# Patient Record
Sex: Female | Born: 1965 | Race: White | Hispanic: No | Smoking: Former smoker
Health system: Southern US, Community
[De-identification: ages and names within clinical notes are randomized; demographics above are authoritative.]

## PROBLEM LIST (undated history)

## (undated) DIAGNOSIS — A6000 Herpesviral infection of urogenital system, unspecified: Secondary | ICD-10-CM

## (undated) DIAGNOSIS — E079 Disorder of thyroid, unspecified: Secondary | ICD-10-CM

## (undated) DIAGNOSIS — C44319 Basal cell carcinoma of skin of other parts of face: Secondary | ICD-10-CM

## (undated) DIAGNOSIS — J302 Other seasonal allergic rhinitis: Secondary | ICD-10-CM

## (undated) DIAGNOSIS — M359 Systemic involvement of connective tissue, unspecified: Secondary | ICD-10-CM

## (undated) DIAGNOSIS — E039 Hypothyroidism, unspecified: Secondary | ICD-10-CM

## (undated) DIAGNOSIS — R55 Syncope and collapse: Secondary | ICD-10-CM

## (undated) HISTORY — PX: ABDOMINAL HYSTERECTOMY: SHX81

## (undated) HISTORY — PX: BLADDER REPAIR: SHX76

## (undated) HISTORY — DX: Basal cell carcinoma of skin of other parts of face: C44.319

## (undated) HISTORY — DX: Disorder of thyroid, unspecified: E07.9

## (undated) HISTORY — DX: Systemic involvement of connective tissue, unspecified: M35.9

## (undated) HISTORY — DX: Other seasonal allergic rhinitis: J30.2

## (undated) HISTORY — PX: TOOTH EXTRACTION: SUR596

## (undated) HISTORY — DX: Herpesviral infection of urogenital system, unspecified: A60.00

## (undated) HISTORY — DX: Syncope and collapse: R55

## (undated) HISTORY — PX: NASAL SEPTUM SURGERY: SHX37

---
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
## 1993-10-28 HISTORY — PX: BREAST ENHANCEMENT SURGERY: SHX7

## 1998-10-28 HISTORY — PX: AUGMENTATION MAMMAPLASTY: SUR837

## 2001-10-26 ENCOUNTER — Other Ambulatory Visit: Admission: RE | Admit: 2001-10-26 | Discharge: 2001-10-26 | Payer: Self-pay | Admitting: Family Medicine

## 2006-04-17 ENCOUNTER — Ambulatory Visit: Payer: Self-pay | Admitting: Otolaryngology

## 2007-11-16 ENCOUNTER — Ambulatory Visit: Payer: Self-pay | Admitting: Family Medicine

## 2008-03-29 ENCOUNTER — Ambulatory Visit: Payer: Self-pay | Admitting: Internal Medicine

## 2008-03-31 ENCOUNTER — Other Ambulatory Visit: Payer: Self-pay

## 2008-03-31 ENCOUNTER — Emergency Department: Payer: Self-pay | Admitting: Internal Medicine

## 2008-04-25 ENCOUNTER — Encounter: Admission: RE | Admit: 2008-04-25 | Discharge: 2008-04-25 | Payer: Self-pay | Admitting: Gastroenterology

## 2008-07-13 DIAGNOSIS — Z720 Tobacco use: Secondary | ICD-10-CM | POA: Insufficient documentation

## 2008-07-13 DIAGNOSIS — A6000 Herpesviral infection of urogenital system, unspecified: Secondary | ICD-10-CM | POA: Insufficient documentation

## 2008-09-09 DIAGNOSIS — F4320 Adjustment disorder, unspecified: Secondary | ICD-10-CM | POA: Insufficient documentation

## 2009-01-30 ENCOUNTER — Encounter: Payer: Self-pay | Admitting: Pulmonary Disease

## 2009-01-30 DIAGNOSIS — G4730 Sleep apnea, unspecified: Secondary | ICD-10-CM | POA: Insufficient documentation

## 2009-02-01 DIAGNOSIS — J329 Chronic sinusitis, unspecified: Secondary | ICD-10-CM | POA: Insufficient documentation

## 2009-02-10 DIAGNOSIS — M129 Arthropathy, unspecified: Secondary | ICD-10-CM | POA: Insufficient documentation

## 2009-08-28 DIAGNOSIS — E559 Vitamin D deficiency, unspecified: Secondary | ICD-10-CM | POA: Insufficient documentation

## 2009-08-28 DIAGNOSIS — E039 Hypothyroidism, unspecified: Secondary | ICD-10-CM | POA: Insufficient documentation

## 2009-09-18 ENCOUNTER — Ambulatory Visit: Payer: Self-pay | Admitting: Gastroenterology

## 2009-09-20 ENCOUNTER — Ambulatory Visit: Payer: Self-pay | Admitting: Gastroenterology

## 2009-10-28 HISTORY — PX: RECTOPEXY: SHX5700

## 2009-11-20 ENCOUNTER — Ambulatory Visit: Payer: Self-pay | Admitting: Gastroenterology

## 2010-02-05 ENCOUNTER — Ambulatory Visit: Payer: Self-pay | Admitting: Otolaryngology

## 2010-02-25 ENCOUNTER — Ambulatory Visit: Payer: Self-pay | Admitting: Otolaryngology

## 2010-03-06 ENCOUNTER — Ambulatory Visit: Payer: Self-pay | Admitting: Family Medicine

## 2010-10-28 HISTORY — PX: ANTERIOR CRUCIATE LIGAMENT REPAIR: SHX115

## 2010-11-29 NOTE — Miscellaneous (Signed)
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 

## 2011-05-15 ENCOUNTER — Ambulatory Visit: Payer: Self-pay | Admitting: Obstetrics and Gynecology

## 2011-05-16 ENCOUNTER — Ambulatory Visit: Payer: Self-pay | Admitting: Obstetrics and Gynecology

## 2011-05-21 LAB — PATHOLOGY REPORT

## 2011-10-29 HISTORY — PX: ENDOMETRIAL ABLATION: SHX621

## 2011-10-29 HISTORY — PX: CYSTOCELE REPAIR: SHX163

## 2011-10-29 HISTORY — PX: ARTHROSCOPIC REPAIR ACL: SUR80

## 2012-03-24 DIAGNOSIS — N393 Stress incontinence (female) (male): Secondary | ICD-10-CM | POA: Insufficient documentation

## 2012-06-09 ENCOUNTER — Ambulatory Visit: Payer: Self-pay

## 2012-10-28 HISTORY — PX: PARTIAL HYSTERECTOMY: SHX80

## 2013-10-28 HISTORY — PX: PARTIAL HYSTERECTOMY: SHX80

## 2014-04-11 ENCOUNTER — Emergency Department: Payer: Self-pay | Admitting: Emergency Medicine

## 2014-04-11 LAB — CBC WITH DIFFERENTIAL/PLATELET
Basophil #: 0 10*3/uL (ref 0.0–0.1)
Basophil %: 0.6 %
Eosinophil #: 0.1 10*3/uL (ref 0.0–0.7)
Eosinophil %: 1 %
HCT: 42.1 % (ref 35.0–47.0)
HGB: 14.3 g/dL (ref 12.0–16.0)
Lymphocyte #: 2.1 10*3/uL (ref 1.0–3.6)
Lymphocyte %: 26.9 %
MCH: 32 pg (ref 26.0–34.0)
MCHC: 33.9 g/dL (ref 32.0–36.0)
MCV: 94 fL (ref 80–100)
Monocyte #: 0.6 x10 3/mm (ref 0.2–0.9)
Monocyte %: 7.3 %
Neutrophil #: 4.9 10*3/uL (ref 1.4–6.5)
Neutrophil %: 64.2 %
Platelet: 289 10*3/uL (ref 150–440)
RBC: 4.46 10*6/uL (ref 3.80–5.20)
RDW: 12.5 % (ref 11.5–14.5)
WBC: 7.7 10*3/uL (ref 3.6–11.0)

## 2014-04-11 LAB — URINALYSIS, COMPLETE
Bilirubin,UR: NEGATIVE
Blood: NEGATIVE
Glucose,UR: NEGATIVE mg/dL (ref 0–75)
Ketone: NEGATIVE
Leukocyte Esterase: NEGATIVE
Nitrite: NEGATIVE
Ph: 8 (ref 4.5–8.0)
Protein: NEGATIVE
RBC,UR: NONE SEEN /HPF (ref 0–5)
Specific Gravity: 1.005 (ref 1.003–1.030)
Squamous Epithelial: <1
WBC UR: 1 /HPF (ref 0–5)

## 2014-04-11 LAB — COMPREHENSIVE METABOLIC PANEL
Albumin: 3.9 g/dL (ref 3.4–5.0)
Alkaline Phosphatase: 61 U/L
Anion Gap: 7 (ref 7–16)
BUN: 6 mg/dL — ABNORMAL LOW (ref 7–18)
Bilirubin,Total: 0.2 mg/dL (ref 0.2–1.0)
Calcium, Total: 8.9 mg/dL (ref 8.5–10.1)
Chloride: 104 mmol/L (ref 98–107)
Co2: 27 mmol/L (ref 21–32)
Creatinine: 0.63 mg/dL (ref 0.60–1.30)
EGFR (African American): >60
EGFR (Non-African Amer.): >60
Glucose: 78 mg/dL (ref 65–99)
Osmolality: 272 (ref 275–301)
Potassium: 3.8 mmol/L (ref 3.5–5.1)
SGOT(AST): 13 U/L — ABNORMAL LOW (ref 15–37)
SGPT (ALT): 20 U/L (ref 12–78)
Sodium: 138 mmol/L (ref 136–145)
Total Protein: 7.1 g/dL (ref 6.4–8.2)

## 2014-04-21 ENCOUNTER — Encounter: Payer: Self-pay | Admitting: Obstetrics and Gynecology

## 2014-05-10 ENCOUNTER — Encounter: Payer: Self-pay | Admitting: Obstetrics and Gynecology

## 2014-05-27 ENCOUNTER — Ambulatory Visit: Payer: Self-pay | Admitting: Gastroenterology

## 2014-05-28 ENCOUNTER — Encounter: Payer: Self-pay | Admitting: Obstetrics and Gynecology

## 2014-06-28 ENCOUNTER — Encounter: Payer: Self-pay | Admitting: Obstetrics and Gynecology

## 2014-08-31 DIAGNOSIS — K5909 Other constipation: Secondary | ICD-10-CM | POA: Insufficient documentation

## 2014-09-29 ENCOUNTER — Ambulatory Visit: Payer: Self-pay | Admitting: Gastroenterology

## 2014-11-24 ENCOUNTER — Ambulatory Visit: Payer: Self-pay | Admitting: Family Medicine

## 2015-04-19 ENCOUNTER — Ambulatory Visit (INDEPENDENT_AMBULATORY_CARE_PROVIDER_SITE_OTHER): Payer: 59 | Admitting: Family Medicine

## 2015-04-19 ENCOUNTER — Encounter: Payer: Self-pay | Admitting: Family Medicine

## 2015-04-19 VITALS — BP 103/69 | HR 69 | Resp 16 | Ht 62.0 in | Wt 134.0 lb

## 2015-04-19 DIAGNOSIS — R61 Generalized hyperhidrosis: Secondary | ICD-10-CM | POA: Diagnosis not present

## 2015-04-19 DIAGNOSIS — A6000 Herpesviral infection of urogenital system, unspecified: Secondary | ICD-10-CM | POA: Diagnosis not present

## 2015-04-19 DIAGNOSIS — G4700 Insomnia, unspecified: Secondary | ICD-10-CM

## 2015-04-19 DIAGNOSIS — F528 Other sexual dysfunction not due to a substance or known physiological condition: Secondary | ICD-10-CM | POA: Diagnosis not present

## 2015-04-19 NOTE — Assessment & Plan Note (Signed)
Not menopausal according to most recent test in January of this year. Will check other labs.

## 2015-04-19 NOTE — Progress Notes (Signed)
    Subjective:    Patient ID: Theresa Walton is a 49 y.o. female presenting with Hormone Issue  on 04/19/2015  HPI: Reports that she is feeling her hormones are out of wack.  She has had a TVH, Cystocele and rectocele repair. She reports loss of sleep, exhausted all day long. Increased sex drive. She is single x 4 years.denies hot flashes, but her core temperature is higher, but has night sweats which are not new. Previous endometrial ablation. Tender breasts x 5 years. Reports mood swings have been increasingly bad. Someone has told her to try compounded hormones. Reports normal [place] in 1/16. Normal TSH in 10/15.  Review of Systems  Constitutional: Negative for fever and chills.  Respiratory: Negative for shortness of breath.   Cardiovascular: Negative for chest pain.  Gastrointestinal: Negative for nausea, vomiting and abdominal pain.  Genitourinary: Negative for dysuria.  Skin: Negative for rash.  Psychiatric/Behavioral: Positive for sleep disturbance and decreased concentration.       Hypersexual      Objective:    BP 103/69 mmHg  Pulse 69  Resp 16  Ht 5\' 2"  (1.575 m)  Wt 134 lb (60.782 kg)  BMI 24.50 kg/m2 Physical Exam  Constitutional: She is oriented to person, place, and time. She appears well-developed and well-nourished. No distress.  HENT:  Head: Normocephalic and atraumatic.  Eyes: No scleral icterus.  Neck: Neck supple.  Cardiovascular: Normal rate.   Pulmonary/Chest: Effort normal.  Abdominal: Soft.  Neurological: She is alert and oriented to person, place, and time.  Skin: Skin is warm and dry.  Psychiatric: She has a normal mood and affect.        Assessment & Plan:   Problem List Items Addressed This Visit      Unprioritized   Herpes genitalis - Primary   Relevant Medications   valACYclovir (VALTREX) 500 MG tablet   Insomnia   Night sweats    Not menopausal according to most recent test in January of this year. Will check other labs.      Relevant Orders   Estradiol   Progesterone   Testosterone, Free, Total, SHBG   Hypersexuality state    Unclear etiolgy--will check hormones--if elevated would do pelvic sonogram.      Relevant Orders   Estradiol   Progesterone   Testosterone, Free, Total, SHBG     Return in about 4 weeks (around 05/17/2015) for a follow-up.   Precious Gilchrest S 04/19/2015 9:32 AM

## 2015-04-19 NOTE — Assessment & Plan Note (Signed)
Unclear etiolgy--will check hormones--if elevated would do pelvic sonogram.

## 2015-04-21 LAB — TESTOSTERONE, FREE, TOTAL, SHBG
Sex Hormone Binding: 108.1 nmol/L (ref 17.3–125.0)
Testosterone, Free: 0.5 pg/mL (ref 0.0–4.2)
Testosterone: <3 ng/dL — ABNORMAL LOW (ref 3–41)

## 2015-04-21 LAB — PROGESTERONE: Progesterone: 0.2 ng/mL

## 2015-04-21 LAB — ESTRADIOL: Estradiol: 73.6 pg/mL

## 2015-06-21 ENCOUNTER — Other Ambulatory Visit: Payer: Self-pay

## 2015-06-21 DIAGNOSIS — Z90710 Acquired absence of both cervix and uterus: Secondary | ICD-10-CM | POA: Insufficient documentation

## 2015-06-21 DIAGNOSIS — R031 Nonspecific low blood-pressure reading: Secondary | ICD-10-CM | POA: Insufficient documentation

## 2015-06-21 DIAGNOSIS — E538 Deficiency of other specified B group vitamins: Secondary | ICD-10-CM | POA: Insufficient documentation

## 2015-06-21 DIAGNOSIS — E162 Hypoglycemia, unspecified: Secondary | ICD-10-CM | POA: Insufficient documentation

## 2015-06-21 DIAGNOSIS — H539 Unspecified visual disturbance: Secondary | ICD-10-CM | POA: Insufficient documentation

## 2015-06-21 DIAGNOSIS — E781 Pure hyperglyceridemia: Secondary | ICD-10-CM | POA: Insufficient documentation

## 2015-06-21 DIAGNOSIS — N959 Unspecified menopausal and perimenopausal disorder: Secondary | ICD-10-CM | POA: Insufficient documentation

## 2015-06-21 DIAGNOSIS — R55 Syncope and collapse: Secondary | ICD-10-CM | POA: Insufficient documentation

## 2015-06-22 ENCOUNTER — Ambulatory Visit (INDEPENDENT_AMBULATORY_CARE_PROVIDER_SITE_OTHER): Payer: 59 | Admitting: Family Medicine

## 2015-06-22 ENCOUNTER — Encounter: Payer: Self-pay | Admitting: Family Medicine

## 2015-06-22 VITALS — BP 94/62 | HR 78 | Temp 98.1°F | Resp 16 | Wt 133.6 lb

## 2015-06-22 DIAGNOSIS — R5383 Other fatigue: Secondary | ICD-10-CM | POA: Diagnosis not present

## 2015-06-22 DIAGNOSIS — J309 Allergic rhinitis, unspecified: Secondary | ICD-10-CM

## 2015-06-22 NOTE — Progress Notes (Signed)
Subjective:     Patient ID: Theresa Walton, female   DOB: [birth year], 49 y.o.   MRN: [id number]  HPI  Chief Complaint  Patient presents with  . 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  . Sinusitis    two days ago.  States she has developed fatigue, malaise, dizziness, and now watery runny nose and right sided sinus congestion over the last week. States she is treating her allergy sx with Zyrtec, steroid nasal spray, and Neti Pot irrigation. Reports hx of remote sinus surgery x 3. Also followed by ENT, Dr. Richardson Landry. Has brought her biometric labs in for review.   Review of Systems  Constitutional: Negative for fever and chills.  HENT: Negative for sore throat.   Respiratory: Negative for cough and shortness of breath.   Cardiovascular: Negative for chest pain and palpitations.       Objective:   Physical Exam  Constitutional: She appears well-developed and well-nourished. No distress.  Ears: T.M's intact without inflammation Sinuses: non-tender Throat: no erythema, tonsils absent Neck: no cervical adenopathy Lungs: clear Heart: RRR without murmur    Assessment:    1. Allergic rhinitis, unspecified allergic rhinitis type: discussed this may also be early viral presentation   2. Other fatigue - CBC with Differential/Platelet - Renal function panel - T4, free - TSH - Vitamin D (25 hydroxy)    Plan:    Further f/u pending lab work. Discussed monitoring for further viral sx especially chest congestion and cough

## 2015-06-22 NOTE — Patient Instructions (Signed)
We will call you with the lab results. Watch for further viral symptoms.

## 2015-06-23 ENCOUNTER — Telehealth: Payer: Self-pay

## 2015-06-23 LAB — TSH: TSH: 2.85 u[IU]/mL (ref 0.450–4.500)

## 2015-06-23 LAB — RENAL FUNCTION PANEL
Albumin: 4.3 g/dL (ref 3.5–5.5)
BUN/Creatinine Ratio: 16 (ref 9–23)
BUN: 11 mg/dL (ref 6–24)
CO2: 22 mmol/L (ref 18–29)
Calcium: 9.3 mg/dL (ref 8.7–10.2)
Chloride: 100 mmol/L (ref 97–108)
Creatinine, Ser: 0.69 mg/dL (ref 0.57–1.00)
GFR calc Af Amer: 117 mL/min/{1.73_m2} (ref >59)
GFR calc non Af Amer: 102 mL/min/{1.73_m2} (ref >59)
Glucose: 88 mg/dL (ref 65–99)
Phosphorus: 3.4 mg/dL (ref 2.5–4.5)
Potassium: 4.5 mmol/L (ref 3.5–5.2)
Sodium: 139 mmol/L (ref 134–144)

## 2015-06-23 LAB — CBC WITH DIFFERENTIAL/PLATELET
Basophils Absolute: 0 10*3/uL (ref 0.0–0.2)
Basos: 1 %
EOS (ABSOLUTE): 0.1 10*3/uL (ref 0.0–0.4)
Eos: 2 %
Hematocrit: 38.7 % (ref 34.0–46.6)
Hemoglobin: 13.2 g/dL (ref 11.1–15.9)
Immature Grans (Abs): 0 10*3/uL (ref 0.0–0.1)
Immature Granulocytes: 0 %
Lymphocytes Absolute: 1.7 10*3/uL (ref 0.7–3.1)
Lymphs: 33 %
MCH: 32 pg (ref 26.6–33.0)
MCHC: 34.1 g/dL (ref 31.5–35.7)
MCV: 94 fL (ref 79–97)
Monocytes Absolute: 0.6 10*3/uL (ref 0.1–0.9)
Monocytes: 11 %
Neutrophils Absolute: 2.7 10*3/uL (ref 1.4–7.0)
Neutrophils: 53 %
Platelets: 229 10*3/uL (ref 150–379)
RBC: 4.12 x10E6/uL (ref 3.77–5.28)
RDW: 12.6 % (ref 12.3–15.4)
WBC: 5.1 10*3/uL (ref 3.4–10.8)

## 2015-06-23 LAB — T4, FREE: Free T4: 0.97 ng/dL (ref 0.82–1.77)

## 2015-06-23 LAB — VITAMIN D 25 HYDROXY (VIT D DEFICIENCY, FRACTURES): Vit D, 25-Hydroxy: 28.1 ng/mL — ABNORMAL LOW (ref 30.0–100.0)

## 2015-06-23 NOTE — Telephone Encounter (Signed)
Advised patient as below.  

## 2015-06-23 NOTE — Telephone Encounter (Signed)
-----   Message from Carmon Ginsberg, [place] sent at 06/23/2015  7:40 AM EDT ----- All of your labs are ok: No anemia, sugar or thyroid issues. Your Vitamin D is just minimally decreased (you may benefit from [id number] units daily in a supplement). Current symptoms may well be viral in nature.

## 2015-11-30 ENCOUNTER — Telehealth: Payer: Self-pay | Admitting: Family Medicine

## 2015-11-30 DIAGNOSIS — Z1239 Encounter for other screening for malignant neoplasm of breast: Secondary | ICD-10-CM

## 2015-11-30 DIAGNOSIS — Z1231 Encounter for screening mammogram for malignant neoplasm of breast: Secondary | ICD-10-CM | POA: Insufficient documentation

## 2015-11-30 NOTE — Telephone Encounter (Signed)
Ordered screening mammogram  Thanks,   -Mickel Baas

## 2015-12-04 ENCOUNTER — Ambulatory Visit
Admission: RE | Admit: 2015-12-04 | Discharge: 2015-12-04 | Disposition: A | Discharge status: Home / Self Care | Payer: 59 | Source: Ambulatory Visit | Attending: Family Medicine | Admitting: Family Medicine

## 2015-12-04 DIAGNOSIS — Z1231 Encounter for screening mammogram for malignant neoplasm of breast: Secondary | ICD-10-CM | POA: Diagnosis not present

## 2015-12-22 ENCOUNTER — Ambulatory Visit (INDEPENDENT_AMBULATORY_CARE_PROVIDER_SITE_OTHER): Payer: 59 | Admitting: Physician Assistant

## 2015-12-22 ENCOUNTER — Encounter: Payer: Self-pay | Admitting: Physician Assistant

## 2015-12-22 VITALS — BP 90/68 | HR 80 | Temp 98.3°F | Resp 16 | Wt 147.6 lb

## 2015-12-22 DIAGNOSIS — K1120 Sialoadenitis, unspecified: Secondary | ICD-10-CM | POA: Diagnosis not present

## 2015-12-22 NOTE — Patient Instructions (Signed)
Salivary Gland Infection °A salivary gland infection is an infection in one or more of the glands that produce spit (saliva). You have six major salivary glands. Each gland has a duct that carries saliva into your mouth. Saliva keeps your mouth moist and breaks down the food that you eat. It also helps to prevent tooth decay. °Two salivary glands are located just in front of your ears (parotid). The ducts for these glands open up inside your cheeks, near your back teeth. You also have two glands under your tongue (sublingual) and two glands under your jaw (submandibular). The ducts for these glands open under your tongue. Any salivary gland can become infected. Most infections occur in the parotid glands or submandibular glands. °CAUSES °Salivary glands can be infected by viruses or bacteria. °· The mumps virus is the most common cause of viral salivary gland infections, though mumps is now rare in many areas because of vaccination. °¨ This infection causes swelling in both parotid glands. °¨ Viral infections are more common in children. °· The bacteria that cause salivary gland infections are usually the same bacteria that normally live in your mouth. °¨ A stone can form in a salivary gland and block the flow of saliva. As a result, saliva backs up into the salivary gland. Bacteria may then start to grow behind the blockage and cause infection. °¨ Bacterial infections usually cause pain and swelling on one side of the face. Submandibular gland swelling occurs under the jaw. Parotid swelling occurs in front of the ear. °¨ Bacterial infections are more common in adults. °RISK FACTORS °Children who do not get the MMR (measles, mumps, rubella) vaccine are more likely to get mumps, which can cause a viral salivary gland infection. °Risk factors for bacterial infections include: °· Poor dental care (oral hygiene). °· Smoking. °· Not drinking enough water. °· Having a disease that causes dry mouth and dry eyes (Mikulicz  syndrome or Sjogren syndrome). °SIGNS AND SYMPTOMS °The main sign of salivary gland infection is a swollen salivary gland. This type of inflammation is often called sialadenitis. You may have swelling in front of your ear, under your jaw, or under your tongue. Swelling may get worse when you eat and decrease after you eat. Other signs and symptoms include: °· Pain. °· Tenderness. °· Redness. °· Dry mouth. °· Bad taste in your mouth. °· Difficulty chewing and swallowing. °· Fever. °DIAGNOSIS °Your health care provider may suspect a salivary gland infection based on your signs and symptoms. He or she will also do a physical exam. The health care provider will look and feel inside your mouth to see whether a stone is blocking a salivary gland duct. You may need to see an ear, nose, and throat specialist (ENT or otolaryngologist) for diagnosis and treatment. You may also need to have diagnostic tests, such as: °· An X-ray to check for a stone. °· Other imaging studies to look for an abscess and to rule out other causes of swelling. These tests may include: °¨ Ultrasound. °¨ CT scan. °¨ MRI. °· Culture and sensitivity test. This involves collecting a sample of pus for testing in the lab to see what bacteria grow and what antibiotics they are sensitive to. The testing sample may be: °¨ Swabbed from a salivary gland duct. °¨ Withdrawn from a swollen gland with a needle (aspiration). °TREATMENT °Viral salivary gland infections usually clear up without treatment. Bacterial infections are usually treated with antibiotic medicine. Severe infections that cause difficulty with swallowing may   be treated with an IV antibiotic in the hospital. Other treatments may include:  Probing and widening the salivary duct to allow a stone to pass. In some cases, a thin, flexible scope (endoscope) may be inserted into the duct to find a stone and remove it.  Breaking up a stone using sound waves.  Draining an infected gland (abscess)  with a needle.  In some cases, you may need surgery so your health care provider can:  Remove a stone.  Drain pus from an abscess.  Remove a badly infected gland. HOME CARE INSTRUCTIONS  Take medicines only as directed by your health care provider.  If you were prescribed an antibiotic medicine, finish it all even if you start to feel better.  Follow these instructions every few hours:  Suck on a lemon candy to stimulate the flow of saliva.  Put a warm compress over the gland.  Gently massage the gland.  Drink enough fluid to keep your urine clear or pale yellow.  Rinse your mouth with a mixture of warm water and salt every few hours. To make this mixture, add a pinch of salt to 1 cup of warm water.  Practice good oral hygiene by brushing and flossing your teeth after meals and before you go to bed.  Do not use any tobacco products, including cigarettes, chewing tobacco, or electronic cigarettes. If you need help quitting, ask your health care provider. SEEK MEDICAL CARE IF:  You have pain and swelling in your face, jaw, or mouth after eating.  You have persistent swelling in any of these places:  In front of your ear.  Under your jaw.  Inside your mouth. SEEK IMMEDIATE MEDICAL CARE IF:   You have pain and swelling in your face, jaw, or mouth that are getting worse.  Your pain and swelling make it hard to swallow or breathe.   This information is not intended to replace advice given to you by your health care provider. Make sure you discuss any questions you have with your health care provider.   Document Released: 11/21/2004 Document Revised: 11/04/2014 Document Reviewed: 03/16/2014 Elsevier Interactive Patient Education [employer].

## 2015-12-22 NOTE — Progress Notes (Signed)
Patient: Theresa Walton Female    DOB: 07/15/1966   50 y.o.   MRN: [id number] Visit Date: 12/22/2015  Today's Provider: Mar Daring, PA-C   Chief Complaint  Patient presents with  . Sore Throat   Subjective:    Sore Throat  This is a new problem. The current episode started in the past 7 days (Started on Monday). Neither side of throat is experiencing more pain than the other. There has been no fever. Associated symptoms include neck pain, swollen glands (for a week) and trouble swallowing. Pertinent negatives include no abdominal pain, congestion, coughing, ear discharge, headaches, plugged ear sensation, shortness of breath or vomiting. 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 She has tried gargles and oral narcotic analgesics (warm liquids) for the symptoms. The treatment provided no relief.  Patient went to the [facility] at [facility] and they did a rapid strep was negative culture was sent also and was negative (patient was informed today).Feeling very tired.     Allergies  Allergen Reactions  . Cefaclor Anaphylaxis  . Cephalosporins Anaphylaxis  . Lubiprostone     Other reaction(s): Angioedema Tongue Swelling x 2 times.   . Guaifenesin & Derivatives   . Pseudoephedrine     Tachycardia   Previous Medications   ASCORBIC ACID (VITAMIN C) 1000 MG TABLET    Take 1,000 mg by mouth daily.   B COMPLEX VITAMINS TABLET    Take 1 tablet by mouth daily. Reported on 12/22/2015   CETIRIZINE HCL (ZYRTEC ALLERGY) 10 MG CAPS    Take 1 capsule by mouth daily.   DYMISTA 137-50 MCG/ACT SUSP    instill 1 spray into each nostril twice a day   GLUCOS-CHOND-STEROL-FISH OIL (GLUCOSAMINE CHONDROITIN PLUS) CAPS    Take 2 capsules by mouth daily.   LORATADINE (CLARITIN) 10 MG TABLET    Take 10 mg by mouth daily. Reported on 12/22/2015   QUETIAPINE (SEROQUEL) 25 MG TABLET    take 1 tablet by mouth NIGHTLY FOR INSOMNIA   VALACYCLOVIR (VALTREX) 500 MG TABLET    Take 500 mg by  mouth 2 (two) times daily. Reported on 12/22/2015    Review of Systems  Constitutional: Positive for activity change (for all this week) and fatigue. Negative for fever.  HENT: Positive for postnasal drip, sinus pressure and trouble swallowing. Negative for congestion, ear discharge, rhinorrhea and sneezing.   Respiratory: Negative for cough, chest tightness and shortness of breath.   Cardiovascular: Negative for chest pain, palpitations and leg swelling.  Gastrointestinal: Negative for nausea, vomiting and abdominal pain.  Musculoskeletal: Positive for myalgias and neck pain.  Neurological: Negative for dizziness and headaches.    Social History  Substance Use Topics  . Smoking status: Former Smoker    Types: Cigarettes    Quit date: 05/15/2015  . Smokeless tobacco: Not on file  . Alcohol Use: No   Objective:   BP 90/68 mmHg  Pulse 80  Temp(Src) 98.3 F (36.8 C) (Oral)  Resp 16  Wt 147 lb 9.6 oz (66.951 kg)  Physical Exam  Constitutional: She appears well-developed and well-nourished. No distress.  HENT:  Head: Normocephalic and atraumatic.  Right Ear: Hearing, tympanic membrane, external ear and ear canal normal.  Left Ear: Hearing, tympanic membrane, external ear and ear canal normal.  Nose: Nose normal. Right sinus exhibits no maxillary sinus tenderness and no frontal sinus tenderness. Left sinus exhibits no maxillary sinus tenderness and no frontal sinus tenderness.  Mouth/Throat: Uvula is midline, oropharynx is clear and moist and mucous membranes are normal. No oropharyngeal exudate, posterior oropharyngeal edema or posterior oropharyngeal erythema.  Palpable and visible inflammation of sublingual salivary duct on the left side  Eyes: Conjunctivae are normal. Pupils are equal, round, and reactive to light. Right eye exhibits no discharge. Left eye exhibits no discharge. No scleral icterus.  Neck: Normal range of motion. Neck supple. No rigidity. No tracheal deviation, no  edema, no erythema and normal range of motion present. No thyromegaly present.  Mildly swollen submandibular salivary gland on the left with mild tenderness. No warmth, no redness.  Cardiovascular: Normal rate, regular rhythm and normal heart sounds.  Exam reveals no gallop and no friction rub.   No murmur heard. Pulmonary/Chest: Effort normal and breath sounds normal. No stridor. No respiratory distress. She has no wheezes. She has no rales.  Lymphadenopathy:    She has no cervical adenopathy.  Skin: Skin is warm and dry. She is not diaphoretic.  Vitals reviewed.       Assessment & Plan:     1. Sialadenitis Advised to start drinking lemon juice and/or sucking on sour candies. May use tylenol and/or IBU for fever and pain. She is to call the office on Monday if no improvement or if symptoms worsen. May consider imaging and referral to ENT if no improvement.       Mar Daring, PA-C  [facility]

## 2015-12-30 ENCOUNTER — Encounter: Payer: Self-pay | Admitting: Family Medicine

## 2015-12-30 ENCOUNTER — Ambulatory Visit (INDEPENDENT_AMBULATORY_CARE_PROVIDER_SITE_OTHER): Payer: 59 | Admitting: Family Medicine

## 2015-12-30 VITALS — BP 92/56 | HR 82 | Temp 98.2°F | Resp 16 | Wt 150.0 lb

## 2015-12-30 DIAGNOSIS — R5382 Chronic fatigue, unspecified: Secondary | ICD-10-CM

## 2015-12-30 DIAGNOSIS — R21 Rash and other nonspecific skin eruption: Secondary | ICD-10-CM

## 2015-12-30 DIAGNOSIS — M791 Myalgia, unspecified site: Secondary | ICD-10-CM

## 2015-12-30 DIAGNOSIS — R5383 Other fatigue: Secondary | ICD-10-CM | POA: Diagnosis not present

## 2015-12-30 LAB — POCT URINALYSIS DIPSTICK
Bilirubin, UA: NEGATIVE
Blood, UA: NEGATIVE
Glucose, UA: NEGATIVE
Ketones, UA: NEGATIVE
Leukocytes, UA: NEGATIVE
Nitrite, UA: NEGATIVE
Protein, UA: NEGATIVE
Spec Grav, UA: 1.02
Urobilinogen, UA: 0.2
pH, UA: 6

## 2015-12-30 MED ORDER — MELOXICAM 15 MG PO TABS: 15.0000 mg | ORAL_TABLET | Freq: Every day | ORAL | Status: DC

## 2015-12-30 NOTE — Progress Notes (Signed)
Patient: Theresa Walton Female    DOB: 04-30-1966   50 y.o.   MRN: [id number] Visit Date: 12/30/2015  Today's Provider: Lelon Huh, MD   Chief Complaint  Patient presents with  . Muscle Pain    x 2 days   Subjective:    Muscle Pain This is a new problem. Episode onset: 2 days ago. The problem is unchanged. The context of the pain is unknown. Pain location: shoulders, neck, feet and lower back. The pain is medium. Nothing aggravates the symptoms. Associated symptoms include constipation, fatigue, headaches, a rash (on neck an legs), stiffness and weakness. Pertinent negatives include no abdominal pain, chest pain, diarrhea, dysuria, eye pain, fever, nausea, shortness of breath, urinary symptoms, vaginal discharge, visual change, vomiting or wheezing. Past treatments include rest and OTC NSAID. The treatment provided mild relief.   She did have a sore throat a last week and had negative strep test at [facility]. The sore throat has since resolved, but had sudden onset of fatigue four days ago, followed by rash and generalized myalgia as above. No fevers. No cough. No similar symptoms in past.  She had been on Seroquel for a few months before she stopped it about 2 weeks ago due to weight gain.     Allergies  Allergen Reactions  . Cefaclor Anaphylaxis  . Cephalosporins Anaphylaxis  . Lubiprostone     Other reaction(s): Angioedema Tongue Swelling x 2 times.   . Guaifenesin & Derivatives   . Pseudoephedrine     Tachycardia   Previous Medications   ASCORBIC ACID (VITAMIN C) 1000 MG TABLET    Take 1,000 mg by mouth daily.   B COMPLEX VITAMINS TABLET    Take 1 tablet by mouth daily. Reported on 12/22/2015   CETIRIZINE HCL (ZYRTEC ALLERGY) 10 MG CAPS    Take 1 capsule by mouth daily.   DYMISTA 137-50 MCG/ACT SUSP    instill 1 spray into each nostril twice a day   GLUCOS-CHOND-STEROL-FISH OIL (GLUCOSAMINE CHONDROITIN PLUS) CAPS    Take 2 capsules by mouth daily.   LORATADINE  (CLARITIN) 10 MG TABLET    Take 10 mg by mouth daily. Reported on 12/22/2015   VALACYCLOVIR (VALTREX) 500 MG TABLET    Take 500 mg by mouth 2 (two) times daily. Reported on 12/22/2015    Review of Systems  Constitutional: Positive for fatigue. Negative for fever, chills, diaphoresis and appetite change.  Eyes: Negative for pain.  Respiratory: Negative for chest tightness, shortness of breath and wheezing.   Cardiovascular: Negative for chest pain and palpitations.  Gastrointestinal: Positive for constipation. Negative for nausea, vomiting, abdominal pain and diarrhea.  Genitourinary: Negative for dysuria and vaginal discharge.  Musculoskeletal: Positive for myalgias and stiffness.  Skin: Positive for rash (on neck an legs).  Neurological: Positive for weakness and headaches. Negative for dizziness.    Social History  Substance Use Topics  . Smoking status: Former Smoker    Types: Cigarettes    Quit date: 05/15/2015  . Smokeless tobacco: Not on file  . Alcohol Use: No   Objective:   BP 92/56 mmHg  Pulse 82  Temp(Src) 98.2 F (36.8 C) (Oral)  Resp 16  Wt 150 lb (68.04 kg)  SpO2 100%  Physical Exam  General Appearance:    Alert, cooperative, no distress  HENT:   bilateral TM normal without fluid or infection, neck without nodes, pharynx erythematous without exudate, sinuses nontender and nasal mucosa congested  Eyes:  PERRL, conjunctiva/corneas clear, EOM's intact       Lungs:     Clear to auscultation bilaterally, respirations unlabored  Heart:    Regular rate and rhythm  Neurologic:   Awake, alert, oriented x 3. No apparent focal neurological           defect.   MS:   Diffusely tender across tops of shoulders, upper arms and lower back. No joint tenderness or swelling. No erythema.   Skin:   Fine macular papular eruption chest arms and leg.    Results for orders placed or performed in visit on 12/30/15  POCT urinalysis dipstick  Result Value Ref Range   Color, UA yellow     Clarity, UA clear    Glucose, UA negative    Bilirubin, UA negative    Ketones, UA negative    Spec Grav, UA 1.020    Blood, UA negative    pH, UA 6.0    Protein, UA negative    Urobilinogen, UA 0.2    Nitrite, UA negative    Leukocytes, UA Negative Negative        Assessment & Plan:     1. Myalgia  - POCT urinalysis dipstick - CBC with Differential/Platelet - Comprehensive metabolic panel - CK Total (and CKMB) - Sed Rate (ESR) - Monospot - ANA w/Reflex - Antistreptolysin O titer - T4 AND TSH  2. Rash  - CBC with Differential/Platelet - Comprehensive metabolic panel - CK Total (and CKMB) - Sed Rate (ESR) - Monospot - ANA w/Reflex - Antistreptolysin O titer - T4 AND TSH  3. Chronic fatigue  - CBC with Differential/Platelet - Comprehensive metabolic panel - CK Total (and CKMB) - Sed Rate (ESR) - Monospot - ANA w/Reflex - Antistreptolysin O titer - T4 AND TSH  4. Other fatigue  - CBC with Differential/Platelet - Comprehensive metabolic panel - CK Total (and CKMB) - Sed Rate (ESR) - Monospot - ANA w/Reflex - Antistreptolysin O titer - T4 AND TSH  Considering recent sore throat this may be post-viral exanthem, or auto-immune reaction. She was prescribed prednisone by online healthcare provider, but has not yet started it. Meloxicam prescribed today and advised not to start prednisone until she gets her labs drawn on Monday.       Donald Fisher, MD  [facility] 

## 2016-01-02 LAB — COMPREHENSIVE METABOLIC PANEL
ALT: 11 IU/L (ref 0–32)
AST: 17 IU/L (ref 0–40)
Albumin/Globulin Ratio: 2 (ref 1.1–2.5)
Albumin: 4.1 g/dL (ref 3.5–5.5)
Alkaline Phosphatase: 55 IU/L (ref 39–117)
BUN/Creatinine Ratio: 17 (ref 9–23)
BUN: 11 mg/dL (ref 6–24)
Bilirubin Total: 0.3 mg/dL (ref 0.0–1.2)
CO2: 22 mmol/L (ref 18–29)
Calcium: 9 mg/dL (ref 8.7–10.2)
Chloride: 103 mmol/L (ref 96–106)
Creatinine, Ser: 0.64 mg/dL (ref 0.57–1.00)
GFR calc Af Amer: 119 mL/min/{1.73_m2} (ref >59)
GFR calc non Af Amer: 104 mL/min/{1.73_m2} (ref >59)
Globulin, Total: 2.1 g/dL (ref 1.5–4.5)
Glucose: 93 mg/dL (ref 65–99)
Potassium: 4 mmol/L (ref 3.5–5.2)
Sodium: 141 mmol/L (ref 134–144)
Total Protein: 6.2 g/dL (ref 6.0–8.5)

## 2016-01-02 LAB — CBC WITH DIFFERENTIAL/PLATELET
Basophils Absolute: 0 10*3/uL (ref 0.0–0.2)
Basos: 1 %
EOS (ABSOLUTE): 0.1 10*3/uL (ref 0.0–0.4)
Eos: 2 %
Hematocrit: 36 % (ref 34.0–46.6)
Hemoglobin: 12 g/dL (ref 11.1–15.9)
Immature Grans (Abs): 0 10*3/uL (ref 0.0–0.1)
Immature Granulocytes: 0 %
Lymphocytes Absolute: 1.6 10*3/uL (ref 0.7–3.1)
Lymphs: 38 %
MCH: 30.5 pg (ref 26.6–33.0)
MCHC: 33.3 g/dL (ref 31.5–35.7)
MCV: 91 fL (ref 79–97)
Monocytes Absolute: 0.4 10*3/uL (ref 0.1–0.9)
Monocytes: 9 %
Neutrophils Absolute: 2 10*3/uL (ref 1.4–7.0)
Neutrophils: 50 %
Platelets: 269 10*3/uL (ref 150–379)
RBC: 3.94 x10E6/uL (ref 3.77–5.28)
RDW: 12.3 % (ref 12.3–15.4)
WBC: 4.1 10*3/uL (ref 3.4–10.8)

## 2016-01-02 LAB — CK TOTAL AND CKMB (NOT AT ARMC)
CK-MB Index: 1.1 ng/mL (ref 0.0–5.3)
Total CK: 75 U/L (ref 24–173)

## 2016-01-02 LAB — T4 AND TSH
T4, Total: 6.7 ug/dL (ref 4.5–12.0)
TSH: 4.19 u[IU]/mL (ref 0.450–4.500)

## 2016-01-02 LAB — ANTISTREPTOLYSIN O TITER: ASO: 155 IU/mL (ref 0.0–200.0)

## 2016-01-02 LAB — ANA W/REFLEX: Anti Nuclear Antibody(ANA): NEGATIVE

## 2016-01-02 LAB — MONONUCLEOSIS SCREEN: Mono Screen: NEGATIVE

## 2016-01-02 LAB — SEDIMENTATION RATE: Sed Rate: 2 mm/hr (ref 0–40)

## 2016-01-08 ENCOUNTER — Encounter: Payer: Self-pay | Admitting: Family Medicine

## 2016-01-22 ENCOUNTER — Encounter: Payer: Self-pay | Admitting: Physician Assistant

## 2016-01-22 ENCOUNTER — Ambulatory Visit (INDEPENDENT_AMBULATORY_CARE_PROVIDER_SITE_OTHER): Payer: 59 | Admitting: Physician Assistant

## 2016-01-22 VITALS — BP 98/64 | HR 80 | Temp 98.4°F | Resp 16 | Ht 62.0 in | Wt 147.0 lb

## 2016-01-22 DIAGNOSIS — Z0000 Encounter for general adult medical examination without abnormal findings: Secondary | ICD-10-CM | POA: Diagnosis not present

## 2016-01-22 DIAGNOSIS — E559 Vitamin D deficiency, unspecified: Secondary | ICD-10-CM

## 2016-01-22 DIAGNOSIS — E039 Hypothyroidism, unspecified: Secondary | ICD-10-CM

## 2016-01-22 DIAGNOSIS — R5383 Other fatigue: Secondary | ICD-10-CM

## 2016-01-22 DIAGNOSIS — M6248 Contracture of muscle, other site: Secondary | ICD-10-CM

## 2016-01-22 DIAGNOSIS — B009 Herpesviral infection, unspecified: Secondary | ICD-10-CM

## 2016-01-22 DIAGNOSIS — M62838 Other muscle spasm: Secondary | ICD-10-CM

## 2016-01-22 DIAGNOSIS — K146 Glossodynia: Secondary | ICD-10-CM | POA: Diagnosis not present

## 2016-01-22 MED ORDER — VALACYCLOVIR HCL 500 MG PO TABS: 500.0000 mg | ORAL_TABLET | Freq: Two times a day (BID) | ORAL | Status: DC

## 2016-01-22 MED ORDER — CYCLOBENZAPRINE HCL 5 MG PO TABS: 5.0000 mg | ORAL_TABLET | Freq: Every day | ORAL | Status: DC

## 2016-01-22 MED ORDER — MAGIC MOUTHWASH W/LIDOCAINE: 5.0000 mL | Freq: Three times a day (TID) | ORAL | Status: DC | PRN

## 2016-01-22 NOTE — Progress Notes (Signed)
Patient ID: Theresa Walton, female   DOB: Feb 23, 1966, 50 y.o.   MRN: [id number]       Patient: Theresa Walton, Female    DOB: 08-27-1966, 50 y.o.   MRN: [id number] Visit Date: 01/22/2016  Today's Provider: Mar Daring, PA-C   Chief Complaint  Patient presents with  . Annual Exam   Subjective:    Annual physical exam Haevyn Dacosta is a 50 y.o. female who presents today for health maintenance and complete physical. She feels poorly. She reports exercising none. She reports she is sleeping poorly.She has been seen recently by Dr. Caryn Section with increased fatigue. Dr. Caryn Section did many labs trying to find a cause by all labs were within normal limits. She states that she has been having increased fatigue, dry, brittle nails, dry, brittle hair, weight gain (gained 20 pounds in 2 months secondary to Seroquel). She has since discontinued Seroquel and is currently not on any other medication at this time. She does follow-up in the next month or so to try to start new medication.  11/11/14 CPE 12/05/15 Mammogram-BI-RADS 1 05/11/14 Colonoscopy-WNL recheck in 10 yrs -----------------------------------------------------------------   Review of Systems  Constitutional: Positive for fatigue and unexpected weight change.  HENT: Positive for congestion, sinus pressure and tinnitus.   Eyes: Negative.   Respiratory: Negative.   Cardiovascular: Negative.   Gastrointestinal: Positive for abdominal distention.  Endocrine: Negative.   Genitourinary: Positive for frequency.  Musculoskeletal: Positive for myalgias, back pain, arthralgias, neck pain and neck stiffness.  Skin: Positive for rash.  Allergic/Immunologic: Positive for environmental allergies and food allergies.  Neurological: Positive for headaches.  Hematological: Negative.   Psychiatric/Behavioral: Positive for sleep disturbance.    Social History      She  reports that she quit smoking about 8 months ago. Her smoking use included  Cigarettes. She has never used smokeless tobacco. She reports that she does not drink alcohol or use illicit drugs.       Social History   Social History  . Marital Status: Divorced    Spouse Name: N/A  . Number of Children: N/A  . Years of Education: N/A   Social History Main Topics  . Smoking status: Former Smoker    Types: Cigarettes    Quit date: 05/15/2015  . Smokeless tobacco: Never Used  . Alcohol Use: No  . Drug Use: No  . Sexual Activity: No   Other Topics Concern  . None   Social History Narrative    Past Medical History  Diagnosis Date  . Genital herpes   . Seasonal allergies   . Connective tissue disorder [facility])      Patient Active Problem List   Diagnosis Date Noted  . Breast cancer screening 11/30/2015  . Allergic rhinitis 06/22/2015  . B12 deficiency 06/21/2015  . H/O: hysterectomy 06/21/2015  . Hypertriglyceridemia 06/21/2015  . Hypoglycemia 06/21/2015  . Arterial blood pressure decreased 06/21/2015  . Menopausal and perimenopausal disorder 06/21/2015  . Vasovagal symptom 06/21/2015  . Vision disturbance 06/21/2015  . Herpes genitalis 04/19/2015  . Insomnia 04/19/2015  . Night sweats 04/19/2015  . 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 Chronic constipation 08/31/2014  . Adult hypothyroidism 08/28/2009  . Avitaminosis D 08/28/2009  . Arthropathia 02/10/2009  . Chronic infection of sinus 02/01/2009  . Adaptation reaction 09/09/2008  . Genital herpes simplex type 1 infection 07/13/2008  . Current tobacco use 07/13/2008    Past Surgical History  Procedure Laterality Date  . Tonsilectomy, adenoidectomy, bilateral myringotomy and tubes  1984  .  Nasal septum surgery  1987, 1991, 2001  . Breast enhancement surgery  1995  . Rectopexy  2011  . Cystocele repair  2013  . Endometrial ablation  2013  . Arthroscopic repair acl  2013    Right knee  . Partial hysterectomy  2015  . Abdominal hysterectomy    . Augmentation mammaplasty Bilateral 2000     saline    Family History        Family Status  Relation Status Death Age  . Mother Deceased 24-Nov-2007  . Sister Alive   . Brother Alive   . Sister Alive         Her family history includes Healthy in her brother, sister, and sister; Heart disease in her father; Lung cancer in her paternal grandmother; Pulmonary fibrosis in her mother.    Allergies  Allergen Reactions  . Cefaclor Anaphylaxis  . Cephalosporins Anaphylaxis  . Lubiprostone     Other reaction(s): Angioedema Tongue Swelling x 2 times.   . Guaifenesin & Derivatives   . Pseudoephedrine     Tachycardia    Previous Medications   ASCORBIC ACID (VITAMIN C) 1000 MG TABLET    Take 1,000 mg by mouth daily.   B COMPLEX VITAMINS TABLET    Take 1 tablet by mouth daily. Reported on 12/22/2015   BIOTIN PO    Take 2,500 tablets by mouth 2 (two) times daily.   CETIRIZINE HCL (ZYRTEC ALLERGY) 10 MG CAPS    Take 1 capsule by mouth daily.   CHOLECALCIFEROL (VITAMIN D) 1000 UNITS TABLET    Take 2,000 Units by mouth daily.   DYMISTA 137-50 MCG/ACT SUSP    instill 1 spray into each nostril twice a day   GLUCOS-CHOND-STEROL-FISH OIL (GLUCOSAMINE CHONDROITIN PLUS) CAPS    Take 2 capsules by mouth daily.   LORATADINE (CLARITIN) 10 MG TABLET    Take 10 mg by mouth daily. Reported on 12/22/2015   MELOXICAM (MOBIC) 15 MG TABLET    Take 1 tablet (15 mg total) by mouth daily. Take with food   VALACYCLOVIR (VALTREX) 500 MG TABLET    Take 500 mg by mouth 2 (two) times daily. Reported on 12/22/2015    Patient Care Team: Margarita Rana, MD as PCP - General (Family Medicine)     Objective:   Vitals: BP 98/64 mmHg  Pulse 80  Temp(Src) 98.4 F (36.9 C) (Oral)  Resp 16  Ht 5\' 2"  (1.575 m)  Wt 147 lb (66.679 kg)  BMI 26.88 kg/m2   Physical Exam  Constitutional: She is oriented to person, place, and time. She appears well-developed and well-nourished. No distress.  HENT:  Head: Normocephalic and atraumatic.  Right Ear: Tympanic membrane,  external ear and ear canal normal.  Left Ear: Tympanic membrane, external ear and ear canal normal.  Nose: Nose normal.  Mouth/Throat: Uvula is midline, oropharynx is clear and moist and mucous membranes are normal. No oropharyngeal exudate.  Eyes: Conjunctivae, EOM and lids are normal. Pupils are equal, round, and reactive to light. Right eye exhibits no discharge. Left eye exhibits no discharge. No scleral icterus.  Neck: Trachea normal and normal range of motion. Neck supple. No JVD present. Muscular tenderness present. No spinous process tenderness present. Carotid bruit is not present. No tracheal deviation and normal range of motion present. No thyroid mass and no thyromegaly present.  Spasm noted in upper trapezius with left being greater than right.  Cardiovascular: Normal rate, regular rhythm, normal heart sounds and intact distal pulses.  Exam reveals no gallop and no friction rub.   No murmur heard. Pulmonary/Chest: Effort normal and breath sounds normal. No respiratory distress. She has no wheezes. She has no rales. She exhibits no tenderness.  Breast exam has already been done when she went to have her mammogram.  Abdominal: Soft. Normal appearance and bowel sounds are normal. She exhibits no distension and no mass. There is no hepatosplenomegaly. There is no tenderness. There is no rebound and no guarding.  Genitourinary: No breast swelling, tenderness or discharge.  Patient deferred due to recent hysterectomy with recent pelvic exam by her gynecologist.  Musculoskeletal: Normal range of motion. She exhibits no edema or tenderness.  Lymphadenopathy:    She has no cervical adenopathy.    She has no axillary adenopathy.  Neurological: She is alert and oriented to person, place, and time. She has normal strength. No cranial nerve deficit.  Skin: Skin is warm, dry and intact. No rash noted. She is not diaphoretic.  Psychiatric: She has a normal mood and affect. Her speech is normal and  behavior is normal. Judgment and thought content normal. Cognition and memory are normal.  Vitals reviewed.    Depression Screen PHQ 2/9 Scores 01/22/2016  PHQ - 2 Score 0      Assessment & Plan:     Routine Health Maintenance and Physical Exam  1. Annual physical exam Normal physical exam.  2. Other fatigue I will check labs as below to see if there may be a secondary cause to her increased fatigue. Over the last 7 months there was a increasing trend of her TSH thus I will recheck that. Her vitamin B12 and D were not checked with previous labs. I do question thyroid being cause of the symptoms as many do correlate. However, I also questioned the fast weight gain as a cause since she gained so much so quickly and she is the heaviest she has ever been. I will follow-up with her pending these lab results. - Thyroid Panel With TSH - B12 - Vitamin D (25 hydroxy)  3. Avitaminosis D History of vitamin D deficiency. She does take vitamin D supplement. She has been having increasing fatigue. We'll check labs as below to make sure this is not a cause. - Vitamin D (25 hydroxy)  4. Tongue sore Occasionally gets a sore tongue. Will get this magic mouthwash as below. She is to call if there is no improvement. - magic mouthwash w/lidocaine SOLN; Take 5 mLs by mouth 3 (three) times daily as needed for mouth pain. Swish and spit  Dispense: 120 mL; Refill: 0  5. Herpes simplex type 1 infection Stable. Patient only uses when she needs outbreak. Diagnosis was pulled for medication refill as below. Continue current medical treatment plan. - valACYclovir (VALTREX) 500 MG tablet; Take 1 tablet (500 mg total) by mouth 2 (two) times daily.  Dispense: 90 tablet; Refill: 1  6. Muscle spasms of neck She does have muscle spasms noted throughout the upper trapezius laterally with the left being greater than the right. I will give Flexeril as below for her to take at night to see if this helps with the  muscle spasms that she has. I do feel that this is secondary to stress and anxiety that her increased weight has caused her. - cyclobenzaprine (FLEXERIL) 5 MG tablet; Take 1-2 tablets (5-10 mg total) by mouth at bedtime.  Dispense: 30 tablet; Refill: 0  7. Hypothyroidism, unspecified hypothyroidism type Labs did reveal a continuing increasing trend  in TSH with TSH now being greater than 5. I will start her on a levothyroxin dose of 25 g. I will see her back in 4-6 weeks to see if she has noticed any symptom changes and to recheck her thyroid panel. - levothyroxine (SYNTHROID, LEVOTHROID) 25 MCG tablet; Take 1 tablet (25 mcg total) by mouth daily before breakfast.  Dispense: 30 tablet; Refill: 1   Exercise Activities and Dietary recommendations Goals    None      Immunization History  Administered Date(s) Administered  . Influenza-Unspecified 08/29/2015       --------------------------------------------------------------------

## 2016-01-22 NOTE — Patient Instructions (Signed)
Health Maintenance, Female Adopting a healthy lifestyle and getting preventive care can go a long way to promote health and wellness. Talk with your health care provider about what schedule of regular examinations is right for you. This is a good chance for you to check in with your provider about disease prevention and staying healthy. In between checkups, there are plenty of things you can do on your own. Experts have done a lot of research about which lifestyle changes and preventive measures are most likely to keep you healthy. Ask your health care provider for more information. WEIGHT AND DIET  Eat a healthy diet  Be sure to include plenty of vegetables, fruits, low-fat dairy products, and lean protein.  Do not eat a lot of foods high in solid fats, added sugars, or salt.  Get regular exercise. This is one of the most important things you can do for your health.  Most adults should exercise for at least 150 minutes each week. The exercise should increase your heart rate and make you sweat (moderate-intensity exercise).  Most adults should also do strengthening exercises at least twice a week. This is in addition to the moderate-intensity exercise.  Maintain a healthy weight  Body mass index (BMI) is a measurement that can be used to identify possible weight problems. It estimates body fat based on height and weight. Your health care provider can help determine your BMI and help you achieve or maintain a healthy weight.  For females 20 years of age and older:   A BMI below 18.5 is considered underweight.  A BMI of 18.5 to 24.9 is normal.  A BMI of 25 to 29.9 is considered overweight.  A BMI of 30 and above is considered obese.  Watch levels of cholesterol and blood lipids  You should start having your blood tested for lipids and cholesterol at 52 years of age, then have this test every 5 years.  You may need to have your cholesterol levels checked more often if:  Your lipid  or cholesterol levels are high.  You are older than 52 years of age.  You are at high risk for heart disease.  CANCER SCREENING   Lung Cancer  Lung cancer screening is recommended for adults 55-80 years old who are at high risk for lung cancer because of a history of smoking.  A yearly low-dose CT scan of the lungs is recommended for people who:  Currently smoke.  Have quit within the past 15 years.  Have at least a 30-pack-year history of smoking. A pack year is smoking an average of one pack of cigarettes a day for 1 year.  Yearly screening should continue until it has been 15 years since you quit.  Yearly screening should stop if you develop a health problem that would prevent you from having lung cancer treatment.  Breast Cancer  Practice breast self-awareness. This means understanding how your breasts normally appear and feel.  It also means doing regular breast self-exams. Let your health care provider know about any changes, no matter how small.  If you are in your 20s or 30s, you should have a clinical breast exam (CBE) by a health care provider every 1-3 years as part of a regular health exam.  If you are 40 or older, have a CBE every year. Also consider having a breast X-ray (mammogram) every year.  If you have a family history of breast cancer, talk to your health care provider about genetic screening.  If you   are at high risk for breast cancer, talk to your health care provider about having an MRI and a mammogram every year.  Breast cancer gene (BRCA) assessment is recommended for women who have family members with BRCA-related cancers. BRCA-related cancers include:  Breast.  Ovarian.  Tubal.  Peritoneal cancers.  Results of the assessment will determine the need for genetic counseling and BRCA1 and BRCA2 testing. Cervical Cancer Your health care provider may recommend that you be screened regularly for cancer of the pelvic organs (ovaries, uterus, and  vagina). This screening involves a pelvic examination, including checking for microscopic changes to the surface of your cervix (Pap test). You may be encouraged to have this screening done every 3 years, beginning at age 21.  For women ages 30-65, health care providers may recommend pelvic exams and Pap testing every 3 years, or they may recommend the Pap and pelvic exam, combined with testing for human papilloma virus (HPV), every 5 years. Some types of HPV increase your risk of cervical cancer. Testing for HPV may also be done on women of any age with unclear Pap test results.  Other health care providers may not recommend any screening for nonpregnant women who are considered low risk for pelvic cancer and who do not have symptoms. Ask your health care provider if a screening pelvic exam is right for you.  If you have had past treatment for cervical cancer or a condition that could lead to cancer, you need Pap tests and screening for cancer for at least 20 years after your treatment. If Pap tests have been discontinued, your risk factors (such as having a new sexual partner) need to be reassessed to determine if screening should resume. Some women have medical problems that increase the chance of getting cervical cancer. In these cases, your health care provider may recommend more frequent screening and Pap tests. Colorectal Cancer  This type of cancer can be detected and often prevented.  Routine colorectal cancer screening usually begins at 52 years of age and continues through 52 years of age.  Your health care provider may recommend screening at an earlier age if you have risk factors for colon cancer.  Your health care provider may also recommend using home test kits to check for hidden blood in the stool.  A small camera at the end of a tube can be used to examine your colon directly (sigmoidoscopy or colonoscopy). This is done to check for the earliest forms of colorectal  cancer.  Routine screening usually begins at age 50.  Direct examination of the colon should be repeated every 5-10 years through 52 years of age. However, you may need to be screened more often if early forms of precancerous polyps or small growths are found. Skin Cancer  Check your skin from head to toe regularly.  Tell your health care provider about any new moles or changes in moles, especially if there is a change in a mole's shape or color.  Also tell your health care provider if you have a mole that is larger than the size of a pencil eraser.  Always use sunscreen. Apply sunscreen liberally and repeatedly throughout the day.  Protect yourself by wearing long sleeves, pants, a wide-brimmed hat, and sunglasses whenever you are outside. HEART DISEASE, DIABETES, AND HIGH BLOOD PRESSURE   High blood pressure causes heart disease and increases the risk of stroke. High blood pressure is more likely to develop in:  People who have blood pressure in the high end   of the normal range (130-139/85-89 mm Hg).  People who are overweight or obese.  People who are African American.  If you are 38-23 years of age, have your blood pressure checked every 3-5 years. If you are 61 years of age or older, have your blood pressure checked every year. You should have your blood pressure measured twice--once when you are at a hospital or clinic, and once when you are not at a hospital or clinic. Record the average of the two measurements. To check your blood pressure when you are not at a hospital or clinic, you can use:  An automated blood pressure machine at a pharmacy.  A home blood pressure monitor.  If you are between 45 years and 39 years old, ask your health care provider if you should take aspirin to prevent strokes.  Have regular diabetes screenings. This involves taking a blood sample to check your fasting blood sugar level.  If you are at a normal weight and have a low risk for diabetes,  have this test once every three years after 52 years of age.  If you are overweight and have a high risk for diabetes, consider being tested at a younger age or more often. PREVENTING INFECTION  Hepatitis B  If you have a higher risk for hepatitis B, you should be screened for this virus. You are considered at high risk for hepatitis B if:  You were born in a country where hepatitis B is common. Ask your health care provider which countries are considered high risk.  Your parents were born in a high-risk country, and you have not been immunized against hepatitis B (hepatitis B vaccine).  You have HIV or AIDS.  You use needles to inject street drugs.  You live with someone who has hepatitis B.  You have had sex with someone who has hepatitis B.  You get hemodialysis treatment.  You take certain medicines for conditions, including cancer, organ transplantation, and autoimmune conditions. Hepatitis C  Blood testing is recommended for:  Everyone born from [birth year] through 1967.  Anyone with known risk factors for hepatitis C. Sexually transmitted infections (STIs)  You should be screened for sexually transmitted infections (STIs) including gonorrhea and chlamydia if:  You are sexually active and are younger than 52 years of age.  You are older than 53 years of age and your health care provider tells you that you are at risk for this type of infection.  Your sexual activity has changed since you were last screened and you are at an increased risk for chlamydia or gonorrhea. Ask your health care provider if you are at risk.  If you do not have HIV, but are at risk, it may be recommended that you take a prescription medicine daily to prevent HIV infection. This is called pre-exposure prophylaxis (PrEP). You are considered at risk if:  You are sexually active and do not regularly use condoms or know the HIV status of your partner(s).  You take drugs by injection.  You are sexually  active with a partner who has HIV. Talk with your health care provider about whether you are at high risk of being infected with HIV. If you choose to begin PrEP, you should first be tested for HIV. You should then be tested every 3 months for as long as you are taking PrEP.  PREGNANCY   If you are premenopausal and you may become pregnant, ask your health care provider about preconception counseling.  If you may  become pregnant, take 400 to 800 micrograms (mcg) of folic acid every day.  If you want to prevent pregnancy, talk to your health care provider about birth control (contraception). OSTEOPOROSIS AND MENOPAUSE   Osteoporosis is a disease in which the bones lose minerals and strength with aging. This can result in serious bone fractures. Your risk for osteoporosis can be identified using a bone density scan.  If you are 61 years of age or older, or if you are at risk for osteoporosis and fractures, ask your health care provider if you should be screened.  Ask your health care provider whether you should take a calcium or vitamin D supplement to lower your risk for osteoporosis.  Menopause may have certain physical symptoms and risks.  Hormone replacement therapy may reduce some of these symptoms and risks. Talk to your health care provider about whether hormone replacement therapy is right for you.  HOME CARE INSTRUCTIONS   Schedule regular health, dental, and eye exams.  Stay current with your immunizations.   Do not use any tobacco products including cigarettes, chewing tobacco, or electronic cigarettes.  If you are pregnant, do not drink alcohol.  If you are breastfeeding, limit how much and how often you drink alcohol.  Limit alcohol intake to no more than 1 drink per day for nonpregnant women. One drink equals 12 ounces of beer, 5 ounces of wine, or 1 ounces of hard liquor.  Do not use street drugs.  Do not share needles.  Ask your health care provider for help if  you need support or information about quitting drugs.  Tell your health care provider if you often feel depressed.  Tell your health care provider if you have ever been abused or do not feel safe at home.   This information is not intended to replace advice given to you by your health care provider. Make sure you discuss any questions you have with your health care provider.   Document Released: 04/29/2011 Document Revised: 11/04/2014 Document Reviewed: 09/15/2013 Elsevier Interactive Patient Education [employer].

## 2016-01-23 ENCOUNTER — Telehealth: Payer: Self-pay

## 2016-01-23 ENCOUNTER — Telehealth: Payer: Self-pay | Admitting: Family Medicine

## 2016-01-23 LAB — THYROID PANEL WITH TSH
Free Thyroxine Index: 1.7 (ref 1.2–4.9)
T3 Uptake Ratio: 29 % (ref 24–39)
T4, Total: 5.9 ug/dL (ref 4.5–12.0)
TSH: 5.34 u[IU]/mL — ABNORMAL HIGH (ref 0.450–4.500)

## 2016-01-23 LAB — VITAMIN B12: Vitamin B-12: 661 pg/mL (ref 211–946)

## 2016-01-23 LAB — VITAMIN D 25 HYDROXY (VIT D DEFICIENCY, FRACTURES): Vit D, 25-Hydroxy: 58.2 ng/mL (ref 30.0–100.0)

## 2016-01-23 MED ORDER — LEVOTHYROXINE SODIUM 25 MCG PO TABS: 25.0000 ug | ORAL_TABLET | Freq: Every day | ORAL | Status: DC

## 2016-01-23 NOTE — Telephone Encounter (Signed)
[facility] to fill the standard [facility] Mouthwash. Renaldo Fiddler, CMA

## 2016-01-23 NOTE — Telephone Encounter (Signed)
[id number]-KW 

## 2016-01-23 NOTE — Telephone Encounter (Signed)
Patient has been advised. KW 

## 2016-01-23 NOTE — Telephone Encounter (Signed)
Pt is returning call.  CB#[phone]/MW

## 2016-01-23 NOTE — Telephone Encounter (Signed)
-----   Message from Mar Daring, [place] sent at 01/23/2016  9:25 AM EDT ----- TSH is now elevated. All other labs are WNL. Lets start a thyroid replacement and recheck in 4-6 weeks with OV so I can see if symptoms are improving.

## 2016-01-23 NOTE — Telephone Encounter (Signed)
Theresa Walton with [facility] called to verify the Rx for magic mouthwash w/lidocaine SOLN.  CB#[phone]/MW

## 2016-01-26 ENCOUNTER — Other Ambulatory Visit: Payer: Self-pay | Admitting: Family Medicine

## 2016-01-26 DIAGNOSIS — A6000 Herpesviral infection of urogenital system, unspecified: Secondary | ICD-10-CM

## 2016-02-07 ENCOUNTER — Other Ambulatory Visit: Payer: Self-pay | Admitting: Physician Assistant

## 2016-02-07 DIAGNOSIS — A6000 Herpesviral infection of urogenital system, unspecified: Secondary | ICD-10-CM

## 2016-02-07 MED ORDER — VALACYCLOVIR HCL 500 MG PO TABS: 500.0000 mg | ORAL_TABLET | Freq: Every day | ORAL | Status: DC

## 2016-02-13 ENCOUNTER — Encounter: Payer: Self-pay | Admitting: Physician Assistant

## 2016-02-13 DIAGNOSIS — E039 Hypothyroidism, unspecified: Secondary | ICD-10-CM

## 2016-02-15 ENCOUNTER — Telehealth: Payer: Self-pay

## 2016-02-15 LAB — THYROID PANEL WITH TSH
Free Thyroxine Index: 2 (ref 1.2–4.9)
T3 Uptake Ratio: 30 % (ref 24–39)
T4, Total: 6.6 ug/dL (ref 4.5–12.0)
TSH: 2.66 u[IU]/mL (ref 0.450–4.500)

## 2016-02-15 NOTE — Telephone Encounter (Signed)
-----   Message from Mar Daring, PA-C sent at 02/15/2016  8:29 AM EDT ----- Thyroid panel now back WNL.

## 2016-02-15 NOTE — Telephone Encounter (Signed)
LMTCB  Thanks,  -Takela Varden 

## 2016-02-16 ENCOUNTER — Ambulatory Visit (INDEPENDENT_AMBULATORY_CARE_PROVIDER_SITE_OTHER): Payer: 59 | Admitting: Physician Assistant

## 2016-02-16 ENCOUNTER — Encounter: Payer: Self-pay | Admitting: Physician Assistant

## 2016-02-16 VITALS — BP 98/62 | HR 88 | Temp 98.2°F | Resp 16 | Wt 146.8 lb

## 2016-02-16 DIAGNOSIS — K140 Glossitis: Secondary | ICD-10-CM

## 2016-02-16 DIAGNOSIS — E039 Hypothyroidism, unspecified: Secondary | ICD-10-CM | POA: Diagnosis not present

## 2016-02-16 DIAGNOSIS — M6248 Contracture of muscle, other site: Secondary | ICD-10-CM | POA: Diagnosis not present

## 2016-02-16 DIAGNOSIS — M62838 Other muscle spasm: Secondary | ICD-10-CM

## 2016-02-16 MED ORDER — CYCLOBENZAPRINE HCL 5 MG PO TABS: 5.0000 mg | ORAL_TABLET | Freq: Every day | ORAL | Status: DC

## 2016-02-16 MED ORDER — LEVOTHYROXINE SODIUM 25 MCG PO TABS: 25.0000 ug | ORAL_TABLET | Freq: Every day | ORAL | Status: DC

## 2016-02-16 NOTE — Telephone Encounter (Signed)
Patient came for follow-up appointment today for Thyroid.  Thanks,  -Joreen Swearingin

## 2016-02-16 NOTE — Progress Notes (Signed)
Patient: Theresa Walton Female    DOB: 04-21-1966   50 y.o.   MRN: [id number] Visit Date: 02/16/2016  Today's Provider: Mar Daring, PA-C   Chief Complaint  Patient presents with  . Follow-up    Hypothyroidism   Subjective:    HPI   Hypothyroid, follow-up:  TSH  Date Value Ref Range Status  02/14/2016 2.660 0.450 - 4.500 uIU/mL Final  01/22/2016 5.340* 0.450 - 4.500 uIU/mL Final  01/01/2016 4.190 0.450 - 4.500 uIU/mL Final   Wt Readings from Last 3 Encounters:  02/16/16 146 lb 12.8 oz (66.588 kg)  01/22/16 147 lb (66.679 kg)  12/30/15 150 lb (68.04 kg)    She was last seen for hypothyroid 3 weeks ago.  Management since that visit includes Levothyroxine 25 MCG. She reports excellent compliance with treatment. She is not having side effects.  She trying to  exercise.She is experiencing change in energy level and skin problems, nail cheaping and wt change, constipation they are stating to get a little better. She denies diarrhea, heat / cold intolerance and palpitations Weight trend: stable  She also continues to have slight tongue swelling and scalloping on the edges. We tried [facility] magic mouthwash as this has worked for her sister in the past, but she did not see any response. All labs were WNL, with exception of thyroid which is now normal.  ------------------------------------------------------------------------      Allergies  Allergen Reactions  . Cefaclor Anaphylaxis  . Cephalosporins Anaphylaxis  . Lubiprostone     Other reaction(s): Angioedema Tongue Swelling x 2 times.   . Guaifenesin & Derivatives   . Pseudoephedrine     Tachycardia   Previous Medications   ASCORBIC ACID (VITAMIN C) 1000 MG TABLET    Take 1,000 mg by mouth daily.   B COMPLEX VITAMINS TABLET    Take 1 tablet by mouth daily. Reported on 12/22/2015   BIOTIN PO    Take 2,500 tablets by mouth 2 (two) times daily.   CETIRIZINE HCL (ZYRTEC ALLERGY) 10 MG CAPS    Take 1  capsule by mouth daily.   CHOLECALCIFEROL (VITAMIN D) 1000 UNITS TABLET    Take 2,000 Units by mouth daily.   CYCLOBENZAPRINE (FLEXERIL) 5 MG TABLET    Take 1-2 tablets (5-10 mg total) by mouth at bedtime.   DYMISTA 137-50 MCG/ACT SUSP    instill 1 spray into each nostril twice a day   GLUCOS-CHOND-STEROL-FISH OIL (GLUCOSAMINE CHONDROITIN PLUS) CAPS    Take 2 capsules by mouth daily.   LEVOTHYROXINE (SYNTHROID, LEVOTHROID) 25 MCG TABLET    Take 1 tablet (25 mcg total) by mouth daily before breakfast.   LORATADINE (CLARITIN) 10 MG TABLET    Take 10 mg by mouth daily. Reported on 12/22/2015   MAGIC MOUTHWASH W/LIDOCAINE SOLN    Take 5 mLs by mouth 3 (three) times daily as needed for mouth pain. Swish and spit   VALACYCLOVIR (VALTREX) 500 MG TABLET    Take 1 tablet (500 mg total) by mouth daily. X 3 days and repeat as needed.    Review of Systems  Constitutional: Positive for fatigue. Negative for chills.  HENT: Negative.   Respiratory: Negative.   Cardiovascular: Negative for chest pain, palpitations and leg swelling.       Her ankles have been getting swollen in the past couple of weeks and her hands also.  Gastrointestinal: Negative.   Endocrine: Positive for heat intolerance. Negative for cold intolerance.  Neurological: Negative.  Psychiatric/Behavioral: Negative.     Social History  Substance Use Topics  . Smoking status: Former Smoker    Types: Cigarettes    Quit date: 05/15/2015  . Smokeless tobacco: Never Used  . Alcohol Use: No   Objective:   BP 98/62 mmHg  Pulse 88  Temp(Src) 98.2 F (36.8 C) (Oral)  Resp 16  Wt 146 lb 12.8 oz (66.588 kg)  Physical Exam  Constitutional: She appears well-developed and well-nourished. No distress.  HENT:  Mouth/Throat: Uvula is midline, oropharynx is clear and moist and mucous membranes are normal. No oral lesions. No uvula swelling. No oropharyngeal exudate, posterior oropharyngeal edema or posterior oropharyngeal erythema.     Neck: Normal range of motion. Neck supple. No JVD present. No tracheal deviation present. No thyromegaly present.  Cardiovascular: Normal rate, regular rhythm and normal heart sounds.  Exam reveals no gallop and no friction rub.   No murmur heard. Pulmonary/Chest: Effort normal and breath sounds normal. No respiratory distress. She has no wheezes. She has no rales.  Lymphadenopathy:    She has no cervical adenopathy.  Skin: She is not diaphoretic.  Vitals reviewed.       Assessment & Plan:     1. Hypothyroidism, unspecified hypothyroidism type Stable. Continue current medical treatment plan of synthroid 60mcg. Will see her back in 6 months for recheck of thyroid panel. If stable may consider stopping synthroid and rechecking labs to see if needed to continue. Otherwise will continue or adjust if labs abnormal. She is to call the office if she develops any acute issue, question or concern. - levothyroxine (SYNTHROID, LEVOTHROID) 25 MCG tablet; Take 1 tablet (25 mcg total) by mouth daily before breakfast.  Dispense: 90 tablet; Refill: 1  2. Muscle spasms of neck Stable. Diagnosis pulled for medication refill. Continue current medical treatment plan. - cyclobenzaprine (FLEXERIL) 5 MG tablet; Take 1-2 tablets (5-10 mg total) by mouth at bedtime.  Dispense: 30 tablet; Refill: 5  3. Glossitis Unchanged. Will monitor to see if thyroid stabilizing helps decrease symptoms. B12 was WNL. If no improvement or if symptoms worsen will refer to ENT. Patient already sees Dr. Richardson Landry.       Mar Daring, PA-C  [facility]

## 2016-02-16 NOTE — Patient Instructions (Signed)

## 2016-02-19 MED ORDER — LEVOTHYROXINE SODIUM 25 MCG PO TABS: 25.0000 ug | ORAL_TABLET | Freq: Every day | ORAL | Status: DC

## 2016-02-19 NOTE — Addendum Note (Signed)
Addended by: Mar Daring on: 02/19/2016 04:58 PM   Modules accepted: Orders

## 2016-03-05 ENCOUNTER — Encounter: Payer: Self-pay | Admitting: *Deleted

## 2016-03-05 ENCOUNTER — Ambulatory Visit
Admission: EM | Admit: 2016-03-05 | Discharge: 2016-03-05 | Disposition: A | Discharge status: Home / Self Care | Payer: 59 | Attending: Family Medicine | Admitting: Family Medicine

## 2016-03-05 ENCOUNTER — Ambulatory Visit: Payer: 59 | Admitting: Family Medicine

## 2016-03-05 DIAGNOSIS — M62830 Muscle spasm of back: Secondary | ICD-10-CM

## 2016-03-05 MED ORDER — NAPROXEN 500 MG PO TABS: 500.0000 mg | ORAL_TABLET | Freq: Two times a day (BID) | ORAL | Status: DC

## 2016-03-05 MED ORDER — KETOROLAC TROMETHAMINE 60 MG/2ML IM SOLN
60.0000 mg | Freq: Once | INTRAMUSCULAR | Status: AC
  Administered 2016-03-05: 60 mg via INTRAMUSCULAR

## 2016-03-05 MED ORDER — METAXALONE 800 MG PO TABS: 800.0000 mg | ORAL_TABLET | Freq: Three times a day (TID) | ORAL | Status: DC

## 2016-03-05 MED ORDER — DIAZEPAM 2 MG PO TABS: 2.0000 mg | ORAL_TABLET | Freq: Three times a day (TID) | ORAL | Status: DC

## 2016-03-05 NOTE — ED Provider Notes (Signed)
CSN: [id number]     Arrival date & time 03/05/16  1359 History   First MD Initiated Contact with Patient 03/05/16 1434     Chief Complaint  Patient presents with  . Back Pain   (Consider location/radiation/quality/duration/timing/severity/associated sxs/prior Treatment) HPI   This a 50 year old female who presents with sudden onset of low back pain and spasm. This patient states that 3 days ago traveled in a car for 4 hours to see her father slept on her pullout sofa and had 4 hours trip back to [place]. She states that yesterday is when the pain began to bother her. She went to her chiropractor this morning and did not receive an adjustment but had local modalities performed. The chiropractor told her to come here for "a shot". She states that the pain is in her lower lumbar segments without any radiation. She denies any incontinence. Any motion is very painful for her.  Past Medical History  Diagnosis Date  . Genital herpes   . Seasonal allergies   . Connective tissue disorder [facility])    Past Surgical History  Procedure Laterality Date  . Tonsilectomy, adenoidectomy, bilateral myringotomy and tubes  1984  . Nasal septum surgery  1987, 1991, 2001  . Breast enhancement surgery  1995  . Rectopexy  2011  . Cystocele repair  2013  . Endometrial ablation  2013  . Arthroscopic repair acl  2013    Right knee  . Partial hysterectomy  2015  . Abdominal hysterectomy    . Augmentation mammaplasty Bilateral 2000    saline   Family History  Problem Relation Age of Onset  . Lung cancer Paternal Grandmother   . Heart disease Father   . Pulmonary fibrosis Mother   . Healthy Sister   . Healthy Brother   . Healthy Sister    Social History  Substance Use Topics  . Smoking status: Former Smoker    Types: Cigarettes    Quit date: 05/15/2015  . Smokeless tobacco: Never Used  . Alcohol Use: No   OB History    Gravida Para Term Preterm AB TAB SAB Ectopic Multiple Living   0 0 0 0 0 0 0 0 0 0       Review of Systems  Constitutional: Positive for activity change. Negative for fever, chills and fatigue.  Musculoskeletal: Positive for back pain.  All other systems reviewed and are negative.   Allergies  Cefaclor; Cephalosporins; Lubiprostone; Guaifenesin & derivatives; and Pseudoephedrine  Home Medications   Prior to Admission medications   Medication Sig Start Date End Date Taking? Authorizing Provider  ascorbic acid (VITAMIN C) 1000 MG tablet Take 1,000 mg by mouth daily.   Yes Historical Provider, MD  b complex vitamins tablet Take 1 tablet by mouth daily. Reported on 12/22/2015   Yes Historical Provider, MD  Cetirizine HCl (ZYRTEC ALLERGY) 10 MG CAPS Take 1 capsule by mouth daily.   Yes Historical Provider, MD  cholecalciferol (VITAMIN D) 1000 units tablet Take 2,000 Units by mouth daily.   Yes Historical Provider, MD  cyclobenzaprine (FLEXERIL) 5 MG tablet Take 1-2 tablets (5-10 mg total) by mouth at bedtime. 02/16/16  Yes Mar Daring, PA-C  DYMISTA 137-50 MCG/ACT SUSP instill 1 spray into each nostril twice a day 11/24/15  Yes Historical Provider, MD  Glucos-Chond-Sterol-Fish Oil (GLUCOSAMINE CHONDROITIN PLUS) CAPS Take 2 capsules by mouth daily.   Yes Historical Provider, MD  levothyroxine (SYNTHROID, LEVOTHROID) 25 MCG tablet Take 1 tablet (25 mcg total) by mouth daily  before breakfast. 02/16/16  Yes Mar Daring, PA-C  levothyroxine (SYNTHROID, LEVOTHROID) 25 MCG tablet Take 1 tablet (25 mcg total) by mouth daily before breakfast. 02/19/16  Yes Clearnce Sorrel Burnette, PA-C  loratadine (CLARITIN) 10 MG tablet Take 10 mg by mouth daily. Reported on 12/22/2015   Yes Historical Provider, MD  BIOTIN PO Take 2,500 tablets by mouth 2 (two) times daily.    Historical Provider, MD  diazepam (VALIUM) 2 MG tablet Take 1 tablet (2 mg total) by mouth 3 (three) times daily. 03/05/16   Lorin Picket, PA-C  magic mouthwash w/lidocaine SOLN Take 5 mLs by mouth 3 (three) times daily  as needed for mouth pain. Swish and spit Patient not taking: Reported on 02/16/2016 01/22/16   Mar Daring, PA-C  metaxalone (SKELAXIN) 800 MG tablet Take 1 tablet (800 mg total) by mouth 3 (three) times daily. 03/05/16   Lorin Picket, PA-C  naproxen (NAPROSYN) 500 MG tablet Take 1 tablet (500 mg total) by mouth 2 (two) times daily with a meal. 03/05/16   Lorin Picket, PA-C  valACYclovir (VALTREX) 500 MG tablet Take 1 tablet (500 mg total) by mouth daily. X 3 days and repeat as needed. 02/07/16   Mar Daring, PA-C   Meds Ordered and Administered this Visit   Medications  ketorolac (TORADOL) injection 60 mg (60 mg Intramuscular Given 03/05/16 1502)    BP 116/67 mmHg  Pulse 85  Temp(Src) 97.5 F (36.4 C) (Oral)  Resp 16  Ht 5\' 2"  (1.575 m)  Wt 145 lb (65.772 kg)  BMI 26.51 kg/m2  SpO2 100% No data found.   Physical Exam  Constitutional: She is oriented to person, place, and time. She appears well-developed and well-nourished. No distress.  HENT:  Head: Normocephalic and atraumatic.  Eyes: Conjunctivae are normal. Pupils are equal, round, and reactive to light.  Neck: Normal range of motion. Neck supple.  Musculoskeletal: She exhibits tenderness.  Examination lumbar spine shows a level pelvis in stance. Forward flexion and lateral flexion she has marked blunting of the lumbar spine now with no movement. Is no reversal. There is no tenderness with her standing. An attempt to have her lie down she had the severe pain and was unable to fully become recumbent. She is able to toe and heel walk. Sensation distally is intact to light touch throughout. DTRs are 2+ over 4 symmetrical. EHL peroneal and anterior tibialis are all intact. Her leg raise testing is intact to 90 in the sitting position bilaterally  Neurological: She is alert and oriented to person, place, and time. She has normal reflexes. She displays normal reflexes. She exhibits normal muscle tone.  Skin: Skin is  warm and dry. She is not diaphoretic.  Psychiatric: She has a normal mood and affect. Her behavior is normal. Judgment and thought content normal.  Nursing note and vitals reviewed.   ED Course  Procedures (including critical care time)  Labs Review Labs Reviewed - No data to display  Imaging Review No results found.   Visual Acuity Review  Right Eye Distance:   Left Eye Distance:   Bilateral Distance:    Right Eye Near:   Left Eye Near:    Bilateral Near:      Medications  ketorolac (TORADOL) injection 60 mg (60 mg Intramuscular Given 03/05/16 1502)     MDM   1. Spasm of lumbar paraspinous muscle    Discharge Medication List as of 03/05/2016  3:32 PM  START taking these medications   Details  diazepam (VALIUM) 2 MG tablet Take 1 tablet (2 mg total) by mouth 3 (three) times daily., Starting 03/05/2016, Until Discontinued, Print    metaxalone (SKELAXIN) 800 MG tablet Take 1 tablet (800 mg total) by mouth 3 (three) times daily., Starting 03/05/2016, Until Discontinued, Normal    naproxen (NAPROSYN) 500 MG tablet Take 1 tablet (500 mg total) by mouth 2 (two) times daily with a meal., Starting 03/05/2016, Until Discontinued, Normal      Plan: 1. Test/x-ray results and diagnosis reviewed with patient 2. rx as per orders; risks, benefits, potential side effects reviewed with patient 3. Recommend supportive treatment with Symptom avoidance and rest. She may continue to see her chiropractor but if it is not improving I recommend that she return to her primary care physician for referral to physical therapy. She has a significant amount of spasm in her back on physical exam and I will start her on some Valium. I've cautioned her regarding use of Valium and Skelaxin and performing activities are required judgment and concentration. She should not drive while taking these medications. She will start with the Valium and is finished and switch over to the Skelaxin. Will keep her from  working today and tomorrow but may return to work on Thursday. 4. F/u prn if symptoms worsen or don't improve      Lorin Picket, PA-C 03/05/16 1708  Lorin Picket, PA-C 03/05/16 2040

## 2016-03-05 NOTE — ED Notes (Signed)
Low back pain, onset yesterday. Pt seen at chiropractor this am without relief. Denies injury or previous hx.

## 2016-03-05 NOTE — Discharge Instructions (Signed)
Heat Therapy Heat therapy can help ease sore, stiff, injured, and tight muscles and joints. Heat relaxes your muscles, which may help ease your pain.  RISKS AND COMPLICATIONS If you have any of the following conditions, do not use heat therapy unless your health care provider has approved:  Poor circulation.  Healing wounds or scarred skin in the area being treated.  Diabetes, heart disease, or high blood pressure.  Not being able to feel (numbness) the area being treated.  Unusual swelling of the area being treated.  Active infections.  Blood clots.  Cancer.  Inability to communicate pain. This may include young children and people who have problems with their brain function (dementia).  Pregnancy. Heat therapy should only be used on old, pre-existing, or long-lasting (chronic) injuries. Do not use heat therapy on new injuries unless directed by your health care provider. HOW TO USE HEAT THERAPY There are several different kinds of heat therapy, including:  Moist heat pack.  Warm water bath.  Hot water bottle.  Electric heating pad.  Heated gel pack.  Heated wrap.  Electric heating pad. Use the heat therapy method suggested by your health care provider. Follow your health care provider's instructions on when and how to use heat therapy. GENERAL HEAT THERAPY RECOMMENDATIONS  Do not sleep while using heat therapy. Only use heat therapy while you are awake.  Your skin may turn pink while using heat therapy. Do not use heat therapy if your skin turns red.  Do not use heat therapy if you have new pain.  High heat or long exposure to heat can cause burns. Be careful when using heat therapy to avoid burning your skin.  Do not use heat therapy on areas of your skin that are already irritated, such as with a rash or sunburn. SEEK MEDICAL CARE IF:  You have blisters, redness, swelling, or numbness.  You have new pain.  Your pain is worse. MAKE SURE  YOU:  Understand these instructions.  Will watch your condition.  Will get help right away if you are not doing well or get worse.   This information is not intended to replace advice given to you by your health care provider. Make sure you discuss any questions you have with your health care provider.   Document Released: 01/06/2012 Document Revised: 11/04/2014 Document Reviewed: 12/07/2013 Elsevier Interactive Patient Education 2016 [place] Injury Prevention Back injuries can be very painful. They can also be difficult to heal. After having one back injury, you are more likely to injure your back again. It is important to learn how to avoid injuring or re-injuring your back. The following tips can help you to prevent a back injury. WHAT SHOULD I KNOW ABOUT PHYSICAL FITNESS?  Exercise for 30 minutes per day on most days of the week or as directed by your health care provider. Make sure to:  Do aerobic exercises, such as walking, jogging, biking, or swimming.  Do exercises that increase balance and strength, such as tai chi and yoga. These can decrease your risk of falling and injuring your back.  Do stretching exercises to help with flexibility.  Try to develop strong abdominal muscles. Your abdominal muscles provide a lot of the support that is needed by your back.  Maintain a healthy weight. This helps to decrease your risk of a back injury. WHAT SHOULD I KNOW ABOUT MY DIET?  Talk with your health care provider about your overall diet. Take supplements and vitamins only as directed  by your health care provider.  Talk with your health care provider about how much calcium and vitamin D you need each day. These nutrients help to prevent weakening of the bones (osteoporosis). Osteoporosis can cause broken (fractured) bones, which lead to back pain.  Include good sources of calcium in your diet, such as dairy products, green leafy vegetables, and products that have had  calcium added to them (fortified).  Include good sources of vitamin D in your diet, such as milk and foods that are fortified with vitamin D. WHAT SHOULD I KNOW ABOUT MY POSTURE?  Sit up straight and stand up straight. Avoid leaning forward when you sit or hunching over when you stand.  Choose chairs that have good low-back (lumbar) support.  If you work at a desk, sit close to it so you do not need to lean over. Keep your chin tucked in. Keep your neck drawn back, and keep your elbows bent at a right angle. Your arms should look like the letter "L."  Sit high and close to the steering wheel when you drive. Add a lumbar support to your car seat, if needed.  Avoid sitting or standing in one position for very long. Take breaks to get up, stretch, and walk around at least one time every hour. Take breaks every hour if you are driving for long periods of time.  Sleep on your side with your knees slightly bent, or sleep on your back with a pillow under your knees. Do not lie on the front of your body to sleep. WHAT SHOULD I KNOW ABOUT LIFTING, TWISTING, AND REACHING? Lifting and Heavy Lifting  Avoid heavy lifting, especially repetitive heavy lifting. If you must do heavy lifting:  Stretch before lifting.  Work slowly.  Rest between lifts.  Use a tool such as a cart or a dolly to move objects if one is available.  Make several small trips instead of carrying one heavy load.  Ask for help when you need it, especially when moving big objects.  Follow these steps when lifting:  Stand with your feet shoulder-width apart.  Get as close to the object as you can. Do not try to pick up a heavy object that is far from your body.  Use handles or lifting straps if they are available.  Bend at your knees. Squat down, but keep your heels off the floor.  Keep your shoulders pulled back, your chin tucked in, and your back straight.  Lift the object slowly while you tighten the muscles in your  legs, abdomen, and buttocks. Keep the object as close to the center of your body as possible.  Follow these steps when putting down a heavy load:  Stand with your feet shoulder-width apart.  Lower the object slowly while you tighten the muscles in your legs, abdomen, and buttocks. Keep the object as close to the center of your body as possible.  Keep your shoulders pulled back, your chin tucked in, and your back straight.  Bend at your knees. Squat down, but keep your heels off the floor.  Use handles or lifting straps if they are available. Twisting and Reaching  Avoid lifting heavy objects above your waist.  Do not twist at your waist while you are lifting or carrying a load. If you need to turn, move your feet.  Do not bend over without bending at your knees.  Avoid reaching over your head, across a table, or for an object on a high surface. WHAT ARE  SOME OTHER TIPS?  Avoid wet floors and icy ground. Keep sidewalks clear of ice to prevent falls.  Do not sleep on a mattress that is too soft or too hard.  Keep items that are used frequently within easy reach.  Put heavier objects on shelves at waist level, and put lighter objects on lower or higher shelves.  Find ways to decrease your stress, such as exercise, massage, or relaxation techniques. Stress can build up in your muscles. Tense muscles are more vulnerable to injury.  Talk with your health care provider if you feel anxious or depressed. These conditions can make back pain worse.  Wear flat heel shoes with cushioned soles.  Avoid sudden movements.  Use both shoulder straps when carrying a backpack.  Do not use any tobacco products, including cigarettes, chewing tobacco, or electronic cigarettes. If you need help quitting, ask your health care provider.   This information is not intended to replace advice given to you by your health care provider. Make sure you discuss any questions you have with your health care  provider.   Document Released: 11/21/2004 Document Revised: 02/28/2015 Document Reviewed: 10/18/2014 Elsevier Interactive Patient Education [employer].

## 2016-03-14 ENCOUNTER — Encounter: Payer: 59 | Admitting: Family Medicine

## 2016-05-15 ENCOUNTER — Encounter: Payer: Self-pay | Admitting: Physician Assistant

## 2016-05-15 ENCOUNTER — Ambulatory Visit (INDEPENDENT_AMBULATORY_CARE_PROVIDER_SITE_OTHER): Payer: 59 | Admitting: Physician Assistant

## 2016-05-15 VITALS — BP 102/70 | HR 71 | Temp 98.4°F | Resp 16 | Wt 144.6 lb

## 2016-05-15 DIAGNOSIS — G479 Sleep disorder, unspecified: Secondary | ICD-10-CM | POA: Diagnosis not present

## 2016-05-15 DIAGNOSIS — G250 Essential tremor: Secondary | ICD-10-CM | POA: Diagnosis not present

## 2016-05-15 DIAGNOSIS — E039 Hypothyroidism, unspecified: Secondary | ICD-10-CM | POA: Diagnosis not present

## 2016-05-15 MED ORDER — LEVOTHYROXINE SODIUM 50 MCG PO TABS: 50.0000 ug | ORAL_TABLET | Freq: Every day | ORAL | Status: DC

## 2016-05-15 NOTE — Patient Instructions (Addendum)
Essential Tremor A tremor is trembling or shaking that you cannot control. Most tremors affect the hands or arms. Tremors can also affect the head, vocal cords, face, and other parts of the body.  Essential tremor is a tremor without a known cause.  CAUSES Essential tremor has no known cause.  RISK FACTORS You may be at greater risk of essential tremor if:   You have a family member with essential tremor.   You are age 52 or older.   You take certain medicines. SIGNS AND SYMPTOMS The main sign of a tremor is uncontrolled and unintentional rhythmic shaking of a body part.  You may have difficulty eating with a spoon or fork.   You may have difficulty writing.   You may nod your head up and down or side to side.   You may have a quivering voice.  Your tremors:  May get worse over time.   May come and go.   May be more noticeable on one side of your body.   May get worse due to stress, fatigue, caffeine, and extreme heat or cold.  DIAGNOSIS Your health care provider can diagnose essential tremor based on your symptoms, medical history, and a physical examination. There is no single test to diagnose an essential tremor. However, your health care provider may perform a variety of tests to rule out other conditions. Tests may include:   Blood and urine tests.   Imaging studies of your brain, such as:   CT scan.   MRI.   A test that measures involuntary muscle movement (electromyogram). TREATMENT Your tremors may go away without treatment. Mild tremors may not need treatment if they do not affect your day-to-day life. Severe tremors may need to be treated using one or a combination of the following options:   Medicines. This may include medicine that is injected.  Lifestyle changes.   Physical therapy.  HOME CARE INSTRUCTIONS  Take medicines only as directed by your health care provider.   Limit alcohol intake to no more than 1 drink per day for  nonpregnant women and 2 drinks per day for men. One drink equals 12 oz of beer, 5 oz of wine, or 1 oz of hard liquor.  Do not use any tobacco products, including cigarettes, chewing tobacco, or electronic cigarettes. If you need help quitting, ask your health care provider.  Take medicines only as directed by your health care provider.   Avoid extreme heat or cold.   Limit the amount of caffeine you consumeas directed by your health care provider.   Try to get eight hours of sleep each night.  Find ways to manage your stress, such as meditation or yoga.  Keep all follow-up visits as directed by your health care provider. This is important. This includes any physical therapy visits. SEEK MEDICAL CARE IF:  You experience any changes in the location or intensity of your tremors.   You start having a tremor after starting a new medicine.   You have tremor with other symptoms such as:   Numbness.   Tingling.   Pain.   Weakness.   Your tremor gets worse.   Your tremor interferes with your daily life.    This information is not intended to replace advice given to you by your health care provider. Make sure you discuss any questions you have with your health care provider.   Document Released: 11/04/2014 Document Reviewed: 11/04/2014 Elsevier Interactive Patient Education 2016 [employer]. Levothyroxine tablets What is  this medicine? LEVOTHYROXINE (lee voe thye ROX een) is a thyroid hormone. This medicine can improve symptoms of thyroid deficiency such as slow speech, lack of energy, weight gain, hair loss, dry skin, and feeling cold. It also helps to treat goiter (an enlarged thyroid gland). It is also used to treat some kinds of thyroid cancer along with surgery and other medicines. This medicine may be used for other purposes; ask your health care provider or pharmacist if you have questions. What should I tell my health care provider before I take this  medicine? They need to know if you have any of these conditions: -angina -blood clotting problems -diabetes -dieting or on a weight loss program -fertility problems -heart disease -high levels of thyroid hormone -pituitary gland problem -previous heart attack -an unusual or allergic reaction to levothyroxine, thyroid hormones, other medicines, foods, dyes, or preservatives -pregnant or trying to get pregnant -breast-feeding How should I use this medicine? Take this medicine by mouth with plenty of water. It is best to take on an empty stomach, at least 30 minutes before or 2 hours after food. Follow the directions on the prescription label. Take at the same time each day. Do not take your medicine more often than directed. Contact your pediatrician regarding the use of this medicine in children. While this drug may be prescribed for children and infants as young as a few days of age for selected conditions, precautions do apply. For infants, you may crush the tablet and place in a small amount of (5-10 ml or 1 to 2 teaspoonfuls) of water, breast milk, or non-soy based infant formula. Do not mix with soy-based infant formula. Give as directed. Overdosage: If you think you have taken too much of this medicine contact a poison control center or emergency room at once. NOTE: This medicine is only for you. Do not share this medicine with others. What if I miss a dose? If you miss a dose, take it as soon as you can. If it is almost time for your next dose, take only that dose. Do not take double or extra doses. What may interact with this medicine? -amiodarone -antacids -anti-thyroid medicines -calcium supplements -carbamazepine -cholestyramine -colestipol -digoxin -female hormones, including contraceptive or birth control pills -iron supplements -ketamine -liquid nutrition products like Ensure -medicines for colds and breathing difficulties -medicines for diabetes -medicines for mental  depression -medicines or herbals used to decrease weight or appetite -phenobarbital or other barbiturate medications -phenytoin -prednisone or other corticosteroids -rifabutin -rifampin -soy isoflavones -sucralfate -theophylline -warfarin This list may not describe all possible interactions. Give your health care provider a list of all the medicines, herbs, non-prescription drugs, or dietary supplements you use. Also tell them if you smoke, drink alcohol, or use illegal drugs. Some items may interact with your medicine. What should I watch for while using this medicine? Be sure to take this medicine with plenty of fluids. Some tablets may cause choking, gagging, or difficulty swallowing from the tablet getting stuck in your throat. Most of these problems disappear if the medicine is taken with the right amount of water or other fluids. Do not switch brands of this medicine unless your health care professional agrees with the change. Ask questions if you are uncertain. You will need regular exams and occasional blood tests to check the response to treatment. If you are receiving this medicine for an underactive thyroid, it may be several weeks before you notice an improvement. Check with your doctor or health care professional  if your symptoms do not improve. It may be necessary for you to take this medicine for the rest of your life. Do not stop using this medicine unless your doctor or health care professional advises you to. This medicine can affect blood sugar levels. If you have diabetes, check your blood sugar as directed. You may lose some of your hair when you first start treatment. With time, this usually corrects itself. If you are going to have surgery, tell your doctor or health care professional that you are taking this medicine. What side effects may I notice from receiving this medicine? Side effects that you should report to your doctor or health care professional as soon as  possible: -allergic reactions like skin rash, itching or hives, swelling of the face, lips, or tongue -chest pain -excessive sweating or intolerance to heat -fast or irregular heartbeat -nervousness -skin rash or hives -swelling of ankles, feet, or legs -tremors Side effects that usually do not require medical attention (report to your doctor or health care professional if they continue or are bothersome): -changes in appetite -changes in menstrual periods -diarrhea -hair loss -headache -trouble sleeping -weight loss This list may not describe all possible side effects. Call your doctor for medical advice about side effects. You may report side effects to FDA at 1-800-FDA-1088. Where should I keep my medicine? Keep out of the reach of children. Store at room temperature between 15 and 30 degrees C (59 and 86 degrees F). Protect from light and moisture. Keep container tightly closed. Throw away any unused medicine after the expiration date. NOTE: This sheet is a summary. It may not cover all possible information. If you have questions about this medicine, talk to your doctor, pharmacist, or health care provider.    2016, Elsevier/Gold Standard. (2009-01-20 14:28:07)

## 2016-05-15 NOTE — Progress Notes (Signed)
Patient: Theresa Walton Female    DOB: [birth year]   50 y.o.   MRN: [id number] Visit Date: 05/15/2016  Today's Provider: Mar Daring, PA-C   Chief Complaint  Patient presents with  . Follow-up    Thyroid   Subjective:    HPI  Hypothyroid, follow-up:  TSH  Date Value Ref Range Status  02/14/2016 2.660 0.450 - 4.500 uIU/mL Final  01/22/2016 5.340* 0.450 - 4.500 uIU/mL Final  01/01/2016 4.190 0.450 - 4.500 uIU/mL Final   Wt Readings from Last 3 Encounters:  05/15/16 144 lb 9.6 oz (65.59 kg)  03/05/16 145 lb (65.772 kg)  02/16/16 146 lb 12.8 oz (66.588 kg)    She was last seen for hypothyroid 3 months ago.  Management since that visit includes stable. She reports excellent compliance with treatment. She is not having side effects.  She is exercising.During the week works out twice a day for 15 minutes mostly cardio. She is experiencing fatigue and shaky in the mornings only on her hands. She also reports that her weight still the same and she is not losing any. She denies change in energy level Weight trend: stable  Most recent labs were brought in by patient from 04/22/16.  TSH: 3.040 T4: 6.2 T3 uptake: 1.6 T3: 98  She also had B12 and iron levels checked for fatigue symptoms. Ferritin was low normal.  ------------------------------------------------------------------------    Allergies  Allergen Reactions  . Cefaclor Anaphylaxis  . Cephalosporins Anaphylaxis  . Lubiprostone     Other reaction(s): Angioedema Tongue Swelling x 2 times.   . Guaifenesin & Derivatives   . Pseudoephedrine     Tachycardia   Current Meds  Medication Sig  . b complex vitamins tablet Take 1 tablet by mouth daily. Reported on 12/22/2015  . Cetirizine HCl (ZYRTEC ALLERGY) 10 MG CAPS Take 1 capsule by mouth daily.  . cholecalciferol (VITAMIN D) 1000 units tablet Take 2,000 Units by mouth daily.  . cyclobenzaprine (FLEXERIL) 5 MG tablet Take 1-2 tablets (5-10 mg total) by  mouth at bedtime.  . DYMISTA 137-50 MCG/ACT SUSP instill 1 spray into each nostril twice a day  . FLUoxetine (PROZAC) 20 MG capsule Take 20 mg by mouth at bedtime.  . Glucos-Chond-Sterol-Fish Oil (GLUCOSAMINE CHONDROITIN PLUS) CAPS Take 2 capsules by mouth daily.  Marland Kitchen levothyroxine (SYNTHROID, LEVOTHROID) 25 MCG tablet Take 1 tablet (25 mcg total) by mouth daily before breakfast.  . loratadine (CLARITIN) 10 MG tablet Take 10 mg by mouth daily. Reported on 12/22/2015  . magic mouthwash w/lidocaine SOLN Take 5 mLs by mouth 3 (three) times daily as needed for mouth pain. Swish and spit  . naproxen (NAPROSYN) 500 MG tablet Take 1 tablet (500 mg total) by mouth 2 (two) times daily with a meal.  . valACYclovir (VALTREX) 500 MG tablet Take 1 tablet (500 mg total) by mouth daily. X 3 days and repeat as needed.    Review of Systems  Constitutional: Positive for fatigue. Negative for fever, chills and unexpected weight change.  HENT: Negative.   Respiratory: Negative.   Cardiovascular: Negative for chest pain, palpitations and leg swelling.  Gastrointestinal: Negative.   Neurological: Positive for tremors. Negative for dizziness, weakness, numbness and headaches.  Psychiatric/Behavioral: Negative.     Social History  Substance Use Topics  . Smoking status: Former Smoker    Types: Cigarettes    Quit date: 05/15/2015  . Smokeless tobacco: Never Used  . Alcohol Use: No   Objective:  BP 102/70 mmHg  Pulse 71  Temp(Src) 98.4 F (36.9 C) (Oral)  Resp 16  Wt 144 lb 9.6 oz (65.59 kg)  Physical Exam  Constitutional: She appears well-developed and well-nourished. No distress.  Neck: Normal range of motion. Neck supple. No tracheal deviation present. No thyromegaly present.  Cardiovascular: Normal rate, regular rhythm and normal heart sounds.  Exam reveals no gallop and no friction rub.   No murmur heard. Pulmonary/Chest: Effort normal and breath sounds normal. No respiratory distress. She has no  wheezes. She has no rales.  Lymphadenopathy:    She has no cervical adenopathy.  Skin: She is not diaphoretic.  Vitals reviewed.     Assessment & Plan:     1. Hypothyroidism, unspecified hypothyroidism type Will increase levothyroxine to see if this helps the fatigue symptoms and inability to lose weight. Will f/u in 8 weeks. - levothyroxine (SYNTHROID, LEVOTHROID) 50 MCG tablet; Take 1 tablet (50 mcg total) by mouth daily.  Dispense: 90 tablet; Refill: 0  2. Sleep disturbance She uses prozac and melatonin to help her with sleep. This is working well. - Melatonin 5 MG TABS; Take by mouth.; Refill: 0  3. Essential tremor States noticing an intention tremor that has worsened slightly. States she has noticed it since she was a teenager but it is progressing. Her mother has it as well. She does not wish to start a beta blocker at this time due to possible side effects. She will try to avoid caffeine as this worsens it. She is to call if symptoms worsen.       Mar Daring, PA-C  [facility]

## 2016-05-23 ENCOUNTER — Encounter: Payer: Self-pay | Admitting: Physician Assistant

## 2016-06-07 ENCOUNTER — Ambulatory Visit (INDEPENDENT_AMBULATORY_CARE_PROVIDER_SITE_OTHER): Payer: 59 | Admitting: Physician Assistant

## 2016-06-07 VITALS — BP 100/62 | HR 85 | Temp 98.3°F | Resp 16 | Wt 143.0 lb

## 2016-06-07 DIAGNOSIS — R5382 Chronic fatigue, unspecified: Secondary | ICD-10-CM

## 2016-06-07 DIAGNOSIS — R1013 Epigastric pain: Secondary | ICD-10-CM | POA: Diagnosis not present

## 2016-06-07 DIAGNOSIS — R1031 Right lower quadrant pain: Secondary | ICD-10-CM

## 2016-06-07 MED ORDER — PANTOPRAZOLE SODIUM 40 MG PO TBEC: 40.0000 mg | DELAYED_RELEASE_TABLET | Freq: Every day | ORAL | 0 refills | Status: DC

## 2016-06-07 NOTE — Progress Notes (Signed)
Patient: Theresa Walton Female    DOB: [birth year]   50 y.o.   MRN: [id number] Visit Date: 06/07/2016  Today's Provider: Mar Daring, PA-C   Chief Complaint  Patient presents with  . Abdominal Pain   Subjective:    Abdominal Pain  This is a new problem. Episode onset: x 1 week. The onset quality is gradual. The problem occurs constantly. The problem has been unchanged. The pain is located in the periumbilical region. The pain is at a severity of 7/10. The pain is moderate. The quality of the pain is burning. The abdominal pain does not radiate. Associated symptoms include anorexia, belching, flatus, headaches and nausea. Pertinent negatives include no arthralgias, constipation, diarrhea, dysuria, fever, frequency, hematochezia, hematuria, melena, vomiting or weight loss. The pain is aggravated by eating. Treatments tried: Omeprazole and liquid antacids, as well as a "bland" diet. The treatment provided no relief.  She has had H. Pylori infection before. Cannot remember if this feels similar or not.    Allergies  Allergen Reactions  . Cefaclor Anaphylaxis  . Cephalosporins Anaphylaxis  . Lubiprostone     Other reaction(s): Angioedema Tongue Swelling x 2 times.   . Pseudoephedrine     Tachycardia   Current Meds  Medication Sig  . b complex vitamins tablet Take 1 tablet by mouth daily. Reported on 12/22/2015  . cholecalciferol (VITAMIN D) 1000 units tablet Take 2,000 Units by mouth daily.  Marland Kitchen FLUoxetine (PROZAC) 20 MG capsule Take 50 mg by mouth at bedtime.   Marland Kitchen glucosamine-chondroitin 500-400 MG tablet Take 1 tablet by mouth 3 (three) times daily.  Marland Kitchen levothyroxine (SYNTHROID, LEVOTHROID) 50 MCG tablet Take 1 tablet (50 mcg total) by mouth daily.  . Melatonin 5 MG TABS Take by mouth.  . naproxen (NAPROSYN) 500 MG tablet Take 1 tablet (500 mg total) by mouth 2 (two) times daily with a meal.  . valACYclovir (VALTREX) 500 MG tablet Take 1 tablet (500 mg total) by mouth  daily. X 3 days and repeat as needed.  . [DISCONTINUED] cyclobenzaprine (FLEXERIL) 5 MG tablet Take 1-2 tablets (5-10 mg total) by mouth at bedtime.  . [DISCONTINUED] DYMISTA 137-50 MCG/ACT SUSP instill 1 spray into each nostril twice a day  . [DISCONTINUED] Glucos-Chond-Sterol-Fish Oil (GLUCOSAMINE CHONDROITIN PLUS) CAPS Take 2 capsules by mouth daily.  . [DISCONTINUED] loratadine (CLARITIN) 10 MG tablet Take 10 mg by mouth daily. Reported on 12/22/2015  . [DISCONTINUED] magic mouthwash w/lidocaine SOLN Take 5 mLs by mouth 3 (three) times daily as needed for mouth pain. Swish and spit    Review of Systems  Constitutional: Positive for appetite change, chills, diaphoresis and fatigue. Negative for fever and weight loss.  Respiratory: Negative for cough, chest tightness and shortness of breath.   Cardiovascular: Negative for chest pain, palpitations and leg swelling.  Gastrointestinal: Positive for abdominal distention, abdominal pain, anorexia, flatus and nausea. Negative for blood in stool, constipation, diarrhea, hematochezia, melena and vomiting.  Genitourinary: Negative for dysuria, frequency and hematuria.  Musculoskeletal: Negative for arthralgias.  Neurological: Positive for headaches. Negative for dizziness.    Social History  Substance Use Topics  . Smoking status: Former Smoker    Types: Cigarettes    Quit date: 05/15/2015  . Smokeless tobacco: Never Used  . Alcohol use No   Objective:   BP 100/62 (BP Location: Left Arm, Patient Position: Sitting, Cuff Size: Normal)   Pulse 85   Temp 98.3 F (36.8 C) (Oral)   Resp 16  Wt 143 lb (64.9 kg)   SpO2 98%   BMI 26.16 kg/m   Physical Exam  Constitutional: She is oriented to person, place, and time. She appears well-developed and well-nourished. No distress.  Cardiovascular: Normal rate, regular rhythm and normal heart sounds.  Exam reveals no gallop and no friction rub.   No murmur heard. Pulmonary/Chest: Effort normal and  breath sounds normal. No respiratory distress. She has no wheezes. She has no rales.  Abdominal: Soft. Normal appearance and bowel sounds are normal. She exhibits no distension and no mass. There is no hepatosplenomegaly. There is tenderness in the right lower quadrant, epigastric area and periumbilical area. There is no rebound, no guarding and no CVA tenderness.  Neurological: She is alert and oriented to person, place, and time.  Skin: Skin is warm and dry. She is not diaphoretic.  Vitals reviewed.     Assessment & Plan:     1. RLQ abdominal pain Unknown source. Patient still has appendix but there is no guarding on exam. Would r/o subacute appendicitis.  - Helicobacter pylori abs-IgG+IgA, bld - CBC With Differential - CT Abdomen Pelvis Wo Contrast; Future  2. Epigastric pain Will check labs as below to look for infection or H. Pylori. Will start protonix as below since she is not getting relief with omeprazole. Will f/u pending labs. - Helicobacter pylori abs-IgG+IgA, bld - CBC With Differential - pantoprazole (PROTONIX) 40 MG tablet; Take 1 tablet (40 mg total) by mouth daily.  Dispense: 30 tablet; Refill: 0  3. Chronic fatigue Will check TSH to make sure still WNL.  - Thyroid Panel With TSH  Addend: Positive for H. Pylori. Will treat with clarithromycin and metronidazole (pen allergic). She is to call if no improvement following treatment.       Mar Daring, PA-C  [facility]

## 2016-06-07 NOTE — Patient Instructions (Signed)
Gastritis, Adult Gastritis is soreness and swelling (inflammation) of the lining of the stomach. Gastritis can develop as a sudden onset (acute) or long-term (chronic) condition. If gastritis is not treated, it can lead to stomach bleeding and ulcers. CAUSES  Gastritis occurs when the stomach lining is weak or damaged. Digestive juices from the stomach then inflame the weakened stomach lining. The stomach lining may be weak or damaged due to viral or bacterial infections. One common bacterial infection is the Helicobacter pylori infection. Gastritis can also result from excessive alcohol consumption, taking certain medicines, or having too much acid in the stomach.  SYMPTOMS  In some cases, there are no symptoms. When symptoms are present, they may include:  Pain or a burning sensation in the upper abdomen.  Nausea.  Vomiting.  An uncomfortable feeling of fullness after eating. DIAGNOSIS  Your caregiver may suspect you have gastritis based on your symptoms and a physical exam. To determine the cause of your gastritis, your caregiver may perform the following:  Blood or stool tests to check for the H pylori bacterium.  Gastroscopy. A thin, flexible tube (endoscope) is passed down the esophagus and into the stomach. The endoscope has a light and camera on the end. Your caregiver uses the endoscope to view the inside of the stomach.  Taking a tissue sample (biopsy) from the stomach to examine under a microscope. TREATMENT  Depending on the cause of your gastritis, medicines may be prescribed. If you have a bacterial infection, such as an H pylori infection, antibiotics may be given. If your gastritis is caused by too much acid in the stomach, H2 blockers or antacids may be given. Your caregiver may recommend that you stop taking aspirin, ibuprofen, or other nonsteroidal anti-inflammatory drugs (NSAIDs). HOME CARE INSTRUCTIONS  Only take over-the-counter or prescription medicines as directed by  your caregiver.  If you were given antibiotic medicines, take them as directed. Finish them even if you start to feel better.  Drink enough fluids to keep your urine clear or pale yellow.  Avoid foods and drinks that make your symptoms worse, such as:  Caffeine or alcoholic drinks.  Chocolate.  Peppermint or mint flavorings.  Garlic and onions.  Spicy foods.  Citrus fruits, such as oranges, lemons, or limes.  Tomato-based foods such as sauce, chili, salsa, and pizza.  Fried and fatty foods.  Eat small, frequent meals instead of large meals. SEEK IMMEDIATE MEDICAL CARE IF:   You have black or dark red stools.  You vomit blood or material that looks like coffee grounds.  You are unable to keep fluids down.  Your abdominal pain gets worse.  You have a fever.  You do not feel better after 1 week.  You have any other questions or concerns. MAKE SURE YOU:  Understand these instructions.  Will watch your condition.  Will get help right away if you are not doing well or get worse.   This information is not intended to replace advice given to you by your health care provider. Make sure you discuss any questions you have with your health care provider.   Document Released: 10/08/2001 Document Revised: 04/14/2012 Document Reviewed: 11/27/2011 Elsevier Interactive Patient Education 2016 Elsevier Inc. Pantoprazole tablets What is this medicine? PANTOPRAZOLE (pan TOE pra zole) prevents the production of acid in the stomach. It is used to treat gastroesophageal reflux disease (GERD), inflammation of the esophagus, and Zollinger-Ellison syndrome. This medicine may be used for other purposes; ask your health care provider or pharmacist  if you have questions. What should I tell my health care provider before I take this medicine? They need to know if you have any of these conditions: -liver disease -low levels of magnesium in the blood -an unusual or allergic reaction to  omeprazole, lansoprazole, pantoprazole, rabeprazole, other medicines, foods, dyes, or preservatives -pregnant or trying to get pregnant -breast-feeding How should I use this medicine? Take this medicine by mouth. Swallow the tablets whole with a drink of water. Follow the directions on the prescription label. Do not crush, break, or chew. Take your medicine at regular intervals. Do not take your medicine more often than directed. Talk to your pediatrician regarding the use of this medicine in children. While this drug may be prescribed for children as young as 5 years for selected conditions, precautions do apply. Overdosage: If you think you have taken too much of this medicine contact a poison control center or emergency room at once. NOTE: This medicine is only for you. Do not share this medicine with others. What if I miss a dose? If you miss a dose, take it as soon as you can. If it is almost time for your next dose, take only that dose. Do not take double or extra doses. What may interact with this medicine? Do not take this medicine with any of the following medications: -atazanavir -nelfinavir This medicine may also interact with the following medications: -ampicillin -delavirdine -erlotinib -iron salts -medicines for fungal infections like ketoconazole, itraconazole and voriconazole -methotrexate -mycophenolate mofetil -warfarin This list may not describe all possible interactions. Give your health care provider a list of all the medicines, herbs, non-prescription drugs, or dietary supplements you use. Also tell them if you smoke, drink alcohol, or use illegal drugs. Some items may interact with your medicine. What should I watch for while using this medicine? It can take several days before your stomach pain gets better. Check with your doctor or health care professional if your condition does not start to get better, or if it gets worse. You may need blood work done while you are  taking this medicine. What side effects may I notice from receiving this medicine? Side effects that you should report to your doctor or health care professional as soon as possible: -allergic reactions like skin rash, itching or hives, swelling of the face, lips, or tongue -bone, muscle or joint pain -breathing problems -chest pain or chest tightness -dark yellow or brown urine -dizziness -fast, irregular heartbeat -feeling faint or lightheaded -fever or sore throat -muscle spasm -palpitations -redness, blistering, peeling or loosening of the skin, including inside the mouth -seizures -tremors -unusual bleeding or bruising -unusually weak or tired -yellowing of the eyes or skin Side effects that usually do not require medical attention (Report these to your doctor or health care professional if they continue or are bothersome.): -constipation -diarrhea -dry mouth -headache -nausea This list may not describe all possible side effects. Call your doctor for medical advice about side effects. You may report side effects to FDA at 1-800-FDA-1088. Where should I keep my medicine? Keep out of the reach of children. Store at room temperature between 15 and 30 degrees C (59 and 86 degrees F). Protect from light and moisture. Throw away any unused medicine after the expiration date. NOTE: This sheet is a summary. It may not cover all possible information. If you have questions about this medicine, talk to your doctor, pharmacist, or health care provider.    2016, Elsevier/Gold Standard. (2014-12-02 14:45:56)

## 2016-06-10 ENCOUNTER — Telehealth: Payer: Self-pay | Admitting: *Deleted

## 2016-06-10 ENCOUNTER — Telehealth: Payer: Self-pay

## 2016-06-10 DIAGNOSIS — A048 Other specified bacterial intestinal infections: Secondary | ICD-10-CM

## 2016-06-10 MED ORDER — CLARITHROMYCIN 500 MG PO TABS: 500.0000 mg | ORAL_TABLET | Freq: Two times a day (BID) | ORAL | 0 refills | Status: DC

## 2016-06-10 MED ORDER — METRONIDAZOLE 500 MG PO TABS: 500.0000 mg | ORAL_TABLET | Freq: Three times a day (TID) | ORAL | 0 refills | Status: DC

## 2016-06-10 NOTE — Telephone Encounter (Signed)
Patient was notified of results.  

## 2016-06-10 NOTE — Telephone Encounter (Signed)
Antibiotics sent in.

## 2016-06-10 NOTE — Telephone Encounter (Signed)
-----   Message from Mar Daring, [place] sent at 06/10/2016  1:04 PM EDT ----- Thyroid is stable at 1.590 and T3 is normal at 27. CBC unremarkable. H.Pylori prelim result seems like it is positive. Can await full result if wanted or I can go ahead and call in antibiotics to pharmacy for her.

## 2016-06-10 NOTE — Telephone Encounter (Signed)
Patient called office requesting lab results from Friday.

## 2016-06-10 NOTE — Telephone Encounter (Signed)
Patient advised as directed below. Per patient prefers if you call in antibiotic to her pharmacy.  Thanks,  -[name]

## 2016-06-11 LAB — THYROID PANEL WITH TSH
Free Thyroxine Index: 2 (ref 1.2–4.9)
T3 Uptake Ratio: 27 % (ref 24–39)
T4, Total: 7.3 ug/dL (ref 4.5–12.0)
TSH: 1.59 u[IU]/mL (ref 0.450–4.500)

## 2016-06-11 LAB — CBC WITH DIFFERENTIAL
Basophils Absolute: 0 10*3/uL (ref 0.0–0.2)
Basos: 0 %
EOS (ABSOLUTE): 0.1 10*3/uL (ref 0.0–0.4)
Eos: 1 %
Hematocrit: 39.8 % (ref 34.0–46.6)
Hemoglobin: 12.6 g/dL (ref 11.1–15.9)
Immature Grans (Abs): 0 10*3/uL (ref 0.0–0.1)
Immature Granulocytes: 0 %
Lymphocytes Absolute: 2.7 10*3/uL (ref 0.7–3.1)
Lymphs: 40 %
MCH: 29.4 pg (ref 26.6–33.0)
MCHC: 31.7 g/dL (ref 31.5–35.7)
MCV: 93 fL (ref 79–97)
Monocytes Absolute: 0.4 10*3/uL (ref 0.1–0.9)
Monocytes: 7 %
Neutrophils Absolute: 3.5 10*3/uL (ref 1.4–7.0)
Neutrophils: 52 %
RBC: 4.29 x10E6/uL (ref 3.77–5.28)
RDW: 12.8 % (ref 12.3–15.4)
WBC: 6.7 10*3/uL (ref 3.4–10.8)

## 2016-06-11 LAB — HELICOBACTER PYLORI ABS-IGG+IGA, BLD
H Pylori IgG: 1.1 U/mL — ABNORMAL HIGH (ref 0.0–0.8)
H. pylori, IgA Abs: <9 units (ref 0.0–8.9)

## 2016-06-12 ENCOUNTER — Telehealth: Payer: Self-pay | Admitting: Family Medicine

## 2016-06-12 ENCOUNTER — Telehealth: Payer: Self-pay | Admitting: Physician Assistant

## 2016-06-12 MED ORDER — ONDANSETRON HCL 8 MG PO TABS: 8.0000 mg | ORAL_TABLET | Freq: Three times a day (TID) | ORAL | 0 refills | Status: DC | PRN

## 2016-06-12 NOTE — Telephone Encounter (Signed)
Yes change has been made. And that was by accident, I thought I had ordered with.

## 2016-06-12 NOTE — Telephone Encounter (Signed)
Sent in Zofran to [facility]

## 2016-06-12 NOTE — Telephone Encounter (Signed)
[facility] is requesting CT of abd/pelvis to be done with contrast. You had ordered w/o.Do you approve changing this ?

## 2016-06-12 NOTE — Telephone Encounter (Signed)
Pt states she is having a lot of nausea.  Pt thinks it is from the Rx she is taking.  Pt is requesting something to help with the nausea.  [facility] [place].  CB#[phone]/MW

## 2016-06-12 NOTE — Telephone Encounter (Signed)
Please advise. Theresa Walton, CMA  

## 2016-06-12 NOTE — Telephone Encounter (Signed)
Please review.  Thanks,  -Jaquitta Dupriest 

## 2016-06-14 ENCOUNTER — Ambulatory Visit
Admission: RE | Admit: 2016-06-14 | Discharge: 2016-06-14 | Disposition: A | Discharge status: Home / Self Care | Payer: 59 | Source: Ambulatory Visit | Attending: Physician Assistant | Admitting: Physician Assistant

## 2016-06-14 ENCOUNTER — Telehealth: Payer: Self-pay | Admitting: Family Medicine

## 2016-06-14 DIAGNOSIS — N7011 Chronic salpingitis: Secondary | ICD-10-CM | POA: Diagnosis not present

## 2016-06-14 DIAGNOSIS — R1031 Right lower quadrant pain: Secondary | ICD-10-CM

## 2016-06-14 DIAGNOSIS — N200 Calculus of kidney: Secondary | ICD-10-CM | POA: Insufficient documentation

## 2016-06-14 MED ORDER — IOPAMIDOL (ISOVUE-300) INJECTION 61%
100.0000 mL | Freq: Once | INTRAVENOUS | Status: AC | PRN
  Administered 2016-06-14: 100 mL via INTRAVENOUS

## 2016-06-14 MED ORDER — NYSTATIN 100000 UNIT/ML MT SUSP: 5.0000 mL | Freq: Four times a day (QID) | OROMUCOSAL | 0 refills | Status: DC

## 2016-06-14 NOTE — Telephone Encounter (Signed)
Pt called saying she has developed thrush on her tongue due to the antibiotic she started on Monday.  She was in Friday with Sonia Baller.  [facility] [place]  Her call back is [phone]  Thanks, Con Memos

## 2016-06-14 NOTE — Telephone Encounter (Signed)
Nystatin susp sent to [facility] [place]

## 2016-06-18 ENCOUNTER — Telehealth: Payer: Self-pay

## 2016-06-18 DIAGNOSIS — K2970 Gastritis, unspecified, without bleeding: Secondary | ICD-10-CM

## 2016-06-18 DIAGNOSIS — B9681 Helicobacter pylori [H. pylori] as the cause of diseases classified elsewhere: Secondary | ICD-10-CM

## 2016-06-18 DIAGNOSIS — R1013 Epigastric pain: Secondary | ICD-10-CM

## 2016-06-18 NOTE — Telephone Encounter (Signed)
Does she want GI referral? I am not sure of cause.

## 2016-06-18 NOTE — Telephone Encounter (Signed)
LMTCB  Thanks,  -Ashly Yepez 

## 2016-06-18 NOTE — Telephone Encounter (Signed)
Patient is asking if we got her CT results. Please advise.  Thanks,  -Jenna Routzahn

## 2016-06-18 NOTE — Telephone Encounter (Signed)
Pt advised; She report that she has taking more than half of her antibiotics and is not feeling any better.  She also says she is very fatigued.  Please advised.  [facility]: [phone].  Thanks,   -Mickel Baas

## 2016-06-18 NOTE — Telephone Encounter (Signed)
Normal CT with exception of small left renal cyst, stable and a hydrosalpinx on left (swollen fallopian tube). Being that she has already had hysterectomy there is nothing to be done at this time besides monitor. I had sent results via mychart.

## 2016-06-19 NOTE — Telephone Encounter (Signed)
Pt stated she agrees and would like to move forward with the GI referral but would like to go somewhere other than [facility]. She has been there before and doesn't wish to return. Pt would like get a referral to [facility] for massage therapy as discuss at last OV. Please advise. Thanks TNP

## 2016-06-20 NOTE — Telephone Encounter (Signed)
Referral placed and I will have to give a hand written Rx for massage that she will have to come pick up.

## 2016-06-20 NOTE — Telephone Encounter (Signed)
Patient advised as directed below and that prescription will be up front ready for pick up.  Thanks,  -Vaun Hyndman

## 2016-06-21 ENCOUNTER — Telehealth: Payer: Self-pay | Admitting: Gastroenterology

## 2016-06-21 ENCOUNTER — Encounter: Payer: Self-pay | Admitting: Physician Assistant

## 2016-06-21 NOTE — Telephone Encounter (Signed)
I have called patient to make an appointment with GI per Referral. No answer. I have left a message on voicemail. Patient will need an appointment with Dr Allen Norris for Epigastric pain, Helicobacter pylori gastritis.

## 2016-06-27 ENCOUNTER — Ambulatory Visit (INDEPENDENT_AMBULATORY_CARE_PROVIDER_SITE_OTHER): Payer: 59 | Admitting: Gastroenterology

## 2016-06-27 ENCOUNTER — Encounter: Payer: Self-pay | Admitting: Gastroenterology

## 2016-06-27 VITALS — BP 118/75 | HR 84 | Temp 98.5°F | Ht 62.0 in | Wt 142.5 lb

## 2016-06-27 DIAGNOSIS — K589 Irritable bowel syndrome without diarrhea: Secondary | ICD-10-CM | POA: Diagnosis not present

## 2016-06-27 DIAGNOSIS — R634 Abnormal weight loss: Secondary | ICD-10-CM

## 2016-06-27 MED ORDER — NYSTATIN 100000 UNIT/ML MT SUSP: 5.0000 mL | Freq: Four times a day (QID) | OROMUCOSAL | 0 refills | Status: DC

## 2016-06-27 NOTE — Progress Notes (Signed)
Gastroenterology Consultation  Referring Provider:     Margarita Rana, MD Primary Care Physician:  Margarita Rana, MD Primary Gastroenterologist:  Dr. Allen Norris     Reason for Consultation:     Abdominal discomfort        HPI:   Theresa Walton is a 50 y.o. y/o female referred for consultation & management of Abdominal discomfort by Dr. Margarita Rana, MD.  This patient comes today with a history of bloating and burning around the umbilicus and the lower abdomen. The patient reports that she had not had a workup for this in some time and denies having a EGD and colonoscopy recently. The patient was seen by Dr. Rayann Heman and it appears that she did have a workup in 2015 without any significant findings on her endoscopy or colonoscopy to explain her symptoms. The patient was started at that time on a probiotic and Linzess. She states that she is no longer taking those. She also reports that she has had the development of multiple food allergies and cannot take dairy products. The patient also states that she was found to have H. pylori antibody positive but stool test negative. The patient states that when she had the stool test done she was on a PPI at that time. She also reports that she has lost approximate 7 pound since being on the antibiotics and states that it has also made her have diarrhea although she finished the antibiotic week ago. The patient states that she also has a lot of burping and has had bloating.  Past Medical History:  Diagnosis Date  . 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[place])   . Genital herpes   . Seasonal allergies     Past Surgical History:  Procedure Laterality Date  . ABDOMINAL HYSTERECTOMY    . ARTHROSCOPIC REPAIR ACL  2013   Right knee  . AUGMENTATION MAMMAPLASTY Bilateral 2000   saline  . BREAST ENHANCEMENT SURGERY  1995  . CYSTOCELE REPAIR  2013  . ENDOMETRIAL ABLATION  2013  . [place], 2001  . PARTIAL HYSTERECTOMY  2015  . RECTOPEXY  2011  .  TONSILECTOMY, ADENOIDECTOMY, BILATERAL MYRINGOTOMY AND TUBES  1984    Prior to Admission medications   Medication Sig Start Date End Date Taking? Authorizing Provider  FLUoxetine (PROZAC) 20 MG capsule Take 50 mg by mouth at bedtime.  04/22/16  Yes Historical Provider, MD  levothyroxine (SYNTHROID, LEVOTHROID) 50 MCG tablet Take 1 tablet (50 mcg total) by mouth daily. 05/15/16  Yes Mar Daring, PA-C  Melatonin 5 MG TABS Take by mouth. 05/15/16  Yes Mar Daring, PA-C  naproxen (NAPROSYN) 500 MG tablet Take 1 tablet (500 mg total) by mouth 2 (two) times daily with a meal. 03/05/16  Yes Lorin Picket, PA-C  ondansetron (ZOFRAN) 8 MG tablet Take 1 tablet (8 mg total) by mouth every 8 (eight) hours as needed for nausea or vomiting. 06/12/16  Yes Clearnce Sorrel Burnette, PA-C  pantoprazole (PROTONIX) 40 MG tablet Take 1 tablet (40 mg total) by mouth daily. 06/07/16  Yes Clearnce Sorrel Burnette, PA-C  valACYclovir (VALTREX) 500 MG tablet Take 1 tablet (500 mg total) by mouth daily. X 3 days and repeat as needed. 02/07/16  Yes Mar Daring, PA-C  b complex vitamins tablet Take 1 tablet by mouth daily. Reported on 12/22/2015    Historical Provider, MD  cholecalciferol (VITAMIN D) 1000 units tablet Take 2,000 Units by mouth daily.    Historical Provider,  MD  clarithromycin (BIAXIN) 500 MG tablet Take 1 tablet (500 mg total) by mouth 2 (two) times daily. Patient not taking: Reported on 06/27/2016 06/10/16   Mar Daring, PA-C  glucosamine-chondroitin 500-400 MG tablet Take 1 tablet by mouth 3 (three) times daily.    Historical Provider, MD  metroNIDAZOLE (FLAGYL) 500 MG tablet Take 1 tablet (500 mg total) by mouth 3 (three) times daily. Patient not taking: Reported on 06/27/2016 06/10/16   Mar Daring, PA-C  nystatin (MYCOSTATIN) 100000 UNIT/ML suspension Take 5 mLs (500,000 Units total) by mouth 4 (four) times daily. Swish and spit. 06/27/16   Lucilla Lame, MD    Family History    Problem Relation Age of Onset  . Pulmonary fibrosis Mother   . Healthy Sister   . Healthy Brother   . Healthy Sister   . Lung cancer Paternal Grandmother   . Heart disease Father      Social History  Substance Use Topics  . Smoking status: Former Smoker    Types: Cigarettes    Quit date: 05/15/2015  . Smokeless tobacco: Never Used  . Alcohol use No    Allergies as of 06/27/2016 - Review Complete 06/27/2016  Allergen Reaction Noted  . Cefaclor Anaphylaxis 04/19/2015  . Cephalosporins Anaphylaxis 04/19/2015  . Lubiprostone  12/22/2015  . Pseudoephedrine  06/21/2015    Review of Systems:    All systems reviewed and negative except where noted in HPI.   Physical Exam:  BP 118/75 (BP Location: Right Arm, Patient Position: Sitting, Cuff Size: Normal)   Pulse 84   Temp 98.5 F (36.9 C) (Oral)   Ht 5\' 2"  (1.575 m)   Wt 142 lb 8 oz (64.6 kg)   BMI 26.06 kg/m  No LMP recorded. Patient has had a hysterectomy. Psych:  Alert and cooperative. Normal mood and affect. General:   Alert,  Well-developed, well-nourished, pleasant and cooperative in NAD Head:  Normocephalic and atraumatic. Eyes:  Sclera clear, no icterus.   Conjunctiva pink. Ears:  Normal auditory acuity. Nose:  No deformity, discharge, or lesions. Mouth:  No deformity or lesions,oropharynx pink & moist. Neck:  Supple; no masses or thyromegaly. Lungs:  Respirations even and unlabored.  Clear throughout to auscultation.   No wheezes, crackles, or rhonchi. No acute distress. Heart:  Regular rate and rhythm; no murmurs, clicks, rubs, or gallops. Abdomen:  Normal bowel sounds.  No bruits.  Soft, non-tender and non-distended without masses, hepatosplenomegaly or hernias noted.  No guarding or rebound tenderness.  Negative Carnett sign.   Rectal:  Deferred.  Msk:  Symmetrical without gross deformities.  Good, equal movement & strength bilaterally. Pulses:  Normal pulses noted. Extremities:  No clubbing or edema.  No  cyanosis. Neurologic:  Alert and oriented x3;  grossly normal neurologically. Skin:  Intact without significant lesions or rashes.  No jaundice. Lymph Nodes:  No significant cervical adenopathy. Psych:  Alert and cooperative. Normal mood and affect.  Imaging Studies: Ct Abdomen Pelvis W Contrast  Result Date: 06/14/2016 CLINICAL DATA:  Right lower quadrant abdominal pain. EXAM: CT ABDOMEN AND PELVIS WITH CONTRAST TECHNIQUE: Multidetector CT imaging of the abdomen and pelvis was performed using the standard protocol following bolus administration of intravenous contrast. CONTRAST:  15mL ISOVUE-300 IOPAMIDOL (ISOVUE-300) INJECTION 61% COMPARISON:  09/29/2014 FINDINGS: Lower chest: Lung bases are clear. No effusions. Heart is normal size. Hepatobiliary: No focal hepatic abnormality. Gallbladder unremarkable. Pancreas: No focal abnormality or ductal dilatation. Spleen: No focal abnormality.  Normal size. Adrenals/Urinary Tract:  Small cysts in the midpole of the left kidney. Punctate nonobstructing stones in the lower pole wounds of both kidneys. No hydronephrosis. No ureteral stones. Adrenal glands and urinary bladder. Stomach/Bowel: Appendix is visualized and is normal. Stomach, large and small bowel grossly unremarkable. Vascular/Lymphatic: No evidence of aneurysm or adenopathy. Reproductive: Prior hysterectomy. Tubular fluid structure noted in the left pelvis, likely hydrosalpinx. Other: No free fluid or free air. Musculoskeletal: No acute bony abnormality or focal bone lesion. IMPRESSION: Normal appendix. Punctate bilateral lower pole nephrolithiasis. Probable left adnexal hydrosalpinx.  Prior hysterectomy. Electronically Signed   By: Rolm Baptise M.D.   On: 06/14/2016 12:18    Assessment and Plan:   Jalesa Bigger is a 50 y.o. y/o female who comes in today with symptoms and signs consistent with irritable bowel syndrome. The patient has been seen for the same in the past. The patient has a lot of  dyspepsia with burping. The patient will be switched from her PPI to samples of the excellent. The patient has also been given samples of IBDgard and has been told to start VSL #3 probiotics. The patient is concerned that she is now absorbing and will have blood work sent off to look for signs of malabsorption. The patient has been explained the plan and agrees with it.   Note: This dictation was prepared with Dragon dictation along with smaller phrase technology. Any transcriptional errors that result from this process are unintentional.

## 2016-06-28 LAB — IRON AND TIBC
Iron Saturation: 21 % (ref 15–55)
Iron: 72 ug/dL (ref 27–159)
Total Iron Binding Capacity: 350 ug/dL (ref 250–450)
UIBC: 278 ug/dL (ref 131–425)

## 2016-06-28 LAB — VITAMIN B12: Vitamin B-12: 650 pg/mL (ref 211–946)

## 2016-06-28 LAB — FOLATE: Folate: 12.7 ng/mL (ref >3.0)

## 2016-06-28 LAB — VITAMIN D 25 HYDROXY (VIT D DEFICIENCY, FRACTURES): Vit D, 25-Hydroxy: 40.3 ng/mL (ref 30.0–100.0)

## 2016-07-02 ENCOUNTER — Telehealth: Payer: Self-pay | Admitting: Physician Assistant

## 2016-07-02 DIAGNOSIS — T3695XA Adverse effect of unspecified systemic antibiotic, initial encounter: Principal | ICD-10-CM

## 2016-07-02 DIAGNOSIS — B379 Candidiasis, unspecified: Secondary | ICD-10-CM

## 2016-07-02 MED ORDER — FLUCONAZOLE 150 MG PO TABS
150.0000 mg | ORAL_TABLET | Freq: Once | ORAL | 0 refills | Status: AC
Start: 2016-07-02 — End: 2016-07-02

## 2016-07-02 NOTE — Telephone Encounter (Signed)
Pt stated that due to the antibiotics she was taken clarithromycin (BIAXIN) 500 MG tablet & metroNIDAZOLE (FLAGYL) 500 MG tablet  She believes she has a yeast infection and would like an RX for Diflucan sent to [facility] on [address]. Please advise. Thanks TNP

## 2016-07-02 NOTE — Telephone Encounter (Signed)
Diflucan sent to [facility]

## 2016-07-02 NOTE — Telephone Encounter (Signed)
Is this okay to send in? Theresa Walton, CMA

## 2016-07-05 ENCOUNTER — Ambulatory Visit (INDEPENDENT_AMBULATORY_CARE_PROVIDER_SITE_OTHER): Payer: 59 | Admitting: Family Medicine

## 2016-07-05 ENCOUNTER — Telehealth: Payer: Self-pay

## 2016-07-05 ENCOUNTER — Encounter: Payer: Self-pay | Admitting: Family Medicine

## 2016-07-05 VITALS — BP 90/60 | HR 80 | Temp 99.3°F | Resp 16 | Wt 145.0 lb

## 2016-07-05 DIAGNOSIS — E039 Hypothyroidism, unspecified: Secondary | ICD-10-CM

## 2016-07-05 DIAGNOSIS — M791 Myalgia, unspecified site: Secondary | ICD-10-CM

## 2016-07-05 DIAGNOSIS — R5383 Other fatigue: Secondary | ICD-10-CM

## 2016-07-05 DIAGNOSIS — J069 Acute upper respiratory infection, unspecified: Secondary | ICD-10-CM

## 2016-07-05 NOTE — Telephone Encounter (Signed)
Pt notified of lab results

## 2016-07-05 NOTE — Progress Notes (Signed)
Patient: Keilly Fatula Female    DOB: 04/29/1966   50 y.o.   MRN: [id number] Visit Date: 07/05/2016  Today's Provider: Lelon Huh, MD   Chief Complaint  Patient presents with  . Fatigue   Subjective:    HPI Fatigue:  Patient presents reporting that for the past week she has been fatigued with muscle aches, headaches, ringing in the ears and sinus congestion . She has also had some shortness of breath. Patient reports she just completed a round of antibiotics that was treating H. Pylori infection. Patient states she developed oral thrush from taking antibiotics and was then treatted with Nystatin. Patient has had some sore throat.     Allergies  Allergen Reactions  . Cefaclor Anaphylaxis  . Cephalosporins Anaphylaxis  . Lubiprostone     Other reaction(s): Angioedema Tongue Swelling x 2 times.   . Pseudoephedrine     Tachycardia     Current Outpatient Prescriptions:  .  b complex vitamins tablet, Take 1 tablet by mouth daily. Reported on 12/22/2015, Disp: , Rfl:  .  cholecalciferol (VITAMIN D) 1000 units tablet, Take 2,000 Units by mouth daily., Disp: , Rfl:  .  FLUoxetine (PROZAC) 20 MG capsule, Take 50 mg by mouth at bedtime. , Disp: , Rfl: 0 .  glucosamine-chondroitin 500-400 MG tablet, Take 1 tablet by mouth 3 (three) times daily., Disp: , Rfl:  .  levothyroxine (SYNTHROID, LEVOTHROID) 50 MCG tablet, Take 1 tablet (50 mcg total) by mouth daily., Disp: 90 tablet, Rfl: 0 .  Melatonin 5 MG TABS, Take by mouth., Disp: , Rfl: 0 .  naproxen (NAPROSYN) 500 MG tablet, Take 1 tablet (500 mg total) by mouth 2 (two) times daily with a meal., Disp: 60 tablet, Rfl: 0 .  nystatin (MYCOSTATIN) 100000 UNIT/ML suspension, Take 5 mLs (500,000 Units total) by mouth 4 (four) times daily. Swish and spit., Disp: 60 mL, Rfl: 0 .  ondansetron (ZOFRAN) 8 MG tablet, Take 1 tablet (8 mg total) by mouth every 8 (eight) hours as needed for nausea or vomiting., Disp: 30 tablet, Rfl: 0 .   pantoprazole (PROTONIX) 40 MG tablet, Take 1 tablet (40 mg total) by mouth daily., Disp: 30 tablet, Rfl: 0 .  valACYclovir (VALTREX) 500 MG tablet, Take 1 tablet (500 mg total) by mouth daily. X 3 days and repeat as needed., Disp: 30 tablet, Rfl: 6  Review of Systems  Constitutional: Positive for chills, diaphoresis, fatigue and fever. Negative for appetite change.  HENT: Positive for congestion, ear pain (discomfort), postnasal drip, sinus pressure, sneezing, sore throat and tinnitus. Negative for ear discharge, nosebleeds, rhinorrhea and voice change.   Eyes: Positive for itching. Negative for photophobia, pain, discharge, redness and visual disturbance.  Respiratory: Positive for shortness of breath. Negative for cough, chest tightness and wheezing.   Cardiovascular: Negative for chest pain and palpitations.  Gastrointestinal: Positive for diarrhea. Negative for abdominal pain, nausea and vomiting.  Musculoskeletal: Positive for myalgias.  Neurological: Positive for dizziness, light-headedness and headaches. Negative for weakness.  Psychiatric/Behavioral: Positive for decreased concentration.    Social History  Substance Use Topics  . Smoking status: Former Smoker    Types: Cigarettes    Quit date: 05/15/2015  . Smokeless tobacco: Never Used  . Alcohol use No   Objective:   BP 90/60 (BP Location: Right Arm, Patient Position: Sitting, Cuff Size: Large)   Pulse 80   Temp 99.3 F (37.4 C) (Oral)   Resp 16   Wt 145  lb (65.8 kg)   BMI 26.52 kg/m   Physical Exam   General Appearance:    Alert, cooperative, no distress  Eyes:    PERRL, conjunctiva/corneas clear, EOM's intact       Lungs:     Clear to auscultation bilaterally, respirations unlabored  Heart:    Regular rate and rhythm  Neurologic:   Awake, alert, oriented x 3. No apparent focal neurological           defect.          Assessment & Plan:     1. Other fatigue  - CBC  2. Hypothyroidism, unspecified  hypothyroidism type  - T4 AND TSH  3. Myalgia  - Sed Rate (ESR)  4. Upper respiratory infection Counseled regarding signs and symptoms of viral and bacterial respiratory infections. Advised to call or return for additional evaluation if he develops any sign of bacterial infection, or if current symptoms last longer than 10 days.         Donald Fisher, MD  [facility] 

## 2016-07-05 NOTE — Telephone Encounter (Deleted)
-----   Message from Lucilla Lame, MD sent at 06/28/2016  1:03 PM EDT ----- Let the patient know that the levels checking her vitamins were found to be normal.

## 2016-07-05 NOTE — Telephone Encounter (Signed)
-----   Message from Lucilla Lame, MD sent at 06/28/2016  1:03 PM EDT ----- Let the patient know that the levels checking her vitamins were found to be normal.

## 2016-07-06 LAB — CBC
Hematocrit: 37.5 % (ref 34.0–46.6)
Hemoglobin: 12 g/dL (ref 11.1–15.9)
MCH: 29.5 pg (ref 26.6–33.0)
MCHC: 32 g/dL (ref 31.5–35.7)
MCV: 92 fL (ref 79–97)
Platelets: 294 10*3/uL (ref 150–379)
RBC: 4.07 x10E6/uL (ref 3.77–5.28)
RDW: 12.6 % (ref 12.3–15.4)
WBC: 6.6 10*3/uL (ref 3.4–10.8)

## 2016-07-06 LAB — SEDIMENTATION RATE: Sed Rate: 2 mm/hr (ref 0–40)

## 2016-07-06 LAB — T4 AND TSH
T4, Total: 7.1 ug/dL (ref 4.5–12.0)
TSH: 1.64 u[IU]/mL (ref 0.450–4.500)

## 2016-07-09 ENCOUNTER — Encounter: Payer: Self-pay | Admitting: Physician Assistant

## 2016-07-16 ENCOUNTER — Ambulatory Visit (INDEPENDENT_AMBULATORY_CARE_PROVIDER_SITE_OTHER): Payer: 59 | Admitting: Physician Assistant

## 2016-07-16 ENCOUNTER — Encounter: Payer: Self-pay | Admitting: Physician Assistant

## 2016-07-16 VITALS — BP 90/60 | HR 76 | Temp 98.2°F | Resp 16 | Ht 62.0 in | Wt 141.0 lb

## 2016-07-16 DIAGNOSIS — K589 Irritable bowel syndrome without diarrhea: Secondary | ICD-10-CM | POA: Diagnosis not present

## 2016-07-16 DIAGNOSIS — R5383 Other fatigue: Secondary | ICD-10-CM | POA: Diagnosis not present

## 2016-07-16 DIAGNOSIS — Z1159 Encounter for screening for other viral diseases: Secondary | ICD-10-CM | POA: Diagnosis not present

## 2016-07-16 DIAGNOSIS — R220 Localized swelling, mass and lump, head: Secondary | ICD-10-CM | POA: Diagnosis not present

## 2016-07-16 DIAGNOSIS — R21 Rash and other nonspecific skin eruption: Secondary | ICD-10-CM

## 2016-07-16 NOTE — Patient Instructions (Signed)
Ranitidine tablets or capsules What is this medicine? RANITIDINE (ra NYE te deen) is a type of antihistamine that blocks the release of stomach acid. It is used to treat stomach or intestinal ulcers. It can relieve ulcer pain and discomfort, and the heartburn from acid reflux. This medicine may be used for other purposes; ask your health care provider or pharmacist if you have questions. What should I tell my health care provider before I take this medicine? They need to know if you have any of these conditions: -kidney disease -liver disease -porphyria -an unusual or allergic reaction to ranitidine, other medicines, foods, dyes, or preservatives -pregnant or trying to get pregnant -breast-feeding How should I use this medicine? Take this medicine by mouth with a glass of water. Follow the directions on the prescription label. If you only take this medicine once a day, take it at bedtime. Take your medicine at regular intervals. Do not take your medicine more often than directed. Do not stop taking except on your doctor's advice. Talk to your pediatrician regarding the use of this medicine in children. Special care may be needed. Overdosage: If you think you have taken too much of this medicine contact a poison control center or emergency room at once. NOTE: This medicine is only for you. Do not share this medicine with others. What if I miss a dose? If you miss a dose, take it as soon as you can. If it is almost time for your next dose, take only that dose. Do not take double or extra doses. What may interact with this medicine? -atazanavir -delavirdine -gefitinib -glipizide -ketoconazole -midazolam -procainamide -propantheline -triazolam -warfarin This list may not describe all possible interactions. Give your health care provider a list of all the medicines, herbs, non-prescription drugs, or dietary supplements you use. Also tell them if you smoke, drink alcohol, or use illegal  drugs. Some items may interact with your medicine. What should I watch for while using this medicine? Tell your doctor or health care professional if your condition does not start to get better or gets worse. You may need to take this medicine for several days as prescribed before your symptoms get better. Finish the full course of tablets prescribed, even if you feel better. Do not smoke cigarettes or drink alcohol. These increase irritation in your stomach and can lengthen the time it will take for ulcers to heal. Cigarettes and alcohol can also make acid reflux or heartburn worse. If you get black, tarry stools or vomit up what looks like coffee grounds, call your doctor or health care professional at once. You may have a bleeding ulcer. What side effects may I notice from receiving this medicine? Side effects that you should report to your doctor or health care professional as soon as possible: -agitation, nervousness, depression, hallucinations -allergic reactions like skin rash, itching or hives, swelling of the face, lips, or tongue -breast enlargement in both males and females -breathing problems -redness, blistering, peeling or loosening of the skin, including inside the mouth -unusual bleeding or bruising -unusually weak or tired -vomiting -yellowing of the skin or eyes Side effects that usually do not require medical attention (report to your doctor or health care professional if they continue or are bothersome): -constipation or diarrhea -dizziness -headache -nausea This list may not describe all possible side effects. Call your doctor for medical advice about side effects. You may report side effects to FDA at 1-800-FDA-1088. Where should I keep my medicine? Keep out of the reach  of children. Store at room temperature between 15 and 30 degrees C (59 and 86 degrees F). Protect from light and moisture. Keep container tightly closed. Throw away any unused medicine after the  expiration date. NOTE: This sheet is a summary. It may not cover all possible information. If you have questions about this medicine, talk to your doctor, pharmacist, or health care provider.    2016, Elsevier/Gold Standard. (2013-02-03 14:50:34)

## 2016-07-16 NOTE — Progress Notes (Signed)
Patient: Theresa Walton Female    DOB: 02-Jul-1966   50 y.o.   MRN: [id number] Visit Date: 07/16/2016  Today's Provider: Mar Daring, PA-C   Chief Complaint  Patient presents with  . Hypothyroidism   Subjective:    HPI  Hypothyroid, follow-up:  TSH  Date Value Ref Range Status  07/05/2016 1.640 0.450 - 4.500 uIU/mL Final  06/07/2016 1.590 0.450 - 4.500 uIU/mL Final  02/14/2016 2.660 0.450 - 4.500 uIU/mL Final   Wt Readings from Last 3 Encounters:  07/16/16 141 lb (64 kg)  07/05/16 145 lb (65.8 kg)  06/27/16 142 lb 8 oz (64.6 kg)    She was last seen for hypothyroid 2 months ago.  Management since that visit includes labs checked. She reports excellent compliance with treatment. She is not having side effects.  She is exercising. She is experiencing change in energy level and diarrhea She denies heat / cold intolerance Weight trend: decreasing steadily  ------------------------------------------------------------------------   Follow up for fatigue  The patient was last seen for this 2 months ago. Changes made at last visit include labs checked.  She reports excellent compliance with treatment. She feels that condition is Unchanged. She is still having headaches, stomach ache , diarrhea, and tongue is still swollen.  She has been seen by Dr.Wohl,GI, and all the testing that he performed was normal. She was also then seen by Dr. Richardson Landry, ENT. He felt that the tongue swelling may be possible allergy. He did repeat some environmental allergies which were all normal with the exception of dust mites. He did refill her nystatin and told her to continue that with her allergy medications.  ------------------------------------------------------------------------------------     Allergies  Allergen Reactions  . Cefaclor Anaphylaxis  . Cephalosporins Anaphylaxis  . Lubiprostone     Other reaction(s): Angioedema Tongue Swelling x 2 times.   .  Pseudoephedrine     Tachycardia     Current Outpatient Prescriptions:  .  b complex vitamins tablet, Take 1 tablet by mouth daily. Reported on 12/22/2015, Disp: , Rfl:  .  cholecalciferol (VITAMIN D) 1000 units tablet, Take 2,000 Units by mouth daily., Disp: , Rfl:  .  FLUoxetine (PROZAC) 20 MG capsule, Take 50 mg by mouth at bedtime. , Disp: , Rfl: 0 .  glucosamine-chondroitin 500-400 MG tablet, Take 1 tablet by mouth 3 (three) times daily., Disp: , Rfl:  .  levothyroxine (SYNTHROID, LEVOTHROID) 50 MCG tablet, Take 1 tablet (50 mcg total) by mouth daily., Disp: 90 tablet, Rfl: 0 .  Melatonin 5 MG TABS, Take by mouth., Disp: , Rfl: 0 .  naproxen (NAPROSYN) 500 MG tablet, Take 1 tablet (500 mg total) by mouth 2 (two) times daily with a meal., Disp: 60 tablet, Rfl: 0 .  nystatin (MYCOSTATIN) 100000 UNIT/ML suspension, Take 5 mLs (500,000 Units total) by mouth 4 (four) times daily. Swish and spit., Disp: 60 mL, Rfl: 0 .  ondansetron (ZOFRAN) 8 MG tablet, Take 1 tablet (8 mg total) by mouth every 8 (eight) hours as needed for nausea or vomiting., Disp: 30 tablet, Rfl: 0 .  pantoprazole (PROTONIX) 40 MG tablet, Take 1 tablet (40 mg total) by mouth daily., Disp: 30 tablet, Rfl: 0 .  valACYclovir (VALTREX) 500 MG tablet, Take 1 tablet (500 mg total) by mouth daily. X 3 days and repeat as needed., Disp: 30 tablet, Rfl: 6  Review of Systems  Constitutional: Positive for activity change and fatigue.  HENT: Negative for dental problem,  drooling, ear discharge, ear pain, facial swelling, mouth sores, postnasal drip, rhinorrhea, sinus pressure, sneezing, sore throat and trouble swallowing.        Tongue swelling  Respiratory: Negative for cough, chest tightness and shortness of breath.   Cardiovascular: Negative for chest pain, palpitations and leg swelling.  Gastrointestinal: Positive for abdominal pain and diarrhea.  Endocrine: Negative.   Neurological: Positive for headaches.  Psychiatric/Behavioral:  Positive for dysphoric mood. Negative for agitation. The patient is not nervous/anxious.     Social History  Substance Use Topics  . Smoking status: Former Smoker    Types: Cigarettes    Quit date: 05/15/2015  . Smokeless tobacco: Never Used  . Alcohol use No   Objective:   BP 90/60 (BP Location: Right Arm, Patient Position: Sitting, Cuff Size: Large)   Pulse 76   Temp 98.2 F (36.8 C) (Oral)   Resp 16   Ht 5\' 2"  (1.575 m)   Wt 141 lb (64 kg)   SpO2 99%   BMI 25.79 kg/m   Physical Exam  Constitutional: She appears well-developed and well-nourished. No distress.  HENT:  Head: Normocephalic and atraumatic.  Right Ear: Tympanic membrane, external ear and ear canal normal.  Left Ear: Tympanic membrane, external ear and ear canal normal.  Nose: Nose normal.  Mouth/Throat: Uvula is midline, oropharynx is clear and moist and mucous membranes are normal. No oral lesions. No uvula swelling. No oropharyngeal exudate, posterior oropharyngeal edema or posterior oropharyngeal erythema.  Tongue doesn't appear significantly swollen just larger tongue and has scalloped edges.  Neck: Normal range of motion. Neck supple. No JVD present. No tracheal deviation present. No thyromegaly present.  Cardiovascular: Normal rate, regular rhythm and normal heart sounds.  Exam reveals no gallop and no friction rub.   No murmur heard. Pulmonary/Chest: Effort normal and breath sounds normal. No respiratory distress. She has no wheezes. She has no rales.  Lymphadenopathy:    She has no cervical adenopathy.  Skin: She is not diaphoretic.  Vitals reviewed.     Assessment & Plan:     1. Mild tongue swelling Discussed a referral to Dr. Donneta Romberg who is an allergist/immunotherapist. She refuses at this time. I discussed that she may could add Zantac with her allergy medications to see if this may help. She reports that she would like to follow up with the allergy specialist that is with [facility].  2.  Rash She again reports skin lesions that are like hard papules that are flesh colored. She reports that she can scratch them and something like a hard gr of salt/sand will come out of the skin and then it will bleed following. She reports that she has seen dermatology in June but was not having this issue at that time so it was not addressed. I discussed for her to call her dermatologist to see if she can get a follow-up for this issue.  3. Need for hepatitis C screening test - Hepatitis C Antibody  4. Other fatigue Unknown source. She has been through multiple labs and abdominal CT without any positive workup. I do question possible chronic fatigue syndrome that is worsening along with a mild case of dysthymic disorder  5. IBS (irritable bowel syndrome) Dr. Allen Norris has started her on IBS Guard and and a probiotic. She reports that this is not helping yet but she will continue therapy for the time being.       Mar Daring, PA-C  [facility]

## 2016-07-19 LAB — HEPATITIS C ANTIBODY: Hep C Virus Ab: <0.1 s/co ratio (ref 0.0–0.9)

## 2016-08-12 ENCOUNTER — Ambulatory Visit (INDEPENDENT_AMBULATORY_CARE_PROVIDER_SITE_OTHER): Payer: 59 | Admitting: Physician Assistant

## 2016-08-12 ENCOUNTER — Encounter: Payer: Self-pay | Admitting: Physician Assistant

## 2016-08-12 VITALS — BP 92/60 | HR 77 | Temp 98.2°F | Resp 16 | Wt 141.2 lb

## 2016-08-12 DIAGNOSIS — Z23 Encounter for immunization: Secondary | ICD-10-CM | POA: Diagnosis not present

## 2016-08-12 DIAGNOSIS — N760 Acute vaginitis: Secondary | ICD-10-CM | POA: Diagnosis not present

## 2016-08-12 DIAGNOSIS — E039 Hypothyroidism, unspecified: Secondary | ICD-10-CM | POA: Diagnosis not present

## 2016-08-12 DIAGNOSIS — Z113 Encounter for screening for infections with a predominantly sexual mode of transmission: Secondary | ICD-10-CM | POA: Diagnosis not present

## 2016-08-12 DIAGNOSIS — A6000 Herpesviral infection of urogenital system, unspecified: Secondary | ICD-10-CM | POA: Diagnosis not present

## 2016-08-12 MED ORDER — LEVOTHYROXINE SODIUM 50 MCG PO TABS: 50.0000 ug | ORAL_TABLET | Freq: Every day | ORAL | 3 refills | Status: DC

## 2016-08-12 NOTE — Patient Instructions (Signed)
Safe Sex Safe sex is about reducing the risk of giving or getting a sexually transmitted disease (STD). STDs are spread through sexual contact involving the genitals, mouth, or rectum. Some STDs can be cured and others cannot. Safe sex can also prevent unintended pregnancies.  WHAT ARE SOME SAFE SEX PRACTICES?  Limit your sexual activity to only one partner who is having sex with only you.  Talk to your partner about his or her past partners, past STDs, and drug use.  Use a condom every time you have sexual intercourse. This includes vaginal, oral, and anal sexual activity. Both females and males should wear condoms during oral sex. Only use latex or polyurethane condoms and water-based lubricants. Using petroleum-based lubricants or oils to lubricate a condom will weaken the condom and increase the chance that it will break. The condom should be in place from the beginning to the end of sexual activity. Wearing a condom reduces, but does not completely eliminate, your risk of getting or giving an STD. STDs can be spread by contact with infected body fluids and skin.  Get vaccinated for hepatitis B and HPV.  Avoid alcohol and recreational drugs, which can affect your judgment. You may forget to use a condom or participate in high-risk sex.  For females, avoid douching after sexual intercourse. Douching can spread an infection farther into the reproductive tract.  Check your body for signs of sores, blisters, rashes, or unusual discharge. See your health care provider if you notice any of these signs.  Avoid sexual contact if you have symptoms of an infection or are being treated for an STD. If you or your partner has herpes, avoid sexual contact when blisters are present. Use condoms at all other times.  If you are at risk of being infected with HIV, it is recommended that you take a prescription medicine daily to prevent HIV infection. This is called pre-exposure prophylaxis (PrEP). You are  considered at risk if:  You are a man who has sex with other men (MSM).  You are a heterosexual man or woman who is sexually active with more than one partner.  You take drugs by injection.  You are sexually active with a partner who has HIV.  Talk with your health care provider about whether you are at high risk of being infected with HIV. If you choose to begin PrEP, you should first be tested for HIV. You should then be tested every 3 months for as long as you are taking PrEP.  See your health care provider for regular screenings, exams, and tests for other STDs. Before having sex with a new partner, each of you should be screened for STDs and should talk about the results with each other. WHAT ARE THE BENEFITS OF SAFE SEX?   There is less chance of getting or giving an STD.  You can prevent unwanted or unintended pregnancies.  By discussing safe sex concerns with your partner, you may increase feelings of intimacy, comfort, trust, and honesty between the two of you.   This information is not intended to replace advice given to you by your health care provider. Make sure you discuss any questions you have with your health care provider.   Document Released: 11/21/2004 Document Revised: 11/04/2014 Document Reviewed: 04/06/2012 Elsevier Interactive Patient Education [employer].

## 2016-08-12 NOTE — Progress Notes (Signed)
Patient: Theresa Walton Female    DOB: [birth year]   50 y.o.   MRN: [id number] Visit Date: 08/12/2016  Today's Provider: Mar Daring, PA-C   Chief Complaint  Patient presents with  . Rash   Subjective:    HPI Patient is here today with c/o rash in her private area. She used a new toilet paper.She also reports that she started to talk to someone and that is looking like is going to be something serious so she wants to make sure she is good and that she is not bringing anything into the relationship. She is getting her Influenza vaccine today.    Allergies  Allergen Reactions  . Cefaclor Anaphylaxis  . Cephalosporins Anaphylaxis  . Lubiprostone     Other reaction(s): Angioedema Tongue Swelling x 2 times.   . Pseudoephedrine     Tachycardia     Current Outpatient Prescriptions:  .  FLUoxetine (PROZAC) 20 MG capsule, Take 50 mg by mouth at bedtime. , Disp: , Rfl: 0 .  glucosamine-chondroitin 500-400 MG tablet, Take 1 tablet by mouth 3 (three) times daily., Disp: , Rfl:  .  levothyroxine (SYNTHROID, LEVOTHROID) 50 MCG tablet, Take 1 tablet (50 mcg total) by mouth daily., Disp: 90 tablet, Rfl: 0 .  Melatonin 5 MG TABS, Take by mouth., Disp: , Rfl: 0 .  valACYclovir (VALTREX) 500 MG tablet, Take 1 tablet (500 mg total) by mouth daily. X 3 days and repeat as needed., Disp: 30 tablet, Rfl: 6 .  nystatin (MYCOSTATIN) 100000 UNIT/ML suspension, Take 5 mLs (500,000 Units total) by mouth 4 (four) times daily. Swish and spit. (Patient not taking: Reported on 08/12/2016), Disp: 60 mL, Rfl: 0  Review of Systems  Constitutional: Negative.   Respiratory: Negative.   Cardiovascular: Negative.   Gastrointestinal: Negative.   Genitourinary: Positive for genital sores. Negative for dysuria, flank pain, frequency, pelvic pain, urgency, vaginal bleeding, vaginal discharge and vaginal pain.  Musculoskeletal: Negative for back pain.    Social History  Substance Use Topics  .  Smoking status: Former Smoker    Types: Cigarettes    Quit date: 05/15/2015  . Smokeless tobacco: Never Used  . Alcohol use No   Objective:   BP 92/60 (BP Location: Right Arm, Patient Position: Sitting, Cuff Size: Normal)   Pulse 77   Temp 98.2 F (36.8 C) (Oral)   Resp 16   Wt 141 lb 3.2 oz (64 kg)   BMI 25.83 kg/m   Physical Exam  Constitutional: She appears well-developed and well-nourished. No distress.  Neck: Normal range of motion. Neck supple.  Cardiovascular: Normal rate, regular rhythm and normal heart sounds.  Exam reveals no gallop and no friction rub.   No murmur heard. Pulmonary/Chest: Effort normal and breath sounds normal. No respiratory distress. She has no wheezes. She has no rales.  Genitourinary:    There is rash on the right labia. There is no tenderness or lesion on the right labia. There is rash on the left labia. There is no tenderness or lesion on the left labia. There is erythema in the vagina. No tenderness or bleeding in the vagina. No foreign body in the vagina. No signs of injury around the vagina. No vaginal discharge found.  Skin: She is not diaphoretic.  Vitals reviewed.     Assessment & Plan:     1. 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 Testing done and will await results of culture. - Herpes simplex virus culture  2. Acute vaginitis Most likely from scented toilet tissue paper, but will check labs as below. Will f/u pending labs. - NuSwab Vaginitis Plus (VG+) - Herpes simplex virus culture  3. Screen for STD (sexually transmitted disease) Will check labs as below and f/u pending results. - NuSwab Vaginitis Plus (VG+) - Herpes simplex virus culture - HIV antibody (with reflex) - RPR  4. Adult hypothyroidism Stable. Diagnosis pulled for medication refill. Continue current medical  treatment plan. - levothyroxine (SYNTHROID, LEVOTHROID) 50 MCG tablet; Take 1 tablet (50 mcg total) by mouth daily.  Dispense: 90 tablet; Refill: 3  5. Need for influenza vaccination Flu vaccine given today without complication. Patient sat upright for 15 minutes to check for adverse reaction before being released. - Flu Vaccine QUAD 36+ mos IM  6. Need for Tdap vaccination Tdap Vaccine given to patient without complications. Patient sat for 15 minutes after administration and was tolerated well without adverse effects. - Tdap vaccine greater than or equal to 7yo IM       Mar Daring, PA-C  [place]

## 2016-08-13 LAB — HIV ANTIBODY (ROUTINE TESTING W REFLEX): HIV Screen 4th Generation wRfx: NONREACTIVE

## 2016-08-13 LAB — RPR: RPR Ser Ql: NONREACTIVE

## 2016-08-14 LAB — HERPES SIMPLEX VIRUS CULTURE

## 2016-08-16 ENCOUNTER — Ambulatory Visit: Payer: 59 | Admitting: Physician Assistant

## 2016-08-16 LAB — NUSWAB VAGINITIS PLUS (VG+)
Candida albicans, NAA: NEGATIVE
Candida glabrata, NAA: NEGATIVE
Chlamydia trachomatis, NAA: NEGATIVE
Neisseria gonorrhoeae, NAA: NEGATIVE
Trich vag by NAA: NEGATIVE

## 2016-09-03 ENCOUNTER — Ambulatory Visit: Payer: 59 | Admitting: Cardiology

## 2016-09-04 ENCOUNTER — Ambulatory Visit (INDEPENDENT_AMBULATORY_CARE_PROVIDER_SITE_OTHER): Payer: 59 | Admitting: Cardiology

## 2016-09-04 ENCOUNTER — Encounter: Payer: Self-pay | Admitting: Cardiology

## 2016-09-04 VITALS — BP 100/70 | HR 75 | Ht 62.0 in | Wt 139.1 lb

## 2016-09-04 DIAGNOSIS — R011 Cardiac murmur, unspecified: Secondary | ICD-10-CM | POA: Diagnosis not present

## 2016-09-04 DIAGNOSIS — R002 Palpitations: Secondary | ICD-10-CM

## 2016-09-04 DIAGNOSIS — R0602 Shortness of breath: Secondary | ICD-10-CM | POA: Diagnosis not present

## 2016-09-04 NOTE — Patient Instructions (Signed)
Testing/Procedures: Your physician has requested that you have an echocardiogram. Echocardiography is a painless test that uses sound waves to create images of your heart. It provides your doctor with information about the size and shape of your heart and how well your heart's chambers and valves are working. This procedure takes approximately one hour. There are no restrictions for this procedure.  Your physician has requested that you have a stress echocardiogram. For further information please visit [email or web address]. Please follow instruction sheet as given.   Do not drink or eat foods with caffeine for 24 hours before the test. (Chocolate, coffee, tea, or energy drinks)  If you use an inhaler, bring it with you to the test.  Do not smoke for 4 hours before the test.  Wear comfortable shoes and clothing.   Your physician has recommended that you wear a holter monitor. Holter monitors are medical devices that record the heart's electrical activity. Doctors most often use these monitors to diagnose arrhythmias. Arrhythmias are problems with the speed or rhythm of the heartbeat. The monitor is a small, portable device. You can wear one while you do your normal daily activities. This is usually used to diagnose what is causing palpitations/syncope (passing out).    Follow-Up: Your physician recommends that you schedule a follow-up appointment after testing with Dr. Yvone Neu.  It was a pleasure seeing you today here in the office. Please do not hesitate to give [place] a call back if you have any further questions. [place], BSN     Holter Monitoring A Holter monitor is a small device that is used to detect abnormal heart rhythms. It clips to your clothing and is connected by wires to flat, sticky disks (electrodes) that attach to your chest. It is worn continuously for 24-48 hours. HOME CARE INSTRUCTIONS  Wear your Holter monitor at all times, even while exercising and  sleeping, for as long as directed by your health care provider.  Make sure that the Holter monitor is safely clipped to your clothing or close to your body as recommended by your health care provider.  Do not get the monitor or wires wet.  Do not put body lotion or moisturizer on your chest.  Keep your skin clean.  Keep a diary of your daily activities, such as walking and doing chores. If you feel that your heartbeat is abnormal or that your heart is fluttering or skipping a beat:  Record what you are doing when it happens.  Record what time of day the symptoms occur.  Return your Holter monitor as directed by your health care provider.  Keep all follow-up visits as directed by your health care provider. This is important. SEEK IMMEDIATE MEDICAL CARE IF:  You feel lightheaded or you faint.  You have trouble breathing.  You feel pain in your chest, upper arm, or jaw.  You feel sick to your stomach and your skin is pale, cool, or damp.  You heartbeat feels unusual or abnormal.   This information is not intended to replace advice given to you by your health care provider. Make sure you discuss any questions you have with your health care provider.   Document Released: 07/12/2004 Document Revised: 11/04/2014 Document Reviewed: 05/23/2014 Elsevier Interactive Patient Education 2016 [employer].    Echocardiogram An echocardiogram, or echocardiography, uses sound waves (ultrasound) to produce an image of your heart. The echocardiogram is simple, painless, obtained within a short period of time, and offers valuable information to your health  care provider. The images from an echocardiogram can provide information such as:  Evidence of coronary artery disease (CAD).  Heart size.  Heart muscle function.  Heart valve function.  Aneurysm detection.  Evidence of a past heart attack.  Fluid buildup around the heart.  Heart muscle thickening.  Assess heart valve  function. LET [facility] CARE PROVIDER KNOW ABOUT:  Any allergies you have.  All medicines you are taking, including vitamins, herbs, eye drops, creams, and over-the-counter medicines.  Previous problems you or members of your family have had with the use of anesthetics.  Any blood disorders you have.  Previous surgeries you have had.  Medical conditions you have.  Possibility of pregnancy, if this applies. BEFORE THE PROCEDURE  No special preparation is needed. Eat and drink normally.  PROCEDURE   In order to produce an image of your heart, gel will be applied to your chest and a wand-like tool (transducer) will be moved over your chest. The gel will help transmit the sound waves from the transducer. The sound waves will harmlessly bounce off your heart to allow the heart images to be captured in real-time motion. These images will then be recorded.  You may need an IV to receive a medicine that improves the quality of the pictures. AFTER THE PROCEDURE You may return to your normal schedule including diet, activities, and medicines, unless your health care provider tells you otherwise.   This information is not intended to replace advice given to you by your health care provider. Make sure you discuss any questions you have with your health care provider.   Document Released: 10/11/2000 Document Revised: 11/04/2014 Document Reviewed: 06/21/2013 Elsevier Interactive Patient Education 2016 [employer].   Exercise Stress Echocardiogram An exercise stress echocardiogram is a heart (cardiac) test used to check the function of your heart. This test may also be called an exercise stress echocardiography or stress echo. This stress test will check how well your heart muscle and valves are working and determine if your heart muscle is getting enough blood. You will exercise on a treadmill to naturally increase or stress the functioning of your heart.  An echocardiogram uses sound waves  (ultrasound) to produce an image of your heart. If your heart does not work normally, it may indicate coronary artery disease with poor coronary blood supply. The coronary arteries are the arteries that bring blood and oxygen to your heart. LET [facility] CARE PROVIDER KNOW ABOUT:  Any allergies you have.  All medicines you are taking, including vitamins, herbs, eye drops, creams, and over-the-counter medicines.  Previous problems you or members of your family have had with the use of anesthetics.  Any blood disorders you have.  Previous surgeries you have had.  Medical conditions you have.  Possibility of pregnancy, if this applies. RISKS AND COMPLICATIONS Generally, this is a safe procedure. However, as with any procedure, complications can occur. Possible complications can include:  You develop pain or pressure in the following areas:  Chest.  Jaw or neck.  Between your shoulder blades.  Radiating down your left arm.  Dizziness or lightheadedness.  Shortness of breath.  Increased or irregular heartbeat.  Nausea or vomiting.  Heart attack (rare). BEFORE THE PROCEDURE  Avoid all forms of caffeine for 24 hours before your test or as directed by your health care provider. This includes coffee, tea (even decaffeinated tea), caffeinated sodas, chocolate, cocoa, and certain pain medicines.  Follow your health care provider's instructions regarding eating and drinking  before the test.  Take your medicines as directed at regular times with water unless instructed otherwise. Exceptions may include:  If you have diabetes, ask how you are to take your insulin or pills. It is common to adjust insulin dosing the morning of the test.  If you are taking beta-blocker medicines, it is important to talk to your health care provider about these medicines well before the date of your test. Taking beta-blocker medicines may interfere with the test. In some cases, these medicines need to  be changed or stopped 24 hours or more before the test.  If you wear a nitroglycerin patch, it may need to be removed prior to the test. Ask your health care provider if the patch should be removed before the test.  If you use an inhaler for any breathing condition, bring it with you to the test.  If you are an outpatient, bring a snack so you can eat right after the stress phase of the test.  Do not smoke for 4 hours prior to the test or as directed by your health care provider.  Wear loose-fitting clothes and comfortable shoes for the test. This test involves walking on a treadmill. PROCEDURE   Multiple electrodes will be put on your chest. If needed, small areas of your chest may be shaved to get better contact with the electrodes. Once the electrodes are attached to your body, multiple wires will be attached to the electrodes, and your heart rate will be monitored.  You will have an echocardiogram done at rest.  To produce this image of your heart, gel is applied to your chest, and a wand-like tool (transducer) is moved over the chest. The transducer sends the sound waves through the chest to create the moving images of your heart.  You may need an IV to receive a medication that improves the quality of the pictures.  You will then walk on a treadmill. The treadmill will be started at a slow pace. The treadmill speed and incline will gradually be increased to raise your heart rate.  At the peak of exercise, the treadmill will be stopped. You will lie down immediately on a bed so that a second echocardiogram can be done to visualize your heart's motion with exercise.  The test usually takes 30-60 minutes to complete. AFTER THE PROCEDURE  Your heart rate and blood pressure will be monitored after the test.  You may return to your normal schedule, including diet, activities, and medicines, unless your health care provider tells you otherwise.   This information is not intended to  replace advice given to you by your health care provider. Make sure you discuss any questions you have with your health care provider.   Document Released: 10/18/2004 Document Revised: 10/19/2013 Document Reviewed: 06/21/2013 Elsevier Interactive Patient Education [employer].

## 2016-09-04 NOTE — Progress Notes (Signed)
Cardiology Office Note   Date:  09/04/2016   ID:  Theresa Walton, DOB 1966/01/14, MRN [id number]  Referring Doctor:  Lelon Huh, MD   Cardiologist:   Wende Bushy, MD   Reason for consultation:  Chief Complaint  Patient presents with  . New Patient (Initial Visit)    pt sts that she feel that her heart is jumpojng around in her chest/ having a hard time taking a deep breath      History of Present Illness: Theresa Walton is a 50 y.o. female who presents for Palpitations. Symptoms started about 7 weeks ago. She describes the palpitations as a flip-flopping sensation in the center of her chest, nonradiating. Intensity is moderate in intensity. Lasting for minutes at a time, resolving spontaneously. She cannot attribute it to anything in particular. There is associated shortness of breath given with this. She denies chest pain or chest heaviness.  Patient also brings up stress shortness of breath with exertion, moderate intensity, lasting minutes at a time, resolved with rest. Ongoing for several weeks now as well.  Patient denies PND, orthopnea, edema. No abdominal pain. No loss of consciousness.   ROS:  Please see the history of present illness. Aside from mentioned under HPI, all other systems are reviewed and negative.     Past Medical History:  Diagnosis Date  . 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 Genital herpes   . Seasonal allergies   . Syncope and collapse   . Thyroid disease    hypothyroidism    Past Surgical History:  Procedure Laterality Date  . ABDOMINAL HYSTERECTOMY    . ANTERIOR CRUCIATE LIGAMENT REPAIR Right 2012  . ARTHROSCOPIC REPAIR ACL  2013   Right knee  . AUGMENTATION MAMMAPLASTY Bilateral 2000   saline  . BREAST ENHANCEMENT SURGERY  1995  . CYSTOCELE REPAIR  2013  . ENDOMETRIAL ABLATION  2013  . [place], 2001  . PARTIAL HYSTERECTOMY  2015  . PARTIAL HYSTERECTOMY Bilateral 2014  . RECTOPEXY  2011  .  TONSILECTOMY, ADENOIDECTOMY, BILATERAL MYRINGOTOMY AND TUBES  1984     reports that she quit smoking about 15 months ago. Her smoking use included Cigarettes. She has never used smokeless tobacco. She reports that she does not drink alcohol or use drugs.   family history includes Arrhythmia in her father; Healthy in her brother, sister, and sister; Heart disease in her father; Lung cancer in her paternal grandmother; Pulmonary fibrosis in her mother.   Outpatient Medications Prior to Visit  Medication Sig Dispense Refill  . FLUoxetine (PROZAC) 20 MG capsule Take 50 mg by mouth at bedtime.   0  . glucosamine-chondroitin 500-400 MG tablet Take 1 tablet by mouth 3 (three) times daily.    Marland Kitchen levothyroxine (SYNTHROID, LEVOTHROID) 50 MCG tablet Take 1 tablet (50 mcg total) by mouth daily. 90 tablet 3  . Melatonin 5 MG TABS Take by mouth.  0  . nystatin (MYCOSTATIN) 100000 UNIT/ML suspension Take 5 mLs (500,000 Units total) by mouth 4 (four) times daily. Swish and spit. (Patient not taking: Reported on 08/12/2016) 60 mL 0  . valACYclovir (VALTREX) 500 MG tablet Take 1 tablet (500 mg total) by mouth daily. X 3 days and repeat as needed. 30 tablet 6   No facility-administered medications prior to visit.      Allergies: Cefaclor; Cephalosporins; Lubiprostone; and Pseudoephedrine    PHYSICAL EXAM: VS:  BP 100/70   Pulse 75   Ht 5\' 2"  (1.575  m)   Wt 139 lb 1.9 oz (63.1 kg)   BMI 25.45 kg/m  , Body mass index is 25.45 kg/m. Wt Readings from Last 3 Encounters:  09/04/16 139 lb 1.9 oz (63.1 kg)  08/12/16 141 lb 3.2 oz (64 kg)  07/16/16 141 lb (64 kg)    GENERAL:  well developed, well nourished, not in acute distress HEENT: normocephalic, pink conjunctivae, anicteric sclerae, no xanthelasma, normal dentition, oropharynx clear NECK:  no neck vein engorgement, JVP normal, no hepatojugular reflux, carotid upstroke brisk and symmetric, no bruit, no thyromegaly, no lymphadenopathy LUNGS:  good  respiratory effort, clear to auscultation bilaterally CV:  PMI not displaced, no thrills, no lifts, S1 and S2 within normal limits, no palpable S3 or S4, Systolic murmur 3 out of 6 , no rubs, no gallops ABD:  Soft, nontender, nondistended, normoactive bowel sounds, no abdominal aortic bruit, no hepatomegaly, no splenomegaly MS: nontender back, no kyphosis, no scoliosis, no joint deformities EXT:  2+ DP/PT pulses, no edema, no varicosities, no cyanosis, no clubbing SKIN: warm, nondiaphoretic, normal turgor, no ulcers NEUROPSYCH: alert, oriented to person, place, and time, sensory/motor grossly intact, normal mood, appropriate affect  Recent Labs: 01/01/2016: ALT 11; BUN 11; Creatinine, Ser 0.64; Potassium 4.0; Sodium 141 07/05/2016: Platelets 294; TSH 1.640   Lipid Panel No results found for: CHOL, TRIG, HDL, CHOLHDL, VLDL, LDLCALC, LDLDIRECT   Other studies Reviewed:  EKG:  The ekg from 09/04/2016 was personally reviewed by me and it revealed sinus rhythm, 75 BPM  Additional studies/ records that were reviewed personally reviewed by me today include: None available   ASSESSMENT AND PLAN: Palpitations Recommend further evaluation with echocardiogram and Holter monitor 2 weeks  Shortness of breath Recommend further evaluation with echocardiogram and stress echocardiogram.   Current medicines are reviewed at length with the patient today.  The patient does not have concerns regarding medicines.  Labs/ tests ordered today include: No orders of the defined types were placed in this encounter.   I had a lengthy and detailed discussion with the patient regarding diagnoses, prognosis, diagnostic options, treatment options , and side effects of medications.   I counseled the patient on importance of lifestyle modification including heart healthy diet, regular physical activity  , and smoking cessation.   Disposition:   FU with undersigned after tests   Patient requested that her tests  be done January 2018. She does not have severe symptoms. She is aware that if symptoms get worse, she may need to go to the ER or get in touch with our office.   Signed, Wende Bushy, MD  09/04/2016 11:30 AM    [place]  This note was generated in part with voice recognition software and I apologize for any typographical errors that were not detected and corrected.

## 2016-09-12 ENCOUNTER — Encounter: Payer: Self-pay | Admitting: Physician Assistant

## 2016-09-12 MED ORDER — LEVOTHYROXINE SODIUM 25 MCG PO TABS: 25.0000 ug | ORAL_TABLET | Freq: Every day | ORAL | 1 refills | Status: DC

## 2016-09-18 ENCOUNTER — Encounter: Payer: Self-pay | Admitting: Cardiology

## 2016-10-30 ENCOUNTER — Other Ambulatory Visit: Payer: 59

## 2016-11-11 ENCOUNTER — Telehealth: Payer: Self-pay | Admitting: Physician Assistant

## 2016-11-11 NOTE — Telephone Encounter (Signed)
Push fluids, cranberry juice, azo tabs. Tylenol for pain or fevers

## 2016-11-11 NOTE — Telephone Encounter (Signed)
Pt advised and states she has been doing all of this. Appointment made for tomorrow 11/12/16-aa

## 2016-11-11 NOTE — Telephone Encounter (Signed)
Pt thinks she has another UTI.  There are no appt's until tomorrow at 11:30.  Please advise.  Her call back is [phone]  Thanks Con Memos

## 2016-11-12 ENCOUNTER — Ambulatory Visit: Payer: Self-pay | Admitting: Physician Assistant

## 2016-11-19 ENCOUNTER — Encounter: Payer: Self-pay | Admitting: Physician Assistant

## 2016-11-19 ENCOUNTER — Ambulatory Visit (INDEPENDENT_AMBULATORY_CARE_PROVIDER_SITE_OTHER): Payer: 59 | Admitting: Physician Assistant

## 2016-11-19 VITALS — BP 100/68 | HR 84 | Temp 98.3°F | Resp 16

## 2016-11-19 DIAGNOSIS — N3090 Cystitis, unspecified without hematuria: Secondary | ICD-10-CM

## 2016-11-19 DIAGNOSIS — J019 Acute sinusitis, unspecified: Secondary | ICD-10-CM | POA: Diagnosis not present

## 2016-11-19 DIAGNOSIS — E038 Other specified hypothyroidism: Secondary | ICD-10-CM

## 2016-11-19 LAB — POCT URINALYSIS DIPSTICK
Bilirubin, UA: NEGATIVE
Glucose, UA: NEGATIVE
Ketones, UA: NEGATIVE
Spec Grav, UA: 1.01
Urobilinogen, UA: 0.2
pH, UA: 6.5

## 2016-11-19 MED ORDER — DOXYCYCLINE HYCLATE 100 MG PO TABS: 100.0000 mg | ORAL_TABLET | Freq: Two times a day (BID) | ORAL | 0 refills | Status: DC

## 2016-11-19 NOTE — Patient Instructions (Signed)

## 2016-11-19 NOTE — Progress Notes (Signed)
Patient: Theresa Walton Female    DOB: 25-Jun-1966   51 y.o.   MRN: [id number] Visit Date: 11/19/2016  Today's Provider: Trinna Post, PA-C   Chief Complaint  Patient presents with  . Urinary Tract Infection  . Sinusitis    Has been going on since Christmas time.    Subjective:    Urinary Tract Infection   This is a recurrent problem. The current episode started more than 1 month ago (Pt reports it started before Christmas). There has been no fever. She is sexually active. There is no history of pyelonephritis. Associated symptoms include frequency and nausea. Pertinent negatives include no chills, flank pain, hematuria, urgency or vomiting. She has tried antibiotics for the symptoms. The treatment provided no relief.  Sinusitis  This is a chronic problem. The current episode started more than 1 month ago. There has been no fever. Associated symptoms include congestion, headaches, sinus pressure and a sore throat. Pertinent negatives include no chills, coughing, diaphoresis, ear pain, hoarse voice, shortness of breath or sneezing. Past treatments include antibiotics and oral decongestants.   Patient is 51 y/o w/ hx of hypothyroidism on Synthroid presenting today with urinary sx and sinus congestion. Has had UTI since Christmas. Treated by [facility] through work where she was on [employer]. Then developed respiratory sx and was told to switch to Z-pack which would cover both. Patient did with no relief. She then ended up finishing her Macrobid. Then she went to [facility] where she was put on Cipro 250 mg BID x 7 days. This did not provide relief either and presents to clinic today with continuation of dysuria, urinary frequency, suprapubic tenderness.  Patient also reports that she is having some redness on her neck and bumps which she had last time her thyroid levels were off and would like them checked today.      Allergies  Allergen Reactions  . Cefaclor Anaphylaxis  .  Cephalosporins Anaphylaxis  . Lubiprostone     Other reaction(s): Angioedema Tongue Swelling x 2 times.   . Pseudoephedrine     Tachycardia     Current Outpatient Prescriptions:  .  glucosamine-chondroitin 500-400 MG tablet, Take 1 tablet by mouth 3 (three) times daily., Disp: , Rfl:  .  levothyroxine (SYNTHROID, LEVOTHROID) 25 MCG tablet, Take 1 tablet (25 mcg total) by mouth daily before breakfast., Disp: 90 tablet, Rfl: 1 .  Melatonin 5 MG TABS, Take by mouth., Disp: , Rfl: 0 .  FLUoxetine (PROZAC) 20 MG capsule, Take 50 mg by mouth at bedtime. , Disp: , Rfl: 0  Review of Systems  Constitutional: Positive for fatigue ("Extreme fatigue"). Negative for activity change, appetite change, chills, diaphoresis, fever and unexpected weight change.  HENT: Positive for congestion, nosebleeds, postnasal drip, sinus pain, sinus pressure, sore throat and tinnitus. Negative for ear discharge, ear pain, hoarse voice, rhinorrhea, sneezing and voice change.   Eyes: Negative.   Respiratory: Negative.  Negative for cough and shortness of breath.   Gastrointestinal: Positive for nausea. Negative for abdominal distention, abdominal pain, anal bleeding, blood in stool, constipation, diarrhea, rectal pain and vomiting.  Endocrine: Positive for heat intolerance. Negative for cold intolerance, polydipsia, polyphagia and polyuria.  Genitourinary: Positive for frequency. Negative for decreased urine volume, difficulty urinating, dyspareunia, dysuria, enuresis, flank pain, genital sores, hematuria, menstrual problem, pelvic pain, urgency, vaginal bleeding, vaginal discharge and vaginal pain.  Neurological: Positive for headaches. Negative for dizziness and light-headedness.    Social History  Substance Use  Topics  . Smoking status: Former Smoker    Types: Cigarettes    Quit date: 05/15/2015  . Smokeless tobacco: Never Used  . Alcohol use No   Objective:   BP 100/68 (BP Location: Left Arm, Patient Position:  Sitting, Cuff Size: Normal)   Pulse 84   Temp 98.3 F (36.8 C) (Oral)   Resp 16   Physical Exam  Constitutional: She is oriented to person, place, and time. She appears well-developed and well-nourished. No distress.  HENT:  Nose: Right sinus exhibits maxillary sinus tenderness and frontal sinus tenderness. Left sinus exhibits maxillary sinus tenderness and frontal sinus tenderness.  Mouth/Throat: Oropharynx is clear and moist. No oropharyngeal exudate.  Neck: Neck supple. No thyromegaly present.  Cardiovascular: Normal rate and regular rhythm.   Pulmonary/Chest: Effort normal and breath sounds normal.  Abdominal: Soft. Bowel sounds are normal. She exhibits no distension. There is tenderness in the suprapubic area. There is no rebound, no guarding and no CVA tenderness.  Lymphadenopathy:    She has no cervical adenopathy.  Neurological: She is alert and oriented to person, place, and time.  Skin: Skin is warm and dry. She is not diaphoretic.  Psychiatric: She has a normal mood and affect. Her behavior is normal.        Assessment & Plan:     1. Cystitis  Evaluate and treat as below. Will await culture.  - POCT urinalysis dipstick - Urine Culture - doxycycline (VIBRA-TABS) 100 MG tablet; Take 1 tablet (100 mg total) by mouth 2 (two) times daily.  Dispense: 20 tablet; Refill: 0  2. Acute non-recurrent sinusitis, unspecified location  Treat as below.  - doxycycline (VIBRA-TABS) 100 MG tablet; Take 1 tablet (100 mg total) by mouth 2 (two) times daily.  Dispense: 20 tablet; Refill: 0  3. Other specified hypothyroidism  Will evaluate as below.  - T4 AND TSH  Return if symptoms worsen or fail to improve.   Patient Instructions  Urinary Tract Infection, Adult A urinary tract infection (UTI) is an infection of any part of the urinary tract, which includes the kidneys, ureters, bladder, and urethra. These organs make, store, and get rid of urine in the body. UTI can be a  bladder infection (cystitis) or kidney infection (pyelonephritis). What are the causes? This infection may be caused by fungi, viruses, or bacteria. Bacteria are the most common cause of UTIs. This condition can also be caused by repeated incomplete emptying of the bladder during urination. What increases the risk? This condition is more likely to develop if:  You ignore your need to urinate or hold urine for long periods of time.  You do not empty your bladder completely during urination.  You wipe back to front after urinating or having a bowel movement, if you are female.  You are uncircumcised, if you are female.  You are constipated.  You have a urinary catheter that stays in place (indwelling).  You have a weak defense (immune) system.  You have a medical condition that affects your bowels, kidneys, or bladder.  You have diabetes.  You take antibiotic medicines frequently or for long periods of time, and the antibiotics no longer work well against certain types of infections (antibiotic resistance).  You take medicines that irritate your urinary tract.  You are exposed to chemicals that irritate your urinary tract.  You are female. What are the signs or symptoms? Symptoms of this condition include:  Fever.  Frequent urination or passing small amounts of urine frequently.  Needing to urinate urgently.  Pain or burning with urination.  Urine that smells bad or unusual.  Cloudy urine.  Pain in the lower abdomen or back.  Trouble urinating.  Blood in the urine.  Vomiting or being less hungry than normal.  Diarrhea or abdominal pain.  Vaginal discharge, if you are female. How is this diagnosed? This condition is diagnosed with a medical history and physical exam. You will also need to provide a urine sample to test your urine. Other tests may be done, including:  Blood tests.  Sexually transmitted disease (STD) testing. If you have had more than one UTI,  a cystoscopy or imaging studies may be done to determine the cause of the infections. How is this treated? Treatment for this condition often includes a combination of two or more of the following:  Antibiotic medicine.  Other medicines to treat less common causes of UTI.  Over-the-counter medicines to treat pain.  Drinking enough water to stay hydrated. Follow these instructions at home:  Take over-the-counter and prescription medicines only as told by your health care provider.  If you were prescribed an antibiotic, take it as told by your health care provider. Do not stop taking the antibiotic even if you start to feel better.  Avoid alcohol, caffeine, tea, and carbonated beverages. They can irritate your bladder.  Drink enough fluid to keep your urine clear or pale yellow.  Keep all follow-up visits as told by your health care provider. This is important.  Make sure to:  Empty your bladder often and completely. Do not hold urine for long periods of time.  Empty your bladder before and after sex.  Wipe from front to back after a bowel movement if you are female. Use each tissue one time when you wipe. Contact a health care provider if:  You have back pain.  You have a fever.  You feel nauseous or vomit.  Your symptoms do not get better after 3 days.  Your symptoms go away and then return. Get help right away if:  You have severe back pain or lower abdominal pain.  You are vomiting and cannot keep down any medicines or water. This information is not intended to replace advice given to you by your health care provider. Make sure you discuss any questions you have with your health care provider. Document Released: 07/24/2005 Document Revised: 03/27/2016 Document Reviewed: 09/04/2015 Elsevier Interactive Patient Education  2017 [employer].   The entirety of the information documented in the History of Present Illness, Review of Systems and Physical Exam were  personally obtained by me. Portions of this information were initially documented by Ashley Royalty, CMA and reviewed by me for thoroughness and accuracy.         Trinna Post, PA-C  [facility]

## 2016-11-20 LAB — T4 AND TSH
T4, Total: 6.8 ug/dL (ref 4.5–12.0)
TSH: 1.36 u[IU]/mL (ref 0.450–4.500)

## 2016-11-22 ENCOUNTER — Ambulatory Visit (INDEPENDENT_AMBULATORY_CARE_PROVIDER_SITE_OTHER): Payer: 59 | Admitting: Physician Assistant

## 2016-11-22 ENCOUNTER — Encounter: Payer: Self-pay | Admitting: Physician Assistant

## 2016-11-22 ENCOUNTER — Telehealth: Payer: Self-pay

## 2016-11-22 VITALS — BP 104/62 | HR 68 | Temp 97.6°F | Resp 16 | Wt 137.0 lb

## 2016-11-22 DIAGNOSIS — R110 Nausea: Secondary | ICD-10-CM

## 2016-11-22 DIAGNOSIS — N3090 Cystitis, unspecified without hematuria: Secondary | ICD-10-CM | POA: Diagnosis not present

## 2016-11-22 LAB — URINE CULTURE

## 2016-11-22 LAB — PLEASE NOTE

## 2016-11-22 MED ORDER — NITROFURANTOIN MONOHYD MACRO 100 MG PO CAPS: 100.0000 mg | ORAL_CAPSULE | Freq: Two times a day (BID) | ORAL | 0 refills | Status: AC

## 2016-11-22 MED ORDER — PROMETHAZINE HCL 12.5 MG PO TABS: 12.5000 mg | ORAL_TABLET | Freq: Four times a day (QID) | ORAL | 0 refills | Status: DC | PRN

## 2016-11-22 NOTE — Progress Notes (Signed)
Patient: Theresa Walton Female    DOB: 08-15-1966   51 y.o.   MRN: [id number] Visit Date: 11/22/2016  Today's Provider: Trinna Post, PA-C   Chief Complaint  Patient presents with  . Urinary Tract Infection   Subjective:    HPI  Patient is 51 y/o woman who is here for follow up UTI. 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 She had been on doxycycline for this and not improving. Patient is allergic to cephalosporins, which E. Coli is sensitive to. Patient reports she feels worse. She feels fatigued, nauseas. No fevers or vomiting. No abdominal pain or flank pain. She has been out of work recently.     Allergies  Allergen Reactions  . Cefaclor Anaphylaxis  . Cephalosporins Anaphylaxis  . Lubiprostone     Other reaction(s): Angioedema Tongue Swelling x 2 times.   . Pseudoephedrine     Tachycardia     Current Outpatient Prescriptions:  .  doxycycline (VIBRA-TABS) 100 MG tablet, Take 1 tablet (100 mg total) by mouth 2 (two) times daily., Disp: 20 tablet, Rfl: 0 .  FLUoxetine (PROZAC) 20 MG capsule, Take 50 mg by mouth at bedtime. , Disp: , Rfl: 0 .  glucosamine-chondroitin 500-400 MG tablet, Take 1 tablet by mouth 3 (three) times daily., Disp: , Rfl:  .  levothyroxine (SYNTHROID, LEVOTHROID) 25 MCG tablet, Take 1 tablet (25 mcg total) by mouth daily before breakfast., Disp: 90 tablet, Rfl: 1 .  Melatonin 5 MG TABS, Take by mouth., Disp: , Rfl: 0 .  nitrofurantoin, macrocrystal-monohydrate, (MACROBID) 100 MG capsule, Take 1 capsule (100 mg total) by mouth 2 (two) times daily., Disp: 14 capsule, Rfl: 0 .  promethazine (PHENERGAN) 12.5 MG tablet, Take 1 tablet (12.5 mg total) by mouth every 6 (six) hours as needed for nausea or vomiting., Disp: 30 tablet, Rfl: 0  Review of Systems  Constitutional: Positive for chills and fatigue. Negative for activity change, appetite change, diaphoresis, fever and unexpected weight  change.  HENT: Positive for congestion, ear pain, sinus pain, sinus pressure and tinnitus. Negative for ear discharge, nosebleeds, postnasal drip, rhinorrhea, sneezing and sore throat.   Eyes: Negative.   Respiratory: Positive for wheezing. Negative for apnea, cough, choking, chest tightness, shortness of breath and stridor.   Gastrointestinal: Positive for constipation and nausea. Negative for abdominal distention, abdominal pain, anal bleeding, blood in stool, diarrhea, rectal pain and vomiting.  Genitourinary: Negative for decreased urine volume, difficulty urinating, dyspareunia, dysuria, frequency, hematuria, urgency, vaginal bleeding, vaginal discharge and vaginal pain.  Musculoskeletal: Positive for myalgias. Negative for arthralgias, back pain, gait problem, joint swelling, neck pain and neck stiffness.  Neurological: Negative for dizziness, light-headedness and headaches.    Social History  Substance Use Topics  . Smoking status: Former Smoker    Types: Cigarettes    Quit date: 05/15/2015  . Smokeless tobacco: Never Used  . Alcohol use No   Objective:   BP 104/62 (BP Location: Left Arm, Patient Position: Sitting, Cuff Size: Normal)   Pulse 68   Temp 97.6 F (36.4 C) (Oral)   Resp 16   Wt 137 lb (62.1 kg)   BMI 25.06 kg/m   Physical Exam  Constitutional: She is oriented to person, place, and time. She appears well-developed and well-nourished.  Non-toxic appearance. She does not have a sickly appearance. No distress.  Cardiovascular: Normal rate and regular rhythm.   Pulmonary/Chest: Effort normal and breath sounds  normal.  Abdominal: Soft. Bowel sounds are normal. She exhibits no distension. There is tenderness in the suprapubic area. There is no rebound, no guarding and no CVA tenderness.  Neurological: She is alert and oriented to person, place, and time.  Skin: Skin is warm and dry. She is not diaphoretic.  Psychiatric: She has a normal mood and affect. Her behavior is  normal.        Assessment & Plan:     1. Cystitis  Patient presenting with cystitis 2/2 E. Coli. Normal vital signs, patient is non toxic appearing. According to patient, this culture is different from her previous cx [facility], so possible this is separate infection. Only PO outpatient drug that pt is not allergic to and to which bacteria is sensitive is Macrobid. Will treat as below. Counseled patient that if she worsens, there is not much other outpt meds that will work and she should go to the hospital. Have printed out culture report for her to take with her.  - nitrofurantoin, macrocrystal-monohydrate, (MACROBID) 100 MG capsule; Take 1 capsule (100 mg total) by mouth 2 (two) times daily.  Dispense: 14 capsule; Refill: 0  2. Nausea  Treat as below.  - promethazine (PHENERGAN) 12.5 MG tablet; Take 1 tablet (12.5 mg total) by mouth every 6 (six) hours as needed for nausea or vomiting.  Dispense: 30 tablet; Refill: 0  Return in about 4 days (around 11/26/2016).   Patient Instructions  Urinary Tract Infection, Adult A urinary tract infection (UTI) is an infection of any part of the urinary tract, which includes the kidneys, ureters, bladder, and urethra. These organs make, store, and get rid of urine in the body. UTI can be a bladder infection (cystitis) or kidney infection (pyelonephritis). What are the causes? This infection may be caused by fungi, viruses, or bacteria. Bacteria are the most common cause of UTIs. This condition can also be caused by repeated incomplete emptying of the bladder during urination. What increases the risk? This condition is more likely to develop if:  You ignore your need to urinate or hold urine for long periods of time.  You do not empty your bladder completely during urination.  You wipe back to front after urinating or having a bowel movement, if you are female.  You are uncircumcised, if you are female.  You are constipated.  You have a  urinary catheter that stays in place (indwelling).  You have a weak defense (immune) system.  You have a medical condition that affects your bowels, kidneys, or bladder.  You have diabetes.  You take antibiotic medicines frequently or for long periods of time, and the antibiotics no longer work well against certain types of infections (antibiotic resistance).  You take medicines that irritate your urinary tract.  You are exposed to chemicals that irritate your urinary tract.  You are female. What are the signs or symptoms? Symptoms of this condition include:  Fever.  Frequent urination or passing small amounts of urine frequently.  Needing to urinate urgently.  Pain or burning with urination.  Urine that smells bad or unusual.  Cloudy urine.  Pain in the lower abdomen or back.  Trouble urinating.  Blood in the urine.  Vomiting or being less hungry than normal.  Diarrhea or abdominal pain.  Vaginal discharge, if you are female. How is this diagnosed? This condition is diagnosed with a medical history and physical exam. You will also need to provide a urine sample to test your urine. Other  tests may be done, including:  Blood tests.  Sexually transmitted disease (STD) testing. If you have had more than one UTI, a cystoscopy or imaging studies may be done to determine the cause of the infections. How is this treated? Treatment for this condition often includes a combination of two or more of the following:  Antibiotic medicine.  Other medicines to treat less common causes of UTI.  Over-the-counter medicines to treat pain.  Drinking enough water to stay hydrated. Follow these instructions at home:  Take over-the-counter and prescription medicines only as told by your health care provider.  If you were prescribed an antibiotic, take it as told by your health care provider. Do not stop taking the antibiotic even if you start to feel better.  Avoid alcohol,  caffeine, tea, and carbonated beverages. They can irritate your bladder.  Drink enough fluid to keep your urine clear or pale yellow.  Keep all follow-up visits as told by your health care provider. This is important.  Make sure to:  Empty your bladder often and completely. Do not hold urine for long periods of time.  Empty your bladder before and after sex.  Wipe from front to back after a bowel movement if you are female. Use each tissue one time when you wipe. Contact a health care provider if:  You have back pain.  You have a fever.  You feel nauseous or vomit.  Your symptoms do not get better after 3 days.  Your symptoms go away and then return. Get help right away if:  You have severe back pain or lower abdominal pain.  You are vomiting and cannot keep down any medicines or water. This information is not intended to replace advice given to you by your health care provider. Make sure you discuss any questions you have with your health care provider. Document Released: 07/24/2005 Document Revised: 03/27/2016 Document Reviewed: 09/04/2015 Elsevier Interactive Patient Education  2017 [employer].   The entirety of the information documented in the History of Present Illness, Review of Systems and Physical Exam were personally obtained by me. Portions of this information were initially documented by Ashley Royalty, CMA and reviewed by me for thoroughness and accuracy.        Trinna Post, PA-C  [facility]

## 2016-11-22 NOTE — Telephone Encounter (Signed)
Patient called and states that she was seen earlier this week for UTI and was started on Doxy. She states she is not better and is actually worse. Please let patient know what to do. Thank you. CB: [id number]

## 2016-11-22 NOTE — Patient Instructions (Signed)

## 2016-11-25 ENCOUNTER — Ambulatory Visit (INDEPENDENT_AMBULATORY_CARE_PROVIDER_SITE_OTHER): Payer: 59 | Admitting: Physician Assistant

## 2016-11-25 ENCOUNTER — Encounter: Payer: Self-pay | Admitting: Physician Assistant

## 2016-11-25 VITALS — BP 106/60 | HR 68 | Temp 97.9°F | Resp 16 | Wt 139.0 lb

## 2016-11-25 DIAGNOSIS — R5383 Other fatigue: Secondary | ICD-10-CM

## 2016-11-25 DIAGNOSIS — N3090 Cystitis, unspecified without hematuria: Secondary | ICD-10-CM

## 2016-11-25 LAB — POCT URINALYSIS DIPSTICK
Bilirubin, UA: NEGATIVE
Blood, UA: NEGATIVE
Glucose, UA: NEGATIVE
Ketones, UA: NEGATIVE
Leukocytes, UA: NEGATIVE
Nitrite, UA: NEGATIVE
Protein, UA: NEGATIVE
Spec Grav, UA: <=1.005
Urobilinogen, UA: 0.2
pH, UA: 8

## 2016-11-25 NOTE — Progress Notes (Signed)
Patient: Theresa Walton Female    DOB: 06/05/1966   51 y.o.   MRN: [id number] Visit Date: 11/26/2016  Today's Provider: Trinna Post, PA-C   Chief Complaint  Patient presents with  . Urinary Tract Infection    Follow-up   Subjective:    Urinary Tract Infection   The current episode started 1 to 4 weeks ago. The problem has been gradually improving (Pt reports a 15% improvement. ). The patient is experiencing no pain. There has been no fever. She is sexually active. There is no history of pyelonephritis. Associated symptoms include flank pain (Left sided.Marland KitchenMarland KitchenStarted Friday) and nausea. Pertinent negatives include no chills, hematuria, urgency or vomiting.   Patient is 51 y/o woman with history of bladder prolapse/sling with at least two separate cystitis episodes in past month. Brought in lab work today, had Klebsiella pneumonia [facility]. Had E. Coli cystitis treated here with BFP, first with doxycycline and then with Macrobid 2/2 multidrug resistance and drug allergies. Patient reports she is feeling better somewhat today though is still fatigued. Has some left sided flank pain without nausea, vomiting, diarrhea. Not sure if it's from laying around all day which she has been doing. Is sexually active recently, no discharge. No fever, chills. Very concerned about cause of this since she hasn't had UTI in years.    Allergies  Allergen Reactions  . Cefaclor Anaphylaxis  . Cephalosporins Anaphylaxis  . Lubiprostone     Other reaction(s): Angioedema Tongue Swelling x 2 times.   . Pseudoephedrine     Tachycardia     Current Outpatient Prescriptions:  .  FLUoxetine (PROZAC) 20 MG capsule, Take 50 mg by mouth at bedtime. , Disp: , Rfl: 0 .  glucosamine-chondroitin 500-400 MG tablet, Take 1 tablet by mouth 3 (three) times daily., Disp: , Rfl:  .  levothyroxine (SYNTHROID, LEVOTHROID) 25 MCG tablet, Take 1 tablet (25 mcg total) by mouth daily before  breakfast., Disp: 90 tablet, Rfl: 1 .  Melatonin 5 MG TABS, Take by mouth., Disp: , Rfl: 0 .  nitrofurantoin, macrocrystal-monohydrate, (MACROBID) 100 MG capsule, Take 1 capsule (100 mg total) by mouth 2 (two) times daily., Disp: 14 capsule, Rfl: 0 .  promethazine (PHENERGAN) 12.5 MG tablet, Take 1 tablet (12.5 mg total) by mouth every 6 (six) hours as needed for nausea or vomiting., Disp: 30 tablet, Rfl: 0  Review of Systems  Constitutional: Positive for appetite change (Not much of an appetite) and fatigue. Negative for activity change, chills, diaphoresis, fever and unexpected weight change.  Gastrointestinal: Positive for constipation and nausea. Negative for abdominal distention, abdominal pain, anal bleeding, blood in stool, diarrhea, rectal pain and vomiting.  Genitourinary: Positive for dysuria and flank pain (Left sided.Marland KitchenMarland KitchenStarted Friday). Negative for decreased urine volume, difficulty urinating, hematuria, urgency, vaginal bleeding, vaginal discharge and vaginal pain.  Musculoskeletal: Positive for back pain.  Neurological: Positive for weakness.    Social History  Substance Use Topics  . Smoking status: Former Smoker    Types: Cigarettes    Quit date: 05/15/2015  . Smokeless tobacco: Never Used  . Alcohol use No   Objective:   BP 106/60 (BP Location: Left Arm, Patient Position: Sitting, Cuff Size: Normal)   Pulse 68   Temp 97.9 F (36.6 C) (Oral)   Resp 16   Wt 139 lb (63 kg)   BMI 25.42 kg/m   Physical Exam  Constitutional: She is oriented to person, place, and time.  She appears well-developed and well-nourished. No distress.  Cardiovascular: Normal rate and regular rhythm.   Pulmonary/Chest: Effort normal and breath sounds normal.  Abdominal: Soft. Bowel sounds are normal. She exhibits no distension. There is no tenderness. There is no rebound, no guarding and no CVA tenderness.  Neurological: She is alert and oriented to person, place, and time.  Skin: Skin is warm  and dry. She is not diaphoretic.  Psychiatric: She has a normal mood and affect. Her behavior is normal.        Assessment & Plan:     1. Cystitis  On appropriate treatment now. Have gone over benign causes of UTIs including recent sexual activity and fecal contamination. Patient does have history of bladder prolpase with sling treated at [facility], however her provider has recently left. Evaluate and treat as below.  - Ambulatory referral to Urology - CBC with Differential/Platelet - Urine Culture - POCT urinalysis dipstick  2. Other fatigue  Labwork was normal. Proceed with urology appt.  - Comprehensive metabolic panel - CBC with Differential/Platelet  Return if symptoms worsen or fail to improve.  Patient Instructions  Urinary Tract Infection, Adult A urinary tract infection (UTI) is an infection of any part of the urinary tract, which includes the kidneys, ureters, bladder, and urethra. These organs make, store, and get rid of urine in the body. UTI can be a bladder infection (cystitis) or kidney infection (pyelonephritis). What are the causes? This infection may be caused by fungi, viruses, or bacteria. Bacteria are the most common cause of UTIs. This condition can also be caused by repeated incomplete emptying of the bladder during urination. What increases the risk? This condition is more likely to develop if:  You ignore your need to urinate or hold urine for long periods of time.  You do not empty your bladder completely during urination.  You wipe back to front after urinating or having a bowel movement, if you are female.  You are uncircumcised, if you are female.  You are constipated.  You have a urinary catheter that stays in place (indwelling).  You have a weak defense (immune) system.  You have a medical condition that affects your bowels, kidneys, or bladder.  You have diabetes.  You take antibiotic medicines frequently or for long periods of  time, and the antibiotics no longer work well against certain types of infections (antibiotic resistance).  You take medicines that irritate your urinary tract.  You are exposed to chemicals that irritate your urinary tract.  You are female. What are the signs or symptoms? Symptoms of this condition include:  Fever.  Frequent urination or passing small amounts of urine frequently.  Needing to urinate urgently.  Pain or burning with urination.  Urine that smells bad or unusual.  Cloudy urine.  Pain in the lower abdomen or back.  Trouble urinating.  Blood in the urine.  Vomiting or being less hungry than normal.  Diarrhea or abdominal pain.  Vaginal discharge, if you are female. How is this diagnosed? This condition is diagnosed with a medical history and physical exam. You will also need to provide a urine sample to test your urine. Other tests may be done, including:  Blood tests.  Sexually transmitted disease (STD) testing. If you have had more than one UTI, a cystoscopy or imaging studies may be done to determine the cause of the infections. How is this treated? Treatment for this condition often includes a combination of two or more of the following:  Antibiotic medicine.  Other medicines to treat less common causes of UTI.  Over-the-counter medicines to treat pain.  Drinking enough water to stay hydrated. Follow these instructions at home:  Take over-the-counter and prescription medicines only as told by your health care provider.  If you were prescribed an antibiotic, take it as told by your health care provider. Do not stop taking the antibiotic even if you start to feel better.  Avoid alcohol, caffeine, tea, and carbonated beverages. They can irritate your bladder.  Drink enough fluid to keep your urine clear or pale yellow.  Keep all follow-up visits as told by your health care provider. This is important.  Make sure to:  Empty your bladder  often and completely. Do not hold urine for long periods of time.  Empty your bladder before and after sex.  Wipe from front to back after a bowel movement if you are female. Use each tissue one time when you wipe. Contact a health care provider if:  You have back pain.  You have a fever.  You feel nauseous or vomit.  Your symptoms do not get better after 3 days.  Your symptoms go away and then return. Get help right away if:  You have severe back pain or lower abdominal pain.  You are vomiting and cannot keep down any medicines or water. This information is not intended to replace advice given to you by your health care provider. Make sure you discuss any questions you have with your health care provider. Document Released: 07/24/2005 Document Revised: 03/27/2016 Document Reviewed: 09/04/2015 Elsevier Interactive Patient Education  2017 [employer].    The entirety of the information documented in the History of Present Illness, Review of Systems and Physical Exam were personally obtained by me. Portions of this information were initially documented by Ashley Royalty, CMA and reviewed by me for thoroughness and accuracy.          Trinna Post, PA-C  [facility]

## 2016-11-26 LAB — CBC WITH DIFFERENTIAL/PLATELET
Basophils Absolute: 0 10*3/uL (ref 0.0–0.2)
Basos: 0 %
EOS (ABSOLUTE): 0.1 10*3/uL (ref 0.0–0.4)
Eos: 1 %
Hematocrit: 40.9 % (ref 34.0–46.6)
Hemoglobin: 13.2 g/dL (ref 11.1–15.9)
Immature Grans (Abs): 0 10*3/uL (ref 0.0–0.1)
Immature Granulocytes: 0 %
Lymphocytes Absolute: 2.4 10*3/uL (ref 0.7–3.1)
Lymphs: 28 %
MCH: 29.5 pg (ref 26.6–33.0)
MCHC: 32.3 g/dL (ref 31.5–35.7)
MCV: 92 fL (ref 79–97)
Monocytes Absolute: 0.6 10*3/uL (ref 0.1–0.9)
Monocytes: 8 %
Neutrophils Absolute: 5.4 10*3/uL (ref 1.4–7.0)
Neutrophils: 63 %
Platelets: 310 10*3/uL (ref 150–379)
RBC: 4.47 x10E6/uL (ref 3.77–5.28)
RDW: 13 % (ref 12.3–15.4)
WBC: 8.5 10*3/uL (ref 3.4–10.8)

## 2016-11-26 LAB — COMPREHENSIVE METABOLIC PANEL
ALT: 17 IU/L (ref 0–32)
AST: 18 IU/L (ref 0–40)
Albumin/Globulin Ratio: 1.8 (ref 1.2–2.2)
Albumin: 4.6 g/dL (ref 3.5–5.5)
Alkaline Phosphatase: 53 IU/L (ref 39–117)
BUN/Creatinine Ratio: 17 (ref 9–23)
BUN: 12 mg/dL (ref 6–24)
Bilirubin Total: 0.2 mg/dL (ref 0.0–1.2)
CO2: 22 mmol/L (ref 18–29)
Calcium: 9.4 mg/dL (ref 8.7–10.2)
Chloride: 98 mmol/L (ref 96–106)
Creatinine, Ser: 0.69 mg/dL (ref 0.57–1.00)
GFR calc Af Amer: 116 mL/min/{1.73_m2} (ref >59)
GFR calc non Af Amer: 100 mL/min/{1.73_m2} (ref >59)
Globulin, Total: 2.5 g/dL (ref 1.5–4.5)
Glucose: 81 mg/dL (ref 65–99)
Potassium: 3.9 mmol/L (ref 3.5–5.2)
Sodium: 137 mmol/L (ref 134–144)
Total Protein: 7.1 g/dL (ref 6.0–8.5)

## 2016-11-26 NOTE — Patient Instructions (Signed)

## 2016-11-26 NOTE — Progress Notes (Signed)
Advised  ED 

## 2016-11-27 ENCOUNTER — Encounter: Payer: Self-pay | Admitting: Urology

## 2016-11-27 ENCOUNTER — Ambulatory Visit (INDEPENDENT_AMBULATORY_CARE_PROVIDER_SITE_OTHER): Payer: 59 | Admitting: Urology

## 2016-11-27 VITALS — BP 91/61 | HR 81 | Ht 62.0 in | Wt 136.8 lb

## 2016-11-27 DIAGNOSIS — N814 Uterovaginal prolapse, unspecified: Secondary | ICD-10-CM | POA: Diagnosis not present

## 2016-11-27 DIAGNOSIS — N3000 Acute cystitis without hematuria: Secondary | ICD-10-CM | POA: Diagnosis not present

## 2016-11-27 DIAGNOSIS — N200 Calculus of kidney: Secondary | ICD-10-CM

## 2016-11-27 LAB — URINE CULTURE: Organism ID, Bacteria: NO GROWTH

## 2016-11-27 LAB — PLEASE NOTE

## 2016-11-27 NOTE — Progress Notes (Signed)
11/27/2016 3:11 PM   Theresa Walton [birth year] [id number]  Referring provider: Birdie Sons, MD [address] [place], [place] [address]  Chief Complaint  Patient presents with  . New Patient (Initial Visit)    HPI: 51 yo F Presents today for further evaluation for possible recurrent/incompletely treated urinary tract infection.  She reports that around Christmas time 2017, she developed malodorous urine along with nausea and fatigue. She called the service called M.D. live which treated a  "UTI" in Dec 2017 with macrobid x 3 days, later changed to Z-pack for sinus issues then completed course with 4 because she felt that her urinary symptoms did not completely resolve. She does report that she took a home UTI kit which was positive for leukocyte esterase but no nitrites.  She continued to have the same issues which persisted. On 11/11/2016, [facility] at which time a urine culture was obtained. This ultimately grew both Klebsiella as well as Escherichia coli with variable resistance pattern (CLL essentially pansensitive, Escherichia coli resistant to ampicillin,  amoxicillin, Keflex, Cipro, Levaquin, piperacillin and, tetracycline, and Bactrim). but the Escherichia coli colonies were resistant to Cipro.    Finally, on 11/19/2016, she was seen at [facility] family practice.  This time, she grew Escherichia coli again which was resistant to multiple antibiotics including ampicillin, Augmentin, Cipro, Bactrim, but sensitive to nitrofurantoin. She is allergic to cephalosporins.  She was started on a 10 day course of Macrobid. She did have a follow-up urine culture 2 days ago which has no growth to date.   Today, she does think that her urine has cleared up and she is no longer nauseous. She does continue to have severe fatigue and poor appetite which is worrisome to her.  She does have baseline urgency, frequency althouth these were worse over the past  month.  She does try to stay well hydrated thoughout the day.  She is been drinking copious amounts of cranberry juice.  She does have a personal history of pelvic organ prolapse. She underwent initially rectopexy followed by an anterior repair with a mid urethral sling and most recently a partial hysterectomy.  She is newly sexually active. She denies any anal intercourse. She does report vaginal dryness.  She is perimenopausal.  Prior to this, she has no previous history of urinary tract infections.  She did pass a kidney stone in her 53s but no flank pain or stone since that time.  Most recent cross-sectional imaging on 05/2016 show punctate lower pole stones, otherwise no significant stone burden or GU abnormalities on CT abd/ pelvis with contrast.     PMH: Past Medical History:  Diagnosis Date  . 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 Genital herpes   . Seasonal allergies   . Syncope and collapse   . Thyroid disease    hypothyroidism    Surgical History: Past Surgical History:  Procedure Laterality Date  . ABDOMINAL HYSTERECTOMY    . ANTERIOR CRUCIATE LIGAMENT REPAIR Right 2012  . ARTHROSCOPIC REPAIR ACL  2013   Right knee  . AUGMENTATION MAMMAPLASTY Bilateral 2000   saline  . BLADDER REPAIR    . BREAST ENHANCEMENT SURGERY  1995  . CYSTOCELE REPAIR  2013  . ENDOMETRIAL ABLATION  2013  . [place], 2001  . PARTIAL HYSTERECTOMY  2015  . PARTIAL HYSTERECTOMY Bilateral 2014  . RECTOPEXY  2011  . TONSILECTOMY, ADENOIDECTOMY, BILATERAL MYRINGOTOMY AND TUBES  1984  Home Medications:  Allergies as of 11/27/2016      Reactions   Cefaclor Anaphylaxis   Cephalosporins Anaphylaxis   Lubiprostone    Other reaction(s): Angioedema Tongue Swelling x 2 times.    Pseudoephedrine    Tachycardia      Medication List       Accurate as of 11/27/16  3:11 PM. Always use your most recent med list.          FLUoxetine 20 MG capsule Commonly known  as:  PROZAC Take 50 mg by mouth at bedtime.   glucosamine-chondroitin 500-400 MG tablet Take 1 tablet by mouth 3 (three) times daily.   levothyroxine 25 MCG tablet Commonly known as:  SYNTHROID, LEVOTHROID Take 1 tablet (25 mcg total) by mouth daily before breakfast.   Melatonin 5 MG Tabs Take by mouth.   nitrofurantoin (macrocrystal-monohydrate) 100 MG capsule Commonly known as:  MACROBID Take 1 capsule (100 mg total) by mouth 2 (two) times daily.   promethazine 12.5 MG tablet Commonly known as:  PHENERGAN Take 1 tablet (12.5 mg total) by mouth every 6 (six) hours as needed for nausea or vomiting.   valACYclovir 500 MG tablet Commonly known as:  VALTREX Take 500 mg by mouth 2 (two) times daily.       Allergies:  Allergies  Allergen Reactions  . Cefaclor Anaphylaxis  . Cephalosporins Anaphylaxis  . Lubiprostone     Other reaction(s): Angioedema Tongue Swelling x 2 times.   . Pseudoephedrine     Tachycardia    Family History: Family History  Problem Relation Age of Onset  . Pulmonary fibrosis Mother   . Healthy Sister   . Healthy Brother   . Healthy Sister   . Lung cancer Paternal Grandmother   . Arrhythmia Father   . Heart disease Father   . Prostate cancer Neg Hx   . Bladder Cancer Neg Hx   . Kidney cancer Neg Hx     Social History:  reports that she quit smoking about 18 months ago. Her smoking use included Cigarettes. She has never used smokeless tobacco. She reports that she does not drink alcohol or use drugs.  ROS: UROLOGY Frequent Urination?: Yes Hard to postpone urination?: No Burning/pain with urination?: No Get up at night to urinate?: No Leakage of urine?: No Urine stream starts and stops?: Yes Trouble starting stream?: No Do you have to strain to urinate?: Yes Blood in urine?: No Urinary tract infection?: No Sexually transmitted disease?: No Injury to kidneys or bladder?: No Painful intercourse?: No Weak stream?: No Currently  pregnant?: No Vaginal bleeding?: No Last menstrual period?: n  Gastrointestinal Nausea?: Yes Vomiting?: No Indigestion/heartburn?: Yes Diarrhea?: No Constipation?: No  Constitutional Fever: No Night sweats?: Yes Weight loss?: No Fatigue?: Yes  Skin Skin rash/lesions?: Yes Itching?: No  Eyes Blurred vision?: No Double vision?: No  Ears/Nose/Throat Sore throat?: No Sinus problems?: Yes  Hematologic/Lymphatic Swollen glands?: No Easy bruising?: Yes  Cardiovascular Leg swelling?: No Chest pain?: No  Respiratory Cough?: No Shortness of breath?: No  Endocrine Excessive thirst?: No  Musculoskeletal Back pain?: No Joint pain?: Yes  Neurological Headaches?: Yes Dizziness?: Yes  Psychologic Depression?: No Anxiety?: No  Physical Exam: BP 91/61 (BP Location: Left Arm, Patient Position: Sitting, Cuff Size: Normal)   Pulse 81   Ht _0  (1.575 m)   Wt 136 lb 12.8 oz (62.1 kg)   BMI 25.02 kg/m   Constitutional:  Alert and oriented, No acute distress. HEENT: [place] AT, moist mucus  membranes.  Trachea midline, no masses. Cardiovascular: No clubbing, cyanosis, or edema. Respiratory: Normal respiratory effort, no increased work of breathing. GI: Abdomen is soft, nontender, nondistended, no abdominal masses GU: No CVA tenderness.  Skin: No rashes, bruises or suspicious lesions. Neurologic: Grossly intact, no focal deficits, moving all 4 extremities. Psychiatric: Normal mood and affect.  Laboratory Data: Lab Results  Component Value Date   WBC 8.5 11/25/2016   HGB 14.3 04/11/2014   HCT 40.9 11/25/2016   MCV 92 11/25/2016   PLT 310 11/25/2016    Lab Results  Component Value Date   CREATININE 0.69 11/25/2016    Lab Results  Component Value Date   TESTOSTERONE <3 (L) 04/19/2015     Urinalysis Results for orders placed or performed in visit on 11/25/16  Urine Culture  Result Value Ref Range   Urine Culture, Routine Final report    Urine Culture  result 1 No growth   Comprehensive metabolic panel  Result Value Ref Range   Glucose 81 65 - 99 mg/dL   BUN 12 6 - 24 mg/dL   Creatinine, Ser 0.69 0.57 - 1.00 mg/dL   GFR calc non Af Amer 100 >59 mL/min/1.73   GFR calc Af Amer 116 >59 mL/min/1.73   BUN/Creatinine Ratio 17 9 - 23   Sodium 137 134 - 144 mmol/L   Potassium 3.9 3.5 - 5.2 mmol/L   Chloride 98 96 - 106 mmol/L   CO2 22 18 - 29 mmol/L   Calcium 9.4 8.7 - 10.2 mg/dL   Total Protein 7.1 6.0 - 8.5 g/dL   Albumin 4.6 3.5 - 5.5 g/dL   Globulin, Total 2.5 1.5 - 4.5 g/dL   Albumin/Globulin Ratio 1.8 1.2 - 2.2   Bilirubin Total 0.2 0.0 - 1.2 mg/dL   Alkaline Phosphatase 53 39 - 117 IU/L   AST 18 0 - 40 IU/L   ALT 17 0 - 32 IU/L  CBC with Differential/Platelet  Result Value Ref Range   WBC 8.5 3.4 - 10.8 x10E3/uL   RBC 4.47 3.77 - 5.28 x10E6/uL   Hemoglobin 13.2 11.1 - 15.9 g/dL   Hematocrit 40.9 34.0 - 46.6 %   MCV 92 79 - 97 fL   MCH 29.5 26.6 - 33.0 pg   MCHC 32.3 31.5 - 35.7 g/dL   RDW 13.0 12.3 - 15.4 %   Platelets 310 150 - 379 x10E3/uL   Neutrophils 63 Not Estab. %   Lymphs 28 Not Estab. %   Monocytes 8 Not Estab. %   Eos 1 Not Estab. %   Basos 0 Not Estab. %   Neutrophils Absolute 5.4 1.4 - 7.0 x10E3/uL   Lymphocytes Absolute 2.4 0.7 - 3.1 x10E3/uL   Monocytes Absolute 0.6 0.1 - 0.9 x10E3/uL   EOS (ABSOLUTE) 0.1 0.0 - 0.4 x10E3/uL   Basophils Absolute 0.0 0.0 - 0.2 x10E3/uL   Immature Granulocytes 0 Not Estab. %   Immature Grans (Abs) 0.0 0.0 - 0.1 x10E3/uL  Please Note  Result Value Ref Range   Please note Comment   POCT urinalysis dipstick  Result Value Ref Range   Color, UA Yellow    Clarity, UA Clear    Glucose, UA Negative    Bilirubin, UA Negative    Ketones, UA Negative    Spec Grav, UA <=1.005    Blood, UA Negative    pH, UA 8.0    Protein, UA Negative    Urobilinogen, UA 0.2    Nitrite, UA Negative  Leukocytes, UA Negative Negative    Pertinent Imaging: CT from 05/2016 reviewed  personally today   Assessment & Plan:    1. Acute cystitis without hematuria  Reviewing the patient's culture data and history today, I suspect that she was inadequately, incompletely treated from her initial urinary tract infection approximate one month ago.  Most recent follow-up urine culture is reassuring.  Her symptoms are improving.    We had a lengthy discussion explaining that since this is essentially her first urinary tract infection, there is no indication at this time for further workup including vaginal exam, or cystoscopy. Additionally, I would not start her on any suppressive antibiotics given her multiple antibiotic resistance and allergies as well as in the nature of antibiotics stewardship.  I explained that if she continues to have recurrent and frequent infections, we will pursue further workup including the aforementioned. Most importantly, we waited perform cystoscopy to rule out any mesh extrusion or erosion.  We had a discussion today regarding good hygiene techniques as well as use of cranberry tabs twice daily and probiotics.  She was advised if she has any symptoms consistent with infection, she should call our office for a same day nurse visit. If we continue to see a pattern of recurrent infections, greater than 3 infections in 1 year, we'll pursue further intervention and workup. At that point, which strongly consider use of vaginal mesh itching cream.  2. Bilateral nephrolithiasis  punctate upper tract stones would not recommend any intervention Unlikely source of infection   3. History of pelvic organ prolapse S/p multiple surgeries, currently asymptomatic  Follow up as needed  Hollice Espy, MD  [place] [address], [place],  [address] [phone]  I spent 45 min with this patient of which greater than 50% was spent in counseling and coordination of care with the patient.

## 2016-11-28 ENCOUNTER — Encounter: Payer: Self-pay | Admitting: Physician Assistant

## 2016-11-29 ENCOUNTER — Encounter: Payer: Self-pay | Admitting: Family Medicine

## 2016-11-29 ENCOUNTER — Other Ambulatory Visit: Payer: Self-pay | Admitting: Physician Assistant

## 2016-12-05 ENCOUNTER — Other Ambulatory Visit: Payer: Self-pay | Admitting: Physician Assistant

## 2016-12-05 DIAGNOSIS — Z1231 Encounter for screening mammogram for malignant neoplasm of breast: Secondary | ICD-10-CM

## 2016-12-09 ENCOUNTER — Encounter: Payer: Self-pay | Admitting: Urology

## 2016-12-10 ENCOUNTER — Ambulatory Visit: Payer: 59 | Admitting: Urology

## 2016-12-10 ENCOUNTER — Ambulatory Visit (INDEPENDENT_AMBULATORY_CARE_PROVIDER_SITE_OTHER): Payer: 59

## 2016-12-10 VITALS — BP 112/77 | HR 74 | Ht 62.0 in | Wt 137.1 lb

## 2016-12-10 DIAGNOSIS — N390 Urinary tract infection, site not specified: Secondary | ICD-10-CM | POA: Diagnosis not present

## 2016-12-10 LAB — MICROSCOPIC EXAMINATION: Bacteria, UA: NONE SEEN

## 2016-12-10 LAB — URINALYSIS, COMPLETE
Bilirubin, UA: NEGATIVE
Glucose, UA: NEGATIVE
Ketones, UA: NEGATIVE
Leukocytes, UA: NEGATIVE
Nitrite, UA: NEGATIVE
Protein, UA: NEGATIVE
RBC, UA: NEGATIVE
Specific Gravity, UA: 1.01 (ref 1.005–1.030)
Urobilinogen, Ur: 0.2 mg/dL (ref 0.2–1.0)
pH, UA: 7 (ref 5.0–7.5)

## 2016-12-10 NOTE — Progress Notes (Signed)
Pt presented today with c/o urinary frequency and urgency, hard to postpone urination, back pain, and nausea. A clean catch was obtained for u/a and cx.  Blood pressure 112/77, pulse 74, height 5\' 2"  (1.575 m), weight 137 lb 1.6 oz (62.2 kg).

## 2016-12-10 NOTE — Addendum Note (Signed)
Addended by: Lestine Box on: 12/10/2016 03:14 PM   Modules accepted: Orders

## 2016-12-11 ENCOUNTER — Telehealth: Payer: Self-pay

## 2016-12-11 NOTE — Telephone Encounter (Signed)
-----   Message from Hollice Espy, MD sent at 12/10/2016  2:56 PM EST ----- UA today looks absolutely perfect.  No signs of infection whatsoever.    Hollice Espy, MD

## 2016-12-11 NOTE — Telephone Encounter (Signed)
Spoke with pt in reference to clear u/a. Pt voiced understanding.

## 2016-12-18 ENCOUNTER — Encounter: Payer: Self-pay | Admitting: Family Medicine

## 2016-12-19 ENCOUNTER — Ambulatory Visit (INDEPENDENT_AMBULATORY_CARE_PROVIDER_SITE_OTHER): Payer: 59 | Admitting: Physician Assistant

## 2016-12-19 ENCOUNTER — Encounter: Payer: Self-pay | Admitting: Physician Assistant

## 2016-12-19 ENCOUNTER — Other Ambulatory Visit: Payer: Self-pay | Admitting: Physician Assistant

## 2016-12-19 VITALS — BP 98/60 | HR 110 | Temp 98.2°F | Resp 16 | Wt 137.0 lb

## 2016-12-19 DIAGNOSIS — Z131 Encounter for screening for diabetes mellitus: Secondary | ICD-10-CM | POA: Diagnosis not present

## 2016-12-19 DIAGNOSIS — J320 Chronic maxillary sinusitis: Secondary | ICD-10-CM

## 2016-12-19 DIAGNOSIS — Z1231 Encounter for screening mammogram for malignant neoplasm of breast: Secondary | ICD-10-CM

## 2016-12-19 DIAGNOSIS — J040 Acute laryngitis: Secondary | ICD-10-CM | POA: Diagnosis not present

## 2016-12-19 DIAGNOSIS — Z0000 Encounter for general adult medical examination without abnormal findings: Secondary | ICD-10-CM | POA: Diagnosis not present

## 2016-12-19 DIAGNOSIS — Z1239 Encounter for other screening for malignant neoplasm of breast: Secondary | ICD-10-CM

## 2016-12-19 DIAGNOSIS — E781 Pure hyperglyceridemia: Secondary | ICD-10-CM | POA: Diagnosis not present

## 2016-12-19 NOTE — Progress Notes (Signed)
Patient: Theresa Walton, Female    DOB: Mar 08, 1966, 51 y.o.   MRN: [id number] Visit Date: 12/19/2016  Today's Provider: Mar Daring, PA-C   No chief complaint on file.  Subjective:    Annual physical exam Theresa Walton is a 51 y.o. female who presents today for health maintenance and complete physical. She feels well. She reports exercising none. She reports she is sleeping poorly.  Labs done: 11/25/16-Normal CPE:01/22/16 Mammogram:12/04/15 BI-RADS 1 Pap: Partial Hysterectomy Mammogram is scheduled for 01/03/17 ----------------------------------------------------------------- She is having sinus pressure, feels congested. She tried: Tylenol  3 days and antihistamine. Mucinex 12 hrs, flushing with netti pot 2x day and added white vinegar and herbal nasal wash with camphor eucalyptus to thin mucus. She is scheduled to have a dental procedure but is not going to be done until she is better.  Review of Systems  Constitutional: Negative.   HENT: Positive for congestion, postnasal drip, rhinorrhea, sinus pressure and voice change (laryngitis). Negative for sinus pain, sore throat, tinnitus and trouble swallowing.   Eyes: Negative.   Respiratory: Negative.   Cardiovascular: Negative.   Gastrointestinal: Negative.   Endocrine: Negative.   Genitourinary: Negative.   Musculoskeletal: Negative.   Skin: Negative.   Allergic/Immunologic: Negative.   Neurological: Negative.   Hematological: Negative.   Psychiatric/Behavioral: Negative.     Social History      She  reports that she quit smoking about 19 months ago. Her smoking use included Cigarettes. She has never used smokeless tobacco. She reports that she does not drink alcohol or use drugs.       Social History   Social History  . Marital status: Divorced    Spouse name: N/A  . Number of children: N/A  . Years of education: N/A   Social History Main Topics  . Smoking status: Former Smoker    Types:  Cigarettes    Quit date: 05/15/2015  . Smokeless tobacco: Never Used  . Alcohol use No  . Drug use: No  . Sexual activity: No   Other Topics Concern  . None   Social History Narrative  . None    Past Medical History:  Diagnosis Date  . 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 Genital herpes   . Seasonal allergies   . Syncope and collapse   . Thyroid disease    hypothyroidism     Patient Active Problem List   Diagnosis Date Noted  . Essential tremor 05/15/2016  . Allergic rhinitis 06/22/2015  . B12 deficiency 06/21/2015  . H/O: hysterectomy 06/21/2015  . Hypertriglyceridemia 06/21/2015  . Hypoglycemia 06/21/2015  . Arterial blood pressure decreased 06/21/2015  . Menopausal and perimenopausal disorder 06/21/2015  . Vasovagal symptom 06/21/2015  . Vision disturbance 06/21/2015  . Insomnia 04/19/2015  . Night sweats 04/19/2015  . 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 Chronic constipation 08/31/2014  . Adult hypothyroidism 08/28/2009  . Avitaminosis D 08/28/2009  . Arthropathia 02/10/2009  . Chronic infection of sinus 02/01/2009  . Adaptation reaction 09/09/2008  . Genital herpes simplex type 1 infection 07/13/2008  . Current tobacco use 07/13/2008    Past Surgical History:  Procedure Laterality Date  . ABDOMINAL HYSTERECTOMY    . ANTERIOR CRUCIATE LIGAMENT REPAIR Right 2012  . ARTHROSCOPIC REPAIR ACL  2013   Right knee  . AUGMENTATION MAMMAPLASTY Bilateral 2000   saline  . BLADDER REPAIR    . BREAST ENHANCEMENT SURGERY  1995  . CYSTOCELE REPAIR  2013  . ENDOMETRIAL ABLATION  2013  . [place], 2001  . PARTIAL HYSTERECTOMY  2015  . PARTIAL HYSTERECTOMY Bilateral 2014  . RECTOPEXY  2011  . TONSILECTOMY, ADENOIDECTOMY, BILATERAL MYRINGOTOMY AND TUBES  1984    Family History        Family Status  Relation Status  . Mother Deceased at age 11-09-07  . Sister Alive  . Brother Alive  . Sister Alive  . Paternal Grandmother   . Father  Alive  . Neg Hx         Her family history includes Arrhythmia in her father; Healthy in her brother, sister, and sister; Heart disease in her father; Lung cancer in her paternal grandmother; Pulmonary fibrosis in her mother.     Allergies  Allergen Reactions  . Cefaclor Anaphylaxis  . Cephalosporins Anaphylaxis  . Lubiprostone     Other reaction(s): Angioedema Tongue Swelling x 2 times.   . Pseudoephedrine     Tachycardia     Current Outpatient Prescriptions:  .  glucosamine-chondroitin 500-400 MG tablet, Take 1 tablet by mouth 3 (three) times daily., Disp: , Rfl:  .  levothyroxine (SYNTHROID, LEVOTHROID) 25 MCG tablet, Take 1 tablet (25 mcg total) by mouth daily before breakfast., Disp: 90 tablet, Rfl: 1 .  Melatonin 5 MG TABS, Take by mouth., Disp: , Rfl: 0 .  valACYclovir (VALTREX) 500 MG tablet, Take 500 mg by mouth 2 (two) times daily., Disp: , Rfl:  .  FLUoxetine (PROZAC) 20 MG capsule, Take 50 mg by mouth at bedtime. , Disp: , Rfl: 0 .  promethazine (PHENERGAN) 12.5 MG tablet, Take 1 tablet (12.5 mg total) by mouth every 6 (six) hours as needed for nausea or vomiting. (Patient not taking: Reported on 11/27/2016), Disp: 30 tablet, Rfl: 0   Patient Care Team: Birdie Sons, MD as PCP - General (Family Medicine)      Objective:   Vitals: BP 98/60 (BP Location: Left Arm, Patient Position: Sitting, Cuff Size: Normal)   Pulse (!) 110   Temp 98.2 F (36.8 C) (Oral)   Resp 16   Wt 137 lb (62.1 kg)   SpO2 98%   BMI 25.06 kg/m    Physical Exam  Constitutional: She is oriented to person, place, and time. She appears well-developed and well-nourished. No distress.  HENT:  Head: Normocephalic and atraumatic.  Right Ear: Hearing, tympanic membrane, external ear and ear canal normal.  Left Ear: Hearing, tympanic membrane, external ear and ear canal normal.  Nose: No mucosal edema or rhinorrhea. Right sinus exhibits maxillary sinus tenderness. Left sinus exhibits maxillary  sinus tenderness.  Mouth/Throat: Uvula is midline, oropharynx is clear and moist and mucous membranes are normal. No oropharyngeal exudate, posterior oropharyngeal edema or posterior oropharyngeal erythema.  Eyes: Conjunctivae and EOM are normal. Pupils are equal, round, and reactive to light. Right eye exhibits no discharge. Left eye exhibits no discharge. No scleral icterus.  Neck: Normal range of motion. Neck supple. No JVD present. Carotid bruit is not present. No tracheal deviation present. No thyromegaly present.  Cardiovascular: Normal rate, regular rhythm, normal heart sounds and intact distal pulses.  Exam reveals no gallop and no friction rub.   No murmur heard. Pulmonary/Chest: Effort normal and breath sounds normal. No respiratory distress. She has no wheezes. She has no rales. She exhibits no tenderness. Right breast exhibits no inverted nipple, no mass, no nipple discharge, no skin change and no tenderness. Left breast exhibits no inverted nipple, no mass, no nipple discharge,  no skin change and no tenderness. Breasts are symmetrical.  Abdominal: Soft. Bowel sounds are normal. She exhibits no distension and no mass. There is no tenderness. There is no rebound and no guarding. Hernia confirmed negative in the right inguinal area and confirmed negative in the left inguinal area.  Genitourinary: Rectum normal and vagina normal. No breast swelling, tenderness, discharge or bleeding. Pelvic exam was performed with patient supine. There is no rash, tenderness, lesion or injury on the right labia. There is no rash, tenderness, lesion or injury on the left labia. Right adnexum displays no mass, no tenderness and no fullness. Left adnexum displays no mass, no tenderness and no fullness. No erythema, tenderness or bleeding in the vagina. No signs of injury around the vagina. No vaginal discharge found.  Genitourinary Comments: Uterus surgically absent  Musculoskeletal: Normal range of motion. She  exhibits no edema or tenderness.  Lymphadenopathy:    She has no cervical adenopathy.       Right: No inguinal adenopathy present.       Left: No inguinal adenopathy present.  Neurological: She is alert and oriented to person, place, and time. She has normal reflexes. No cranial nerve deficit. Coordination normal.  Skin: Skin is warm and dry. No rash noted. She is not diaphoretic.  Psychiatric: She has a normal mood and affect. Her behavior is normal. Judgment and thought content normal.  Vitals reviewed.    Depression Screen PHQ 2/9 Scores 01/22/2016  PHQ - 2 Score 0      Assessment & Plan:     Routine Health Maintenance and Physical Exam  Exercise Activities and Dietary recommendations Goals    None      Immunization History  Administered Date(s) Administered  . Influenza,inj,Quad PF,36+ Mos 08/12/2016  . Influenza-Unspecified 08/29/2015  . Tdap 08/12/2016    Health Maintenance  Topic Date Due  . PAP SMEAR  09/15/1985  . MAMMOGRAM  12/03/2017  . COLONOSCOPY  05/11/2024  . TETANUS/TDAP  08/12/2026  . INFLUENZA VACCINE  Completed  . Hepatitis C Screening  Completed  . HIV Screening  Completed     Discussed health benefits of physical activity, and encouraged her to engage in regular exercise appropriate for her age and condition.   1. Annual physical exam Normal physical exam today. Will check labs as below and f/u pending lab results. If labs are stable and WNL she will not need to have these rechecked for one year at her next annual physical exam. She is to call the office in the meantime if she has any acute issue, questions or concerns.  2. Breast cancer screening Patient has mammogram scheduled for 01/03/17.  3. Hypertriglyceridemia Will check labs as below and f/u pending results. - Lipid panel  4. Diabetes mellitus screening Will check labs as below and f/u pending results. - Hemoglobin A1c  5. Chronic maxillary sinusitis Patient brought a sputum  culture from home to be sent off. She reports that just recently she had a UTI that took 3-4 rounds of antibiotics to clear because the initial antibiotic the infection had developed a resistance. She states she is not starting an antibiotic until sputum culture results. She will continue her OTC regimen as stated above.  - Respiratory or Resp and Sputum Culture  6. Laryngitis Continue conservative management. Discussed salt water gargles and voice rest.  --------------------------------------------------------------------    Mar Daring, PA-C  [facility]

## 2016-12-19 NOTE — Patient Instructions (Signed)

## 2016-12-20 ENCOUNTER — Encounter: Payer: Self-pay | Admitting: Physician Assistant

## 2016-12-20 LAB — LIPID PANEL
Chol/HDL Ratio: 3.6 (ref 0.0–4.4)
Cholesterol, Total: 183 mg/dL (ref 100–199)
HDL: 51 mg/dL (ref >39)
LDL Calculated: 102 — ABNORMAL HIGH (ref 0–99)
Triglycerides: 152 mg/dL — ABNORMAL HIGH (ref 0–149)
VLDL Cholesterol Cal: 30 (ref 5–40)

## 2016-12-20 LAB — HEMOGLOBIN A1C
Est. average glucose Bld gHb Est-mCnc: 100
Hgb A1c MFr Bld: 5.1 % (ref 4.8–5.6)

## 2016-12-21 ENCOUNTER — Encounter: Payer: Self-pay | Admitting: Physician Assistant

## 2016-12-21 DIAGNOSIS — J0100 Acute maxillary sinusitis, unspecified: Secondary | ICD-10-CM

## 2016-12-23 ENCOUNTER — Telehealth: Payer: Self-pay | Admitting: Emergency Medicine

## 2016-12-23 ENCOUNTER — Telehealth: Payer: Self-pay

## 2016-12-23 MED ORDER — PREDNISONE 10 MG (21) PO TBPK: ORAL_TABLET | ORAL | 0 refills | Status: DC

## 2016-12-23 NOTE — Telephone Encounter (Signed)
pt called back about her sputum culture. Wanting to see if results were back yet. Told her they were not yet.

## 2016-12-23 NOTE — Telephone Encounter (Signed)
We will call once available. Continue clindamycin until otherwise advised.

## 2016-12-23 NOTE — Telephone Encounter (Signed)
I have been in contact with patient via [employer]. Please see those notes for further reference to this issue.

## 2016-12-23 NOTE — Telephone Encounter (Signed)
Pt calling to check on a culture that was done on Thursday.  She also reports that she is not feeling any better.  She states every time she lies done she coughs.    Thanks,   -Mickel Baas

## 2016-12-24 ENCOUNTER — Other Ambulatory Visit: Payer: Self-pay | Admitting: Physician Assistant

## 2016-12-24 ENCOUNTER — Encounter: Payer: Self-pay | Admitting: Physician Assistant

## 2016-12-24 ENCOUNTER — Ambulatory Visit (INDEPENDENT_AMBULATORY_CARE_PROVIDER_SITE_OTHER): Payer: 59 | Admitting: Physician Assistant

## 2016-12-24 ENCOUNTER — Telehealth: Payer: Self-pay | Admitting: Family Medicine

## 2016-12-24 VITALS — BP 118/68 | HR 84 | Temp 98.2°F | Resp 16 | Wt 136.0 lb

## 2016-12-24 DIAGNOSIS — J0110 Acute frontal sinusitis, unspecified: Secondary | ICD-10-CM

## 2016-12-24 MED ORDER — CLARITHROMYCIN 500 MG PO TABS: 500.0000 mg | ORAL_TABLET | Freq: Two times a day (BID) | ORAL | 0 refills | Status: AC

## 2016-12-24 MED ORDER — DOXYCYCLINE HYCLATE 100 MG PO TABS: 100.0000 mg | ORAL_TABLET | Freq: Two times a day (BID) | ORAL | 0 refills | Status: DC

## 2016-12-24 MED ORDER — HYDROCODONE-HOMATROPINE 5-1.5 MG/5ML PO SYRP: 5.0000 mL | ORAL_SOLUTION | Freq: Every evening | ORAL | 0 refills | Status: DC | PRN

## 2016-12-24 NOTE — Telephone Encounter (Signed)
Pt reports Dr. Richardson Landry from [facility] usually prescribes Augmentin or Biaxin.  I explained to her that she can't have Augmentin secondary to her cephalosporin allergy.  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  Thanks,   -Mickel Baas

## 2016-12-24 NOTE — Telephone Encounter (Signed)
Pt was just in and seen [place].  She was given doxycycline (VIBRA-TABS) 100 MG tablet  Pt does not think this will help her.  [phone]    Thanks, Con Memos

## 2016-12-24 NOTE — Patient Instructions (Signed)

## 2016-12-24 NOTE — Telephone Encounter (Signed)
Why??? We discussed this in office. Can't have PCN 2/2 cephalosporin allergy. Didn't want Z-pack. What about it won't work.

## 2016-12-24 NOTE — Progress Notes (Signed)
[place]  Chief Complaint  Patient presents with  . URI    Started about 6 days ago.  . Sinusitis    Subjective:    Patient ID: Theresa Walton, female    DOB: 1966/05/17, 51 y.o.   MRN: [id number]  Upper Respiratory Infection: Theresa Walton is a 51 y.o. female with a past medical history significant for history of tobacco abuse, sinus infections complaining of symptoms of a URI, possible sinusitis. Symptoms include bilateral ear pain, congestion, cough and sore throat. Onset of symptoms was 6 days ago, gradually worsening since that time. She also c/o bilateral ear pressure/pain, congestion and nasal congestion for the past 6 days .  She is drinking plenty of fluids. Evaluation to date: none. Treatment to date: antibiotics. 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 The treatment has provided no relief.   Review of Systems  Constitutional: Positive for fatigue and unexpected weight change. Negative for activity change, appetite change, chills, diaphoresis and fever.  HENT: Positive for congestion, ear pain, nosebleeds, postnasal drip, rhinorrhea, sinus pain, sinus pressure, sore throat, tinnitus, trouble swallowing and voice change. Negative for ear discharge and sneezing.   Eyes: Positive for itching. Negative for photophobia, pain, discharge, redness and visual disturbance.  Respiratory: Positive for cough, chest tightness, shortness of breath and wheezing. Negative for apnea, choking and stridor.   Gastrointestinal: Negative.   Neurological: Negative for dizziness, light-headedness and headaches.       Objective:   BP 118/68 (BP Location: Left Arm, Patient Position: Sitting, Cuff Size: Normal)   Pulse 84   Temp 98.2 F (36.8 C) (Oral)   Resp 16   Wt 136 lb (61.7 kg)   BMI 24.87 kg/m   Patient Active Problem List   Diagnosis Date Noted  . Essential tremor 05/15/2016  . Allergic rhinitis 06/22/2015  . B12 deficiency 06/21/2015  . H/O:  hysterectomy 06/21/2015  . Hypertriglyceridemia 06/21/2015  . Hypoglycemia 06/21/2015  . Arterial blood pressure decreased 06/21/2015  . Menopausal and perimenopausal disorder 06/21/2015  . Vasovagal symptom 06/21/2015  . Vision disturbance 06/21/2015  . Insomnia 04/19/2015  . Night sweats 04/19/2015  . 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 Chronic constipation 08/31/2014  . Adult hypothyroidism 08/28/2009  . Avitaminosis D 08/28/2009  . Arthropathia 02/10/2009  . Chronic infection of sinus 02/01/2009  . Adaptation reaction 09/09/2008  . Genital herpes simplex type 1 infection 07/13/2008  . Current tobacco use 07/13/2008    Outpatient Encounter Prescriptions as of 12/24/2016  Medication Sig Note  . FLUoxetine (PROZAC) 20 MG capsule Take 50 mg by mouth at bedtime.  05/15/2016: Received from: External Pharmacy  . glucosamine-chondroitin 500-400 MG tablet Take 1 tablet by mouth 3 (three) times daily.   Marland Kitchen levothyroxine (SYNTHROID, LEVOTHROID) 25 MCG tablet Take 1 tablet (25 mcg total) by mouth daily before breakfast.   . Melatonin 5 MG TABS Take by mouth.   . predniSONE (STERAPRED UNI-PAK 21 TAB) 10 MG (21) TBPK tablet Take as directed on package instructions   . promethazine (PHENERGAN) 12.5 MG tablet Take 1 tablet (12.5 mg total) by mouth every 6 (six) hours as needed for nausea or vomiting.   . valACYclovir (VALTREX) 500 MG tablet Take 500 mg by mouth 2 (two) times daily.   . clindamycin (CLEOCIN) 150 MG capsule take 1 capsule by mouth every 6 hours until finished   . doxycycline (VIBRA-TABS) 100 MG tablet Take 1 tablet (100 mg total) by mouth 2 (two)  times daily.   Marland Kitchen etodolac (LODINE) 400 MG tablet take 1 tablet by mouth 1 hour prior to procedure then 1 tablet by mouth every 8 hours   . HYDROcodone-homatropine (HYCODAN) 5-1.5 MG/5ML syrup Take 5 mLs by mouth at bedtime as needed for cough.    No facility-administered encounter medications on file as of 12/24/2016.     Allergies   Allergen Reactions  . Cefaclor Anaphylaxis  . Cephalosporins Anaphylaxis  . Lubiprostone     Other reaction(s): Angioedema Tongue Swelling x 2 times.   . Pseudoephedrine     Tachycardia       Physical Exam  Constitutional: She is oriented to person, place, and time. She appears well-developed and well-nourished. No distress.  HENT:  Right Ear: External ear normal.  Left Ear: External ear normal.  Nose: Right sinus exhibits maxillary sinus tenderness and frontal sinus tenderness. Left sinus exhibits maxillary sinus tenderness and frontal sinus tenderness.  Mouth/Throat: Oropharynx is clear and moist. No oropharyngeal exudate, posterior oropharyngeal edema or posterior oropharyngeal erythema.  Tms opaque bilaterally   Eyes: Conjunctivae are normal. Right eye exhibits no discharge. Left eye exhibits no discharge.  Neck: Neck supple.  Cardiovascular: Normal rate and regular rhythm.   Pulmonary/Chest: Breath sounds normal. No respiratory distress. She has no wheezes. She has no rales.  Lymphadenopathy:    She has no cervical adenopathy.  Neurological: She is alert and oriented to person, place, and time.  Skin: Skin is warm and dry. She is not diaphoretic.  Psychiatric: She has a normal mood and affect. Her behavior is normal.       Assessment & Plan:   1. Acute non-recurrent frontal sinusitis  Clindamycin provides some coverage but not all. Counseled patient that she's not quite in the time window for this to be considered bacterial, but patient requests antibiotics. Patient has been on abx extensively in the past mo for UTIs. Counseled on risk of C. Diff and instructed patient to start taking probiotic. Counseled on s/sx of c. Diff.   - doxycycline (VIBRA-TABS) 100 MG tablet; Take 1 tablet (100 mg total) by mouth 2 (two) times daily.  Dispense: 20 tablet; Refill: 0 - HYDROcodone-homatropine (HYCODAN) 5-1.5 MG/5ML syrup; Take 5 mLs by mouth at bedtime as needed for cough.   Dispense: 120 mL; Refill: 0   Recommend rest, fluids, frequent hand washing.   Return if symptoms worsen or fail to improve.  Patient Instructions  Sinusitis, Adult Sinusitis is soreness and inflammation of your sinuses. Sinuses are hollow spaces in the bones around your face. They are located:  Around your eyes.  In the middle of your forehead.  Behind your nose.  In your cheekbones. Your sinuses and nasal passages are lined with a stringy fluid (mucus). Mucus normally drains out of your sinuses. When your nasal tissues get inflamed or swollen, the mucus can get trapped or blocked so air cannot flow through your sinuses. This lets bacteria, viruses, and funguses grow, and that leads to infection. Follow these instructions at home: Medicines   Take, use, or apply over-the-counter and prescription medicines only as told by your doctor. These may include nasal sprays.  If you were prescribed an antibiotic medicine, take it as told by your doctor. Do not stop taking the antibiotic even if you start to feel better. Hydrate and Humidify   Drink enough water to keep your pee (urine) clear or pale yellow.  Use a cool mist humidifier to keep the humidity level in your home  above 50%.  Breathe in steam for 10-15 minutes, 3-4 times a day or as told by your doctor. You can do this in the bathroom while a hot shower is running.  Try not to spend time in cool or dry air. Rest   Rest as much as possible.  Sleep with your head raised (elevated).  Make sure to get enough sleep each night. General instructions   Put a warm, moist washcloth on your face 3-4 times a day or as told by your doctor. This will help with discomfort.  Wash your hands often with soap and water. If there is no soap and water, use hand sanitizer.  Do not smoke. Avoid being around people who are smoking (secondhand smoke).  Keep all follow-up visits as told by your doctor. This is important. Contact a doctor  if:  You have a fever.  Your symptoms get worse.  Your symptoms do not get better within 10 days. Get help right away if:  You have a very bad headache.  You cannot stop throwing up (vomiting).  You have pain or swelling around your face or eyes.  You have trouble seeing.  You feel confused.  Your neck is stiff.  You have trouble breathing. This information is not intended to replace advice given to you by your health care provider. Make sure you discuss any questions you have with your health care provider. Document Released: 04/01/2008 Document Revised: 06/09/2016 Document Reviewed: 08/09/2015 Elsevier Interactive Patient Education  2017 [employer].     The entirety of the information documented in the History of Present Illness, Review of Systems and Physical Exam were personally obtained by me. Portions of this information were initially documented by Ashley Royalty, CMA and reviewed by me for thoroughness and accuracy.

## 2016-12-24 NOTE — Progress Notes (Signed)
Ordered biaxin and discontinued doxycycline.

## 2016-12-29 LAB — GRAM STAIN W/SPUTUM CULT RFLX

## 2016-12-29 LAB — TEST CODE CHANGE

## 2016-12-29 LAB — SPUTUM CULTURE

## 2016-12-30 ENCOUNTER — Telehealth: Payer: Self-pay

## 2016-12-30 MED ORDER — VALACYCLOVIR HCL 500 MG PO TABS: 500.0000 mg | ORAL_TABLET | Freq: Two times a day (BID) | ORAL | 3 refills | Status: DC

## 2016-12-30 NOTE — Telephone Encounter (Signed)
LMTCB-patient also has mychart that she can see her results. LM for patient to call back if any questions or concern.  Thanks,  -Sanjeev Main

## 2016-12-30 NOTE — Telephone Encounter (Signed)
-----   Message from Mar Daring, [place] sent at 12/30/2016 10:50 AM EST ----- Sputum culture was positive for gram neg rods and gram positive cocci which are normal respiratory flora. No abnormal bacteria. See if patient improved with medications please.

## 2016-12-30 NOTE — Addendum Note (Signed)
Addended by: Mar Daring on: 12/30/2016 05:25 PM   Modules accepted: Orders

## 2016-12-31 NOTE — Telephone Encounter (Signed)
Noted  

## 2016-12-31 NOTE — Telephone Encounter (Signed)
Patient advised as below. Patient reports she is still having congestion, dry cough, head ache and jaw pain. Patient reports she has an appointment with ENT tomorrow. sd

## 2017-01-03 ENCOUNTER — Ambulatory Visit: Payer: 59

## 2017-01-14 ENCOUNTER — Encounter: Payer: Self-pay | Admitting: Physician Assistant

## 2017-01-15 ENCOUNTER — Encounter: Payer: Self-pay | Admitting: Physician Assistant

## 2017-01-17 ENCOUNTER — Ambulatory Visit: Payer: 59

## 2017-01-28 ENCOUNTER — Encounter: Payer: Self-pay | Admitting: Physician Assistant

## 2017-01-28 DIAGNOSIS — T3695XA Adverse effect of unspecified systemic antibiotic, initial encounter: Principal | ICD-10-CM

## 2017-01-28 DIAGNOSIS — B379 Candidiasis, unspecified: Secondary | ICD-10-CM

## 2017-01-29 MED ORDER — FLUCONAZOLE 150 MG PO TABS: 150.0000 mg | ORAL_TABLET | Freq: Once | ORAL | 0 refills | Status: AC

## 2017-01-30 ENCOUNTER — Other Ambulatory Visit: Payer: Self-pay | Admitting: Physician Assistant

## 2017-02-10 ENCOUNTER — Encounter: Payer: Self-pay | Admitting: Physician Assistant

## 2017-02-11 ENCOUNTER — Ambulatory Visit
Admission: RE | Admit: 2017-02-11 | Discharge: 2017-02-11 | Disposition: A | Discharge status: Home / Self Care | Payer: 59 | Source: Ambulatory Visit | Attending: Physician Assistant | Admitting: Physician Assistant

## 2017-02-11 DIAGNOSIS — Z1231 Encounter for screening mammogram for malignant neoplasm of breast: Secondary | ICD-10-CM | POA: Diagnosis present

## 2017-02-12 ENCOUNTER — Telehealth: Payer: Self-pay

## 2017-02-12 ENCOUNTER — Encounter: Payer: Self-pay | Admitting: Physician Assistant

## 2017-02-12 NOTE — Telephone Encounter (Signed)
LMTCB.  Pt is also on mychart.  Thanks,  -Giacomo Valone

## 2017-02-12 NOTE — Telephone Encounter (Signed)
-----   Message from Mar Daring, [place] sent at 02/12/2017  4:43 PM EDT ----- Normal mammogram. Repeat screening in one year.

## 2017-02-24 ENCOUNTER — Ambulatory Visit (INDEPENDENT_AMBULATORY_CARE_PROVIDER_SITE_OTHER): Payer: 59 | Admitting: Physician Assistant

## 2017-02-24 ENCOUNTER — Encounter: Payer: Self-pay | Admitting: Physician Assistant

## 2017-02-24 VITALS — BP 106/62 | HR 76 | Temp 97.9°F | Resp 16 | Wt 137.0 lb

## 2017-02-24 DIAGNOSIS — R350 Frequency of micturition: Secondary | ICD-10-CM

## 2017-02-24 DIAGNOSIS — N3090 Cystitis, unspecified without hematuria: Secondary | ICD-10-CM

## 2017-02-24 LAB — POCT URINALYSIS DIPSTICK
Bilirubin, UA: NEGATIVE
Glucose, UA: NEGATIVE
Ketones, UA: NEGATIVE
Nitrite, UA: NEGATIVE
Protein, UA: NEGATIVE
Spec Grav, UA: <=1.005 — AB (ref 1.010–1.025)
Urobilinogen, UA: 0.2 E.U./dL
pH, UA: 7.5 (ref 5.0–8.0)

## 2017-02-24 NOTE — Progress Notes (Signed)
[facility]  Chief Complaint  Patient presents with  . Urinary Tract Infection    Started last week    Subjective:    Patient ID: Theresa Walton, female    DOB: 07/31/1966, 51 y.o.   MRN: [id number]   Urinary Tract Infection: Patient complains of abnormal smelling urine and frequency She has had symptoms for 1 week.  Patient denies back pain, fever, stomach ache and vaginal discharge. Patient does have a history of recurrent UTI.  Patient does not have a history of pyelonephritis or other renal issues. Patient denies vaginal discharge and denies new sexual partners. The patient denies recent travel outside of the [place].  She had one episode of UTI in January that grew out two organisms. Patient wants to wait to start antibiotics until culture comes back because she doesn't want to switch like last time. She has been to see urology, who   Review of Systems  Constitutional: Positive for malaise/fatigue. Negative for chills, diaphoresis, fever and weight loss.  Gastrointestinal: Positive for nausea. Negative for abdominal pain, blood in stool, constipation, diarrhea, heartburn, melena and vomiting.  Genitourinary: Positive for frequency and urgency. Negative for dysuria, flank pain and hematuria.  Neurological: Negative for dizziness, weakness and headaches.        Objective:   BP 106/62 (BP Location: Left Arm, Patient Position: Sitting, Cuff Size: Normal)   Pulse 76   Temp 97.9 F (36.6 C) (Oral)   Resp 16   Wt 137 lb (62.1 kg)   BMI 25.06 kg/m   Patient Active Problem List   Diagnosis Date Noted  . Essential tremor 05/15/2016  . Allergic rhinitis 06/22/2015  . B12 deficiency 06/21/2015  . H/O: hysterectomy 06/21/2015  . Hypertriglyceridemia 06/21/2015  . Hypoglycemia 06/21/2015  . Arterial blood pressure decreased 06/21/2015  . Menopausal and perimenopausal disorder 06/21/2015  . Vasovagal symptom 06/21/2015  . Vision disturbance 06/21/2015  .  Insomnia 04/19/2015  . Night sweats 04/19/2015  . 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 Chronic constipation 08/31/2014  . Adult hypothyroidism 08/28/2009  . Avitaminosis D 08/28/2009  . Arthropathia 02/10/2009  . Chronic infection of sinus 02/01/2009  . Adaptation reaction 09/09/2008  . Genital herpes simplex type 1 infection 07/13/2008  . Current tobacco use 07/13/2008    Outpatient Encounter Prescriptions as of 02/24/2017  Medication Sig Note  . Cranberry 1000 MG CAPS Take by mouth.   Marland Kitchen FLUoxetine (PROZAC) 20 MG capsule Take 50 mg by mouth at bedtime.  05/15/2016: Received from: External Pharmacy  . glucosamine-chondroitin 500-400 MG tablet Take 1 tablet by mouth 3 (three) times daily.   Marland Kitchen levothyroxine (SYNTHROID, LEVOTHROID) 25 MCG tablet TAKE 1 TABLET BY MOUTH  DAILY BEFORE BREAKFAST   . Melatonin 5 MG TABS Take by mouth.   . valACYclovir (VALTREX) 500 MG tablet Take 1 tablet (500 mg total) by mouth 2 (two) times daily.   . [DISCONTINUED] clindamycin (CLEOCIN) 150 MG capsule take 1 capsule by mouth every 6 hours until finished   . [DISCONTINUED] etodolac (LODINE) 400 MG tablet take 1 tablet by mouth 1 hour prior to procedure then 1 tablet by mouth every 8 hours   . [DISCONTINUED] HYDROcodone-homatropine (HYCODAN) 5-1.5 MG/5ML syrup Take 5 mLs by mouth at bedtime as needed for cough.   . [DISCONTINUED] predniSONE (STERAPRED UNI-PAK 21 TAB) 10 MG (21) TBPK tablet Take as directed on package instructions   . [DISCONTINUED] promethazine (PHENERGAN) 12.5 MG tablet Take 1 tablet (12.5 mg total) by mouth every 6 (six)  hours as needed for nausea or vomiting.    No facility-administered encounter medications on file as of 02/24/2017.     Allergies  Allergen Reactions  . Cefaclor Anaphylaxis  . Cephalosporins Anaphylaxis  . Lubiprostone     Other reaction(s): Angioedema Tongue Swelling x 2 times.   . Pseudoephedrine     Tachycardia       Physical Exam  Constitutional: She is  oriented to person, place, and time. She appears well-developed and well-nourished. No distress.  Cardiovascular: Normal rate and regular rhythm.   Pulmonary/Chest: Effort normal and breath sounds normal.  Abdominal: Soft. Bowel sounds are normal. She exhibits no distension. There is no tenderness. There is no rebound, no guarding and no CVA tenderness.  Neurological: She is alert and oriented to person, place, and time.  Skin: Skin is warm and dry. She is not diaphoretic.  Psychiatric: She has a normal mood and affect. Her behavior is normal.       Assessment & Plan:  1. Cystitis  Will wait to start abx until culture comes back.   2. Urinary frequency  - POCT Urinalysis Dipstick - CULTURE, URINE COMPREHENSIVE  Return if symptoms worsen or fail to improve.  The entirety of the information documented in the History of Present Illness, Review of Systems and Physical Exam were personally obtained by me. Portions of this information were initially documented by Ashley Royalty, CMA and reviewed by me for thoroughness and accuracy.

## 2017-02-27 ENCOUNTER — Telehealth: Payer: Self-pay | Admitting: Family Medicine

## 2017-02-27 NOTE — Telephone Encounter (Signed)
Pt called for lab results on her UA and UC.  She was Monday and seen Adriana.  Her call back Is [phone]  Thank sTeri

## 2017-02-28 ENCOUNTER — Other Ambulatory Visit: Payer: Self-pay | Admitting: Physician Assistant

## 2017-02-28 ENCOUNTER — Encounter: Payer: Self-pay | Admitting: Physician Assistant

## 2017-02-28 DIAGNOSIS — N3090 Cystitis, unspecified without hematuria: Secondary | ICD-10-CM

## 2017-02-28 LAB — CULTURE, URINE COMPREHENSIVE

## 2017-02-28 MED ORDER — NITROFURANTOIN MONOHYD MACRO 100 MG PO CAPS: 100.0000 mg | ORAL_CAPSULE | Freq: Two times a day (BID) | ORAL | 0 refills | Status: AC

## 2017-02-28 NOTE — Telephone Encounter (Signed)
Please review

## 2017-02-28 NOTE — Telephone Encounter (Signed)
LMTCB 02/28/2017  Thanks,   -Mickel Baas

## 2017-02-28 NOTE — Progress Notes (Signed)
UTI grew E. Coli sensitive to Macrobid, have sent this in to [facility].

## 2017-02-28 NOTE — Telephone Encounter (Signed)
Pt advised.  She wants to wait on the culture before starting an antibiotic.    Thanks,   -Mickel Baas

## 2017-02-28 NOTE — Telephone Encounter (Signed)
Results not back yet. Will call when they are. We Can try empiric antibiotics, but patient was very against this.

## 2017-02-28 NOTE — Progress Notes (Signed)
Pt advised.   Thanks,   -Everlena Mackley  

## 2017-03-02 ENCOUNTER — Other Ambulatory Visit: Payer: Self-pay | Admitting: Physician Assistant

## 2017-03-02 DIAGNOSIS — M62838 Other muscle spasm: Secondary | ICD-10-CM

## 2017-03-20 ENCOUNTER — Ambulatory Visit (INDEPENDENT_AMBULATORY_CARE_PROVIDER_SITE_OTHER): Payer: 59 | Admitting: Physician Assistant

## 2017-03-20 ENCOUNTER — Telehealth: Payer: Self-pay | Admitting: Physician Assistant

## 2017-03-20 ENCOUNTER — Encounter: Payer: Self-pay | Admitting: Physician Assistant

## 2017-03-20 ENCOUNTER — Other Ambulatory Visit: Payer: Self-pay

## 2017-03-20 VITALS — BP 106/68 | HR 83 | Temp 98.3°F | Wt 138.6 lb

## 2017-03-20 DIAGNOSIS — N390 Urinary tract infection, site not specified: Secondary | ICD-10-CM | POA: Diagnosis not present

## 2017-03-20 DIAGNOSIS — N3090 Cystitis, unspecified without hematuria: Secondary | ICD-10-CM | POA: Diagnosis not present

## 2017-03-20 DIAGNOSIS — R350 Frequency of micturition: Secondary | ICD-10-CM | POA: Diagnosis not present

## 2017-03-20 LAB — POCT URINALYSIS DIPSTICK
Bilirubin, UA: NEGATIVE
Blood, UA: NEGATIVE
Glucose, UA: NEGATIVE
Ketones, UA: NEGATIVE
Nitrite, UA: POSITIVE
Protein, UA: NEGATIVE
Spec Grav, UA: 1.01 (ref 1.010–1.025)
Urobilinogen, UA: 0.2 E.U./dL
pH, UA: 6.5 (ref 5.0–8.0)

## 2017-03-20 MED ORDER — NITROFURANTOIN MONOHYD MACRO 100 MG PO CAPS: 100.0000 mg | ORAL_CAPSULE | Freq: Two times a day (BID) | ORAL | 0 refills | Status: DC

## 2017-03-20 NOTE — Telephone Encounter (Signed)
Can we please call this patient and tell her she will need an appointment to give a urine sample since she usually wants to wait for the culture. Thanks.

## 2017-03-20 NOTE — Patient Instructions (Signed)

## 2017-03-20 NOTE — Telephone Encounter (Signed)
LMTCB

## 2017-03-20 NOTE — Progress Notes (Signed)
Patient: Theresa Walton Female    DOB: 01-03-1966   51 y.o.   MRN: [id number] Visit Date: 03/21/2017  Today's Provider: Trinna Post, PA-C   No chief complaint on file.  Subjective:    Urinary Frequency   This is a recurrent problem. Episode onset: 02/24/2017. The problem occurs every urination. The patient is experiencing no pain. There has been no fever. Associated symptoms include frequency. Associated symptoms comments: Vaginal odor . She has tried antibiotics and increased fluids (probiotics) for the symptoms.   Theresa Walton is a 51 y/o sexually active woman presenting today with urinary frequency and urinary odor. She has a history of multi-organism cystitis in January and the E. Coli cystitis on 02/24/3017. She thinks her UTI from 4/30 never completely went away, despite being treated appropriately with macrobid. She denies fevers, chills, nausea, vomiting, back pain. She denies possibility of STI. She is using D-mannose and probiotics without success. She had previously seen [facility] after her first UTI and was directed to come back after three UTIs in one year. She does not want to see that practice again and is requesting a Biochemist, clinical in [place].     Previous Medications   CRANBERRY 1000 MG CAPS    Take by mouth.   CYCLOBENZAPRINE (FLEXERIL) 5 MG TABLET    take 1 to 2 tablets by mouth at bedtime   FLUOXETINE (PROZAC) 20 MG CAPSULE    Take 50 mg by mouth at bedtime.    GLUCOSAMINE-CHONDROITIN 500-400 MG TABLET    Take 1 tablet by mouth 3 (three) times daily.   LEVOTHYROXINE (SYNTHROID, LEVOTHROID) 25 MCG TABLET    TAKE 1 TABLET BY MOUTH  DAILY BEFORE BREAKFAST   MELATONIN 5 MG TABS    Take by mouth.   VALACYCLOVIR (VALTREX) 500 MG TABLET    Take 1 tablet (500 mg total) by mouth 2 (two) times daily.    Review of Systems  Constitutional: Negative.   Respiratory: Negative.   Cardiovascular: Negative.   Genitourinary: Positive for  frequency.    Social History  Substance Use Topics  . Smoking status: Former Smoker    Types: Cigarettes    Quit date: 05/15/2015  . Smokeless tobacco: Never Used  . Alcohol use No   Objective:   BP 106/68 (BP Location: Right Arm, Patient Position: Sitting, Cuff Size: Normal)   Pulse 83   Temp 98.3 F (36.8 C) (Oral)   Wt 138 lb 9.6 oz (62.9 kg)   SpO2 99%   BMI 25.35 kg/m   Physical Exam  Constitutional: She is oriented to person, place, and time. She appears well-developed and well-nourished. No distress.  Cardiovascular: Normal rate and regular rhythm.   Pulmonary/Chest: Effort normal and breath sounds normal.  Abdominal: Soft. Bowel sounds are normal. She exhibits no distension. There is tenderness in the suprapubic area. There is no rebound, no guarding and no CVA tenderness.  Neurological: She is alert and oriented to person, place, and time.  Skin: Skin is warm and dry. She is not diaphoretic.  Psychiatric: She has a normal mood and affect. Her behavior is normal.        Assessment & Plan:     1. Frequent urination  Urine dipstick indicates cystitis. She agrees to start [place] as prior to UTIs were sensitive to this and we are going into a long weekend.   - POCT Urinalysis Dipstick - CULTURE, URINE COMPREHENSIVE  2. Cystitis  - Ambulatory referral to Urology -  nitrofurantoin, macrocrystal-monohydrate, (MACROBID) 100 MG capsule; Take 1 capsule (100 mg total) by mouth 2 (two) times daily.  Dispense: 14 capsule; Refill: 0  3. Recurrent UTI  - Ambulatory referral to Urology  The entirety of the information documented in the History of Present Illness, Review of Systems and Physical Exam were personally obtained by me. Portions of this information were initially documented by Tiburcio Pea, CMA and reviewed by me for thoroughness and accuracy.       Follow up: Return if symptoms worsen or fail to improve.

## 2017-03-20 NOTE — Telephone Encounter (Signed)
Patient scheduled for a OV today at 4 pm

## 2017-03-23 LAB — CULTURE, URINE COMPREHENSIVE

## 2017-03-23 LAB — PLEASE NOTE

## 2017-03-25 ENCOUNTER — Other Ambulatory Visit: Payer: Self-pay

## 2017-03-25 DIAGNOSIS — N3090 Cystitis, unspecified without hematuria: Secondary | ICD-10-CM

## 2017-03-25 MED ORDER — NITROFURANTOIN MONOHYD MACRO 100 MG PO CAPS: 100.0000 mg | ORAL_CAPSULE | Freq: Two times a day (BID) | ORAL | 0 refills | Status: AC

## 2017-03-25 NOTE — Telephone Encounter (Signed)
-----   Message from Trinna Post, [place] sent at 03/25/2017  8:18 AM EDT ----- Urine culture came back positive for E. Coli sensitive to Macrobid. We can extend the antibiotics to 10 days if she desires. Should be hearing from urology soon.

## 2017-03-25 NOTE — Telephone Encounter (Signed)
Pt advised.  She has an appointment with Urology in July.  She would also like to extend the antibiotics to ten days.  Thanks,   -Mickel Baas

## 2017-03-25 NOTE — Telephone Encounter (Signed)
LMTCB 03/25/2017  Thanks,   -Mickel Baas

## 2017-03-25 NOTE — Telephone Encounter (Signed)
Sent in three more days of Macrobid. To be taken after she completes 7 days of prior script. Please keep urology appointment thanks.

## 2017-04-01 ENCOUNTER — Ambulatory Visit (INDEPENDENT_AMBULATORY_CARE_PROVIDER_SITE_OTHER): Payer: 59 | Admitting: Obstetrics & Gynecology

## 2017-04-01 ENCOUNTER — Encounter: Payer: Self-pay | Admitting: Obstetrics & Gynecology

## 2017-04-01 VITALS — BP 100/60 | HR 83 | Ht 62.0 in | Wt 135.0 lb

## 2017-04-01 DIAGNOSIS — N390 Urinary tract infection, site not specified: Secondary | ICD-10-CM

## 2017-04-01 DIAGNOSIS — Z9889 Other specified postprocedural states: Secondary | ICD-10-CM

## 2017-04-01 DIAGNOSIS — Z960 Presence of urogenital implants: Secondary | ICD-10-CM | POA: Insufficient documentation

## 2017-04-01 LAB — POCT URINALYSIS DIPSTICK
Bilirubin, UA: NEGATIVE
Blood, UA: NEGATIVE
Glucose, UA: NEGATIVE
Ketones, UA: NEGATIVE
Leukocytes, UA: NEGATIVE
Nitrite, UA: NEGATIVE
Protein, UA: NEGATIVE
Spec Grav, UA: 1.025 (ref 1.010–1.025)
Urobilinogen, UA: 0.2 E.U./dL
pH, UA: 6.5 (ref 5.0–8.0)

## 2017-04-01 NOTE — Progress Notes (Signed)
HPI:      [name] is a 51 y.o. G0P0000 w prior TVH and prior sling for incontinence, presents today for a problem visit.    Reports Frequent history of Urinary Tract Infection: Patient complains of abnormal smelling urine and frequency . She has had symptoms for las 6 mos off and on with 2 dx of UTI during that time and no other sx's of concern. Patient also complains of nothing. Patient denies congestion, cough, fever, headache, rhinitis and vaginal discharge. Patient does not have a history of recurrent UTI.  Patient does not have a history of pyelonephritis. Sexually active recently, first UTI prior to being active.  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 Also,  has a past surgical history that includes Tonsilectomy, adenoidectomy, bilateral myringotomy and tubes (1984); Nasal septum surgery (1987, 1991, 2001); Breast enhancement surgery (1995); Rectopexy (2011); Cystocele repair (2013); Endometrial ablation (2013); Arthroscopic repair ACL (2013); Partial hysterectomy (2015); Abdominal hysterectomy; Partial hysterectomy (Bilateral, 2014); Anterior cruciate ligament repair (Right, 2012); Bladder repair; and Augmentation mammaplasty (Bilateral, 2000)., family history includes Arrhythmia in her father; Healthy in her brother, sister, and sister; Heart disease in her father; Lung cancer in her paternal grandmother; Pulmonary fibrosis in her mother.,  reports that she quit smoking about 22 months ago. Her smoking use included Cigarettes. She has never used smokeless tobacco. She reports that she does not drink alcohol or use drugs.  She has a current medication list which includes the following prescription(s): cranberry, cyclobenzaprine, fluoxetine, levothyroxine, melatonin, valacyclovir, glucosamine-chondroitin, and nitrofurantoin (macrocrystal-monohydrate). Also, is allergic to  cefaclor; cephalosporins; lubiprostone; celecoxib; and pseudoephedrine.  Review of Systems  Constitutional: Negative for chills, fever and malaise/fatigue.  HENT: Negative for congestion, sinus pain and sore throat.   Eyes: Negative for blurred vision and pain.  Respiratory: Negative for cough and wheezing.   Cardiovascular: Negative for chest pain and leg swelling.  Gastrointestinal: Negative for abdominal pain, constipation, diarrhea, heartburn, nausea and vomiting.  Genitourinary: Negative for dysuria, frequency, hematuria and urgency.  Musculoskeletal: Negative for back pain, joint pain, myalgias and neck pain.  Skin: Negative for itching and rash.  Neurological: Negative for dizziness, tremors and weakness.  Endo/Heme/Allergies: Does not bruise/bleed easily.  Psychiatric/Behavioral: Negative for depression. The patient is not nervous/anxious and does not have insomnia.     Objective: BP 100/60   Pulse 83   Ht 5\' 2"  (1.575 m)   Wt 135 lb (61.2 kg)   BMI 24.69 kg/m  Physical Exam  Constitutional: She is oriented to person, place, and time. She appears well-developed and well-nourished. No distress.  Genitourinary: Rectum normal and vagina normal. Pelvic exam was performed with patient supine. There is no rash, tenderness or lesion on the right labia. There is no rash, tenderness or lesion on the left labia. No erythema or bleeding in the vagina. Right adnexum does not display mass and does not display tenderness. Left adnexum does not display mass and does not display tenderness.  Genitourinary Comments: Cervix and uterus absent. Vaginal cuff healing well. Sling area intact w no erosion.  Distal vaginal cystocele/enterocele.  No LOU with cough.  Cardiovascular: Normal rate.   Pulmonary/Chest: Effort normal.  Abdominal: Soft. She exhibits no distension. There is no tenderness.  Musculoskeletal: Normal range of motion.  Neurological: She is alert and oriented to person, place, and  time. No cranial nerve deficit.  Skin: Skin is warm and  dry.  Psychiatric: She has a normal mood and affect.    ASSESSMENT/PLAN:   Recent 2 UTIs.  No s/sx sling erosion or concern.  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  Consider IC, urinary retention, diverticuli.  Consider referral to urogyn if recurs.  Did not have good appt w urology last month (brushed off).  Problem List Items Addressed This Visit      Other   History of pubovaginal sling    Other Visit Diagnoses    Frequent UTI    -  Primary   Relevant Medications   nitrofurantoin, macrocrystal-monohydrate, (MACROBID) 100 MG capsule   Other Relevant Orders   POCT urinalysis dipstick (Completed)      Barnett Applebaum, MD, [facility], [facility] 04/01/2017  5:20 PM

## 2017-06-12 ENCOUNTER — Other Ambulatory Visit: Payer: Self-pay | Admitting: Physician Assistant

## 2017-08-13 ENCOUNTER — Encounter: Payer: Self-pay | Admitting: Physician Assistant

## 2017-08-13 ENCOUNTER — Ambulatory Visit (INDEPENDENT_AMBULATORY_CARE_PROVIDER_SITE_OTHER): Payer: 59 | Admitting: Physician Assistant

## 2017-08-13 VITALS — BP 98/70 | HR 95 | Temp 97.8°F | Wt 141.0 lb

## 2017-08-13 DIAGNOSIS — R1032 Left lower quadrant pain: Secondary | ICD-10-CM

## 2017-08-13 DIAGNOSIS — R5383 Other fatigue: Secondary | ICD-10-CM | POA: Diagnosis not present

## 2017-08-13 DIAGNOSIS — R110 Nausea: Secondary | ICD-10-CM

## 2017-08-13 DIAGNOSIS — K5904 Chronic idiopathic constipation: Secondary | ICD-10-CM

## 2017-08-13 DIAGNOSIS — R140 Abdominal distension (gaseous): Secondary | ICD-10-CM

## 2017-08-13 NOTE — Patient Instructions (Signed)

## 2017-08-13 NOTE — Progress Notes (Signed)
Patient: Theresa Walton Female    DOB: April 27, 1966   51 y.o.   MRN: [id number] Visit Date: 08/13/2017  Today's Provider: Mar Daring, PA-C   Chief Complaint  Patient presents with  . Fatigue   Subjective:    HPI Patient is here today with complaints of feeling fatigue and sleeping 12-14 hour the past couple of days. She states she gets up for a couple hours then goes back to bed. She states she is currently being treated for impingement in right shoulder. She is currently taking Meloxicam and Prednisone taper and will start PT next week. She just started meloxicam and prednisone yesterday.    Previous Medications   CYCLOBENZAPRINE (FLEXERIL) 5 MG TABLET    take 1 to 2 tablets by mouth at bedtime   DYMISTA 137-50 MCG/ACT SUSP       ESZOPICLONE 3 MG TABS       FLUOXETINE (PROZAC) 20 MG CAPSULE    Take 50 mg by mouth at bedtime.    GLUCOSAMINE-CHONDROITIN 500-400 MG TABLET    Take 1 tablet by mouth 3 (three) times daily.   IPRATROPIUM (ATROVENT) 0.06 % NASAL SPRAY    instill 2 sprays into each nostril three times a day if needed   LEVOCETIRIZINE (XYZAL) 5 MG TABLET    Take 5 mg by mouth every evening.   LEVOTHYROXINE (SYNTHROID, LEVOTHROID) 25 MCG TABLET    TAKE 1 TABLET BY MOUTH  DAILY BEFORE BREAKFAST   MELATONIN 5 MG TABS    Take by mouth.   MELOXICAM (MOBIC) 15 MG TABLET    take 1 tablet by mouth once daily with meals   MONTELUKAST (SINGULAIR) 10 MG TABLET    Take 10 mg by mouth daily.   VALACYCLOVIR (VALTREX) 500 MG TABLET    Take 1 tablet (500 mg total) by mouth 2 (two) times daily.    Review of Systems  Constitutional: Positive for fatigue.  Respiratory: Negative.   Cardiovascular: Negative.   Gastrointestinal: Positive for abdominal distention. Negative for constipation, diarrhea and rectal pain.  Neurological: Negative.   Psychiatric/Behavioral: Negative.  Sleep disturbance: over sleeping     Social History  Substance Use Topics  . Smoking status: Former Smoker     Types: Cigarettes    Quit date: 05/15/2015  . Smokeless tobacco: Never Used  . Alcohol use No   Objective:   BP 98/70 (BP Location: Right Arm, Patient Position: Sitting, Cuff Size: Normal)   Temp 97.8 F (36.6 C) (Oral)   Wt 141 lb (64 kg)   BMI 25.79 kg/m   Physical Exam  Constitutional: She appears well-developed and well-nourished. No distress.  HENT:  Head: Normocephalic and atraumatic.  Right Ear: Hearing, tympanic membrane, external ear and ear canal normal.  Left Ear: Hearing, tympanic membrane, external ear and ear canal normal.  Nose: Nose normal.  Mouth/Throat: Uvula is midline, oropharynx is clear and moist and mucous membranes are normal. No oropharyngeal exudate.  Eyes: Pupils are equal, round, and reactive to light. Conjunctivae are normal. Right eye exhibits no discharge. Left eye exhibits no discharge. No scleral icterus.  Neck: Normal range of motion. Neck supple. No JVD present. No tracheal deviation present. No thyromegaly present.  Cardiovascular: Normal rate, regular rhythm and normal heart sounds.  Exam reveals no gallop and no friction rub.   No murmur heard. Pulmonary/Chest: Effort normal and breath sounds normal. No stridor. No respiratory distress. She has no wheezes. She has no rales.  Abdominal: Soft. Bowel sounds are  normal. She exhibits no distension and no mass. There is tenderness in the left lower quadrant. There is no rebound and no guarding.  Lymphadenopathy:    She has no cervical adenopathy.  Skin: Skin is warm and dry. She is not diaphoretic.  Psychiatric: Her speech is normal and behavior is normal. Judgment and thought content normal. Cognition and memory are normal. She exhibits a depressed mood.  Flat   Vitals reviewed.  Depression screen [facility] 2/9 08/13/2017 01/22/2016  Decreased Interest 0 0  Down, Depressed, Hopeless 0 0  PHQ - 2 Score 0 0       Assessment & Plan:     1. Fatigue, unspecified type Unsure of cause. No URI  symptoms, no GI symptoms, no UTI symptoms, no vaginal discharge. Patient did have LLQ tenderness to palpation and complaining of bloating. Will check labs as below and get abdominal xray. I will f/u pending results.  - CBC w/Diff/Platelet - TSH - B12 - Vitamin D (25 hydroxy) - Comprehensive Metabolic Panel (CMET) - DG Abd 2 Views; Future  2. LLQ pain See above medical treatment plan. R/O diverticulitis, renal stone. - DG Abd 2 Views; Future  3. Nausea See above medical treatment plan. - DG Abd 2 Views; Future   Follow up: No Follow-up on file.

## 2017-08-14 ENCOUNTER — Ambulatory Visit
Admission: RE | Admit: 2017-08-14 | Discharge: 2017-08-14 | Disposition: A | Discharge status: Home / Self Care | Payer: 59 | Source: Ambulatory Visit | Attending: Physician Assistant | Admitting: Physician Assistant

## 2017-08-14 DIAGNOSIS — R1032 Left lower quadrant pain: Secondary | ICD-10-CM | POA: Diagnosis not present

## 2017-08-14 DIAGNOSIS — K5641 Fecal impaction: Secondary | ICD-10-CM | POA: Insufficient documentation

## 2017-08-14 DIAGNOSIS — R110 Nausea: Secondary | ICD-10-CM

## 2017-08-14 DIAGNOSIS — R5383 Other fatigue: Secondary | ICD-10-CM

## 2017-08-14 LAB — TSH: TSH: 3.66 u[IU]/mL (ref 0.450–4.500)

## 2017-08-14 LAB — CBC WITH DIFFERENTIAL/PLATELET
Basophils Absolute: 0 10*3/uL (ref 0.0–0.2)
Basos: 0 %
EOS (ABSOLUTE): 0 10*3/uL (ref 0.0–0.4)
Eos: 0 %
Hematocrit: 34.8 % (ref 34.0–46.6)
Hemoglobin: 11.1 g/dL (ref 11.1–15.9)
Immature Grans (Abs): 0 10*3/uL (ref 0.0–0.1)
Immature Granulocytes: 0 %
Lymphocytes Absolute: 2.5 10*3/uL (ref 0.7–3.1)
Lymphs: 28 %
MCH: 28.3 pg (ref 26.6–33.0)
MCHC: 31.9 g/dL (ref 31.5–35.7)
MCV: 89 fL (ref 79–97)
Monocytes Absolute: 1 10*3/uL — ABNORMAL HIGH (ref 0.1–0.9)
Monocytes: 11 %
Neutrophils Absolute: 5.3 10*3/uL (ref 1.4–7.0)
Neutrophils: 61 %
Platelets: 313 10*3/uL (ref 150–379)
RBC: 3.92 x10E6/uL (ref 3.77–5.28)
RDW: 12.5 % (ref 12.3–15.4)
WBC: 8.7 10*3/uL (ref 3.4–10.8)

## 2017-08-14 LAB — COMPREHENSIVE METABOLIC PANEL
ALT: 12 IU/L (ref 0–32)
AST: 17 IU/L (ref 0–40)
Albumin/Globulin Ratio: 1.8 (ref 1.2–2.2)
Albumin: 4.2 g/dL (ref 3.5–5.5)
Alkaline Phosphatase: 56 IU/L (ref 39–117)
BUN/Creatinine Ratio: 19 (ref 9–23)
BUN: 14 mg/dL (ref 6–24)
Bilirubin Total: <0.2 mg/dL (ref 0.0–1.2)
CO2: 22 mmol/L (ref 20–29)
Calcium: 9.1 mg/dL (ref 8.7–10.2)
Chloride: 106 mmol/L (ref 96–106)
Creatinine, Ser: 0.72 mg/dL (ref 0.57–1.00)
GFR calc Af Amer: 111 mL/min/{1.73_m2} (ref >59)
GFR calc non Af Amer: 97 mL/min/{1.73_m2} (ref >59)
Globulin, Total: 2.3 g/dL (ref 1.5–4.5)
Glucose: 84 mg/dL (ref 65–99)
Potassium: 3.6 mmol/L (ref 3.5–5.2)
Sodium: 141 mmol/L (ref 134–144)
Total Protein: 6.5 g/dL (ref 6.0–8.5)

## 2017-08-14 LAB — VITAMIN D 25 HYDROXY (VIT D DEFICIENCY, FRACTURES): Vit D, 25-Hydroxy: 32.8 ng/mL (ref 30.0–100.0)

## 2017-08-14 LAB — VITAMIN B12: Vitamin B-12: 358 pg/mL (ref 232–1245)

## 2017-08-18 NOTE — Addendum Note (Signed)
Addended by: Mar Daring on: 08/18/2017 10:01 AM   Modules accepted: Orders

## 2017-08-20 ENCOUNTER — Ambulatory Visit (INDEPENDENT_AMBULATORY_CARE_PROVIDER_SITE_OTHER): Payer: 59 | Admitting: Gastroenterology

## 2017-08-20 ENCOUNTER — Encounter: Payer: Self-pay | Admitting: Gastroenterology

## 2017-08-20 VITALS — BP 124/77 | HR 94 | Temp 98.4°F | Ht 62.0 in | Wt 141.6 lb

## 2017-08-20 DIAGNOSIS — K5909 Other constipation: Secondary | ICD-10-CM | POA: Diagnosis not present

## 2017-08-20 NOTE — Addendum Note (Signed)
Addended by: Peggye Ley on: 08/20/2017 10:33 AM   Modules accepted: Orders, SmartSet

## 2017-08-20 NOTE — Progress Notes (Signed)
Jonathon Bellows MD, MRCP(U.K) [address]  [place]  [place],  [address]  Main: [phone]  Fax: [phone]   Gastroenterology Consultation  Referring Provider:     Florian Buff* Primary Care Physician:  Birdie Sons, MD Primary Gastroenterologist:  Dr. Jonathon Bellows  Reason for Consultation:     Constipation         HPI:   Theresa Walton is a 51 y.o. y/o female referred for consultation & management  by Dr. Birdie Sons, MD.     She used to be seen by Dr Rayann Heman last seen in 08/2014. Had tried [place] which made her lips swell. Miralax didn't work . CT scan in 05/2016 showed no gross abnormality of the abdomen . X ray abdomen 08/14/17 showed stool throughout the colon . TSH normal .  She has had a prolapsed rectum, cystocele repaired a few years back .   Constipation: Onset she says that mostly has constiption- 5-10 years Frequency of bowel movements : has a bowel movement 2-3 days  Consistency like rocks,no pain  Shape large pebbles , started taking NSAID's for her shoulder-worsened her constipation Pain no  Bloating/abdominal distension - yes, but better after a bowel movement  Bleeding no  Last colonoscopy 5-8 years back  Weight loss no  Family history of colon cancer no  Diet eats a lot of fruit and vegetables, nuts, not much grain  Narcotic/anticholinergic medications no  Laxative use : Stool softners , cant recall use of linzess  Thyroid abnormalities yes   Never had children , had a hystrectomy . No need for manual disimpaction . No insertion of finger into the vagina to help with a bowel movement . Has to lean back to help have a bowel movement. She is not sexually active.      Past Medical History:  Diagnosis Date  . 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[place])   . Genital herpes   . Seasonal allergies   . Syncope and collapse   . Thyroid disease    hypothyroidism     Past Surgical History:  Procedure Laterality Date  . ABDOMINAL  HYSTERECTOMY    . ANTERIOR CRUCIATE LIGAMENT REPAIR Right 2012  . ARTHROSCOPIC REPAIR ACL  2013   Right knee  . AUGMENTATION MAMMAPLASTY Bilateral 2000   saline  . BLADDER REPAIR    . BREAST ENHANCEMENT SURGERY  1995  . CYSTOCELE REPAIR  2013  . ENDOMETRIAL ABLATION  2013  . [place], 2001  . PARTIAL HYSTERECTOMY  2015  . PARTIAL HYSTERECTOMY Bilateral 2014  . RECTOPEXY  2011  . TONSILECTOMY, ADENOIDECTOMY, BILATERAL MYRINGOTOMY AND TUBES  1984    Prior to Admission medications   Medication Sig Start Date End Date Taking? Authorizing Provider  cyclobenzaprine (FLEXERIL) 5 MG tablet take 1 to 2 tablets by mouth at bedtime 03/03/17   Mar Daring, [place]  DYMISTA 137-50 MCG/ACT SUSP  07/08/17   [provider]  Eszopiclone 3 MG TABS  05/26/17   [provider]  FLUoxetine (PROZAC) 20 MG capsule Take 50 mg by mouth at bedtime.  04/22/16   [provider]  glucosamine-chondroitin 500-400 MG tablet Take 1 tablet by mouth 3 (three) times daily.    [provider]  ipratropium (ATROVENT) 0.06 % nasal spray instill 2 sprays into each nostril three times a day if needed 07/30/17   [provider]  levocetirizine (XYZAL) 5 MG tablet Take 5 mg by mouth every  evening. 07/30/17   [provider]  levothyroxine (SYNTHROID, LEVOTHROID) 25 MCG tablet TAKE 1 TABLET BY MOUTH  DAILY BEFORE BREAKFAST 06/13/17   Mar Daring, PA-C  Melatonin 5 MG TABS Take by mouth. 05/15/16   Mar Daring, PA-C  meloxicam (MOBIC) 15 MG tablet take 1 tablet by mouth once daily with meals 08/11/17   [provider]  montelukast (SINGULAIR) 10 MG tablet Take 10 mg by mouth daily. 07/30/17   [provider]  valACYclovir (VALTREX) 500 MG tablet Take 1 tablet (500 mg total) by mouth 2 (two) times daily. 12/30/16   Mar Daring, PA-C    Family History  Problem Relation Age of Onset  . Pulmonary fibrosis Mother     . Healthy Sister   . Healthy Brother   . Healthy Sister   . Lung cancer Paternal Grandmother   . Arrhythmia Father   . Heart disease Father   . Prostate cancer Neg Hx   . Bladder Cancer Neg Hx   . Kidney cancer Neg Hx      Social History  Substance Use Topics  . Smoking status: Former Smoker    Types: Cigarettes    Quit date: 05/15/2015  . Smokeless tobacco: Never Used  . Alcohol use No    Allergies as of 08/20/2017 - Review Complete 08/13/2017  Allergen Reaction Noted  . Cefaclor Anaphylaxis 04/19/2015  . Cephalosporins Anaphylaxis 04/19/2015  . Lubiprostone  09/19/2014  . Celecoxib    . Lac bovis  04/15/2014  . Pseudoephedrine Palpitations 04/14/2014    Review of Systems:    All systems reviewed and negative except where noted in HPI.   Physical Exam:  There were no vitals taken for this visit. No LMP recorded. Patient has had a hysterectomy. Psych:  Alert and cooperative. Normal mood and affect. General:   Alert,  Well-developed, well-nourished, pleasant and cooperative in NAD Head:  Normocephalic and atraumatic. Eyes:  Sclera clear, no icterus.   Conjunctiva pink. Ears:  Normal auditory acuity. Nose:  No deformity, discharge, or lesions. Mouth:  No deformity or lesions,oropharynx pink & moist. Neck:  Supple; no masses or thyromegaly. Lungs:  Respirations even and unlabored.  Clear throughout to auscultation.   No wheezes, crackles, or rhonchi. No acute distress. Heart:  Regular rate and rhythm; no murmurs, clicks, rubs, or gallops. Abdomen:  Normal bowel sounds.  No bruits.  Soft, non-tender and non-distended without masses, hepatosplenomegaly or hernias noted.  No guarding or rebound tenderness.    Neurologic:  Alert and oriented x3;  grossly normal neurologically. Skin:  Intact without significant lesions or rashes. No jaundice. Lymph Nodes:  No significant cervical adenopathy. Psych:  Alert and cooperative. Normal mood and affect.  Imaging Studies: Dg Abd  2 Views  Result Date: 08/14/2017 CLINICAL DATA:  Bloating.  Gassiness.  Left lower quadrant pain. EXAM: ABDOMEN - 2 VIEW COMPARISON:  CT 06/14/2016. FINDINGS: Soft tissue structures are unremarkable. Stool noted throughout the colon suggesting constipation. No bowel distention. Pelvic calcifications consistent phleboliths. No acute bony abnormality . IMPRESSION: Stool noted throughout the colon suggesting constipation. No acute abnormality identified. Electronically Signed   By: Marcello Moores  Register   On: 08/14/2017 11:54    Assessment and Plan:   Theresa Walton is a 51 y.o. y/o female has been referred for chronic constipation. My impression is that her prior pelvic surgery has played a part with her constipation and altered the anrorectal anatomy,angle and mechanism.   Plan  1. Diagnostic colonoscopy  to r/o any luminal issues 2. Trial of Trulance , samples provided for 2 weeks 3. If no better will need sitz marker study +/- anorectal manometry.  4. Stop al other laxatives  Follow up in 6 weeks   Dr Jonathon Bellows MD,MRCP(U.K)

## 2017-08-22 ENCOUNTER — Encounter: Payer: Self-pay | Admitting: Gastroenterology

## 2017-08-29 ENCOUNTER — Ambulatory Visit: Payer: 59 | Admitting: Anesthesiology

## 2017-08-29 ENCOUNTER — Encounter: Payer: Self-pay | Admitting: *Deleted

## 2017-08-29 ENCOUNTER — Ambulatory Visit
Admission: RE | Admit: 2017-08-29 | Discharge: 2017-08-29 | Disposition: A | Discharge status: Home / Self Care | Payer: 59 | Source: Ambulatory Visit | Attending: Gastroenterology | Admitting: Gastroenterology

## 2017-08-29 ENCOUNTER — Encounter
Admission: RE | Disposition: A | Discharge status: Home / Self Care | Payer: Self-pay | Source: Ambulatory Visit | Attending: Gastroenterology

## 2017-08-29 DIAGNOSIS — Z79899 Other long term (current) drug therapy: Secondary | ICD-10-CM | POA: Diagnosis not present

## 2017-08-29 DIAGNOSIS — E039 Hypothyroidism, unspecified: Secondary | ICD-10-CM | POA: Diagnosis not present

## 2017-08-29 DIAGNOSIS — Z7951 Long term (current) use of inhaled steroids: Secondary | ICD-10-CM | POA: Insufficient documentation

## 2017-08-29 DIAGNOSIS — B009 Herpesviral infection, unspecified: Secondary | ICD-10-CM | POA: Diagnosis not present

## 2017-08-29 DIAGNOSIS — Z87891 Personal history of nicotine dependence: Secondary | ICD-10-CM | POA: Diagnosis not present

## 2017-08-29 DIAGNOSIS — J302 Other seasonal allergic rhinitis: Secondary | ICD-10-CM | POA: Insufficient documentation

## 2017-08-29 DIAGNOSIS — K5900 Constipation, unspecified: Secondary | ICD-10-CM | POA: Insufficient documentation

## 2017-08-29 DIAGNOSIS — K5909 Other constipation: Secondary | ICD-10-CM

## 2017-08-29 HISTORY — PX: COLONOSCOPY WITH PROPOFOL: SHX5780

## 2017-08-29 HISTORY — DX: Hypothyroidism, unspecified: E03.9

## 2017-08-29 SURGERY — COLONOSCOPY WITH PROPOFOL
Anesthesia: General

## 2017-08-29 MED ORDER — PROPOFOL 10 MG/ML IV BOLUS
INTRAVENOUS | Status: DC | PRN
  Administered 2017-08-29: 80 mg via INTRAVENOUS
  Administered 2017-08-29: 40 mg via INTRAVENOUS
  Administered 2017-08-29: 20 mg via INTRAVENOUS

## 2017-08-29 MED ORDER — SODIUM CHLORIDE 0.9 % IV SOLN
INTRAVENOUS | Status: DC
  Administered 2017-08-29: 1000 mL via INTRAVENOUS

## 2017-08-29 MED ORDER — PROPOFOL 500 MG/50ML IV EMUL
INTRAVENOUS | Status: AC
  Filled 2017-08-29: qty 50

## 2017-08-29 MED ORDER — PROPOFOL 500 MG/50ML IV EMUL
INTRAVENOUS | Status: DC | PRN
  Administered 2017-08-29: 140 ug/kg/min via INTRAVENOUS

## 2017-08-29 MED ORDER — LIDOCAINE HCL (PF) 1 % IJ SOLN
2.0000 mL | Freq: Once | INTRAMUSCULAR | Status: AC
  Administered 2017-08-29: 0.3 mL via INTRADERMAL

## 2017-08-29 MED ORDER — LIDOCAINE HCL (PF) 1 % IJ SOLN
INTRAMUSCULAR | Status: AC
  Administered 2017-08-29: 0.3 mL via INTRADERMAL
  Filled 2017-08-29: qty 2

## 2017-08-29 NOTE — Transfer of Care (Signed)
Immediate Anesthesia Transfer of Care Note  Patient: [facility]  Procedure(s) Performed: COLONOSCOPY WITH PROPOFOL (N/A )  Patient Location: Endoscopy Unit  Anesthesia Type:General  Level of Consciousness: awake  Airway & Oxygen Therapy: Patient Spontanous Breathing and Patient connected to nasal cannula oxygen  Post-op Assessment: Report given to RN and Post -op Vital signs reviewed and stable  Post vital signs: Reviewed and stable  Last Vitals:  Vitals:   08/29/17 1007  BP: 105/75  Pulse: 76  Resp: 17  Temp: 36.6 C  SpO2: 100%    Last Pain:  Vitals:   08/29/17 1007  TempSrc: Tympanic         Complications: No apparent anesthesia complications

## 2017-08-29 NOTE — Op Note (Signed)
[facility] Gastroenterology Patient Name: Theresa Walton Procedure Date: 08/29/2017 10:05 AM MRN: [id number] Account #: [id number] Date of Birth: 08/26/1966 Admit Type: Outpatient Age: 51 Room: [facility] ENDO ROOM 4 Gender: Female Note Status: Finalized Procedure:            Colonoscopy Indications:          Constipation Providers:            Jonathon Bellows MD, MD Referring MD:         Kirstie Peri. Caryn Section, MD (Referring MD) Medicines:            Monitored Anesthesia Care Complications:        No immediate complications. Procedure:            Pre-Anesthesia Assessment:                       - Prior to the procedure, a History and Physical was                        performed, and patient medications, allergies and                        sensitivities were reviewed. The patient's tolerance of                        previous anesthesia was reviewed.                       - The risks and benefits of the procedure and the                        sedation options and risks were discussed with the                        patient. All questions were answered and informed                        consent was obtained.                       - ASA Grade Assessment: III - A patient with severe                        systemic disease.                       After obtaining informed consent, the colonoscope was                        passed under direct vision. Throughout the procedure,                        the patient's blood pressure, pulse, and oxygen                        saturations were monitored continuously. The                        Colonoscope was introduced through the anus and                        advanced to  the the cecum, identified by the                        appendiceal orifice, IC valve and transillumination.                        The colonoscopy was performed with ease. The patient                        tolerated the procedure well. The quality of the bowel                    preparation was good. Findings:      The entire examined colon appeared normal on direct and retroflexion       views. Impression:           - The entire examined colon is normal on direct and                        retroflexion views.                       - No specimens collected. Recommendation:       - Discharge patient to home (with escort).                       - Repeat colonoscopy in 10 years for screening purposes. Procedure Code(s):    --- Professional ---                       [phone], Colonoscopy, flexible; diagnostic, including                        collection of specimen(s) by brushing or washing, when                        performed (separate procedure) Diagnosis Code(s):    --- Professional ---                       K59.00, Constipation, unspecified CPT copyright 2016 American Medical Association. All rights reserved. The codes documented in this report are preliminary and upon coder review may  be revised to meet current compliance requirements. Jonathon Bellows, MD Jonathon Bellows MD, MD 08/29/2017 10:54:39 AM This report has been signed electronically. Number of Addenda: 0 Note Initiated On: 08/29/2017 10:05 AM Scope Withdrawal Time: 0 hours 10 minutes 15 seconds  Total Procedure Duration: 0 hours 16 minutes 1 second       [facility]

## 2017-08-29 NOTE — Anesthesia Postprocedure Evaluation (Signed)
Anesthesia Post Note  Patient: Pollyanna Puello  Procedure(s) Performed: COLONOSCOPY WITH PROPOFOL (N/A )  Patient location during evaluation: Endoscopy Anesthesia Type: General Level of consciousness: awake and alert Pain management: pain level controlled Vital Signs Assessment: post-procedure vital signs reviewed and stable Respiratory status: spontaneous breathing, nonlabored ventilation, respiratory function stable and patient connected to nasal cannula oxygen Cardiovascular status: blood pressure returned to baseline and stable Postop Assessment: no apparent nausea or vomiting Anesthetic complications: no     Last Vitals:  Vitals:   08/29/17 1110 08/29/17 1120  BP: 104/77 112/67  Pulse:    Resp:    Temp:    SpO2:      Last Pain:  Vitals:   08/29/17 1057  TempSrc: Tympanic                 Precious Haws Azhane Eckart

## 2017-08-29 NOTE — H&P (Signed)
Jonathon Bellows, MD [address] [place], [place], [place], [address] [address], [place], [place], [place], [address] Phone: [phone]  Fax: [phone]  Primary Care Physician:  Birdie Sons, MD   Pre-Procedure History & Physical: HPI:  Theresa Walton is a 51 y.o. female is here for an colonoscopy.   Past Medical History:  Diagnosis Date  . 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[place])   . Genital herpes   . Seasonal allergies   . Syncope and collapse   . Thyroid disease    hypothyroidism    Past Surgical History:  Procedure Laterality Date  . ABDOMINAL HYSTERECTOMY    . ANTERIOR CRUCIATE LIGAMENT REPAIR Right 2012  . ARTHROSCOPIC REPAIR ACL  2013   Right knee  . AUGMENTATION MAMMAPLASTY Bilateral 2000   saline  . BLADDER REPAIR    . BREAST ENHANCEMENT SURGERY  1995  . CYSTOCELE REPAIR  2013  . ENDOMETRIAL ABLATION  2013  . [place], 2001  . PARTIAL HYSTERECTOMY  2015  . PARTIAL HYSTERECTOMY Bilateral 2014  . RECTOPEXY  2011  . TONSILECTOMY, ADENOIDECTOMY, BILATERAL MYRINGOTOMY AND TUBES  1984    Prior to Admission medications   Medication Sig Start Date End Date Taking? Authorizing Provider  cyclobenzaprine (FLEXERIL) 5 MG tablet take 1 to 2 tablets by mouth at bedtime 03/03/17   Mar Daring, [place]  DYMISTA 137-50 MCG/ACT SUSP  07/08/17   [provider]  Eszopiclone 3 MG TABS  05/26/17   [provider]  FLUoxetine (PROZAC) 20 MG capsule Take 50 mg by mouth at bedtime.  04/22/16   [provider]  glucosamine-chondroitin 500-400 MG tablet Take 1 tablet by mouth 3 (three) times daily.    [provider]  ipratropium (ATROVENT) 0.06 % nasal spray instill 2 sprays into each nostril three times a day if needed 07/30/17   [provider]  levocetirizine (XYZAL) 5 MG tablet Take 5 mg by mouth every evening. 07/30/17   [provider]  levothyroxine (SYNTHROID, LEVOTHROID) 25 MCG tablet  TAKE 1 TABLET BY MOUTH  DAILY BEFORE BREAKFAST 06/13/17   Mar Daring, PA-C  Melatonin 5 MG TABS Take by mouth. 05/15/16   Mar Daring, PA-C  meloxicam (MOBIC) 15 MG tablet take 1 tablet by mouth once daily with meals 08/11/17   [provider]  montelukast (SINGULAIR) 10 MG tablet Take 10 mg by mouth daily. 07/30/17   [provider]  valACYclovir (VALTREX) 500 MG tablet Take 1 tablet (500 mg total) by mouth 2 (two) times daily. 12/30/16   Mar Daring, PA-C    Allergies as of 08/20/2017 - Review Complete 08/20/2017  Allergen Reaction Noted  . Cefaclor Anaphylaxis 04/19/2015  . Cephalosporins Anaphylaxis 04/19/2015  . Lubiprostone  09/19/2014  . Celecoxib    . Lac bovis  04/15/2014  . Pseudoephedrine Palpitations 04/14/2014    Family History  Problem Relation Age of Onset  . Pulmonary fibrosis Mother   . Healthy Sister   . Healthy Brother   . Healthy Sister   . Lung cancer Paternal Grandmother   . Arrhythmia Father   . Heart disease Father   . Prostate cancer Neg Hx   . Bladder Cancer Neg Hx   . Kidney cancer Neg Hx     Social History   Social History  . Marital status: Divorced    Spouse name: N/A  . Number of children: N/A  . Years of education:  N/A   Occupational History  . Not on file.   Social History Main Topics  . Smoking status: Former Smoker    Types: Cigarettes    Quit date: 05/15/2015  . Smokeless tobacco: Never Used  . Alcohol use No  . Drug use: No  . Sexual activity: No   Other Topics Concern  . Not on file   Social History Narrative  . No narrative on file    Review of Systems: See HPI, otherwise negative ROS  Physical Exam: There were no vitals taken for this visit. General:   Alert,  pleasant and cooperative in NAD Head:  Normocephalic and atraumatic. Neck:  Supple; no masses or thyromegaly. Lungs:  Clear throughout to auscultation, normal respiratory effort.    Heart:  +S1, +S2, Regular rate  and rhythm, No edema. Abdomen:  Soft, nontender and nondistended. Normal bowel sounds, without guarding, and without rebound.   Neurologic:  Alert and  oriented x4;  grossly normal neurologically.  Impression/Plan: Theresa Walton is here for an colonoscopy to be performed for constipation   Risks, benefits, limitations, and alternatives regarding  colonoscopy have been reviewed with the patient.  Questions have been answered.  All parties agreeable.   Jonathon Bellows, MD  08/29/2017, 10:01 AM

## 2017-08-29 NOTE — Anesthesia Preprocedure Evaluation (Signed)
Anesthesia Evaluation  Patient identified by MRN, date of birth, ID band Patient awake    Reviewed: Allergy & Precautions, H&P , NPO status , Patient's Chart, lab work & pertinent test results  History of Anesthesia Complications Negative for: history of anesthetic complications  Airway Mallampati: III  TM Distance: >3 FB Neck ROM: full    Dental  (+) Chipped   Pulmonary neg shortness of breath, former smoker,           Cardiovascular Exercise Tolerance: Good (-) angina(-) Past MI and (-) DOE negative cardio ROS       Neuro/Psych PSYCHIATRIC DISORDERS negative neurological ROS  negative psych ROS   GI/Hepatic negative GI ROS, Neg liver ROS, neg GERD  ,  Endo/Other  Hypothyroidism   Renal/GU negative Renal ROS  negative genitourinary   Musculoskeletal   Abdominal   Peds  Hematology negative hematology ROS (+)   Anesthesia Other Findings Past Medical History: No date: 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[facility]) No date: Genital herpes No date: Hypothyroidism No date: Seasonal allergies No date: Syncope and collapse No date: Thyroid disease     Comment:  hypothyroidism  Past Surgical History: No date: ABDOMINAL HYSTERECTOMY 2012: ANTERIOR CRUCIATE LIGAMENT REPAIR; Right 2013: ARTHROSCOPIC REPAIR ACL     Comment:  Right knee 2000: AUGMENTATION MAMMAPLASTY; Bilateral     Comment:  saline No date: BLADDER REPAIR 1995: BREAST ENHANCEMENT SURGERY 2013: CYSTOCELE REPAIR 2013: ENDOMETRIAL ABLATION 1987, 1991, 2001: NASAL SEPTUM SURGERY 2015: PARTIAL HYSTERECTOMY 2014: PARTIAL HYSTERECTOMY; Bilateral 2011: RECTOPEXY 1984: TONSILECTOMY, ADENOIDECTOMY, BILATERAL MYRINGOTOMY AND TUBES  BMI    Body Mass Index:  24.69 kg/m      Reproductive/Obstetrics negative OB ROS                             Anesthesia Physical Anesthesia Plan  ASA: III  Anesthesia Plan: General   Post-op Pain  Management:    Induction: Intravenous  PONV Risk Score and Plan: Propofol infusion  Airway Management Planned: Natural Airway and Nasal Cannula  Additional Equipment:   Intra-op Plan:   Post-operative Plan:   Informed Consent: I have reviewed the patients History and Physical, chart, labs and discussed the procedure including the risks, benefits and alternatives for the proposed anesthesia with the patient or authorized representative who has indicated his/her understanding and acceptance.   Dental Advisory Given  Plan Discussed with: Anesthesiologist, CRNA and Surgeon  Anesthesia Plan Comments: (Patient consented for risks of anesthesia including but not limited to:  - adverse reactions to medications - risk of intubation if required - damage to teeth, lips or other oral mucosa - sore throat or hoarseness - Damage to heart, brain, lungs or loss of life  Patient voiced understanding.)        Anesthesia Quick Evaluation

## 2017-08-29 NOTE — Anesthesia Post-op Follow-up Note (Signed)
Anesthesia QCDR form completed.        

## 2017-09-01 ENCOUNTER — Encounter: Payer: Self-pay | Admitting: Gastroenterology

## 2017-09-03 ENCOUNTER — Telehealth: Payer: Self-pay | Admitting: Gastroenterology

## 2017-09-03 NOTE — Telephone Encounter (Signed)
Patient left a voice message that she needs a RX for the constipation medication. She doesn't know what the name of it is [place]

## 2017-09-04 ENCOUNTER — Encounter: Payer: Self-pay | Admitting: Gastroenterology

## 2017-09-05 ENCOUNTER — Other Ambulatory Visit: Payer: Self-pay

## 2017-09-05 ENCOUNTER — Other Ambulatory Visit: Payer: Self-pay | Admitting: Physician Assistant

## 2017-09-05 DIAGNOSIS — M62838 Other muscle spasm: Secondary | ICD-10-CM

## 2017-09-05 MED ORDER — PLECANATIDE 3 MG PO TABS: 3.0000 mg | ORAL_TABLET | Freq: Every morning | ORAL | 3 refills | Status: AC

## 2017-09-08 ENCOUNTER — Other Ambulatory Visit: Payer: Self-pay

## 2017-09-08 MED ORDER — LUBIPROSTONE 24 MCG PO CAPS: 24.0000 ug | ORAL_CAPSULE | Freq: Two times a day (BID) | ORAL | 3 refills | Status: AC

## 2017-09-16 DIAGNOSIS — M545 Low back pain, unspecified: Secondary | ICD-10-CM | POA: Insufficient documentation

## 2017-09-16 DIAGNOSIS — M542 Cervicalgia: Secondary | ICD-10-CM | POA: Insufficient documentation

## 2017-10-01 ENCOUNTER — Encounter: Payer: Self-pay | Admitting: Gastroenterology

## 2017-10-14 ENCOUNTER — Encounter: Payer: Self-pay | Admitting: Physician Assistant

## 2017-10-14 ENCOUNTER — Ambulatory Visit: Payer: 59 | Admitting: Physician Assistant

## 2017-10-14 VITALS — BP 108/76 | HR 72 | Temp 97.7°F | Resp 16 | Wt 144.0 lb

## 2017-10-14 DIAGNOSIS — N3090 Cystitis, unspecified without hematuria: Secondary | ICD-10-CM | POA: Diagnosis not present

## 2017-10-14 DIAGNOSIS — R300 Dysuria: Secondary | ICD-10-CM

## 2017-10-14 MED ORDER — NITROFURANTOIN MONOHYD MACRO 100 MG PO CAPS: 100.0000 mg | ORAL_CAPSULE | Freq: Two times a day (BID) | ORAL | 0 refills | Status: AC

## 2017-10-14 NOTE — Progress Notes (Signed)
[facility]  Chief Complaint  Patient presents with  . Urinary Tract Infection    Started about four days ago.    Subjective:    Patient ID: Theresa Walton, female    DOB: Apr 29, 1966, 51 y.o.   MRN: [id number]   Urinary Tract Infection: Patient complains of frequency and nausea She has had symptoms for 4 days. Patient denies back pain, stomach ache and vaginal discharge. She's being treated for chronic constipation with Amitiza and has been having frequent loose bowel movements. Patient does have a history of recurrent UTI.  Patient does not have a history of pyelonephritis or other renal issues. Patient denies vaginal discharge and denies new sexual partners. The patient denies recent travel outside of the [place].  Review of Systems  Constitutional: Negative.   Gastrointestinal: Positive for nausea. Negative for abdominal pain, blood in stool, constipation, diarrhea, heartburn, melena and vomiting.  Genitourinary: Positive for frequency. Negative for dysuria, flank pain, hematuria and urgency.       Objective:   BP 108/76 (BP Location: Left Arm, Patient Position: Sitting, Cuff Size: Normal)   Pulse 72   Temp 97.7 F (36.5 C) (Oral)   Resp 16   Wt 144 lb (65.3 kg)   BMI 26.34 kg/m   Patient Active Problem List   Diagnosis Date Noted  . History of pubovaginal sling 04/01/2017  . Essential tremor 05/15/2016  . Allergic rhinitis 06/22/2015  . B12 deficiency 06/21/2015  . H/O: hysterectomy 06/21/2015  . Hypertriglyceridemia 06/21/2015  . Hypoglycemia 06/21/2015  . Arterial blood pressure decreased 06/21/2015  . Menopausal and perimenopausal disorder 06/21/2015  . Vasovagal symptom 06/21/2015  . Vision disturbance 06/21/2015  . Insomnia 04/19/2015  . Night sweats 04/19/2015  . 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 Chronic constipation 08/31/2014  . Adult hypothyroidism 08/28/2009  . Avitaminosis D 08/28/2009  . Arthropathia  02/10/2009  . Chronic infection of sinus 02/01/2009  . Adaptation reaction 09/09/2008  . Genital herpes simplex type 1 infection 07/13/2008  . Current tobacco use 07/13/2008    Outpatient Encounter Medications as of 10/14/2017  Medication Sig Note  . cyclobenzaprine (FLEXERIL) 5 MG tablet take 1 to 2 tablets by mouth at bedtime   . DYMISTA 137-50 MCG/ACT SUSP    . Eszopiclone 3 MG TABS    . FLUoxetine (PROZAC) 20 MG capsule Take 50 mg by mouth at bedtime.  05/15/2016: Received from: External Pharmacy  . glucosamine-chondroitin 500-400 MG tablet Take 1 tablet by mouth 3 (three) times daily.   Marland Kitchen ipratropium (ATROVENT) 0.06 % nasal spray instill 2 sprays into each nostril three times a day if needed   . levocetirizine (XYZAL) 5 MG tablet Take 5 mg by mouth every evening.   Marland Kitchen levothyroxine (SYNTHROID, LEVOTHROID) 25 MCG tablet TAKE 1 TABLET BY MOUTH  DAILY BEFORE BREAKFAST   . Melatonin 5 MG TABS Take by mouth.   . meloxicam (MOBIC) 15 MG tablet take 1 tablet by mouth once daily with meals   . montelukast (SINGULAIR) 10 MG tablet Take 10 mg by mouth daily.   . valACYclovir (VALTREX) 500 MG tablet Take 1 tablet (500 mg total) by mouth 2 (two) times daily.   . methylPREDNISolone (MEDROL DOSEPAK) 4 MG TBPK tablet as directed Taper dose per instructions inside package for 6 days   . nitrofurantoin, macrocrystal-monohydrate, (MACROBID) 100 MG capsule Take 1 capsule (100 mg total) by mouth 2 (two) times daily for 5 days.   . predniSONE (DELTASONE) 5 MG tablet  prednisone 5 mg tablets in a dose pack  Take by oral route as directed for a 12 day taper    No facility-administered encounter medications on file as of 10/14/2017.     Allergies  Allergen Reactions  . Cefaclor Anaphylaxis  . Cephalosporins Anaphylaxis  . Lubiprostone     Other reaction(s): Angioedema Tongue Swelling x 2 times.  Other reaction(s): Angioedema Tongue Swelling x 2 times.   . Celecoxib   . Lac Bovis     Other  reaction(s): Other (See Comments) GI Upset to milk protein  . Pseudoephedrine Palpitations    Tachycardia       Physical Exam  Constitutional: She is oriented to person, place, and time. She appears well-developed and well-nourished. No distress.  HENT:  Right Ear: External ear normal.  Left Ear: External ear normal.  Mouth/Throat: Oropharynx is clear and moist. No oropharyngeal exudate.  Neck: Neck supple.  Cardiovascular: Normal rate and regular rhythm.  Pulmonary/Chest: Effort normal and breath sounds normal. No respiratory distress. She has no rales.  Abdominal: Soft. Bowel sounds are normal. She exhibits no distension. There is tenderness in the suprapubic area. There is no rebound, no guarding and no CVA tenderness.  Lymphadenopathy:    She has no cervical adenopathy.  Neurological: She is alert and oriented to person, place, and time.  Skin: Skin is warm and dry. She is not diaphoretic.  Psychiatric: She has a normal mood and affect. Her behavior is normal.       Assessment & Plan:  1. Cystitis  - nitrofurantoin, macrocrystal-monohydrate, (MACROBID) 100 MG capsule; Take 1 capsule (100 mg total) by mouth 2 (two) times daily for 5 days.  Dispense: 10 capsule; Refill: 0  2. Dysuria  - CULTURE, URINE COMPREHENSIVE  Return if symptoms worsen or fail to improve.  The entirety of the information documented in the History of Present Illness, Review of Systems and Physical Exam were personally obtained by me. Portions of this information were initially documented by Ashley Royalty, CMA and reviewed by me for thoroughness and accuracy.

## 2017-10-14 NOTE — Patient Instructions (Signed)

## 2017-10-15 LAB — POCT URINALYSIS DIPSTICK
Bilirubin, UA: NEGATIVE
Glucose, UA: NEGATIVE
Ketones, UA: NEGATIVE
Nitrite, UA: NEGATIVE
Protein, UA: NEGATIVE
Spec Grav, UA: 1.01 (ref 1.010–1.025)
Urobilinogen, UA: 0.2 E.U./dL
pH, UA: 8 (ref 5.0–8.0)

## 2017-10-15 NOTE — Addendum Note (Signed)
Addended by: Ashley Royalty E on: 10/15/2017 09:29 AM   Modules accepted: Orders

## 2017-10-16 ENCOUNTER — Telehealth: Payer: Self-pay

## 2017-10-16 NOTE — Telephone Encounter (Signed)
-----   Message from Trinna Post, [place] sent at 10/16/2017  4:50 PM EST ----- Urine culture grew beta hemolytic strep. First line is penicillin, but she is anaphylactically allergic to cephalosporin. How is she feeling?

## 2017-10-16 NOTE — Telephone Encounter (Signed)
LMTCB 10/16/2017  Thanks,   -Mickel Baas

## 2017-10-17 LAB — CULTURE, URINE COMPREHENSIVE

## 2017-10-17 LAB — SPECIMEN STATUS REPORT

## 2017-10-17 NOTE — Telephone Encounter (Signed)
Pt advised. She reports she is starting to feel better.   Thanks,   -Mickel Baas

## 2017-10-17 NOTE — Telephone Encounter (Signed)
Pt returning call for lab results and request a call back at work# [phone]. Please advise. Thanks TNP

## 2017-10-20 ENCOUNTER — Telehealth: Payer: Self-pay | Admitting: Family Medicine

## 2017-10-20 DIAGNOSIS — N3090 Cystitis, unspecified without hematuria: Secondary | ICD-10-CM

## 2017-10-20 MED ORDER — NITROFURANTOIN MONOHYD MACRO 100 MG PO CAPS: 100.0000 mg | ORAL_CAPSULE | Freq: Two times a day (BID) | ORAL | 0 refills | Status: DC

## 2017-10-20 NOTE — Telephone Encounter (Signed)
Pt advised on voicemail-Audra Kagel V Alynn Ellithorpe, RMA

## 2017-10-20 NOTE — Telephone Encounter (Signed)
Will treat for a few more days with macrobid.   May also be getting a yeast infection due to antibiotic usage. Would recommend trying OTC yeast infection medications such as monistat to treat if itching persist.

## 2017-10-20 NOTE — Telephone Encounter (Signed)
lmtcb to ask what symptom she is having-Theresa Walton, RMA

## 2017-10-20 NOTE — Telephone Encounter (Signed)
Spoke with patient she is having Frequency, itching (which is new). Urine is not as cloudy in the morning as it was before treatment, a little nausea with the urge to use the bathroom is present. Please review-Aveion Nguyen V Raima Geathers, RMA No other symtpoms

## 2017-10-20 NOTE — Telephone Encounter (Signed)
Pt was in with Adriana last week for a UTI.  She is finished the antibiotic but still having symptoms  She uses [facility]  [employer] is [phone]  Thanks  Con Memos

## 2017-11-23 ENCOUNTER — Other Ambulatory Visit: Payer: Self-pay | Admitting: Physician Assistant

## 2017-11-24 NOTE — Telephone Encounter (Signed)
Mail order pharmacy requesting refills. Thanks!  

## 2017-11-27 ENCOUNTER — Encounter: Payer: Self-pay | Admitting: Physician Assistant

## 2017-11-27 ENCOUNTER — Ambulatory Visit: Payer: Managed Care, Other (non HMO) | Admitting: Physician Assistant

## 2017-11-27 VITALS — BP 102/80 | HR 92 | Temp 98.0°F | Resp 16 | Wt 139.0 lb

## 2017-11-27 DIAGNOSIS — J40 Bronchitis, not specified as acute or chronic: Secondary | ICD-10-CM

## 2017-11-27 MED ORDER — PREDNISONE 10 MG (21) PO TBPK
ORAL_TABLET | ORAL | 0 refills | Status: DC
Start: 2017-11-27 — End: 2017-12-25

## 2017-11-27 NOTE — Patient Instructions (Signed)

## 2017-11-27 NOTE — Progress Notes (Signed)
Patient: Theresa Walton Female    DOB: 09-23-1966   52 y.o.   MRN: [id number] Visit Date: 11/27/2017  Today's Provider: Mar Daring, PA-C   Chief Complaint  Patient presents with  . URI   Subjective:    HPI Upper Respiratory Infection: Patient complains of symptoms of a URI, possible sinusitis. Symptoms include congestion and cough. Onset of symptoms was 5 days ago, gradually worsening since that time. She also c/o productive cough with  green colored sputum for the past 5 days .  She is drinking plenty of fluids. Evaluation to date: seen previously and thought to have a URI. Treatment to date: antibiotics and cough suppressants. Patient was seen at [facility] on 11/23/17 and started on Cheratussin AC and Zithromax 250 mg patient started this on Tuesday.     Allergies  Allergen Reactions  . Cefaclor Anaphylaxis  . Cephalosporins Anaphylaxis  . Lubiprostone     Other reaction(s): Angioedema Tongue Swelling x 2 times.  Other reaction(s): Angioedema Tongue Swelling x 2 times.   . Celecoxib   . Lac Bovis     Other reaction(s): Other (See Comments) GI Upset to milk protein  . Pseudoephedrine Palpitations    Tachycardia     Current Outpatient Medications:  .  cyclobenzaprine (FLEXERIL) 5 MG tablet, take 1 to 2 tablets by mouth at bedtime, Disp: 30 tablet, Rfl: 5 .  DYMISTA 137-50 MCG/ACT SUSP, , Disp: , Rfl: 1 .  Eszopiclone 3 MG TABS, , Disp: , Rfl: 0 .  glucosamine-chondroitin 500-400 MG tablet, Take 1 tablet by mouth 3 (three) times daily., Disp: , Rfl:  .  levocetirizine (XYZAL) 5 MG tablet, Take 5 mg by mouth every evening., Disp: , Rfl: 0 .  levothyroxine (SYNTHROID, LEVOTHROID) 25 MCG tablet, TAKE 1 TABLET BY MOUTH  DAILY BEFORE BREAKFAST, Disp: 90 tablet, Rfl: 1 .  montelukast (SINGULAIR) 10 MG tablet, Take 10 mg by mouth daily., Disp: , Rfl: 0 .  valACYclovir (VALTREX) 500 MG tablet, Take 1 tablet (500 mg total) by mouth 2 (two) times daily., Disp: 30  tablet, Rfl: 3  Review of Systems  Constitutional: Positive for fatigue. Negative for fever.  Respiratory: Positive for cough and chest tightness. Negative for shortness of breath and wheezing.   Cardiovascular: Negative for chest pain, palpitations and leg swelling.  Gastrointestinal: Positive for nausea. Negative for abdominal pain.  Neurological: Negative for dizziness and headaches.    Social History   Tobacco Use  . Smoking status: Former Smoker    Types: Cigarettes    Last attempt to quit: 05/15/2015    Years since quitting: 2.5  . Smokeless tobacco: Never Used  Substance Use Topics  . Alcohol use: No    Alcohol/week: 0.0 oz   Objective:   BP 102/80 (BP Location: Left Arm, Patient Position: Sitting, Cuff Size: Normal)   Pulse 92   Temp 98 F (36.7 C) (Oral)   Resp 16   Wt 139 lb (63 kg)   SpO2 99%   BMI 25.42 kg/m  Vitals:   11/27/17 0812  BP: 102/80  Pulse: 92  Resp: 16  Temp: 98 F (36.7 C)  TempSrc: Oral  SpO2: 99%  Weight: 139 lb (63 kg)     Physical Exam  Constitutional: She appears well-developed and well-nourished. No distress.  HENT:  Head: Normocephalic and atraumatic.  Right Ear: Hearing, tympanic membrane, external ear and ear canal normal.  Left Ear: Hearing, tympanic membrane, external ear and ear  canal normal.  Nose: Nose normal.  Mouth/Throat: Uvula is midline, oropharynx is clear and moist and mucous membranes are normal. No oropharyngeal exudate.  Eyes: Conjunctivae are normal. Pupils are equal, round, and reactive to light. Right eye exhibits no discharge. Left eye exhibits no discharge. No scleral icterus.  Neck: Normal range of motion. Neck supple. No tracheal deviation present. No thyromegaly present.  Cardiovascular: Normal rate, regular rhythm and normal heart sounds. Exam reveals no gallop and no friction rub.  No murmur heard. Pulmonary/Chest: Effort normal. No stridor. No respiratory distress. She has decreased breath sounds. She  has no wheezes. She has no rales.  Lymphadenopathy:    She has no cervical adenopathy.  Skin: Skin is warm and dry. She is not diaphoretic.  Vitals reviewed.       Assessment & Plan:     1. Bronchitis Breath sounds were tight sounding without any noticeable wheeze. Will add prednisone to zpak patient is already taking. Mucinex may be used for congestion. Call if symptoms worsen.  - predniSONE (STERAPRED UNI-PAK 21 TAB) 10 MG (21) TBPK tablet; 6 day taper; take as directed on package instructions  Dispense: 21 tablet; Refill: 0       Mar Daring, PA-C  [facility]

## 2017-11-30 ENCOUNTER — Other Ambulatory Visit: Payer: Self-pay | Admitting: Internal Medicine

## 2017-12-01 NOTE — Telephone Encounter (Signed)
Last her5/18 and next 5/19 is dsk pt

## 2017-12-02 ENCOUNTER — Other Ambulatory Visit: Payer: Self-pay

## 2017-12-02 MED ORDER — ESZOPICLONE 3 MG PO TABS: 3.0000 mg | ORAL_TABLET | Freq: Every day | ORAL | 3 refills | Status: DC

## 2017-12-09 ENCOUNTER — Telehealth: Payer: Self-pay | Admitting: Internal Medicine

## 2017-12-09 MED ORDER — ESZOPICLONE 3 MG PO TABS: 3.0000 mg | ORAL_TABLET | Freq: Every day | ORAL | 3 refills | Status: DC

## 2017-12-10 ENCOUNTER — Encounter: Payer: Self-pay | Admitting: Family Medicine

## 2017-12-25 ENCOUNTER — Ambulatory Visit (INDEPENDENT_AMBULATORY_CARE_PROVIDER_SITE_OTHER): Payer: Managed Care, Other (non HMO) | Admitting: Physician Assistant

## 2017-12-25 ENCOUNTER — Encounter: Payer: Self-pay | Admitting: Physician Assistant

## 2017-12-25 VITALS — BP 100/60 | HR 83 | Resp 16 | Wt 139.0 lb

## 2017-12-25 DIAGNOSIS — F5101 Primary insomnia: Secondary | ICD-10-CM | POA: Diagnosis not present

## 2017-12-25 DIAGNOSIS — Z1231 Encounter for screening mammogram for malignant neoplasm of breast: Secondary | ICD-10-CM | POA: Diagnosis not present

## 2017-12-25 DIAGNOSIS — E039 Hypothyroidism, unspecified: Secondary | ICD-10-CM | POA: Diagnosis not present

## 2017-12-25 DIAGNOSIS — E781 Pure hyperglyceridemia: Secondary | ICD-10-CM

## 2017-12-25 DIAGNOSIS — Z6825 Body mass index (BMI) 25.0-25.9, adult: Secondary | ICD-10-CM | POA: Diagnosis not present

## 2017-12-25 DIAGNOSIS — K5909 Other constipation: Secondary | ICD-10-CM | POA: Diagnosis not present

## 2017-12-25 DIAGNOSIS — Z0000 Encounter for general adult medical examination without abnormal findings: Secondary | ICD-10-CM

## 2017-12-25 DIAGNOSIS — Z90710 Acquired absence of both cervix and uterus: Secondary | ICD-10-CM

## 2017-12-25 DIAGNOSIS — Z1239 Encounter for other screening for malignant neoplasm of breast: Secondary | ICD-10-CM

## 2017-12-25 NOTE — Patient Instructions (Signed)
Health Maintenance for Postmenopausal Women Menopause is a normal process in which your reproductive ability comes to an end. This process happens gradually over a span of months to years, usually between the ages of 22 and 9. Menopause is complete when you have missed 12 consecutive menstrual periods. It is important to talk with your health care provider about some of the most common conditions that affect postmenopausal women, such as heart disease, cancer, and bone loss (osteoporosis). Adopting a healthy lifestyle and getting preventive care can help to promote your health and wellness. Those actions can also lower your chances of developing some of these common conditions. What should I know about menopause? During menopause, you may experience a number of symptoms, such as:  Moderate-to-severe hot flashes.  Night sweats.  Decrease in sex drive.  Mood swings.  Headaches.  Tiredness.  Irritability.  Memory problems.  Insomnia.  Choosing to treat or not to treat menopausal changes is an individual decision that you make with your health care provider. What should I know about hormone replacement therapy and supplements? Hormone therapy products are effective for treating symptoms that are associated with menopause, such as hot flashes and night sweats. Hormone replacement carries certain risks, especially as you become older. If you are thinking about using estrogen or estrogen with progestin treatments, discuss the benefits and risks with your health care provider. What should I know about heart disease and stroke? Heart disease, heart attack, and stroke become more likely as you age. This may be due, in part, to the hormonal changes that your body experiences during menopause. These can affect how your body processes dietary fats, triglycerides, and cholesterol. Heart attack and stroke are both medical emergencies. There are many things that you can do to help prevent heart disease  and stroke:  Have your blood pressure checked at least every 1-2 years. High blood pressure causes heart disease and increases the risk of stroke.  If you are 53-22 years old, ask your health care provider if you should take aspirin to prevent a heart attack or a stroke.  Do not use any tobacco products, including cigarettes, chewing tobacco, or electronic cigarettes. If you need help quitting, ask your health care provider.  It is important to eat a healthy diet and maintain a healthy weight. ? Be sure to include plenty of vegetables, fruits, low-fat dairy products, and lean protein. ? Avoid eating foods that are high in solid fats, added sugars, or salt (sodium).  Get regular exercise. This is one of the most important things that you can do for your health. ? Try to exercise for at least 150 minutes each week. The type of exercise that you do should increase your heart rate and make you sweat. This is known as moderate-intensity exercise. ? Try to do strengthening exercises at least twice each week. Do these in addition to the moderate-intensity exercise.  Know your numbers.Ask your health care provider to check your cholesterol and your blood glucose. Continue to have your blood tested as directed by your health care provider.  What should I know about cancer screening? There are several types of cancer. Take the following steps to reduce your risk and to catch any cancer development as early as possible. Breast Cancer  Practice breast self-awareness. ? This means understanding how your breasts normally appear and feel. ? It also means doing regular breast self-exams. Let your health care provider know about any changes, no matter how small.  If you are 40  or older, have a clinician do a breast exam (clinical breast exam or CBE) every year. Depending on your age, family history, and medical history, it may be recommended that you also have a yearly breast X-ray (mammogram).  If you  have a family history of breast cancer, talk with your health care provider about genetic screening.  If you are at high risk for breast cancer, talk with your health care provider about having an MRI and a mammogram every year.  Breast cancer (BRCA) gene test is recommended for women who have family members with BRCA-related cancers. Results of the assessment will determine the need for genetic counseling and BRCA1 and for BRCA2 testing. BRCA-related cancers include these types: ? Breast. This occurs in males or females. ? Ovarian. ? Tubal. This may also be called fallopian tube cancer. ? Cancer of the abdominal or pelvic lining (peritoneal cancer). ? Prostate. ? Pancreatic.  Cervical, Uterine, and Ovarian Cancer Your health care provider may recommend that you be screened regularly for cancer of the pelvic organs. These include your ovaries, uterus, and vagina. This screening involves a pelvic exam, which includes checking for microscopic changes to the surface of your cervix (Pap test).  For women ages 21-65, health care providers may recommend a pelvic exam and a Pap test every three years. For women ages 79-65, they may recommend the Pap test and pelvic exam, combined with testing for human papilloma virus (HPV), every five years. Some types of HPV increase your risk of cervical cancer. Testing for HPV may also be done on women of any age who have unclear Pap test results.  Other health care providers may not recommend any screening for nonpregnant women who are considered low risk for pelvic cancer and have no symptoms. Ask your health care provider if a screening pelvic exam is right for you.  If you have had past treatment for cervical cancer or a condition that could lead to cancer, you need Pap tests and screening for cancer for at least 20 years after your treatment. If Pap tests have been discontinued for you, your risk factors (such as having a new sexual partner) need to be  reassessed to determine if you should start having screenings again. Some women have medical problems that increase the chance of getting cervical cancer. In these cases, your health care provider may recommend that you have screening and Pap tests more often.  If you have a family history of uterine cancer or ovarian cancer, talk with your health care provider about genetic screening.  If you have vaginal bleeding after reaching menopause, tell your health care provider.  There are currently no reliable tests available to screen for ovarian cancer.  Lung Cancer Lung cancer screening is recommended for adults 69-62 years old who are at high risk for lung cancer because of a history of smoking. A yearly low-dose CT scan of the lungs is recommended if you:  Currently smoke.  Have a history of at least 30 pack-years of smoking and you currently smoke or have quit within the past 15 years. A pack-year is smoking an average of one pack of cigarettes per day for one year.  Yearly screening should:  Continue until it has been 15 years since you quit.  Stop if you develop a health problem that would prevent you from having lung cancer treatment.  Colorectal Cancer  This type of cancer can be detected and can often be prevented.  Routine colorectal cancer screening usually begins at  age 42 and continues through age 45.  If you have risk factors for colon cancer, your health care provider may recommend that you be screened at an earlier age.  If you have a family history of colorectal cancer, talk with your health care provider about genetic screening.  Your health care provider may also recommend using home test kits to check for hidden blood in your stool.  A small camera at the end of a tube can be used to examine your colon directly (sigmoidoscopy or colonoscopy). This is done to check for the earliest forms of colorectal cancer.  Direct examination of the colon should be repeated every  5-10 years until age 71. However, if early forms of precancerous polyps or small growths are found or if you have a family history or genetic risk for colorectal cancer, you may need to be screened more often.  Skin Cancer  Check your skin from head to toe regularly.  Monitor any moles. Be sure to tell your health care provider: ? About any new moles or changes in moles, especially if there is a change in a mole's shape or color. ? If you have a mole that is larger than the size of a pencil eraser.  If any of your family members has a history of skin cancer, especially at a young age, talk with your health care provider about genetic screening.  Always use sunscreen. Apply sunscreen liberally and repeatedly throughout the day.  Whenever you are outside, protect yourself by wearing long sleeves, pants, a wide-brimmed hat, and sunglasses.  What should I know about osteoporosis? Osteoporosis is a condition in which bone destruction happens more quickly than new bone creation. After menopause, you may be at an increased risk for osteoporosis. To help prevent osteoporosis or the bone fractures that can happen because of osteoporosis, the following is recommended:  If you are 46-71 years old, get at least 1,000 mg of calcium and at least 600 mg of vitamin D per day.  If you are older than age 55 but younger than age 65, get at least 1,200 mg of calcium and at least 600 mg of vitamin D per day.  If you are older than age 54, get at least 1,200 mg of calcium and at least 800 mg of vitamin D per day.  Smoking and excessive alcohol intake increase the risk of osteoporosis. Eat foods that are rich in calcium and vitamin D, and do weight-bearing exercises several times each week as directed by your health care provider. What should I know about how menopause affects my mental health? Depression may occur at any age, but it is more common as you become older. Common symptoms of depression  include:  Low or sad mood.  Changes in sleep patterns.  Changes in appetite or eating patterns.  Feeling an overall lack of motivation or enjoyment of activities that you previously enjoyed.  Frequent crying spells.  Talk with your health care provider if you think that you are experiencing depression. What should I know about immunizations? It is important that you get and maintain your immunizations. These include:  Tetanus, diphtheria, and pertussis (Tdap) booster vaccine.  Influenza every year before the flu season begins.  Pneumonia vaccine.  Shingles vaccine.  Your health care provider may also recommend other immunizations. This information is not intended to replace advice given to you by your health care provider. Make sure you discuss any questions you have with your health care provider. Document Released: 12/06/2005  Document Revised: 05/03/2016 Document Reviewed: 07/18/2015 Elsevier Interactive Patient Education  2018 [employer]  

## 2017-12-25 NOTE — Progress Notes (Signed)
Patient: Theresa Walton, Female    DOB: [birth year], 52 y.o.   MRN: [id number] Visit Date: 12/25/2017  Today's Provider: Mar Daring, PA-C   No chief complaint on file.  Subjective:    Annual physical exam Theresa Walton is a 52 y.o. female who presents today for health maintenance and complete physical. She feels well. She reports exercising some-PT for an hour. She reports she is sleeping fairly well.  CPE:12/19/2016 Mammogram:02/11/17 BI-RADS 1 Colonoscopy: 08/29/17 Repeat in 10 yrs for screening purposes. -----------------------------------------------------------------   Review of Systems  Constitutional: Negative.   HENT: Positive for congestion and sinus pressure.   Eyes: Negative.   Respiratory: Negative.   Cardiovascular: Negative.   Gastrointestinal: Negative.   Endocrine: Negative.   Genitourinary: Positive for frequency.  Musculoskeletal: Positive for arthralgias ("torn rotator cuff right shoulder-PT).  Skin: Negative.   Allergic/Immunologic: Negative.   Neurological: Positive for tremors.  Hematological: Negative.   Psychiatric/Behavioral: Negative.     Social History      She  reports that she quit smoking about 2 years ago. Her smoking use included cigarettes. she has never used smokeless tobacco. She reports that she does not drink alcohol or use drugs.       Social History   Socioeconomic History  . Marital status: Divorced    Spouse name: None  . Number of children: None  . Years of education: None  . Highest education level: None  Social Needs  . Financial resource strain: None  . Food insecurity - worry: None  . Food insecurity - inability: None  . Transportation needs - medical: None  . Transportation needs - non-medical: None  Occupational History  . None  Tobacco Use  . Smoking status: Former Smoker    Types: Cigarettes    Last attempt to quit: 05/15/2015    Years since quitting: 2.6  . Smokeless tobacco: Never Used    Substance and Sexual Activity  . Alcohol use: No    Alcohol/week: 0.0 oz  . Drug use: No  . Sexual activity: No    Partners: Male    Birth control/protection: Surgical  Other Topics Concern  . None  Social History Narrative  . None    Past Medical History:  Diagnosis Date  . 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 Genital herpes   . Hypothyroidism   . Seasonal allergies   . Syncope and collapse   . Thyroid disease    hypothyroidism     Patient Active Problem List   Diagnosis Date Noted  . Low back pain 09/16/2017  . Neck pain 09/16/2017  . History of pubovaginal sling 04/01/2017  . Essential tremor 05/15/2016  . Allergic rhinitis 06/22/2015  . B12 deficiency 06/21/2015  . H/O: hysterectomy 06/21/2015  . Hypertriglyceridemia 06/21/2015  . Hypoglycemia 06/21/2015  . Arterial blood pressure decreased 06/21/2015  . Menopausal and perimenopausal disorder 06/21/2015  . Vasovagal symptom 06/21/2015  . Vision disturbance 06/21/2015  . Insomnia 04/19/2015  . Night sweats 04/19/2015  . 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 Chronic constipation 08/31/2014  . Adult hypothyroidism 08/28/2009  . Avitaminosis D 08/28/2009  . Arthropathia 02/10/2009  . Chronic infection of sinus 02/01/2009  . Adaptation reaction 09/09/2008  . Genital herpes simplex type 1 infection 07/13/2008  . Current tobacco use 07/13/2008    Past Surgical History:  Procedure Laterality Date  . ABDOMINAL HYSTERECTOMY    . ANTERIOR CRUCIATE LIGAMENT REPAIR Right 2012  . ARTHROSCOPIC REPAIR ACL  2013  Right knee  . AUGMENTATION MAMMAPLASTY Bilateral 2000   saline  . BLADDER REPAIR    . BREAST ENHANCEMENT SURGERY  1995  . COLONOSCOPY WITH PROPOFOL N/A 08/29/2017   Procedure: COLONOSCOPY WITH PROPOFOL;  Surgeon: Jonathon Bellows, MD;  Location: [facility];  Service: Gastroenterology;  Laterality: N/A;  . CYSTOCELE REPAIR  2013  . ENDOMETRIAL ABLATION  2013  . [place], 2001  .  PARTIAL HYSTERECTOMY  2015  . PARTIAL HYSTERECTOMY Bilateral 2014  . RECTOPEXY  2011  . TONSILECTOMY, ADENOIDECTOMY, BILATERAL MYRINGOTOMY AND TUBES  1984    Family History        Family Status  Relation Name Status  . Mother  Deceased at age 54[birth year]01-18  . Sister 1 Alive  . Brother 1 Alive  . Sister 2 Alive  . PGM  (Not Specified)  . Father  Alive  . Neg Hx  (Not Specified)        Her family history includes Arrhythmia in her father; Healthy in her brother, sister, and sister; Heart disease in her father; Lung cancer in her paternal grandmother; Pulmonary fibrosis in her mother. There is no history of Prostate cancer, Bladder Cancer, or Kidney cancer.      Allergies  Allergen Reactions  . Cefaclor Anaphylaxis  . Cephalosporins Anaphylaxis  . Lubiprostone     Other reaction(s): Angioedema Tongue Swelling x 2 times.  Other reaction(s): Angioedema Tongue Swelling x 2 times.   . Celecoxib   . Lac Bovis     Other reaction(s): Other (See Comments) GI Upset to milk protein  . Pseudoephedrine Palpitations    Tachycardia     Current Outpatient Medications:  .  cyclobenzaprine (FLEXERIL) 5 MG tablet, take 1 to 2 tablets by mouth at bedtime, Disp: 30 tablet, Rfl: 5 .  DYMISTA 137-50 MCG/ACT SUSP, , Disp: , Rfl: 1 .  Eszopiclone 3 MG TABS, Take 1 tablet (3 mg total) by mouth at bedtime. Take immediately before bedtime, Disp: 30 tablet, Rfl: 3 .  glucosamine-chondroitin 500-400 MG tablet, Take 1 tablet by mouth 3 (three) times daily., Disp: , Rfl:  .  levocetirizine (XYZAL) 5 MG tablet, Take 5 mg by mouth every evening., Disp: , Rfl: 0 .  levothyroxine (SYNTHROID, LEVOTHROID) 25 MCG tablet, TAKE 1 TABLET BY MOUTH  DAILY BEFORE BREAKFAST, Disp: 90 tablet, Rfl: 1 .  montelukast (SINGULAIR) 10 MG tablet, Take 10 mg by mouth daily., Disp: , Rfl: 0 .  predniSONE (STERAPRED UNI-PAK 21 TAB) 10 MG (21) TBPK tablet, 6 day taper; take as directed on package instructions, Disp: 21 tablet, Rfl:  0 .  valACYclovir (VALTREX) 500 MG tablet, Take 1 tablet (500 mg total) by mouth 2 (two) times daily., Disp: 30 tablet, Rfl: 3   Patient Care Team: Mar Daring, PA-C as PCP - General (Family Medicine)      Objective:   Vitals: BP 100/60 (BP Location: Left Arm, Patient Position: Sitting, Cuff Size: Normal)   Pulse 83   Resp 16   Wt 139 lb (63 kg)   BMI 25.42 kg/m     Physical Exam  Constitutional: She is oriented to person, place, and time. She appears well-developed and well-nourished. No distress.  HENT:  Head: Normocephalic and atraumatic.  Right Ear: Hearing, tympanic membrane, external ear and ear canal normal.  Left Ear: Hearing, tympanic membrane, external ear and ear canal normal.  Nose: Nose normal.  Mouth/Throat: Uvula is midline, oropharynx is clear and moist and mucous  membranes are normal. No oropharyngeal exudate.  Eyes: Conjunctivae and EOM are normal. Pupils are equal, round, and reactive to light. Right eye exhibits no discharge. Left eye exhibits no discharge. No scleral icterus.  Neck: Normal range of motion. Neck supple. No JVD present. Carotid bruit is not present. No tracheal deviation present. No thyromegaly present.  Cardiovascular: Normal rate, regular rhythm, normal heart sounds and intact distal pulses. Exam reveals no gallop and no friction rub.  No murmur heard. Pulmonary/Chest: Effort normal and breath sounds normal. No respiratory distress. She has no wheezes. She has no rales. She exhibits no tenderness. Right breast exhibits no inverted nipple, no mass, no nipple discharge, no skin change and no tenderness. Left breast exhibits no inverted nipple, no mass, no nipple discharge, no skin change and no tenderness. Breasts are symmetrical.  Abdominal: Soft. Bowel sounds are normal. She exhibits no distension and no mass. There is no tenderness. There is no rebound and no guarding.  Musculoskeletal: Normal range of motion. She exhibits no edema or  tenderness.  Lymphadenopathy:    She has no cervical adenopathy.  Neurological: She is alert and oriented to person, place, and time. She has normal reflexes.  Skin: Skin is warm and dry. No rash noted. She is not diaphoretic.  Psychiatric: She has a normal mood and affect. Her behavior is normal. Judgment and thought content normal.  Vitals reviewed.    Depression Screen PHQ 2/9 Scores 12/25/2017 08/13/2017 01/22/2016  PHQ - 2 Score 0 0 0      Assessment & Plan:     Routine Health Maintenance and Physical Exam  Exercise Activities and Dietary recommendations Goals    None      Immunization History  Administered Date(s) Administered  . Influenza,inj,Quad PF,6+ Mos 08/02/2014, 08/12/2016  . Influenza,inj,quad, With Preservative 08/24/2016  . Influenza-Unspecified 08/29/2015, 08/20/2017  . Tdap 08/12/2016    Health Maintenance  Topic Date Due  . PAP SMEAR  09/15/1985  . MAMMOGRAM  02/11/2018  . TETANUS/TDAP  08/12/2026  . COLONOSCOPY  08/30/2027  . INFLUENZA VACCINE  Completed  . Hepatitis C Screening  Completed  . HIV Screening  Completed     Discussed health benefits of physical activity, and encouraged her to engage in regular exercise appropriate for her age and condition.    1. Annual physical exam Normal physical exam today. Will check labs as below and f/u pending lab results. If labs are stable and WNL she will not need to have these rechecked for one year at her next annual physical exam. She is to call the office in the meantime if she has any acute issue, questions or concerns. - CBC with Differential/Platelet - Comprehensive metabolic panel - Hemoglobin A1c - Lipid panel - Thyroid Panel With TSH  2. Breast cancer screening Breast exam today was normal. There is no family history of breast cancer. She does perform regular self breast exams. Mammogram was ordered as below. Information for [facility] clinic was given to patient so she may schedule  her mammogram at her convenience. - MM DIGITAL SCREENING BILATERAL; Future  3. Adult hypothyroidism Continue levothyroxine 41mcg. Will check labs as below and f/u pending results. - Thyroid Panel With TSH  4. Chronic constipation Stable on probiotic.   5. Primary insomnia Improving with Lunesta 3mg .   6. H/O: hysterectomy No vaginal issues.  7. Hypertriglyceridemia Will check labs as below and f/u pending results. - Lipid panel  8. BMI 25.0-25.9,adult Counseled patient on healthy lifestyle modifications  including dieting and exercise.   --------------------------------------------------------------------    Mar Daring, PA-C  [facility]

## 2017-12-26 ENCOUNTER — Telehealth: Payer: Self-pay

## 2017-12-26 LAB — HEMOGLOBIN A1C
Est. average glucose Bld gHb Est-mCnc: 105 mg/dL
Hgb A1c MFr Bld: 5.3 % (ref 4.8–5.6)

## 2017-12-26 LAB — COMPREHENSIVE METABOLIC PANEL
ALT: 23 IU/L (ref 0–32)
AST: 18 IU/L (ref 0–40)
Albumin/Globulin Ratio: 2.3 — ABNORMAL HIGH (ref 1.2–2.2)
Albumin: 4.6 g/dL (ref 3.5–5.5)
Alkaline Phosphatase: 59 IU/L (ref 39–117)
BUN/Creatinine Ratio: 10 (ref 9–23)
BUN: 6 mg/dL (ref 6–24)
Bilirubin Total: 0.2 mg/dL (ref 0.0–1.2)
CO2: 24 mmol/L (ref 20–29)
Calcium: 9.6 mg/dL (ref 8.7–10.2)
Chloride: 102 mmol/L (ref 96–106)
Creatinine, Ser: 0.63 mg/dL (ref 0.57–1.00)
GFR calc Af Amer: 118 mL/min/{1.73_m2} (ref >59)
GFR calc non Af Amer: 103 mL/min/{1.73_m2} (ref >59)
Globulin, Total: 2 g/dL (ref 1.5–4.5)
Glucose: 78 mg/dL (ref 65–99)
Potassium: 4.2 mmol/L (ref 3.5–5.2)
Sodium: 141 mmol/L (ref 134–144)
Total Protein: 6.6 g/dL (ref 6.0–8.5)

## 2017-12-26 LAB — CBC WITH DIFFERENTIAL/PLATELET
Basophils Absolute: 0 10*3/uL (ref 0.0–0.2)
Basos: 0 %
EOS (ABSOLUTE): 0.1 10*3/uL (ref 0.0–0.4)
Eos: 1 %
Hematocrit: 39 % (ref 34.0–46.6)
Hemoglobin: 12.7 g/dL (ref 11.1–15.9)
Immature Grans (Abs): 0 10*3/uL (ref 0.0–0.1)
Immature Granulocytes: 0 %
Lymphocytes Absolute: 2.2 10*3/uL (ref 0.7–3.1)
Lymphs: 33 %
MCH: 29.3 pg (ref 26.6–33.0)
MCHC: 32.6 g/dL (ref 31.5–35.7)
MCV: 90 fL (ref 79–97)
Monocytes Absolute: 0.7 10*3/uL (ref 0.1–0.9)
Monocytes: 10 %
Neutrophils Absolute: 3.6 10*3/uL (ref 1.4–7.0)
Neutrophils: 56 %
Platelets: 271 10*3/uL (ref 150–379)
RBC: 4.34 x10E6/uL (ref 3.77–5.28)
RDW: 13.9 % (ref 12.3–15.4)
WBC: 6.5 10*3/uL (ref 3.4–10.8)

## 2017-12-26 LAB — LIPID PANEL
Chol/HDL Ratio: 2.9 ratio (ref 0.0–4.4)
Cholesterol, Total: 184 mg/dL (ref 100–199)
HDL: 63 mg/dL (ref >39)
LDL Calculated: 101 mg/dL — ABNORMAL HIGH (ref 0–99)
Triglycerides: 99 mg/dL (ref 0–149)
VLDL Cholesterol Cal: 20 mg/dL (ref 5–40)

## 2017-12-26 LAB — THYROID PANEL WITH TSH
Free Thyroxine Index: 1.7 (ref 1.2–4.9)
T3 Uptake Ratio: 25 % (ref 24–39)
T4, Total: 6.7 ug/dL (ref 4.5–12.0)
TSH: 2.77 u[IU]/mL (ref 0.450–4.500)

## 2017-12-26 NOTE — Telephone Encounter (Signed)
-----   Message from Mar Daring, PA-C sent at 12/26/2017  9:27 AM EST ----- All labs are within normal limits and stable.  Thanks! -JB

## 2017-12-26 NOTE — Telephone Encounter (Signed)
LMTCB and that message and labs results are visible to her through mychart.  Thanks,  -Delois Tolbert

## 2018-01-28 ENCOUNTER — Other Ambulatory Visit: Payer: Self-pay | Admitting: Physician Assistant

## 2018-01-28 DIAGNOSIS — M62838 Other muscle spasm: Secondary | ICD-10-CM

## 2018-02-06 ENCOUNTER — Ambulatory Visit: Payer: Managed Care, Other (non HMO) | Admitting: Physician Assistant

## 2018-02-06 ENCOUNTER — Encounter: Payer: Self-pay | Admitting: Physician Assistant

## 2018-02-06 VITALS — BP 102/60 | HR 80 | Temp 98.2°F | Resp 16 | Wt 138.0 lb

## 2018-02-06 DIAGNOSIS — M25511 Pain in right shoulder: Secondary | ICD-10-CM

## 2018-02-06 DIAGNOSIS — R350 Frequency of micturition: Secondary | ICD-10-CM | POA: Diagnosis not present

## 2018-02-06 DIAGNOSIS — J0140 Acute pansinusitis, unspecified: Secondary | ICD-10-CM | POA: Diagnosis not present

## 2018-02-06 DIAGNOSIS — B379 Candidiasis, unspecified: Secondary | ICD-10-CM

## 2018-02-06 DIAGNOSIS — T3695XA Adverse effect of unspecified systemic antibiotic, initial encounter: Secondary | ICD-10-CM | POA: Diagnosis not present

## 2018-02-06 LAB — POCT URINALYSIS DIPSTICK
Appearance: NORMAL
Bilirubin, UA: NEGATIVE
Blood, UA: NEGATIVE
Glucose, UA: NEGATIVE
Ketones, UA: NEGATIVE
Leukocytes, UA: NEGATIVE
Nitrite, UA: NEGATIVE
Protein, UA: NEGATIVE
Spec Grav, UA: 1.01 (ref 1.010–1.025)
Urobilinogen, UA: 0.2 E.U./dL
pH, UA: 6.5 (ref 5.0–8.0)

## 2018-02-06 MED ORDER — GABAPENTIN 100 MG PO CAPS
100.0000 mg | ORAL_CAPSULE | Freq: Every day | ORAL | 0 refills | Status: DC
Start: 2018-02-06 — End: 2018-05-12

## 2018-02-06 MED ORDER — DOXYCYCLINE HYCLATE 100 MG PO TABS: 100.0000 mg | ORAL_TABLET | Freq: Two times a day (BID) | ORAL | 0 refills | Status: DC

## 2018-02-06 MED ORDER — FLUCONAZOLE 150 MG PO TABS: 150.0000 mg | ORAL_TABLET | Freq: Once | ORAL | 0 refills | Status: AC

## 2018-02-06 NOTE — Progress Notes (Signed)
Patient: Theresa Walton Female    DOB: [birth year]   52 y.o.   MRN: [id number] Visit Date: 02/06/2018  Today's Provider: Mar Daring, PA-C   Chief Complaint  Patient presents with  . Urinary Tract Infection  . Sinusitis   Subjective:    Urinary Tract Infection   This is a new problem. The current episode started 1 to 4 weeks ago. The problem has been gradually worsening. The patient is experiencing no pain. There has been no fever. She is sexually active. There is no history of pyelonephritis. Associated symptoms include chills, frequency and urgency. Pertinent negatives include no discharge or hematuria. Associated symptoms comments: Odor and itching. She has tried nothing for the symptoms. Her past medical history is significant for recurrent UTIs.  Sinusitis  Associated symptoms include chills, congestion, headaches ("Frontal" "Sinus"), sinus pressure and a sore throat ("scratchy"). Pertinent negatives include no coughing, ear pain, shortness of breath or sneezing. Past treatments include saline sprays (antihistamines,nasal spray9Dymista" and flush her sinus). The treatment provided no relief.      Allergies  Allergen Reactions  . Cefaclor Anaphylaxis  . Cephalosporins Anaphylaxis  . Lubiprostone     Other reaction(s): Angioedema Tongue Swelling x 2 times.  Other reaction(s): Angioedema Tongue Swelling x 2 times.   . Celecoxib   . Lac Bovis     Other reaction(s): Other (See Comments) GI Upset to milk protein  . Pseudoephedrine Palpitations    Tachycardia     Current Outpatient Medications:  .  cyclobenzaprine (FLEXERIL) 5 MG tablet, TAKE 1 TO 2 TABLETS BY MOUTH AT BEDTIME, Disp: 30 tablet, Rfl: 5 .  DYMISTA 137-50 MCG/ACT SUSP, , Disp: , Rfl: 1 .  Eszopiclone 3 MG TABS, Take 1 tablet (3 mg total) by mouth at bedtime. Take immediately before bedtime, Disp: 30 tablet, Rfl: 3 .  glucosamine-chondroitin 500-400 MG tablet, Take 1 tablet by mouth 3 (three) times  daily., Disp: , Rfl:  .  levocetirizine (XYZAL) 5 MG tablet, Take 5 mg by mouth every evening., Disp: , Rfl: 0 .  levothyroxine (SYNTHROID, LEVOTHROID) 25 MCG tablet, TAKE 1 TABLET BY MOUTH  DAILY BEFORE BREAKFAST, Disp: 90 tablet, Rfl: 1 .  montelukast (SINGULAIR) 10 MG tablet, Take 10 mg by mouth daily., Disp: , Rfl: 0 .  valACYclovir (VALTREX) 500 MG tablet, Take 1 tablet (500 mg total) by mouth 2 (two) times daily., Disp: 30 tablet, Rfl: 3  Review of Systems  Constitutional: Positive for chills. Negative for fever.  HENT: Positive for congestion, postnasal drip, sinus pressure, sinus pain, sore throat ("scratchy") and tinnitus. Negative for ear pain, rhinorrhea and sneezing.   Respiratory: Negative for cough, chest tightness, shortness of breath and wheezing.   Cardiovascular: Negative for chest pain, palpitations and leg swelling.  Genitourinary: Positive for frequency and urgency. Negative for dysuria, hematuria, pelvic pain, vaginal bleeding, vaginal discharge and vaginal pain.  Musculoskeletal: Positive for arthralgias.  Neurological: Positive for headaches ("Frontal" "Sinus"). Negative for dizziness and light-headedness.    Social History   Tobacco Use  . Smoking status: Former Smoker    Types: Cigarettes    Last attempt to quit: 05/15/2015    Years since quitting: 2.7  . Smokeless tobacco: Never Used  Substance Use Topics  . Alcohol use: No    Alcohol/week: 0.0 oz   Objective:   BP 102/60 (BP Location: Left Arm, Patient Position: Sitting, Cuff Size: Normal)   Pulse 80   Temp 98.2 F (36.8 C) (  Oral)   Resp 16   Wt 138 lb (62.6 kg)   SpO2 98%   BMI 25.24 kg/m     Physical Exam  Constitutional: She appears well-developed and well-nourished. No distress.  HENT:  Head: Normocephalic and atraumatic.  Right Ear: Hearing, tympanic membrane, external ear and ear canal normal.  Left Ear: Hearing, tympanic membrane, external ear and ear canal normal.  Nose: Right sinus  exhibits maxillary sinus tenderness and frontal sinus tenderness. Left sinus exhibits maxillary sinus tenderness and frontal sinus tenderness.  Mouth/Throat: Uvula is midline, oropharynx is clear and moist and mucous membranes are normal. No oropharyngeal exudate.  Neck: Normal range of motion. Neck supple. No tracheal deviation present. No thyromegaly present.  Cardiovascular: Normal rate, regular rhythm and normal heart sounds. Exam reveals no gallop and no friction rub.  No murmur heard. Pulmonary/Chest: Effort normal and breath sounds normal. No stridor. No respiratory distress. She has no wheezes. She has no rales.  Abdominal: Soft. Bowel sounds are normal. There is no tenderness. There is no CVA tenderness.  Lymphadenopathy:    She has no cervical adenopathy.  Skin: She is not diaphoretic.  Vitals reviewed.      Assessment & Plan:     1. Frequency of urination UA normal. Will await culture results.  - POCT urinalysis dipstick - Urine Culture  2. Antibiotic-induced yeast infection Suspect yeast infection since having itching and new sexual partner. Diflucan sent as below.  - fluconazole (DIFLUCAN) 150 MG tablet; Take 1 tablet (150 mg total) by mouth once for 1 dose. May take 2nd tab 48-72 hrs after 1st if needed  Dispense: 2 tablet; Refill: 0  3. Acute pansinusitis, recurrence not specified Worsening symptoms that have not responded to OTC medications. Will give Doxycycline as below. Continue allergy medications. Stay well hydrated and get plenty of rest. Call if no symptom improvement or if symptoms worsen. - doxycycline (VIBRA-TABS) 100 MG tablet; Take 1 tablet (100 mg total) by mouth 2 (two) times daily.  Dispense: 20 tablet; Refill: 0  4. Acute pain of right shoulder Tore rotator cuff last fall. Completing PT and has improved pain, ROM and strength. However having worsening nerve type pain of right shoulder. Will try gabapentin as below.  - gabapentin (NEURONTIN) 100 MG  capsule; Take 1 capsule (100 mg total) by mouth at bedtime.  Dispense: 90 capsule; Refill: 0       Mar Daring, PA-C  [facility]

## 2018-02-08 ENCOUNTER — Encounter: Payer: Self-pay | Admitting: Physician Assistant

## 2018-02-08 LAB — URINE CULTURE

## 2018-02-09 ENCOUNTER — Telehealth: Payer: Self-pay

## 2018-02-09 NOTE — Telephone Encounter (Signed)
LMTCB patient also has mychart active.  Thanks,  -Bailyn Spackman

## 2018-02-09 NOTE — Telephone Encounter (Signed)
-----   Message from Mar Daring, PA-C sent at 02/09/2018 10:04 AM EDT ----- Urine culture positive for Strep B. Should be susceptible to doxycycline. Continue current treatment and call if symptoms do not improve or if they return.

## 2018-02-09 NOTE — Telephone Encounter (Signed)
Viewed by [name] on 02/09/2018 1:39 PM

## 2018-02-12 ENCOUNTER — Ambulatory Visit
Admission: RE | Admit: 2018-02-12 | Discharge: 2018-02-12 | Disposition: A | Discharge status: Home / Self Care | Payer: Managed Care, Other (non HMO) | Source: Ambulatory Visit | Attending: Physician Assistant | Admitting: Physician Assistant

## 2018-02-12 DIAGNOSIS — Z1231 Encounter for screening mammogram for malignant neoplasm of breast: Secondary | ICD-10-CM | POA: Insufficient documentation

## 2018-02-12 DIAGNOSIS — Z1239 Encounter for other screening for malignant neoplasm of breast: Secondary | ICD-10-CM

## 2018-02-18 ENCOUNTER — Encounter: Payer: Self-pay | Admitting: Physician Assistant

## 2018-02-18 DIAGNOSIS — N951 Menopausal and female climacteric states: Secondary | ICD-10-CM

## 2018-02-18 DIAGNOSIS — J069 Acute upper respiratory infection, unspecified: Secondary | ICD-10-CM

## 2018-02-18 MED ORDER — PREDNISONE 10 MG (21) PO TBPK: ORAL_TABLET | ORAL | 0 refills | Status: DC

## 2018-02-19 ENCOUNTER — Encounter: Payer: Self-pay | Admitting: Physician Assistant

## 2018-02-19 ENCOUNTER — Ambulatory Visit: Payer: Managed Care, Other (non HMO) | Admitting: Physician Assistant

## 2018-02-19 ENCOUNTER — Other Ambulatory Visit: Payer: Self-pay

## 2018-02-19 VITALS — BP 110/78 | HR 58 | Temp 98.6°F | Wt 135.0 lb

## 2018-02-19 DIAGNOSIS — R232 Flushing: Secondary | ICD-10-CM

## 2018-02-19 DIAGNOSIS — R300 Dysuria: Secondary | ICD-10-CM

## 2018-02-19 DIAGNOSIS — Z7251 High risk heterosexual behavior: Secondary | ICD-10-CM

## 2018-02-19 DIAGNOSIS — R5383 Other fatigue: Secondary | ICD-10-CM | POA: Diagnosis not present

## 2018-02-19 DIAGNOSIS — R4586 Emotional lability: Secondary | ICD-10-CM

## 2018-02-19 LAB — POCT URINALYSIS DIPSTICK
Bilirubin, UA: NEGATIVE
Blood, UA: NEGATIVE
Glucose, UA: NEGATIVE
Ketones, UA: NEGATIVE
Leukocytes, UA: NEGATIVE
Nitrite, UA: NEGATIVE
Protein, UA: NEGATIVE
Spec Grav, UA: 1.01 (ref 1.010–1.025)
Urobilinogen, UA: 0.2 E.U./dL
pH, UA: 6 (ref 5.0–8.0)

## 2018-02-19 NOTE — Telephone Encounter (Signed)
Patient has an appt today at 1:20.

## 2018-02-19 NOTE — Progress Notes (Signed)
Patient: Theresa Walton Female    DOB: 06/10/1966   52 y.o.   MRN: [id number] Visit Date: 02/19/2018  Today's Provider: Mar Daring, PA-C   Chief Complaint  Patient presents with  . Urinary Frequency   Subjective:    Urinary Frequency   This is a recurrent problem. The current episode started 1 to 4 weeks ago. The problem occurs every urination. The problem has been unchanged. The quality of the pain is described as burning. The pain is mild. There has been no fever. Associated symptoms include chills, flank pain, frequency and nausea. She has tried nothing for the symptoms. Her past medical history is significant for recurrent UTIs.      Allergies  Allergen Reactions  . Cefaclor Anaphylaxis  . Cephalosporins Anaphylaxis  . Lubiprostone     Other reaction(s): Angioedema Tongue Swelling x 2 times.  Other reaction(s): Angioedema Tongue Swelling x 2 times.   . Celecoxib   . Lac Bovis     Other reaction(s): Other (See Comments) GI Upset to milk protein  . Apple Nausea And Vomiting  . Pseudoephedrine Palpitations    Tachycardia     Current Outpatient Medications:  .  cyclobenzaprine (FLEXERIL) 5 MG tablet, TAKE 1 TO 2 TABLETS BY MOUTH AT BEDTIME, Disp: 30 tablet, Rfl: 5 .  DYMISTA 137-50 MCG/ACT SUSP, , Disp: , Rfl: 1 .  Eszopiclone 3 MG TABS, Take 1 tablet (3 mg total) by mouth at bedtime. Take immediately before bedtime, Disp: 30 tablet, Rfl: 3 .  gabapentin (NEURONTIN) 100 MG capsule, Take 1 capsule (100 mg total) by mouth at bedtime., Disp: 90 capsule, Rfl: 0 .  glucosamine-chondroitin 500-400 MG tablet, Take 1 tablet by mouth 3 (three) times daily., Disp: , Rfl:  .  levocetirizine (XYZAL) 5 MG tablet, Take 5 mg by mouth every evening., Disp: , Rfl: 0 .  levothyroxine (SYNTHROID, LEVOTHROID) 25 MCG tablet, TAKE 1 TABLET BY MOUTH  DAILY BEFORE BREAKFAST, Disp: 90 tablet, Rfl: 1 .  montelukast (SINGULAIR) 10 MG tablet, Take 10 mg by mouth daily., Disp: , Rfl:  0 .  predniSONE (STERAPRED UNI-PAK 21 TAB) 10 MG (21) TBPK tablet, 6 day taper; take as directed on package instructions, Disp: 21 tablet, Rfl: 0 .  valACYclovir (VALTREX) 500 MG tablet, Take 1 tablet (500 mg total) by mouth 2 (two) times daily., Disp: 30 tablet, Rfl: 3  Review of Systems  Constitutional: Positive for chills and fatigue.  Respiratory: Negative.   Cardiovascular: Negative.   Gastrointestinal: Positive for nausea.  Genitourinary: Positive for flank pain and frequency.  Neurological: Negative.   All other systems reviewed and are negative.   Social History   Tobacco Use  . Smoking status: Former Smoker    Types: Cigarettes    Last attempt to quit: 05/15/2015    Years since quitting: 2.7  . Smokeless tobacco: Never Used  Substance Use Topics  . Alcohol use: No    Alcohol/week: 0.0 oz   Objective:   BP 110/78 (BP Location: Left Arm, Patient Position: Sitting, Cuff Size: Normal)   Pulse (!) 58   Temp 98.6 F (37 C) (Oral)   Wt 135 lb (61.2 kg)   SpO2 97%   BMI 24.69 kg/m  Vitals:   02/19/18 1323  BP: 110/78  Pulse: (!) 58  Temp: 98.6 F (37 C)  TempSrc: Oral  SpO2: 97%  Weight: 135 lb (61.2 kg)     Physical Exam  Constitutional: She appears well-developed  and well-nourished. No distress.  Neck: Normal range of motion. Neck supple. No JVD present. No tracheal deviation present. No thyromegaly present.  Cardiovascular: Normal rate, regular rhythm and normal heart sounds. Exam reveals no gallop and no friction rub.  No murmur heard. Pulmonary/Chest: Effort normal and breath sounds normal. No respiratory distress. She has no wheezes. She has no rales.  Genitourinary:    There is no rash, tenderness, lesion or injury on the right labia. There is no rash, tenderness, lesion or injury on the left labia. There is erythema in the vagina. No tenderness or bleeding in the vagina. No foreign body in the vagina. No signs of injury around the vagina. No vaginal  discharge found.  Lymphadenopathy:    She has no cervical adenopathy.  Skin: She is not diaphoretic.  Vitals reviewed.       Assessment & Plan:     1. Dysuria UA normal. Will repeat culture to see if beta strep cleared. NuSwab collected for STD screen and r/o BV/yeast as well. Suspect symptoms are associated with menopausal changes and vaginal atrophy instead of infectious. Will check labs for menopause since patient has had a hysterectomy and cannot base of her menstrual cycles. I will f/u pending all testing.  - POCT urinalysis dipstick - NuSwab Vaginitis Plus (VG+) - FSH/LH - Estrogens, total - Urine Culture  2. High risk heterosexual behavior Previous partner had an ex that had high risk behavior. See above medical treatment plan. - NuSwab Vaginitis Plus (VG+) - HIV antibody (with reflex) - RPR  3. Fatigue, unspecified type See above medical treatment plan. - FSH/LH - Estrogens, total - Thyroid Panel With TSH  4. Mood swing See above medical treatment plan. - FSH/LH - Estrogens, total - Thyroid Panel With TSH  5. Hot flashes See above medical treatment plan. - FSH/LH - Estrogens, total - Thyroid Panel With TSH       Mar Daring, PA-C  [facility]

## 2018-02-19 NOTE — Patient Instructions (Signed)
Menopause and Herbal Products What is menopause? Menopause is the normal time of life when menstrual periods decrease in frequency and eventually stop completely. This process can take several years for some women. Menopause is complete when you have had an absence of menstruation for a full year since your last menstrual period. It usually occurs between the ages of 48 and 55. It is not common for menopause to begin before the age of 40. During menopause, your body stops producing the female hormones estrogen and progesterone. Common symptoms associated with this loss of hormones (vasomotor symptoms) are:  Hot flashes.  Hot flushes.  Night sweats.  Other common symptoms and complications of menopause include:  Decrease in sex drive.  Vaginal dryness and thinning of the walls of the vagina. This can make sex painful.  Dryness of the skin and development of wrinkles.  Headaches.  Tiredness.  Irritability.  Memory problems.  Weight gain.  Bladder infections.  Hair growth on the face and chest.  Inability to reproduce offspring (infertility).  Loss of density in the bones (osteoporosis) increasing your risk for breaks (fractures).  Depression.  Hardening and narrowing of the arteries (atherosclerosis). This increases your risk of heart attack and stroke.  What treatment options are available? There are many treatment choices for menopause symptoms. The most common treatment is hormone replacement therapy. Many alternative therapies for menopause are emerging, including the use of herbal products. These supplements can be found in the form of herbs, teas, oils, tinctures, and pills. Common herbal supplements for menopause are made from plants that contain phytoestrogens. Phytoestrogens are compounds that occur naturally in plants and plant products. They act like estrogen in the body. Foods and herbs that contain phytoestrogens include:  Soy.  Flax seeds.  Red  clover.  Ginseng.  What menopause symptoms may be helped if I use herbal products?  Vasomotor symptoms. These may be helped by: ? Soy. Some studies show that soy may have a moderate benefit for hot flashes. ? Black cohosh. There is limited evidence indicating this may be beneficial for hot flashes.  Symptoms that are related to heart and blood vessel disease. These may be helped by soy. Studies have shown that soy can help to lower cholesterol.  Depression. This may be helped by: ? St. John's wort. There is limited evidence that shows this may help mild to moderate depression. ? Black cohosh. There is evidence that this may help depression and mood swings.  Osteoporosis. Soy may help to decrease bone loss that is associated with menopause and may prevent osteoporosis. Limited evidence indicates that red clover may offer some bone loss protection as well. Other herbal products that are commonly used during menopause lack enough evidence to support their use as a replacement for conventional menopause therapies. These products include evening primrose, ginseng, and red clover. What are the cases when herbal products should not be used during menopause? Do not use herbal products during menopause without your health care provider's approval if:  You are taking medicine.  You have a preexisting liver condition.  Are there any risks in my taking herbal products during menopause? If you choose to use herbal products to help with symptoms of menopause, keep in mind that:  Different supplements have different and unmeasured amounts of herbal ingredients.  Herbal products are not regulated the same way that medicines are.  Concentrations of herbs may vary depending on the way they are prepared. For example, the concentration may be different in a pill,   tea, oil, and tincture.  Little is known about the risks of using herbal products, particularly the risks of long-term use.  Some herbal  supplements can be harmful when combined with certain medicines.  Most commonly reported side effects of herbal products are mild. However, if used improperly, many herbal supplements can cause serious problems. Talk to your health care provider before starting any herbal product. If problems develop, stop taking the supplement and let your health care provider know. This information is not intended to replace advice given to you by your health care provider. Make sure you discuss any questions you have with your health care provider. Document Released: 04/01/2008 Document Revised: 09/10/2016 Document Reviewed: 03/29/2014 Elsevier Interactive Patient Education  2017 Elsevier Inc. Dysuria Dysuria is pain or discomfort while urinating. The pain or discomfort may be felt in the tube that carries urine out of the bladder (urethra) or in the surrounding tissue of the genitals. The pain may also be felt in the groin area, lower abdomen, and lower back. You may have to urinate frequently or have the sudden feeling that you have to urinate (urgency). Dysuria can affect both men and women, but is more common in women. Dysuria can be caused by many different things, including:  Urinary tract infection in women.  Infection of the kidney or bladder.  Kidney stones or bladder stones.  Certain sexually transmitted infections (STIs), such as chlamydia.  Dehydration.  Inflammation of the vagina.  Use of certain medicines.  Use of certain soaps or scented products that cause irritation.  Follow these instructions at home: Watch your dysuria for any changes. The following actions may help to reduce any discomfort you are feeling:  Drink enough fluid to keep your urine clear or pale yellow.  Empty your bladder often. Avoid holding urine for long periods of time.  After a bowel movement or urination, women should cleanse from front to back, using each tissue only once.  Empty your bladder after  sexual intercourse.  Take medicines only as directed by your health care provider.  If you were prescribed an antibiotic medicine, finish it all even if you start to feel better.  Avoid caffeine, tea, and alcohol. They can irritate the bladder and make dysuria worse. In men, alcohol may irritate the prostate.  Keep all follow-up visits as directed by your health care provider. This is important.  If you had any tests done to find the cause of dysuria, it is your responsibility to obtain your test results. Ask the lab or department performing the test when and how you will get your results. Talk with your health care provider if you have any questions about your results.  Contact a health care provider if:  You develop pain in your back or sides.  You have a fever.  You have nausea or vomiting.  You have blood in your urine.  You are not urinating as often as you usually do. Get help right away if:  You pain is severe and not relieved with medicines.  You are unable to hold down any fluids.  You or someone else notices a change in your mental function.  You have a rapid heartbeat at rest.  You have shaking or chills.  You feel extremely weak. This information is not intended to replace advice given to you by your health care provider. Make sure you discuss any questions you have with your health care provider. Document Released: 07/12/2004 Document Revised: 03/21/2016 Document Reviewed: 06/09/2014 Elsevier Interactive  Patient Education  [employer].

## 2018-02-21 LAB — RPR: RPR Ser Ql: NONREACTIVE

## 2018-02-21 LAB — THYROID PANEL WITH TSH
Free Thyroxine Index: 1.7 (ref 1.2–4.9)
T3 Uptake Ratio: 26 % (ref 24–39)
T4, Total: 6.6 ug/dL (ref 4.5–12.0)
TSH: 0.551 u[IU]/mL (ref 0.450–4.500)

## 2018-02-21 LAB — NUSWAB VAGINITIS PLUS (VG+)
Candida albicans, NAA: NEGATIVE
Candida glabrata, NAA: NEGATIVE
Chlamydia trachomatis, NAA: NEGATIVE
Neisseria gonorrhoeae, NAA: NEGATIVE
Trich vag by NAA: NEGATIVE

## 2018-02-21 LAB — URINE CULTURE: Organism ID, Bacteria: NO GROWTH

## 2018-02-21 LAB — FSH/LH
FSH: 67.2 m[IU]/mL
LH: 44.2 m[IU]/mL

## 2018-02-21 LAB — HIV ANTIBODY (ROUTINE TESTING W REFLEX): HIV Screen 4th Generation wRfx: NONREACTIVE

## 2018-02-21 LAB — ESTROGENS, TOTAL: Estrogen: 56 pg/mL

## 2018-02-23 ENCOUNTER — Telehealth: Payer: Self-pay | Admitting: Physician Assistant

## 2018-02-23 MED ORDER — CLARITHROMYCIN 500 MG PO TABS: 500.0000 mg | ORAL_TABLET | Freq: Two times a day (BID) | ORAL | 0 refills | Status: DC

## 2018-02-23 NOTE — Telephone Encounter (Signed)
Received 2 different set of forms from [employer]. Forms were placed in providers box. Thanks CC

## 2018-02-23 NOTE — Telephone Encounter (Signed)
Culture is negative. Patient aware through [employer]

## 2018-02-23 NOTE — Addendum Note (Signed)
Addended by: Mar Daring on: 02/23/2018 03:15 PM   Modules accepted: Orders

## 2018-02-23 NOTE — Telephone Encounter (Signed)
Pt stated she was advised that her urine culture results should be in today. Pt is requesting that pending the results if an Rx needs to be sent in she is requesting it be sent to [facility] on [address] in [place] that is close to her work instead of her preferred pharmacy. Please advise. Thanks TNP

## 2018-02-24 NOTE — Telephone Encounter (Signed)
On provider's desk for review.

## 2018-02-26 NOTE — Telephone Encounter (Signed)
Forms have been faxed to [phone].

## 2018-02-26 NOTE — Telephone Encounter (Signed)
FMLA forms completed and will be given to CMA for faxing.

## 2018-03-04 MED ORDER — PAROXETINE HCL 10 MG PO TABS: 10.0000 mg | ORAL_TABLET | Freq: Every day | ORAL | 0 refills | Status: DC

## 2018-03-04 NOTE — Addendum Note (Signed)
Addended by: Mar Daring on: 03/04/2018 08:20 AM   Modules accepted: Orders

## 2018-03-12 ENCOUNTER — Other Ambulatory Visit: Payer: Self-pay | Admitting: Physician Assistant

## 2018-03-16 ENCOUNTER — Ambulatory Visit: Payer: Self-pay | Admitting: Internal Medicine

## 2018-03-18 ENCOUNTER — Telehealth: Payer: Self-pay | Admitting: Physician Assistant

## 2018-03-18 NOTE — Telephone Encounter (Signed)
Received [employer] form  [employer] manager # [id number]). Placed form in providers box. Thanks CC

## 2018-03-27 ENCOUNTER — Ambulatory Visit: Payer: Managed Care, Other (non HMO) | Admitting: Physician Assistant

## 2018-03-29 ENCOUNTER — Other Ambulatory Visit: Payer: Self-pay | Admitting: Physician Assistant

## 2018-03-29 DIAGNOSIS — N951 Menopausal and female climacteric states: Secondary | ICD-10-CM

## 2018-03-30 ENCOUNTER — Encounter: Payer: Self-pay | Admitting: Physician Assistant

## 2018-04-13 ENCOUNTER — Encounter: Payer: Self-pay | Admitting: Physician Assistant

## 2018-04-13 DIAGNOSIS — B379 Candidiasis, unspecified: Secondary | ICD-10-CM

## 2018-04-13 DIAGNOSIS — T3695XA Adverse effect of unspecified systemic antibiotic, initial encounter: Principal | ICD-10-CM

## 2018-04-13 MED ORDER — FLUCONAZOLE 150 MG PO TABS: 150.0000 mg | ORAL_TABLET | Freq: Once | ORAL | 0 refills | Status: AC

## 2018-04-14 ENCOUNTER — Ambulatory Visit: Payer: Self-pay | Admitting: Internal Medicine

## 2018-04-21 ENCOUNTER — Encounter: Payer: Self-pay | Admitting: Physician Assistant

## 2018-05-11 ENCOUNTER — Encounter: Payer: Self-pay | Admitting: Physician Assistant

## 2018-05-11 DIAGNOSIS — N644 Mastodynia: Secondary | ICD-10-CM

## 2018-05-11 DIAGNOSIS — R300 Dysuria: Secondary | ICD-10-CM

## 2018-05-12 ENCOUNTER — Ambulatory Visit
Admission: RE | Admit: 2018-05-12 | Discharge: 2018-05-12 | Disposition: A | Discharge status: Home / Self Care | Payer: Managed Care, Other (non HMO) | Source: Ambulatory Visit | Attending: Internal Medicine | Admitting: Internal Medicine

## 2018-05-12 ENCOUNTER — Encounter: Payer: Self-pay | Admitting: Internal Medicine

## 2018-05-12 ENCOUNTER — Ambulatory Visit: Payer: Managed Care, Other (non HMO) | Admitting: Internal Medicine

## 2018-05-12 VITALS — BP 128/78 | HR 86 | Resp 16 | Ht 62.0 in | Wt 132.2 lb

## 2018-05-12 DIAGNOSIS — M359 Systemic involvement of connective tissue, unspecified: Secondary | ICD-10-CM | POA: Insufficient documentation

## 2018-05-12 DIAGNOSIS — G4700 Insomnia, unspecified: Secondary | ICD-10-CM

## 2018-05-12 DIAGNOSIS — J301 Allergic rhinitis due to pollen: Secondary | ICD-10-CM

## 2018-05-12 NOTE — Progress Notes (Signed)
[facility] [place], [place] [address]  Pulmonary Sleep Medicine   Office Visit Note  Patient Name: Theresa Walton DOB: 05/14/1966 MRN [id number]  Date of Service: 05/12/2018  Complaints/HPI:   She states that her sleep has been somewhat distorted.  She is been having some issues with being able to fall asleep again.  She suffered a tooth abscess end-stage set this has thrown her sleep cycle off kilter.  An interesting fact she told me was that her mother apparently passed away from pulmonary fibrosis.  The patient herself has a diagnosis of connective tissue disorder and she has been having some issues with prolapsed the uterus etc.  She has not had a chest x-ray done in the past 10 years that I could see.  I think that based on her history it would be reasonable to of get a chest x-ray at this time.  As far as her sleep is concerned she is taking the so not and she has enough medication to continue taking it.  She states the melatonin has not really been helping her  ROS  General: (-) fever, (-) chills, (-) night sweats, (-) weakness Skin: (-) rashes, (-) itching,. Eyes: (-) visual changes, (-) redness, (-) itching. Nose and Sinuses: (-) nasal stuffiness or itchiness, (-) postnasal drip, (-) nosebleeds, (-) sinus trouble. Mouth and Throat: (-) sore throat, (-) hoarseness. Neck: (-) swollen glands, (-) enlarged thyroid, (-) neck pain. Respiratory: - cough, (-) bloody sputum, - shortness of breath, - wheezing. Cardiovascular: - ankle swelling, (-) chest pain. Lymphatic: (-) lymph node enlargement. Neurologic: (-) numbness, (-) tingling. Psychiatric: (-) anxiety, (-) depression   Current Medication: Outpatient Encounter Medications as of 05/12/2018  Medication Sig  . cyclobenzaprine (FLEXERIL) 5 MG tablet TAKE 1 TO 2 TABLETS BY MOUTH AT BEDTIME  . EPINEPHrine (EPIPEN 2-PAK IJ) Inject as directed.  . Eszopiclone 3 MG TABS Take 1 tablet (3 mg total) by mouth  at bedtime. Take immediately before bedtime  . glucosamine-chondroitin 500-400 MG tablet Take 1 tablet by mouth 3 (three) times daily.  Marland Kitchen levothyroxine (SYNTHROID, LEVOTHROID) 25 MCG tablet TAKE 1 TABLET BY MOUTH  DAILY BEFORE BREAKFAST  . valACYclovir (VALTREX) 500 MG tablet Take 1 tablet (500 mg total) by mouth 2 (two) times daily.  . [DISCONTINUED] clarithromycin (BIAXIN) 500 MG tablet Take 1 tablet (500 mg total) by mouth 2 (two) times daily.  . [DISCONTINUED] DYMISTA 137-50 MCG/ACT SUSP   . [DISCONTINUED] gabapentin (NEURONTIN) 100 MG capsule Take 1 capsule (100 mg total) by mouth at bedtime. (Patient not taking: Reported on 05/12/2018)  . [DISCONTINUED] levocetirizine (XYZAL) 5 MG tablet Take 5 mg by mouth every evening.  . [DISCONTINUED] montelukast (SINGULAIR) 10 MG tablet Take 10 mg by mouth daily.  . [DISCONTINUED] PARoxetine (PAXIL) 10 MG tablet TAKE 1 TABLET(10 MG) BY MOUTH DAILY (Patient not taking: Reported on 05/12/2018)  . [DISCONTINUED] predniSONE (STERAPRED UNI-PAK 21 TAB) 10 MG (21) TBPK tablet 6 day taper; take as directed on package instructions   No facility-administered encounter medications on file as of 05/12/2018.     Surgical History: Past Surgical History:  Procedure Laterality Date  . ABDOMINAL HYSTERECTOMY    . ANTERIOR CRUCIATE LIGAMENT REPAIR Right 2012  . ARTHROSCOPIC REPAIR ACL  2013   Right knee  . AUGMENTATION MAMMAPLASTY Bilateral 2000   saline  . BLADDER REPAIR    . BREAST ENHANCEMENT SURGERY  1995  . COLONOSCOPY WITH PROPOFOL N/A 08/29/2017   Procedure: COLONOSCOPY WITH  PROPOFOL;  Surgeon: Jonathon Bellows, MD;  Location: [facility] ENDOSCOPY;  Service: Gastroenterology;  Laterality: N/A;  . CYSTOCELE REPAIR  2013  . ENDOMETRIAL ABLATION  2013  . [facility], 2001  . PARTIAL HYSTERECTOMY  2015  . PARTIAL HYSTERECTOMY Bilateral 2014  . RECTOPEXY  2011  . TONSILECTOMY, ADENOIDECTOMY, BILATERAL MYRINGOTOMY AND TUBES  1984  . TOOTH  EXTRACTION      Medical History: Past Medical History:  Diagnosis Date  . 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[place])   . Genital herpes   . Hypothyroidism   . Seasonal allergies   . Syncope and collapse   . Thyroid disease    hypothyroidism    Family History: Family History  Problem Relation Age of Onset  . Pulmonary fibrosis Mother   . Healthy Sister   . Healthy Brother   . Healthy Sister   . Lung cancer Paternal Grandmother   . Arrhythmia Father   . Heart disease Father   . Prostate cancer Neg Hx   . Bladder Cancer Neg Hx   . Kidney cancer Neg Hx   . Breast cancer Neg Hx     Social History: Social History   Socioeconomic History  . Marital status: Divorced    Spouse name: Not on file  . Number of children: Not on file  . Years of education: Not on file  . Highest education level: Not on file  Occupational History  . Not on file  Social Needs  . Financial resource strain: Not on file  . Food insecurity:    Worry: Not on file    Inability: Not on file  . Transportation needs:    Medical: Not on file    Non-medical: Not on file  Tobacco Use  . Smoking status: Former Smoker    Types: Cigarettes    Last attempt to quit: 05/15/2015    Years since quitting: 2.9  . Smokeless tobacco: Never Used  Substance and Sexual Activity  . Alcohol use: No    Alcohol/week: 0.0 oz  . Drug use: No  . Sexual activity: Never    Partners: Male    Birth control/protection: Surgical  Lifestyle  . Physical activity:    Days per week: Not on file    Minutes per session: Not on file  . Stress: Not on file  Relationships  . Social connections:    Talks on phone: Not on file    Gets together: Not on file    Attends religious service: Not on file    Active member of club or organization: Not on file    Attends meetings of clubs or organizations: Not on file    Relationship status: Not on file  . Intimate partner violence:    Fear of current or ex partner: Not on file    Emotionally  abused: Not on file    Physically abused: Not on file    Forced sexual activity: Not on file  Other Topics Concern  . Not on file  Social History Narrative  . Not on file    Vital Signs: Blood pressure 128/78, pulse 86, resp. rate 16, height 5\' 2"  (1.575 m), weight 132 lb 3.2 oz (60 kg), SpO2 99 %.  Examination: General Appearance: The patient is well-developed, well-nourished, and in no distress. Skin: Gross inspection of skin unremarkable. Head: normocephalic, no gross deformities. Eyes: no gross deformities noted. ENT: ears appear grossly normal no exudates. Neck: Supple. No thyromegaly. No LAD. Respiratory: no rhonchi.  Cardiovascular: Normal S1 and S2 without murmur or rub. Extremities: No cyanosis. pulses are equal. Neurologic: Alert and oriented. No involuntary movements.  LABS: Recent Results (from the past 2160 hour(s))  NuSwab Vaginitis Plus (VG+)     Status: None   Collection Time: 02/19/18  1:10 PM  Result Value Ref Range   Atopobium vaginae Low - 0 Score   BVAB 2 Low - 0 Score   Megasphaera 1 Low - 0 Score    Comment: Calculate total score by adding the 3 individual bacterial vaginosis (BV) marker scores together.  Total score is interpreted as follows: Total score 0-1: Indicates the absence of BV. Total score   2: Indeterminate for BV. Additional clinical                  data should be evaluated to establish a                  diagnosis. Total score 3-6: Indicates the presence of BV. This test was developed and its performance characteristics determined by [facility].  It has not been cleared or approved by the Food and Drug Administration.  The FDA has determined that such clearance or approval is not necessary.    Candida albicans, NAA Negative Negative   Candida glabrata, NAA Negative Negative    Comment: This test was developed and its performance characteristics determined by [facility].  It has not been cleared or approved by the Food and  Drug Administration.  The FDA has determined that such clearance or approval is not necessary.    Trich vag by NAA Negative Negative   Chlamydia trachomatis, NAA Negative Negative   Neisseria gonorrhoeae, NAA Negative Negative  Urine Culture     Status: None   Collection Time: 02/19/18  1:30 PM  Result Value Ref Range   Urine Culture, Routine Final report    Organism ID, Bacteria No growth   POCT urinalysis dipstick     Status: None   Collection Time: 02/19/18  2:01 PM  Result Value Ref Range   Color, UA yellow    Clarity, UA clear    Glucose, UA negative    Bilirubin, UA negative    Ketones, UA negative    Spec Grav, UA 1.010 1.010 - 1.025   Blood, UA negative    pH, UA 6.0 5.0 - 8.0   Protein, UA negative    Urobilinogen, UA 0.2 0.2 or 1.0 E.U./dL   Nitrite, UA negative    Leukocytes, UA Negative Negative   Appearance     Odor    HIV antibody (with reflex)     Status: None   Collection Time: 02/19/18  2:11 PM  Result Value Ref Range   HIV Screen 4th Generation wRfx Non Reactive Non Reactive  RPR     Status: None   Collection Time: 02/19/18  2:11 PM  Result Value Ref Range   RPR Ser Ql Non Reactive Non Reactive  FSH/LH     Status: None   Collection Time: 02/19/18  2:11 PM  Result Value Ref Range   LH 44.2 mIU/mL    Comment:                     Adult Female:                       Follicular phase      2.4 -  12.6  Ovulation phase      14.0 -  95.6                       Luteal phase          1.0 -  11.4                       Postmenopausal        7.7 -  58.5    FSH 67.2 mIU/mL    Comment:                     Adult Female:                       Follicular phase      3.5 -  12.5                       Ovulation phase       4.7 -  21.5                       Luteal phase          1.7 -   7.7                       Postmenopausal       25.8 - 134.8   Estrogens, total     Status: None   Collection Time: 02/19/18  2:11 PM  Result Value Ref Range    Estrogen 56 pg/mL    Comment:                             Prepubertal           <40                             Female Cycle:                               1-10 Days      61 - 394                              11-20 Days     122 - 437                              21-30 Days     156 - 350                              Post-Menopausal      <40                           HMG Treatment for Ovulation                              Induction:     400 - 800   Thyroid Panel With TSH     Status: None   Collection Time: 02/19/18  2:11 PM  Result Value Ref Range  TSH 0.551 0.450 - 4.500 uIU/mL   T4, Total 6.6 4.5 - 12.0 ug/dL   T3 Uptake Ratio 26 24 - 39 %   Free Thyroxine Index 1.7 1.2 - 4.9    Radiology: Mm Screening Breast W/implant Tomo Bilateral  Result Date: 02/13/2018 CLINICAL DATA:  Screening. EXAM: DIGITAL SCREENING BILATERAL MAMMOGRAM WITH IMPLANTS, CAD AND TOMO The patient has retropectoral implants. Standard and implant displaced views were performed. COMPARISON:  Previous exam(s). ACR Breast Density Category c: The breast tissue is heterogeneously dense, which may obscure small masses. FINDINGS: There are no findings suspicious for malignancy. Images were processed with CAD. IMPRESSION: No mammographic evidence of malignancy. A result letter of this screening mammogram will be mailed directly to the patient. RECOMMENDATION: Screening mammogram in one year. (Code:SM-B-01Y) BI-RADS CATEGORY  1:  Negative. Electronically Signed   By: Fidela Salisbury M.D.   On: 02/13/2018 09:21    No results found.  No results found.    Assessment and Plan: Patient Active Problem List   Diagnosis Date Noted  . History of pubovaginal sling 04/01/2017  . Essential tremor 05/15/2016  . Allergic rhinitis 06/22/2015  . B12 deficiency 06/21/2015  . H/O: hysterectomy 06/21/2015  . Hypertriglyceridemia 06/21/2015  . Hypoglycemia 06/21/2015  . Menopausal and perimenopausal disorder 06/21/2015  .  Vasovagal symptom 06/21/2015  . Insomnia 04/19/2015  . 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 Chronic constipation 08/31/2014  . Adult hypothyroidism 08/28/2009  . Avitaminosis D 08/28/2009  . Arthropathia 02/10/2009  . Chronic infection of sinus 02/01/2009  . Adaptation reaction 09/09/2008  . Genital herpes simplex type 1 infection 07/13/2008  . Current tobacco use 07/13/2008    1. Insomnia discuss sleep hygiene once again with therapy.  She will continue short-term usage subglottic.  We will continue with supportive care.  She once again does not want to try CBT and  Psychiatry 30 2. Allergic rhinitis controlled we will continue with present management  3. Family h/o Pulmonary Fibrosis/Connective tissue disorder will get a chest x-ray as a baseline to evaluate make certain she does not have any interstitial lung disease based on her mother's history of pulmonary fibrosis  General Counseling: I have discussed the findings of the evaluation and examination with Florean.  I have also discussed any further diagnostic evaluation thatmay be needed or ordered today. Aitana verbalizes understanding of the findings of todays visit. We also reviewed her medications today and discussed drug interactions and side effects including but not limited excessive drowsiness and altered mental states. We also discussed that there is always a risk not just to her but also people around her. she has been encouraged to call the office with any questions or concerns that should arise related to todays visit.    Time spent: 33min  I have personally obtained a history, examined the patient, evaluated laboratory and imaging results, formulated the assessment and plan and placed orders.    Allyne Gee, MD [facility] Pulmonary and Critical Care Sleep medicine

## 2018-05-12 NOTE — Patient Instructions (Signed)

## 2018-05-14 ENCOUNTER — Telehealth: Payer: Self-pay

## 2018-05-14 ENCOUNTER — Other Ambulatory Visit: Payer: Self-pay | Admitting: Physician Assistant

## 2018-05-14 DIAGNOSIS — N3001 Acute cystitis with hematuria: Secondary | ICD-10-CM

## 2018-05-14 LAB — URINALYSIS, ROUTINE W REFLEX MICROSCOPIC
Bilirubin, UA: NEGATIVE
Glucose, UA: NEGATIVE
Ketones, UA: NEGATIVE
Nitrite, UA: NEGATIVE
Protein, UA: NEGATIVE
Specific Gravity, UA: 1.017 (ref 1.005–1.030)
Urobilinogen, Ur: 0.2 mg/dL (ref 0.2–1.0)
pH, UA: 6.5 (ref 5.0–7.5)

## 2018-05-14 LAB — MICROSCOPIC EXAMINATION
Casts: NONE SEEN /lpf
Epithelial Cells (non renal): >10 /hpf — AB (ref 0–10)
WBC, UA: >30 /hpf — AB (ref 0–5)

## 2018-05-14 LAB — URINE CULTURE

## 2018-05-14 MED ORDER — CIPROFLOXACIN HCL 500 MG PO TABS: 500.0000 mg | ORAL_TABLET | Freq: Two times a day (BID) | ORAL | 0 refills | Status: DC

## 2018-05-14 NOTE — Telephone Encounter (Signed)
-----   Message from Mar Daring, PA-C sent at 05/14/2018 12:32 PM EDT ----- Urine culture is positive for Strep B. I will send in antibiotic for you.

## 2018-05-14 NOTE — Telephone Encounter (Signed)
Left message advising patient results and result note has been released to My Chart and RX has been sent to pharmacy to call back if she has any further questions.

## 2018-05-20 ENCOUNTER — Encounter: Payer: Self-pay | Admitting: Family Medicine

## 2018-05-21 ENCOUNTER — Encounter: Payer: Managed Care, Other (non HMO) | Admitting: Family Medicine

## 2018-05-21 ENCOUNTER — Ambulatory Visit: Payer: Managed Care, Other (non HMO) | Admitting: Family Medicine

## 2018-05-21 ENCOUNTER — Encounter: Payer: Self-pay | Admitting: Family Medicine

## 2018-05-21 VITALS — BP 107/75 | HR 72 | Wt 133.4 lb

## 2018-05-21 DIAGNOSIS — R300 Dysuria: Secondary | ICD-10-CM

## 2018-05-21 DIAGNOSIS — N959 Unspecified menopausal and perimenopausal disorder: Secondary | ICD-10-CM | POA: Diagnosis not present

## 2018-05-21 LAB — POCT URINE QUALITATIVE DIPSTICK BLOOD: Blood, UA: NEGATIVE

## 2018-05-21 MED ORDER — ESTRADIOL 10 MCG VA TABS: 10.0000 ug | ORAL_TABLET | Freq: Every day | VAGINAL | 3 refills | Status: DC

## 2018-05-21 NOTE — Progress Notes (Signed)
   Subjective:    Patient ID: Theresa Walton is a 52 y.o. female presenting with Breast Pain and Urinary Tract Infection  on 05/21/2018  HPI: Has had recurrent UTI's. Was celibate when they started. Now sexually active. Seen by Dr. Kenton Kingfisher at [facility] and sling was intact. Began in 12/17. Last seen here in 2016 with negative labs for menopause. Now with [place] in the 19s. Has hot flashes, night sweats.  Also reports breast tenderness. Last on Paxil to help with sleep, but stopped this and tenderness is getting better. She is s/p TVH and TVT with anterior repair.  Review of Systems  Constitutional: Negative for chills and fever.  Respiratory: Negative for shortness of breath.   Cardiovascular: Negative for chest pain.  Gastrointestinal: Negative for abdominal pain, nausea and vomiting.  Genitourinary: Negative for dysuria.  Skin: Negative for rash.      Objective:    BP 107/75   Pulse 72   Wt 133 lb 6.4 oz (60.5 kg)   BMI 24.40 kg/m  Physical Exam  Constitutional: She is oriented to person, place, and time. She appears well-developed and well-nourished. No distress.  HENT:  Head: Normocephalic and atraumatic.  Eyes: No scleral icterus.  Neck: Neck supple.  Cardiovascular: Normal rate.  Pulmonary/Chest: Effort normal.  Abdominal: Soft.  Genitourinary:  Genitourinary Comments: NEFG, BUS is WNL, vagina is pale, cervix is absent and vaginal cuff without lesion. There is no erosion of the vagina and the TVT. There is a loss of apical support anteriorly and posteriorly with good support distally.   Neurological: She is alert and oriented to person, place, and time.  Skin: Skin is warm and dry.  Psychiatric: She has a normal mood and affect.   U/A negative today     Assessment & Plan:   Problem List Items Addressed This Visit      Unprioritized   Menopausal and perimenopausal disorder    Recurrent UTI is most pressing. Added benefit of treating vaginal atrophy and  lubrication for intercourse. Will begin with vaginal femhrt. NO prog needed, s/p TVH. WHI, limitations, risks of HRT discussed at length including timing of starting meds, duration of treatment.        Other Visit Diagnoses    Dysuria    -  Primary   Relevant Orders   POCT urine qual dipstick blood (Completed)       Total face-to-face time with patient: 25 minutes. Over 50% of encounter was spent on counseling and coordination of care. Return in about 3 months (around 08/21/2018).  Donnamae Jude 05/21/2018 11:28 AM

## 2018-05-21 NOTE — Progress Notes (Signed)
Urine dipstick normal for blood leuk

## 2018-05-21 NOTE — Patient Instructions (Signed)
Atrophic Vaginitis Atrophic vaginitis is when the tissues that line the vagina become dry and thin. This is caused by a drop in estrogen. Estrogen helps:  To keep the vagina moist.  To make a clear fluid that helps: ? To lubricate the vagina for sex. ? To protect the vagina from infection.  If the lining of the vagina is dry and thin, it may:  Make sex painful. It may also cause bleeding.  Cause a feeling of: ? Burning. ? Irritation. ? Itchiness.  Make an exam of your vagina painful. It may also cause bleeding.  Make you lose interest in sex.  Cause a burning feeling when you pee.  Make your vaginal fluid (discharge) brown or yellow.  For some women, there are no symptoms. This condition is most common in women who do not get their regular menstrual periods anymore (menopause). This often starts when a woman is 45-55 years old. Follow these instructions at home:  Take medicines only as told by your doctor. Do not use any herbal or alternative medicines unless your doctor says it is okay.  Use over-the-counter products for dryness only as told by your doctor. These include: ? Creams. ? Lubricants. ? Moisturizers.  Do not douche.  Do not use products that can make your vagina dry. These include: ? Scented feminine sprays. ? Scented tampons. ? Scented soaps.  If it hurts to have sex, tell your sexual partner. Contact a doctor if:  Your discharge looks different than normal.  Your vagina has an unusual smell.  You have new symptoms.  Your symptoms do not get better with treatment.  Your symptoms get worse. This information is not intended to replace advice given to you by your health care provider. Make sure you discuss any questions you have with your health care provider. Document Released: 04/01/2008 Document Revised: 03/21/2016 Document Reviewed: 10/05/2014 Elsevier Interactive Patient Education  2018 Elsevier Inc.  

## 2018-05-21 NOTE — Assessment & Plan Note (Addendum)
Recurrent UTI is most pressing. Added benefit of treating vaginal atrophy and lubrication for intercourse. Will begin with vaginal femhrt. NO prog needed, s/p TVH. WHI, limitations, risks of HRT discussed at length including timing of starting meds, duration of treatment.

## 2018-06-17 ENCOUNTER — Encounter: Payer: Self-pay | Admitting: Physician Assistant

## 2018-06-17 DIAGNOSIS — A6004 Herpesviral vulvovaginitis: Secondary | ICD-10-CM

## 2018-06-17 MED ORDER — VALTREX 500 MG PO TABS: 500.0000 mg | ORAL_TABLET | Freq: Two times a day (BID) | ORAL | 1 refills | Status: DC

## 2018-06-26 DIAGNOSIS — C44319 Basal cell carcinoma of skin of other parts of face: Secondary | ICD-10-CM

## 2018-06-26 HISTORY — DX: Basal cell carcinoma of skin of other parts of face: C44.319

## 2018-07-27 ENCOUNTER — Other Ambulatory Visit: Payer: Self-pay | Admitting: Internal Medicine

## 2018-07-28 NOTE — Telephone Encounter (Signed)
Last appt 7/19 and next 10/2018 DSK pt

## 2018-08-05 DIAGNOSIS — M7540 Impingement syndrome of unspecified shoulder: Secondary | ICD-10-CM | POA: Insufficient documentation

## 2018-08-05 DIAGNOSIS — R609 Edema, unspecified: Secondary | ICD-10-CM | POA: Insufficient documentation

## 2018-08-05 DIAGNOSIS — M2350 Chronic instability of knee, unspecified knee: Secondary | ICD-10-CM | POA: Insufficient documentation

## 2018-08-05 DIAGNOSIS — M064 Inflammatory polyarthropathy: Secondary | ICD-10-CM | POA: Insufficient documentation

## 2018-08-05 DIAGNOSIS — S92309A Fracture of unspecified metatarsal bone(s), unspecified foot, initial encounter for closed fracture: Secondary | ICD-10-CM | POA: Insufficient documentation

## 2018-08-05 DIAGNOSIS — S52539A Colles' fracture of unspecified radius, initial encounter for closed fracture: Secondary | ICD-10-CM | POA: Insufficient documentation

## 2018-08-05 DIAGNOSIS — M25469 Effusion, unspecified knee: Secondary | ICD-10-CM | POA: Insufficient documentation

## 2018-08-05 DIAGNOSIS — M7650 Patellar tendinitis, unspecified knee: Secondary | ICD-10-CM | POA: Insufficient documentation

## 2018-08-14 ENCOUNTER — Encounter: Payer: Self-pay | Admitting: Physician Assistant

## 2018-08-14 DIAGNOSIS — A6004 Herpesviral vulvovaginitis: Secondary | ICD-10-CM

## 2018-08-14 MED ORDER — VALACYCLOVIR HCL 500 MG PO TABS: 500.0000 mg | ORAL_TABLET | Freq: Two times a day (BID) | ORAL | 1 refills | Status: DC

## 2018-08-19 ENCOUNTER — Encounter: Payer: Self-pay | Admitting: Physician Assistant

## 2018-08-19 ENCOUNTER — Ambulatory Visit: Payer: Managed Care, Other (non HMO) | Admitting: Physician Assistant

## 2018-08-19 VITALS — BP 100/60 | HR 98 | Temp 98.5°F | Resp 16 | Wt 135.0 lb

## 2018-08-19 DIAGNOSIS — J069 Acute upper respiratory infection, unspecified: Secondary | ICD-10-CM

## 2018-08-19 MED ORDER — DOXYCYCLINE HYCLATE 100 MG PO TABS: 100.0000 mg | ORAL_TABLET | Freq: Two times a day (BID) | ORAL | 0 refills | Status: AC

## 2018-08-19 NOTE — Progress Notes (Signed)
Patient: Theresa Walton Female    DOB: February 06, 1966   52 y.o.   MRN: [id number] Visit Date: 08/19/2018  Today's Provider: Trinna Post, PA-C   Chief Complaint  Patient presents with  . Sore Throat   Subjective:    HPI Patient here today c/o sore throat, swollen lymph node on left side of neck and eye discharge x's 2-3 days. Patient also c/o headache and nasal congestion. History of recurrent sinusitis, symptoms acutely worsening.   Recently had Mohs surgery on left chin/lip area several days ago.     Allergies  Allergen Reactions  . Cefaclor Anaphylaxis  . Cephalosporins Anaphylaxis  . Lubiprostone     Other reaction(s): Angioedema Tongue Swelling x 2 times.  Other reaction(s): Angioedema Tongue Swelling x 2 times.   . Celecoxib   . Lac Bovis     Other reaction(s): Other (See Comments) GI Upset to milk protein  . Apple Nausea And Vomiting  . Pseudoephedrine Palpitations    Tachycardia     Current Outpatient Medications:  .  ciprofloxacin (CIPRO) 500 MG tablet, Take 1 tablet (500 mg total) by mouth 2 (two) times daily. (Patient not taking: Reported on 05/21/2018), Disp: 10 tablet, Rfl: 0 .  cyclobenzaprine (FLEXERIL) 5 MG tablet, TAKE 1 TO 2 TABLETS BY MOUTH AT BEDTIME (Patient not taking: Reported on 05/21/2018), Disp: 30 tablet, Rfl: 5 .  EPINEPHrine (EPIPEN 2-PAK IJ), Inject as directed., Disp: , Rfl:  .  Estradiol 10 MCG TABS vaginal tablet, Place 1 tablet (10 mcg total) vaginally at bedtime., Disp: 90 tablet, Rfl: 3 .  Eszopiclone 3 MG TABS, TAKE 1 TABLET BY MOUTH AT BEDTIME, Disp: 30 tablet, Rfl: 0 .  glucosamine-chondroitin 500-400 MG tablet, Take 1 tablet by mouth 3 (three) times daily., Disp: , Rfl:  .  levothyroxine (SYNTHROID, LEVOTHROID) 25 MCG tablet, TAKE 1 TABLET BY MOUTH  DAILY BEFORE BREAKFAST, Disp: 90 tablet, Rfl: 1 .  valACYclovir (VALTREX) 500 MG tablet, Take 1 tablet (500 mg total) by mouth 2 (two) times daily., Disp: 180 tablet, Rfl:  1  Review of Systems  Social History   Tobacco Use  . Smoking status: Former Smoker    Types: Cigarettes    Last attempt to quit: 05/15/2015    Years since quitting: 3.2  . Smokeless tobacco: Never Used  Substance Use Topics  . Alcohol use: No    Alcohol/week: 0.0 standard drinks   Objective:   BP 100/60 (BP Location: Left Arm, Patient Position: Sitting, Cuff Size: Normal)   Pulse 98   Temp 98.5 F (36.9 C) (Oral)   Resp 16   Wt 135 lb (61.2 kg)   SpO2 98%   BMI 24.69 kg/m  Vitals:   08/19/18 1131  BP: 100/60  Pulse: 98  Resp: 16  Temp: 98.5 F (36.9 C)  TempSrc: Oral  SpO2: 98%  Weight: 135 lb (61.2 kg)     Physical Exam  Constitutional: She is oriented to person, place, and time. She appears well-developed and well-nourished. She does not appear ill.  HENT:  Right Ear: Tympanic membrane normal. No drainage.  Left Ear: Tympanic membrane normal. No drainage.  Mouth/Throat: Uvula is midline and mucous membranes are normal. No oropharyngeal exudate, posterior oropharyngeal edema or posterior oropharyngeal erythema.  Neck: Neck supple.  Single enlarged left sided cervical lymph node that is mobile and tender.  Lymphadenopathy:    She has cervical adenopathy.  Neurological: She is alert and oriented to person, place,  and time.  Skin: Skin is warm and dry.  Psychiatric: She has a normal mood and affect. Her behavior is normal.        Assessment & Plan:     1. Viral URI  Patient very adamant about receiving antibiotics. Do think her cervical adenopathy is from her Mohs surgery, please follow up if not resolving.  - doxycycline (VIBRA-TABS) 100 MG tablet; Take 1 tablet (100 mg total) by mouth 2 (two) times daily for 7 days.  Dispense: 14 tablet; Refill: 0  Return if symptoms worsen or fail to improve.  The entirety of the information documented in the History of Present Illness, Review of Systems and Physical Exam were personally obtained by me. Portions of  this information were initially documented by Lynford Humphrey, CMA and reviewed by me for thoroughness and accuracy.         Trinna Post, PA-C  [facility]

## 2018-08-19 NOTE — Patient Instructions (Signed)
Upper Respiratory Infection, Adult Most upper respiratory infections (URIs) are caused by a virus. A URI affects the nose, throat, and upper air passages. The most common type of URI is often called "the common cold." Follow these instructions at home:  Take medicines only as told by your doctor.  Gargle warm saltwater or take cough drops to comfort your throat as told by your doctor.  Use a warm mist humidifier or inhale steam from a shower to increase air moisture. This may make it easier to breathe.  Drink enough fluid to keep your pee (urine) clear or pale yellow.  Eat soups and other clear broths.  Have a healthy diet.  Rest as needed.  Go back to work when your fever is gone or your doctor says it is okay. ? You may need to stay home longer to avoid giving your URI to others. ? You can also wear a face mask and wash your hands often to prevent spread of the virus.  Use your inhaler more if you have asthma.  Do not use any tobacco products, including cigarettes, chewing tobacco, or electronic cigarettes. If you need help quitting, ask your doctor. Contact a doctor if:  You are getting worse, not better.  Your symptoms are not helped by medicine.  You have chills.  You are getting more short of breath.  You have brown or red mucus.  You have yellow or brown discharge from your nose.  You have pain in your face, especially when you bend forward.  You have a fever.  You have puffy (swollen) neck glands.  You have pain while swallowing.  You have white areas in the back of your throat. Get help right away if:  You have very bad or constant: ? Headache. ? Ear pain. ? Pain in your forehead, behind your eyes, and over your cheekbones (sinus pain). ? Chest pain.  You have long-lasting (chronic) lung disease and any of the following: ? Wheezing. ? Long-lasting cough. ? Coughing up blood. ? A change in your usual mucus.  You have a stiff neck.  You have  changes in your: ? Vision. ? Hearing. ? Thinking. ? Mood. This information is not intended to replace advice given to you by your health care provider. Make sure you discuss any questions you have with your health care provider. Document Released: 04/01/2008 Document Revised: 06/16/2016 Document Reviewed: 01/19/2014 Elsevier Interactive Patient Education  2018 Elsevier Inc.  

## 2018-09-15 ENCOUNTER — Other Ambulatory Visit: Payer: Self-pay | Admitting: Internal Medicine

## 2018-09-16 NOTE — Telephone Encounter (Signed)
Can you fill this? Patient next appointment in January

## 2018-10-22 ENCOUNTER — Encounter: Payer: Self-pay | Admitting: Physician Assistant

## 2018-10-22 ENCOUNTER — Ambulatory Visit: Payer: Managed Care, Other (non HMO) | Admitting: Physician Assistant

## 2018-10-22 VITALS — BP 113/78 | HR 72 | Temp 98.2°F | Resp 16 | Wt 135.0 lb

## 2018-10-22 DIAGNOSIS — B379 Candidiasis, unspecified: Secondary | ICD-10-CM | POA: Diagnosis not present

## 2018-10-22 DIAGNOSIS — J0100 Acute maxillary sinusitis, unspecified: Secondary | ICD-10-CM | POA: Diagnosis not present

## 2018-10-22 DIAGNOSIS — T3695XA Adverse effect of unspecified systemic antibiotic, initial encounter: Secondary | ICD-10-CM | POA: Diagnosis not present

## 2018-10-22 MED ORDER — PREDNISONE 10 MG (21) PO TBPK: ORAL_TABLET | ORAL | 0 refills | Status: DC

## 2018-10-22 MED ORDER — FLUCONAZOLE 150 MG PO TABS: 150.0000 mg | ORAL_TABLET | Freq: Once | ORAL | 0 refills | Status: AC

## 2018-10-22 MED ORDER — AMOXICILLIN-POT CLAVULANATE 875-125 MG PO TABS: 1.0000 | ORAL_TABLET | Freq: Two times a day (BID) | ORAL | 0 refills | Status: DC

## 2018-10-22 NOTE — Patient Instructions (Signed)

## 2018-10-22 NOTE — Progress Notes (Signed)
Patient: Theresa Walton Female    DOB: [birth year]   52 y.o.   MRN: [id number] Visit Date: 10/22/2018  Today's Provider: Mar Daring, PA-C   Chief Complaint  Patient presents with  . URI   Subjective:     HPI Upper Respiratory Infection: Patient complains of symptoms of a URI, possible sinusitis. Symptoms include congestion and cough. Onset of symptoms was 4 days ago, gradually worsening since that time. She also c/o productive cough with  green colored sputum for the past 1 day .  She is drinking plenty of fluids. Evaluation to date: none. Treatment to date: antihistamines, decongestants and nasal steroids. Singulair, dymista, Zyrtec    Allergies  Allergen Reactions  . Cefaclor Anaphylaxis  . Cephalosporins Anaphylaxis  . Lubiprostone     Other reaction(s): Angioedema Tongue Swelling x 2 times.  Other reaction(s): Angioedema Tongue Swelling x 2 times.   . Celecoxib   . Lac Bovis     Other reaction(s): Other (See Comments) GI Upset to milk protein  . Apple Nausea And Vomiting  . Pseudoephedrine Palpitations    Tachycardia     Current Outpatient Medications:  .  cyclobenzaprine (FLEXERIL) 5 MG tablet, TAKE 1 TO 2 TABLETS BY MOUTH AT BEDTIME, Disp: 30 tablet, Rfl: 5 .  EPINEPHrine (EPIPEN 2-PAK IJ), Inject as directed., Disp: , Rfl:  .  Estradiol 10 MCG TABS vaginal tablet, Place 1 tablet (10 mcg total) vaginally at bedtime., Disp: 90 tablet, Rfl: 3 .  Eszopiclone 3 MG TABS, TAKE 1 TABLET BY MOUTH AT BEDTIME, Disp: 30 tablet, Rfl: 0 .  glucosamine-chondroitin 500-400 MG tablet, Take 1 tablet by mouth 3 (three) times daily., Disp: , Rfl:  .  levothyroxine (SYNTHROID, LEVOTHROID) 25 MCG tablet, TAKE 1 TABLET BY MOUTH  DAILY BEFORE BREAKFAST, Disp: 90 tablet, Rfl: 1 .  valACYclovir (VALTREX) 500 MG tablet, Take 1 tablet (500 mg total) by mouth 2 (two) times daily., Disp: 180 tablet, Rfl: 1 .  Ascorbic Acid (VITAMIN C ER PO), Vitamin C, Disp: , Rfl:   Review  of Systems  Constitutional: Positive for chills.  HENT: Positive for congestion, postnasal drip, sinus pain and sore throat. Negative for ear pain.   Respiratory: Positive for cough.   Cardiovascular: Negative.   Neurological: Positive for headaches. Negative for dizziness.    Social History   Tobacco Use  . Smoking status: Former Smoker    Types: Cigarettes    Last attempt to quit: 05/15/2015    Years since quitting: 3.4  . Smokeless tobacco: Never Used  Substance Use Topics  . Alcohol use: No    Alcohol/week: 0.0 standard drinks      Objective:   BP 113/78 (BP Location: Left Arm, Patient Position: Sitting, Cuff Size: Normal)   Pulse 72   Temp 98.2 F (36.8 C) (Oral)   Resp 16   Wt 135 lb (61.2 kg)   SpO2 96%   BMI 24.69 kg/m  Vitals:   10/22/18 1201  BP: 113/78  Pulse: 72  Resp: 16  Temp: 98.2 F (36.8 C)  TempSrc: Oral  SpO2: 96%  Weight: 135 lb (61.2 kg)     Physical Exam Vitals signs reviewed.  Constitutional:      General: She is not in acute distress.    Appearance: She is well-developed. She is not diaphoretic.  HENT:     Head: Normocephalic and atraumatic.     Right Ear: Hearing, tympanic membrane, ear canal and external ear  normal.     Left Ear: Hearing, tympanic membrane, ear canal and external ear normal.     Nose:     Right Sinus: Maxillary sinus tenderness and frontal sinus tenderness present.     Left Sinus: Maxillary sinus tenderness and frontal sinus tenderness present.     Mouth/Throat:     Pharynx: Uvula midline. No oropharyngeal exudate.  Neck:     Musculoskeletal: Normal range of motion and neck supple.     Thyroid: No thyromegaly.     Trachea: No tracheal deviation.  Cardiovascular:     Rate and Rhythm: Normal rate and regular rhythm.     Heart sounds: Normal heart sounds. No murmur. No friction rub. No gallop.   Pulmonary:     Effort: Pulmonary effort is normal. No respiratory distress.     Breath sounds: Normal breath sounds.  No stridor. No wheezing or rales.  Lymphadenopathy:     Cervical: No cervical adenopathy.        Assessment & Plan    1. Acute non-recurrent maxillary sinusitis Worsening symptoms that have not responded to OTC medications. Will give augmentin and preednisone as below. Continue allergy medications. Stay well hydrated and get plenty of rest. Call if no symptom improvement or if symptoms worsen. - amoxicillin-clavulanate (AUGMENTIN) 875-125 MG tablet; Take 1 tablet by mouth 2 (two) times daily.  Dispense: 20 tablet; Refill: 0 - predniSONE (STERAPRED UNI-PAK 21 TAB) 10 MG (21) TBPK tablet; 6 day taper; take as directed on package instructions  Dispense: 21 tablet; Refill: 0  2. Antibiotic-induced yeast infection Given prophylactically as patient has a tendency to get yeast infections with augmentin use.  - fluconazole (DIFLUCAN) 150 MG tablet; Take 1 tablet (150 mg total) by mouth once for 1 dose.  Dispense: 1 tablet; Refill: 0     Mar Daring, PA-C  [facility]

## 2018-11-17 ENCOUNTER — Ambulatory Visit: Payer: Self-pay | Admitting: Internal Medicine

## 2018-11-24 ENCOUNTER — Ambulatory Visit: Payer: Self-pay | Admitting: Internal Medicine

## 2018-11-30 ENCOUNTER — Ambulatory Visit: Payer: Self-pay | Admitting: Internal Medicine

## 2018-11-30 ENCOUNTER — Encounter: Payer: Self-pay | Admitting: Family Medicine

## 2018-11-30 ENCOUNTER — Ambulatory Visit (INDEPENDENT_AMBULATORY_CARE_PROVIDER_SITE_OTHER): Payer: Managed Care, Other (non HMO) | Admitting: Family Medicine

## 2018-11-30 VITALS — BP 99/67 | HR 74 | Wt 136.0 lb

## 2018-11-30 DIAGNOSIS — Z01419 Encounter for gynecological examination (general) (routine) without abnormal findings: Secondary | ICD-10-CM

## 2018-11-30 DIAGNOSIS — N959 Unspecified menopausal and perimenopausal disorder: Secondary | ICD-10-CM

## 2018-11-30 MED ORDER — ESTRADIOL 10 MCG VA INST: 1.0000 | VAGINAL_INSERT | VAGINAL | 3 refills | Status: DC

## 2018-11-30 NOTE — Progress Notes (Signed)
Flu in Nov 2019 Mammogram 2019 Labs today  Colonoscopy

## 2018-11-30 NOTE — Assessment & Plan Note (Signed)
Continue vaginal estrogen.-no further UTI's noted, ok for 2x/wk dosing.

## 2018-11-30 NOTE — Patient Instructions (Signed)
Preventive Care 40-64 Years, Female Preventive care refers to lifestyle choices and visits with your health care provider that can promote health and wellness. What does preventive care include?   A yearly physical exam. This is also called an annual well check.  Dental exams once or twice a year.  Routine eye exams. Ask your health care provider how often you should have your eyes checked.  Personal lifestyle choices, including: ? Daily care of your teeth and gums. ? Regular physical activity. ? Eating a healthy diet. ? Avoiding tobacco and drug use. ? Limiting alcohol use. ? Practicing safe sex. ? Taking low-dose aspirin daily starting at age 50. ? Taking vitamin and mineral supplements as recommended by your health care provider. What happens during an annual well check? The services and screenings done by your health care provider during your annual well check will depend on your age, overall health, lifestyle risk factors, and family history of disease. Counseling Your health care provider may ask you questions about your:  Alcohol use.  Tobacco use.  Drug use.  Emotional well-being.  Home and relationship well-being.  Sexual activity.  Eating habits.  Work and work environment.  Method of birth control.  Menstrual cycle.  Pregnancy history. Screening You may have the following tests or measurements:  Height, weight, and BMI.  Blood pressure.  Lipid and cholesterol levels. These may be checked every 5 years, or more frequently if you are over 50 years old.  Skin check.  Lung cancer screening. You may have this screening every year starting at age 55 if you have a 30-pack-year history of smoking and currently smoke or have quit within the past 15 years.  Colorectal cancer screening. All adults should have this screening starting at age 50 and continuing until age 75. Your health care provider may recommend screening at age 45. You will have tests every  1-10 years, depending on your results and the type of screening test. People at increased risk should start screening at an earlier age. Screening tests may include: ? Guaiac-based fecal occult blood testing. ? Fecal immunochemical test (FIT). ? Stool DNA test. ? Virtual colonoscopy. ? Sigmoidoscopy. During this test, a flexible tube with a tiny camera (sigmoidoscope) is used to examine your rectum and lower colon. The sigmoidoscope is inserted through your anus into your rectum and lower colon. ? Colonoscopy. During this test, a long, thin, flexible tube with a tiny camera (colonoscope) is used to examine your entire colon and rectum.  Hepatitis C blood test.  Hepatitis B blood test.  Sexually transmitted disease (STD) testing.  Diabetes screening. This is done by checking your blood sugar (glucose) after you have not eaten for a while (fasting). You may have this done every 1-3 years.  Mammogram. This may be done every 1-2 years. Talk to your health care provider about when you should start having regular mammograms. This may depend on whether you have a family history of breast cancer.  BRCA-related cancer screening. This may be done if you have a family history of breast, ovarian, tubal, or peritoneal cancers.  Pelvic exam and Pap test. This may be done every 3 years starting at age 21. Starting at age 30, this may be done every 5 years if you have a Pap test in combination with an HPV test.  Bone density scan. This is done to screen for osteoporosis. You may have this scan if you are at high risk for osteoporosis. Discuss your test results, treatment options,   and if necessary, the need for more tests with your health care provider. Vaccines Your health care provider may recommend certain vaccines, such as:  Influenza vaccine. This is recommended every year.  Tetanus, diphtheria, and acellular pertussis (Tdap, Td) vaccine. You may need a Td booster every 10 years.  Varicella  vaccine. You may need this if you have not been vaccinated.  Zoster vaccine. You may need this after age 38.  Measles, mumps, and rubella (MMR) vaccine. You may need at least one dose of MMR if you were born in 1957 or later. You may also need a second dose.  Pneumococcal 13-valent conjugate (PCV13) vaccine. You may need this if you have certain conditions and were not previously vaccinated.  Pneumococcal polysaccharide (PPSV23) vaccine. You may need one or two doses if you smoke cigarettes or if you have certain conditions.  Meningococcal vaccine. You may need this if you have certain conditions.  Hepatitis A vaccine. You may need this if you have certain conditions or if you travel or work in places where you may be exposed to hepatitis A.  Hepatitis B vaccine. You may need this if you have certain conditions or if you travel or work in places where you may be exposed to hepatitis B.  Haemophilus influenzae type b (Hib) vaccine. You may need this if you have certain conditions. Talk to your health care provider about which screenings and vaccines you need and how often you need them. This information is not intended to replace advice given to you by your health care provider. Make sure you discuss any questions you have with your health care provider. Document Released: 11/10/2015 Document Revised: 12/04/2017 Document Reviewed: 08/15/2015 Elsevier Interactive Patient Education  2019 [employer].

## 2018-11-30 NOTE — Progress Notes (Signed)
  Subjective:     Theresa Walton is a 53 y.o. female and is here for a comprehensive physical exam. The patient reports no problems. Here s/p TVH and TVT, with recurrent UTI's last year. Placed on estradiol per vagina daily. Has recently gone to twice weekly dosing. 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   The following portions of the patient's history were reviewed and updated as appropriate: allergies, current medications, past family history, past medical history, past social history, past surgical history and problem list.  Review of Systems Pertinent items noted in HPI and remainder of comprehensive ROS otherwise negative.   Objective:    BP 99/67   Pulse 74   Wt 136 lb (61.7 kg)   BMI 24.87 kg/m   Chest - clear to auscultation, no wheezes, rales or rhonchi, symmetric air entry Breasts - bilateral implants, no palpable abnormalities otherwise General appearance: alert, cooperative and appears stated age Head: Normocephalic, without obvious abnormality, atraumatic Neck: no adenopathy, supple, symmetrical, trachea midline and thyroid not enlarged, symmetric, no tenderness/mass/nodules Heart: regular rate and rhythm, S1, S2 normal, no murmur, click, rub or gallop Abdomen: soft, non-tender; bowel sounds normal; no masses,  no organomegaly Pelvic: cervix normal in appearance, external genitalia normal, no adnexal masses or tenderness, no cervical motion tenderness, rectovaginal septum normal, uterus normal size, shape, and consistency and vagina normal without discharge Extremities: extremities normal, atraumatic, no cyanosis or edema Pulses: 2+ and symmetric Skin: Skin color, texture, turgor normal. No rashes or lesions Lymph nodes: Cervical, supraclavicular, and axillary nodes normal. Neurologic: Grossly normal    Assessment:    Healthy female exam.      Plan:   Problem List Items Addressed This Visit      Unprioritized   Menopausal and perimenopausal disorder     Continue vaginal estrogen.-no further UTI's noted, ok for 2x/wk dosing.      Relevant Medications   Estradiol (IMVEXXY MAINTENANCE PACK) 10 MCG INST    Other Visit Diagnoses    Encounter for gynecological examination without abnormal finding    -  Primary   Relevant Orders   MM 3D SCREEN BREAST BILATERAL     Return in 1 year (on 12/01/2019).    See After Visit Summary for Counseling Recommendations

## 2018-12-07 ENCOUNTER — Encounter: Payer: Self-pay | Admitting: Physician Assistant

## 2018-12-07 ENCOUNTER — Ambulatory Visit: Payer: Managed Care, Other (non HMO) | Admitting: Physician Assistant

## 2018-12-07 VITALS — BP 110/72 | HR 112 | Temp 98.0°F | Resp 16 | Wt 136.0 lb

## 2018-12-07 DIAGNOSIS — R110 Nausea: Secondary | ICD-10-CM | POA: Diagnosis not present

## 2018-12-07 DIAGNOSIS — L509 Urticaria, unspecified: Secondary | ICD-10-CM

## 2018-12-07 DIAGNOSIS — M542 Cervicalgia: Secondary | ICD-10-CM

## 2018-12-07 MED ORDER — PREDNISONE 10 MG (21) PO TBPK: ORAL_TABLET | ORAL | 0 refills | Status: DC

## 2018-12-07 NOTE — Progress Notes (Deleted)
       Patient: Theresa Walton Female    DOB: 03/12/1966   53 y.o.   MRN: [id number] Visit Date: 12/07/2018  Today's Provider: Mar Daring, PA-C   No chief complaint on file.  Subjective:     Rash  The affected locations include the back. Associated symptoms include fatigue. (Headache)    Allergies  Allergen Reactions  . Cefaclor Anaphylaxis  . Cephalosporins Anaphylaxis  . Lubiprostone     Other reaction(s): Angioedema Tongue Swelling x 2 times.  Other reaction(s): Angioedema Tongue Swelling x 2 times.   . Celecoxib   . Lac Bovis     Other reaction(s): Other (See Comments) GI Upset to milk protein  . Apple Nausea And Vomiting  . Pseudoephedrine Palpitations    Tachycardia     Current Outpatient Medications:  .  Ascorbic Acid (VITAMIN C ER PO), Vitamin C, Disp: , Rfl:  .  cyclobenzaprine (FLEXERIL) 5 MG tablet, TAKE 1 TO 2 TABLETS BY MOUTH AT BEDTIME, Disp: 30 tablet, Rfl: 5 .  EPINEPHrine (EPIPEN 2-PAK IJ), Inject as directed., Disp: , Rfl:  .  Estradiol (IMVEXXY MAINTENANCE PACK) 10 MCG INST, Place 1 tablet vaginally 2 (two) times a week., Disp: 24 each, Rfl: 3 .  Eszopiclone 3 MG TABS, TAKE 1 TABLET BY MOUTH AT BEDTIME, Disp: 30 tablet, Rfl: 0 .  glucosamine-chondroitin 500-400 MG tablet, Take 1 tablet by mouth 3 (three) times daily., Disp: , Rfl:  .  levothyroxine (SYNTHROID, LEVOTHROID) 25 MCG tablet, TAKE 1 TABLET BY MOUTH  DAILY BEFORE BREAKFAST, Disp: 90 tablet, Rfl: 1 .  valACYclovir (VALTREX) 500 MG tablet, Take 1 tablet (500 mg total) by mouth 2 (two) times daily., Disp: 180 tablet, Rfl: 1  Review of Systems  Constitutional: Positive for fatigue.  Skin: Positive for rash.    Social History   Tobacco Use  . Smoking status: Former Smoker    Types: Cigarettes    Last attempt to quit: 05/15/2015    Years since quitting: 3.5  . Smokeless tobacco: Never Used  Substance Use Topics  . Alcohol use: No    Alcohol/week: 0.0 standard drinks        Objective:   There were no vitals taken for this visit. There were no vitals filed for this visit.   Physical Exam      Ass[facility] [place], PA-C  [facility]

## 2018-12-07 NOTE — Progress Notes (Signed)
Patient: Theresa Walton Female    DOB: 1966/10/25   53 y.o.   MRN: [id number] Visit Date: 12/07/2018  Today's Provider: Mar Daring, PA-C   Chief Complaint  Patient presents with  . Rash   Subjective:   Rash  This is a new problem. The current episode started 1 to 4 weeks ago. The affected locations include the back (around her right shoulder). The rash is characterized by pain, redness and itchiness. She was exposed to nothing. Pertinent negatives include no joint pain. (Headache, fatigue and stiffness on her neck Stomach problem) Past treatments include nothing. The treatment provided no relief.     Allergies  Allergen Reactions  . Cefaclor Anaphylaxis  . Cephalosporins Anaphylaxis  . Lubiprostone     Other reaction(s): Angioedema Tongue Swelling x 2 times.  Other reaction(s): Angioedema Tongue Swelling x 2 times.   . Celecoxib   . Lac Bovis     Other reaction(s): Other (See Comments) GI Upset to milk protein  . Apple Nausea And Vomiting  . Pseudoephedrine Palpitations    Tachycardia     Current Outpatient Medications:  .  Ascorbic Acid (VITAMIN C ER PO), Vitamin C, Disp: , Rfl:  .  cyclobenzaprine (FLEXERIL) 5 MG tablet, TAKE 1 TO 2 TABLETS BY MOUTH AT BEDTIME, Disp: 30 tablet, Rfl: 5 .  EPINEPHrine (EPIPEN 2-PAK IJ), Inject as directed., Disp: , Rfl:  .  Estradiol (IMVEXXY MAINTENANCE PACK) 10 MCG INST, Place 1 tablet vaginally 2 (two) times a week., Disp: 24 each, Rfl: 3 .  Eszopiclone 3 MG TABS, TAKE 1 TABLET BY MOUTH AT BEDTIME, Disp: 30 tablet, Rfl: 0 .  glucosamine-chondroitin 500-400 MG tablet, Take 1 tablet by mouth 3 (three) times daily., Disp: , Rfl:  .  levothyroxine (SYNTHROID, LEVOTHROID) 25 MCG tablet, TAKE 1 TABLET BY MOUTH  DAILY BEFORE BREAKFAST, Disp: 90 tablet, Rfl: 1 .  valACYclovir (VALTREX) 500 MG tablet, Take 1 tablet (500 mg total) by mouth 2 (two) times daily., Disp: 180 tablet, Rfl: 1  Review of Systems  Constitutional:  Negative.   Respiratory: Negative.   Cardiovascular: Negative.   Gastrointestinal: Positive for abdominal pain and nausea (red meat per patient).  Musculoskeletal: Positive for neck stiffness. Negative for joint pain.  Skin: Positive for rash.  Neurological: Negative.     Social History   Tobacco Use  . Smoking status: Former Smoker    Types: Cigarettes    Last attempt to quit: 05/15/2015    Years since quitting: 3.5  . Smokeless tobacco: Never Used  Substance Use Topics  . Alcohol use: No    Alcohol/week: 0.0 standard drinks      Objective:   BP 110/72 (BP Location: Left Arm, Patient Position: Sitting, Cuff Size: Normal)   Pulse (!) 112   Temp 98 F (36.7 C) (Oral)   Resp 16   Wt 136 lb (61.7 kg)   SpO2 99%   BMI 24.87 kg/m  Vitals:   12/07/18 1748  BP: 110/72  Pulse: (!) 112  Resp: 16  Temp: 98 F (36.7 C)  TempSrc: Oral  SpO2: 99%  Weight: 136 lb (61.7 kg)     Physical Exam Vitals signs reviewed.  Constitutional:      General: She is not in acute distress.    Appearance: She is well-developed and normal weight. She is not diaphoretic.  Neck:     Musculoskeletal: Normal range of motion and neck supple.     Thyroid: No  thyromegaly.     Vascular: No JVD.     Trachea: No tracheal deviation.  Cardiovascular:     Rate and Rhythm: Normal rate and regular rhythm.     Heart sounds: Normal heart sounds. No murmur. No friction rub. No gallop.   Pulmonary:     Effort: Pulmonary effort is normal. No respiratory distress.     Breath sounds: Normal breath sounds. No wheezing or rales.  Lymphadenopathy:     Cervical: No cervical adenopathy.  Neurological:     Mental Status: She is alert.         Assessment & Plan    1. Urticaria Will give steroid taper as noted below for rash. Possible allergic response to unknown trigger. Patient requesting labs to r/o other causes and see if she has an autoimmune issue causing this or a tick borne illness. Also reports  she has had aversion for a while to red meat when she used to not. I will follow up pending results.  - predniSONE (STERAPRED UNI-PAK 21 TAB) 10 MG (21) TBPK tablet; 6 day taper; take as directed on package instructions  Dispense: 21 tablet; Refill: 0 - CBC w/Diff/Platelet - Comprehensive Metabolic Panel (CMET) - TSH - Alpha-Gal Panel - ANA,IFA RA Diag Pnl w/rflx Tit/Patn - Sed Rate (ESR) - B. burgdorfi antibodies - Rocky mtn spotted fvr abs pnl(IgG+IgM)  2. Nausea See above medical treatment plan. - CBC w/Diff/Platelet - Comprehensive Metabolic Panel (CMET) - TSH - Alpha-Gal Panel - ANA,IFA RA Diag Pnl w/rflx Tit/Patn - Sed Rate (ESR) - B. burgdorfi antibodies - Rocky mtn spotted fvr abs pnl(IgG+IgM)  3. Neck pain See above medical treatment plan. - CBC w/Diff/Platelet - Comprehensive Metabolic Panel (CMET) - TSH - Alpha-Gal Panel - ANA,IFA RA Diag Pnl w/rflx Tit/Patn - Sed Rate (ESR) - B. burgdorfi antibodies - Rocky mtn spotted fvr abs pnl(IgG+IgM)     Mar Daring, PA-C  [facility]

## 2018-12-07 NOTE — Patient Instructions (Signed)
Hives  Hives (urticaria) are itchy, red, swollen areas on the skin. Hives can appear on any part of the body. Hives often fade within 24 hours (acute hives). Sometimes, new hives appear after old ones fade and the cycle can continue for several days or weeks (chronic hives). Hives do not spread from person to person (are not contagious).  Hives come from the body's reaction to something a person is allergic to (allergen), something that causes irritation, or various other triggers. When a person is exposed to a trigger, his or her body releases a chemical (histamine) that causes redness, itching, and swelling. Hives can appear right after exposure to a trigger or hours later.  What are the causes?  This condition may be caused by:  · Allergies to foods or ingredients.  · Insect bites or stings.  · Exposure to pollen or pets.  · Contact with latex or chemicals.  · Spending time in sunlight, heat, or cold (exposure).  · Exercise.  · Stress.  · Certain medicines.  You can also get hives from other medical conditions and treatments, such as:  · Viruses, including the common cold.  · Bacterial infections, such as urinary tract infections and strep throat.  · Certain medicines.  · Allergy shots.  · Blood transfusions.  Sometimes, the cause of this condition is not known (idiopathic hives).  What increases the risk?  You are more likely to develop this condition if you:  · Are a woman.  · Have food allergies, especially to citrus fruits, milk, eggs, peanuts, tree nuts, or shellfish.  · Are allergic to:  ? Medicines.  ? Latex.  ? Insects.  ? Animals.  ? Pollen.  What are the signs or symptoms?  Common symptoms of this condition include raised, itchy, red or white bumps or patches on your skin. These areas may:  · Become large and swollen (welts).  · Change in shape and location, quickly and repeatedly.  · Be separate hives or connect over a large area of skin.  · Sting or become painful.  · Turn white when pressed in the  center (blanch).  In severe cases, your hands, feet, and face may also become swollen. This may occur if hives develop deeper in your skin.  How is this diagnosed?  This condition may be diagnosed by your symptoms, medical history, and physical exam.  · Your skin, urine, or blood may be tested to find out what is causing your hives and to rule out other health issues.  · Your health care provider may also remove a small sample of skin from the affected area and examine it under a microscope (biopsy).  How is this treated?  Treatment for this condition depends on the cause and severity of your symptoms. Your health care provider may recommend using cool, wet cloths (cool compresses) or taking cool showers to relieve itching. Treatment may include:  · Medicines that help:  ? Relieve itching (antihistamines).  ? Reduce swelling (corticosteroids).  ? Treat infection (antibiotics).  · An injectable medicine (omalizumab). Your health care provider may prescribe this if you have chronic idiopathic hives and you continue to have symptoms even after treatment with antihistamines.  Severe cases may require an emergency injection of adrenaline (epinephrine) to prevent a life-threatening allergic reaction (anaphylaxis).  Follow these instructions at home:  Medicines  · Take and apply over-the-counter and prescription medicines only as told by your health care provider.  · If you were prescribed an antibiotic medicine, take   it as told by your health care provider. Do not stop using the antibiotic even if you start to feel better.  Skin care  · Apply cool compresses to the affected areas.  · Do not scratch or rub your skin.  General instructions  · Do not take hot showers or baths. This can make itching worse.  · Do not wear tight-fitting clothing.  · Use sunscreen and wear protective clothing when you are outside.  · Avoid any substances that cause your hives. Keep a journal to help track what causes your hives. Write  down:  ? What medicines you take.  ? What you eat and drink.  ? What products you use on your skin.  · Keep all follow-up visits as told by your health care provider. This is important.  Contact a health care provider if:  · Your symptoms are not controlled with medicine.  · Your joints are painful or swollen.  Get help right away if:  · You have a fever.  · You have pain in your abdomen.  · Your tongue or lips are swollen.  · Your eyelids are swollen.  · Your chest or throat feels tight.  · You have trouble breathing or swallowing.  These symptoms may represent a serious problem that is an emergency. Do not wait to see if the symptoms will go away. Get medical help right away. Call your local emergency services (911 in the [place] Do not drive yourself to the hospital.  Summary  · Hives (urticaria) are itchy, red, swollen areas on your skin. Hives come from the body's reaction to something a person is allergic to (allergen), something that causes irritation, or various other triggers.  · Treatment for this condition depends on the cause and severity of your symptoms.  · Avoid any substances that cause your hives. Keep a journal to help track what causes your hives.  · Take and apply over-the-counter and prescription medicines only as told by your health care provider.  · Keep all follow-up visits as told by your health care provider. This is important.  This information is not intended to replace advice given to you by your health care provider. Make sure you discuss any questions you have with your health care provider.  Document Released: 10/14/2005 Document Revised: 04/29/2018 Document Reviewed: 04/29/2018  [employer] Interactive Patient Education © 2019 [employer]

## 2018-12-08 ENCOUNTER — Other Ambulatory Visit: Payer: Self-pay | Admitting: Family Medicine

## 2018-12-08 DIAGNOSIS — Z1231 Encounter for screening mammogram for malignant neoplasm of breast: Secondary | ICD-10-CM

## 2018-12-10 ENCOUNTER — Encounter: Payer: Self-pay | Admitting: Internal Medicine

## 2018-12-10 ENCOUNTER — Ambulatory Visit: Payer: Managed Care, Other (non HMO) | Admitting: Internal Medicine

## 2018-12-10 VITALS — BP 108/70 | HR 71 | Resp 16 | Ht 62.0 in | Wt 135.0 lb

## 2018-12-10 DIAGNOSIS — J309 Allergic rhinitis, unspecified: Secondary | ICD-10-CM

## 2018-12-10 DIAGNOSIS — G4700 Insomnia, unspecified: Secondary | ICD-10-CM

## 2018-12-10 DIAGNOSIS — Z836 Family history of other diseases of the respiratory system: Secondary | ICD-10-CM | POA: Diagnosis not present

## 2018-12-10 MED ORDER — TRAZODONE HCL 50 MG PO TABS: 25.0000 mg | ORAL_TABLET | Freq: Every evening | ORAL | 1 refills | Status: DC | PRN

## 2018-12-10 NOTE — Progress Notes (Signed)
[facility] [place], [place] [address]  Pulmonary Sleep Medicine   Office Visit Note  Patient Name: Theresa Walton DOB: November 20, 1965 MRN [id number]  Date of Service: 12/10/2018  Complaints/HPI: Pt is here for follow up on insomnia.  Patient reports she continues to have issues with sleep.  Typically she can fall asleep using melatonin and her Johnnye Sima however she reports waking up multiple times a night.  She states that this is an ongoing issue and has been present since her teen years.  Multiple family members also have the same issue.  She is used [place], Ambien, Risperdal and other medications in the past.  ROS  General: (-) fever, (-) chills, (-) night sweats, (-) weakness Skin: (-) rashes, (-) itching,. Eyes: (-) visual changes, (-) redness, (-) itching. Nose and Sinuses: (-) nasal stuffiness or itchiness, (-) postnasal drip, (-) nosebleeds, (-) sinus trouble. Mouth and Throat: (-) sore throat, (-) hoarseness. Neck: (-) swollen glands, (-) enlarged thyroid, (-) neck pain. Respiratory: - cough, (-) bloody sputum, - shortness of breath, - wheezing. Cardiovascular: - ankle swelling, (-) chest pain. Lymphatic: (-) lymph node enlargement. Neurologic: (-) numbness, (-) tingling. Psychiatric: (-) anxiety, (-) depression   Current Medication: Outpatient Encounter Medications as of 12/10/2018  Medication Sig  . Ascorbic Acid (VITAMIN C ER PO) Vitamin C  . Azelastine-Fluticasone (DYMISTA) 137-50 MCG/ACT SUSP Place into the nose.  . B Complex Vitamins (VITAMIN B COMPLEX) TABS Take by mouth.  . Cholecalciferol (VITAMIN D) 50 MCG (2000 UT) CAPS Take by mouth.  . cyclobenzaprine (FLEXERIL) 5 MG tablet TAKE 1 TO 2 TABLETS BY MOUTH AT BEDTIME  . EPINEPHrine (EPIPEN 2-PAK IJ) Inject as directed.  . Estradiol (IMVEXXY MAINTENANCE PACK) 10 MCG INST Place 1 tablet vaginally 2 (two) times a week.  . Eszopiclone 3 MG TABS TAKE 1 TABLET BY MOUTH AT BEDTIME  .  glucosamine-chondroitin 500-400 MG tablet Take 1 tablet by mouth 3 (three) times daily.  Marland Kitchen levothyroxine (SYNTHROID, LEVOTHROID) 25 MCG tablet TAKE 1 TABLET BY MOUTH  DAILY BEFORE BREAKFAST  . montelukast (SINGULAIR) 10 MG tablet Take 10 mg by mouth at bedtime.  . predniSONE (STERAPRED UNI-PAK 21 TAB) 10 MG (21) TBPK tablet 6 day taper; take as directed on package instructions  . valACYclovir (VALTREX) 500 MG tablet Take 1 tablet (500 mg total) by mouth 2 (two) times daily.  . traZODone (DESYREL) 50 MG tablet Take 0.5-1 tablets (25-50 mg total) by mouth at bedtime as needed for sleep.   No facility-administered encounter medications on file as of 12/10/2018.     Surgical History: Past Surgical History:  Procedure Laterality Date  . ABDOMINAL HYSTERECTOMY    . ANTERIOR CRUCIATE LIGAMENT REPAIR Right 2012  . ARTHROSCOPIC REPAIR ACL  2013   Right knee  . AUGMENTATION MAMMAPLASTY Bilateral 2000   saline  . BLADDER REPAIR    . BREAST ENHANCEMENT SURGERY  1995  . COLONOSCOPY WITH PROPOFOL N/A 08/29/2017   Procedure: COLONOSCOPY WITH PROPOFOL;  Surgeon: Jonathon Bellows, MD;  Location: [facility] ENDOSCOPY;  Service: Gastroenterology;  Laterality: N/A;  . CYSTOCELE REPAIR  2013  . ENDOMETRIAL ABLATION  2013  . [place], 2001  . PARTIAL HYSTERECTOMY  2015  . PARTIAL HYSTERECTOMY Bilateral 2014  . RECTOPEXY  2011  . TONSILECTOMY, ADENOIDECTOMY, BILATERAL MYRINGOTOMY AND TUBES  1984  . TOOTH EXTRACTION      Medical History: Past Medical History:  Diagnosis Date  . Basal cell carcinoma (BCC) of  jawline 06/26/2018  . 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[place])   . Genital herpes   . Hypothyroidism   . Seasonal allergies   . Syncope and collapse   . Thyroid disease    hypothyroidism    Family History: Family History  Problem Relation Age of Onset  . Pulmonary fibrosis Mother   . Healthy Sister   . Healthy Brother   . Healthy Sister   . Lung cancer Paternal Grandmother   .  Arrhythmia Father   . Heart disease Father   . Prostate cancer Neg Hx   . Bladder Cancer Neg Hx   . Kidney cancer Neg Hx   . Breast cancer Neg Hx     Social History: Social History   Socioeconomic History  . Marital status: Divorced    Spouse name: Not on file  . Number of children: Not on file  . Years of education: Not on file  . Highest education level: Not on file  Occupational History  . Not on file  Social Needs  . Financial resource strain: Not on file  . Food insecurity:    Worry: Not on file    Inability: Not on file  . Transportation needs:    Medical: Not on file    Non-medical: Not on file  Tobacco Use  . Smoking status: Former Smoker    Types: Cigarettes    Last attempt to quit: 05/15/2015    Years since quitting: 3.5  . Smokeless tobacco: Never Used  Substance and Sexual Activity  . Alcohol use: No    Alcohol/week: 0.0 standard drinks  . Drug use: No  . Sexual activity: Never    Partners: Male    Birth control/protection: Surgical  Lifestyle  . Physical activity:    Days per week: Not on file    Minutes per session: Not on file  . Stress: Not on file  Relationships  . Social connections:    Talks on phone: Not on file    Gets together: Not on file    Attends religious service: Not on file    Active member of club or organization: Not on file    Attends meetings of clubs or organizations: Not on file    Relationship status: Not on file  . Intimate partner violence:    Fear of current or ex partner: Not on file    Emotionally abused: Not on file    Physically abused: Not on file    Forced sexual activity: Not on file  Other Topics Concern  . Not on file  Social History Narrative  . Not on file    Vital Signs: Blood pressure 108/70, pulse 71, resp. rate 16, height 5' 2" (1.575 m), weight 135 lb (61.2 kg), SpO2 99 %.  Examination: General Appearance: The patient is well-developed, well-nourished, and in no distress. Skin: Gross inspection  of skin unremarkable. Head: normocephalic, no gross deformities. Eyes: no gross deformities noted. ENT: ears appear grossly normal no exudates. Neck: Supple. No thyromegaly. No LAD. Respiratory: clear bilaterally. Cardiovascular: Normal S1 and S2 without murmur or rub. Extremities: No cyanosis. pulses are equal. Neurologic: Alert and oriented. No involuntary movements.  LABS: Recent Results (from the past 2160 hour(s))  CBC w/Diff/Platelet     Status: None   Collection Time: 12/08/18  9:30 AM  Result Value Ref Range   WBC 5.2 3.4 - 10.8 x10E3/uL   RBC 4.38 3.77 - 5.28 x10E6/uL   Hemoglobin 13.3 11.1 - 15.9 g/dL   Hematocrit  39.7 34.0 - 46.6 %   MCV 91 79 - 97 fL   MCH 30.4 26.6 - 33.0 pg   MCHC 33.5 31.5 - 35.7 g/dL   RDW 12.8 11.7 - 15.4 %   Platelets 302 150 - 450 x10E3/uL   Neutrophils 50 Not Estab. %   Lymphs 39 Not Estab. %   Monocytes 9 Not Estab. %   Eos 1 Not Estab. %   Basos 1 Not Estab. %   Neutrophils Absolute 2.6 1.4 - 7.0 x10E3/uL   Lymphocytes Absolute 2.0 0.7 - 3.1 x10E3/uL   Monocytes Absolute 0.5 0.1 - 0.9 x10E3/uL   EOS (ABSOLUTE) 0.1 0.0 - 0.4 x10E3/uL   Basophils Absolute 0.0 0.0 - 0.2 x10E3/uL   Immature Granulocytes 0 Not Estab. %   Immature Grans (Abs) 0.0 0.0 - 0.1 x10E3/uL  Comprehensive Metabolic Panel (CMET)     Status: None   Collection Time: 12/08/18  9:30 AM  Result Value Ref Range   Glucose 79 65 - 99 mg/dL   BUN 6 6 - 24 mg/dL   Creatinine, Ser 0.60 0.57 - 1.00 mg/dL   GFR calc non Af Amer 104 >59 mL/min/1.73   GFR calc Af Amer 120 >59 mL/min/1.73   BUN/Creatinine Ratio 10 9 - 23   Sodium 139 134 - 144 mmol/L   Potassium 4.1 3.5 - 5.2 mmol/L   Chloride 102 96 - 106 mmol/L   CO2 22 20 - 29 mmol/L   Calcium 9.5 8.7 - 10.2 mg/dL   Total Protein 6.4 6.0 - 8.5 g/dL   Albumin 4.3 3.8 - 4.9 g/dL    Comment:               **Please note reference interval change**   Globulin, Total 2.1 1.5 - 4.5 g/dL   Albumin/Globulin Ratio 2.0 1.2 - 2.2    Bilirubin Total 0.4 0.0 - 1.2 mg/dL   Alkaline Phosphatase 61 39 - 117 IU/L   AST 18 0 - 40 IU/L   ALT 20 0 - 32 IU/L  TSH     Status: None   Collection Time: 12/08/18  9:30 AM  Result Value Ref Range   TSH 4.460 0.450 - 4.500 uIU/mL  Alpha-Gal Panel     Status: None (Preliminary result)   Collection Time: 12/08/18  9:30 AM  Result Value Ref Range   Beef (Bos spp) IgE Comment kU/L    Comment: We have received your specimen and it has been forwarded to another laboratory for testing. Results will be forwarded to you as soon as possible.    Class Interpretation WILL FOLLOW    Lamb/Mutton (Ovis spp) IgE WILL FOLLOW    Class Interpretation WILL FOLLOW    Pork (Sus spp) IgE WILL FOLLOW    Class Interpretation WILL FOLLOW    Alpha Gal IgE* WILL FOLLOW   ANA,IFA RA Diag Pnl w/rflx Tit/Patn     Status: None   Collection Time: 12/08/18  9:30 AM  Result Value Ref Range   ANA Titer 1 Negative     Comment:                                      Negative   <1:80  Borderline  1:80                                      Positive   >1:80    Rhuematoid fact SerPl-aCnc <10.0 0.0 - 63.8 IU/mL   Cyclic Citrullin Peptide Ab 9 0 - 19 units    Comment:                           Negative               <20                           Weak positive      20 - 39                           Moderate positive  40 - 59                           Strong positive        >59   Sed Rate (ESR)     Status: None   Collection Time: 12/08/18  9:30 AM  Result Value Ref Range   Sed Rate 2 0 - 40 mm/hr  B. burgdorfi antibodies     Status: None   Collection Time: 12/08/18  9:30 AM  Result Value Ref Range   Lyme IgG/IgM Ab <0.91 0.00 - 0.90 ISR    Comment:                                 Negative         <0.91                                 Equivocal  0.91 - 1.09                                 Positive         >1.09   Rocky mtn spotted fvr abs pnl(IgG+IgM)     Status: None    Collection Time: 12/08/18  9:30 AM  Result Value Ref Range   RMSF IgG Negative Negative   RMSF IgM 0.33 0.00 - 0.89 index    Comment:                                  Negative        <0.90                                  Equivocal 0.90 - 1.10                                  Positive        >1.10     Radiology: Dg Chest 2 View  Result Date: 05/13/2018 CLINICAL DATA:  Shortness of breath, connective tissue disorder, former smoker, family  history of pulmonary fibrosis EXAM: CHEST - 2 VIEW COMPARISON:  None FINDINGS: Normal heart size, mediastinal contours, and pulmonary vascularity. Lungs minimally hyperinflated but clear. No infiltrate, pleural effusion, or pneumothorax. Bones unremarkable. BILATERAL breast prostheses noted. IMPRESSION: Minimal hyperinflation without infiltrate. Electronically Signed   By: Lavonia Dana M.D.   On: 05/13/2018 08:44    No results found.  No results found.    Assessment and Plan: Patient Active Problem List   Diagnosis Date Noted  . Edema 08/05/2018  . Impingement syndrome of shoulder region 08/05/2018  . Knee joint effusion 08/05/2018  . Old anterior cruciate ligament disruption 08/05/2018  . Patellar tendonitis 08/05/2018  . Basal cell carcinoma (BCC) of jawline 06/26/2018  . History of pubovaginal sling 04/01/2017  . Essential tremor 05/15/2016  . Allergic rhinitis 06/22/2015  . B12 deficiency 06/21/2015  . H/O: hysterectomy 06/21/2015  . Hypertriglyceridemia 06/21/2015  . Hypoglycemia 06/21/2015  . Menopausal and perimenopausal disorder 06/21/2015  . Vasovagal symptom 06/21/2015  . Insomnia 04/19/2015  . Female stress incontinence 03/24/2012  . Adult hypothyroidism 08/28/2009  . Avitaminosis D 08/28/2009  . Chronic infection of sinus 02/01/2009  . Adaptation reaction 09/09/2008  . Genital herpes simplex type 1 infection 07/13/2008    1. Insomnia, unspecified type Patient given trazodone for sleep.  Instructed patient she may take a  half or whole tablet and see if this will work for her.  She will let us know in a few weeks what her results are.  We are agreeable to continue the Lunesta if this does not work.  Patient was instructed not to take both at the same time. - traZODone (DESYREL) 50 MG tablet; Take 0.5-1 tablets (25-50 mg total) by mouth at bedtime as needed for sleep.  Dispense: 30 tablet; Refill: 1  2. Allergic rhinitis, unspecified seasonality, unspecified trigger Stable, patient continues to take OTC allergy medications as needed.  3. Family history of pulmonary fibrosis Most recent chest x-ray shows minimal hyperinflation without infiltrate.  No signs of pulmonary fibrosis and patient.  Periodically will review based on family history.  General Counseling: I have discussed the findings of the evaluation and examination with Theresa Walton.  I have also discussed any further diagnostic evaluation thatmay be needed or ordered today. Theresa Walton verbalizes understanding of the findings of todays visit. We also reviewed her medications today and discussed drug interactions and side effects including but not limited excessive drowsiness and altered mental states. We also discussed that there is always a risk not just to her but also people around her. she has been encouraged to call the office with any questions or concerns that should arise related to todays visit.    Time spent: 25 This patient was seen by Orson Gear AGNP-C in Collaboration with Dr. Devona Konig as a part of collaborative care agreement.   I have personally obtained a history, examined the patient, evaluated laboratory and imaging results, formulated the assessment and plan and placed orders.    Allyne Gee, MD [facility] Pulmonary and Critical Care Sleep medicine

## 2018-12-10 NOTE — Patient Instructions (Signed)

## 2018-12-11 ENCOUNTER — Telehealth: Payer: Self-pay

## 2018-12-11 NOTE — Telephone Encounter (Signed)
Viewed by [name] on 12/10/2018 3:15 PM

## 2018-12-11 NOTE — Telephone Encounter (Signed)
LVMTRC 

## 2018-12-11 NOTE — Telephone Encounter (Signed)
-----   Message from Mar Daring, [place] sent at 12/10/2018  2:54 PM EST ----- Blood count is normal. Kidney and liver function normal. Sugar normal. Thyroid normal. Autoimmune sources negative including lupus and RF. Inflammatory markers are negative. Lyme negative. RMSF negative. Alpha gal is still pending as it is a send off lab and may take 2 weeks before resulted. Will inform once we get them back.

## 2018-12-13 NOTE — Addendum Note (Signed)
Addended by: Devona Konig on: 12/13/2018 10:00 PM   Modules accepted: Level of Service

## 2018-12-14 ENCOUNTER — Telehealth: Payer: Self-pay

## 2018-12-14 LAB — ALPHA-GAL PANEL
Alpha Gal IgE*: <0.1 kU/L (ref <0.10)
Beef (Bos spp) IgE: <0.1 kU/L (ref <0.35)
Class Interpretation: 0
Class Interpretation: 0
Class Interpretation: 0
Lamb/Mutton (Ovis spp) IgE: <0.1 kU/L (ref <0.35)
Pork (Sus spp) IgE: <0.1 kU/L (ref <0.35)

## 2018-12-14 LAB — COMPREHENSIVE METABOLIC PANEL
ALT: 20 IU/L (ref 0–32)
AST: 18 IU/L (ref 0–40)
Albumin/Globulin Ratio: 2 (ref 1.2–2.2)
Albumin: 4.3 g/dL (ref 3.8–4.9)
Alkaline Phosphatase: 61 IU/L (ref 39–117)
BUN/Creatinine Ratio: 10 (ref 9–23)
BUN: 6 mg/dL (ref 6–24)
Bilirubin Total: 0.4 mg/dL (ref 0.0–1.2)
CO2: 22 mmol/L (ref 20–29)
Calcium: 9.5 mg/dL (ref 8.7–10.2)
Chloride: 102 mmol/L (ref 96–106)
Creatinine, Ser: 0.6 mg/dL (ref 0.57–1.00)
GFR calc Af Amer: 120 mL/min/{1.73_m2} (ref >59)
GFR calc non Af Amer: 104 mL/min/{1.73_m2} (ref >59)
Globulin, Total: 2.1 g/dL (ref 1.5–4.5)
Glucose: 79 mg/dL (ref 65–99)
Potassium: 4.1 mmol/L (ref 3.5–5.2)
Sodium: 139 mmol/L (ref 134–144)
Total Protein: 6.4 g/dL (ref 6.0–8.5)

## 2018-12-14 LAB — CBC WITH DIFFERENTIAL/PLATELET
Basophils Absolute: 0 10*3/uL (ref 0.0–0.2)
Basos: 1 %
EOS (ABSOLUTE): 0.1 10*3/uL (ref 0.0–0.4)
Eos: 1 %
Hematocrit: 39.7 % (ref 34.0–46.6)
Hemoglobin: 13.3 g/dL (ref 11.1–15.9)
Immature Grans (Abs): 0 10*3/uL (ref 0.0–0.1)
Immature Granulocytes: 0 %
Lymphocytes Absolute: 2 10*3/uL (ref 0.7–3.1)
Lymphs: 39 %
MCH: 30.4 pg (ref 26.6–33.0)
MCHC: 33.5 g/dL (ref 31.5–35.7)
MCV: 91 fL (ref 79–97)
Monocytes Absolute: 0.5 10*3/uL (ref 0.1–0.9)
Monocytes: 9 %
Neutrophils Absolute: 2.6 10*3/uL (ref 1.4–7.0)
Neutrophils: 50 %
Platelets: 302 10*3/uL (ref 150–450)
RBC: 4.38 x10E6/uL (ref 3.77–5.28)
RDW: 12.8 % (ref 11.7–15.4)
WBC: 5.2 10*3/uL (ref 3.4–10.8)

## 2018-12-14 LAB — TSH: TSH: 4.46 u[IU]/mL (ref 0.450–4.500)

## 2018-12-14 LAB — ANA,IFA RA DIAG PNL W/RFLX TIT/PATN
ANA Titer 1: NEGATIVE
Cyclic Citrullin Peptide Ab: 9 units (ref 0–19)
Rhuematoid fact SerPl-aCnc: <10 IU/mL (ref 0.0–13.9)

## 2018-12-14 LAB — ROCKY MTN SPOTTED FVR ABS PNL(IGG+IGM)
RMSF IgG: NEGATIVE
RMSF IgM: 0.33 index (ref 0.00–0.89)

## 2018-12-14 LAB — B. BURGDORFI ANTIBODIES: Lyme IgG/IgM Ab: <0.91 {ISR} (ref 0.00–0.90)

## 2018-12-14 LAB — SEDIMENTATION RATE: Sed Rate: 2 mm/hr (ref 0–40)

## 2018-12-14 NOTE — Telephone Encounter (Signed)
LMTCB or to view results through my chart 

## 2018-12-14 NOTE — Telephone Encounter (Signed)
-----   Message from Mar Daring, [place] sent at 12/14/2018  6:02 PM EST ----- Alpha gal is negative.

## 2018-12-15 ENCOUNTER — Other Ambulatory Visit: Payer: Self-pay | Admitting: Adult Health

## 2018-12-15 ENCOUNTER — Telehealth: Payer: Self-pay | Admitting: Adult Health

## 2018-12-15 ENCOUNTER — Encounter: Payer: Self-pay | Admitting: Internal Medicine

## 2018-12-15 MED ORDER — ESZOPICLONE 3 MG PO TABS: 3.0000 mg | ORAL_TABLET | Freq: Every day | ORAL | 0 refills | Status: DC

## 2018-12-15 NOTE — Telephone Encounter (Signed)
-----   Message from Kendell Bane, NP sent at 12/15/2018  1:01 PM EST ----- RX for Lunesta sent (refill). She sent me a request through epic.  Please let her know it was sent.

## 2018-12-15 NOTE — Telephone Encounter (Signed)
I have not seen this guy but got this message.

## 2018-12-15 NOTE — Progress Notes (Signed)
Pts RX for Lunesta refilled at this time.  Samples of Belsomar not working.

## 2018-12-15 NOTE — Telephone Encounter (Signed)
Pt last seen 12/10/2018

## 2018-12-15 NOTE — Telephone Encounter (Signed)
Medication refilled , patient notified left message for her

## 2018-12-16 NOTE — Telephone Encounter (Signed)
Got this and this is pulmonology only.

## 2018-12-16 NOTE — Telephone Encounter (Signed)
Patient advised as directed below. 

## 2018-12-21 ENCOUNTER — Telehealth: Payer: Self-pay | Admitting: Physician Assistant

## 2018-12-21 ENCOUNTER — Encounter: Payer: Self-pay | Admitting: Physician Assistant

## 2018-12-21 DIAGNOSIS — R21 Rash and other nonspecific skin eruption: Secondary | ICD-10-CM

## 2018-12-21 NOTE — Telephone Encounter (Signed)
Referral placed.

## 2018-12-21 NOTE — Telephone Encounter (Signed)
Pt was in about 2 weeks ago for a rash on her back.  She would like a referral to a dermatologist here in [place].  The rash has not gone a way  PT'S CB#  [phone]  Thanks Con Memos

## 2019-01-06 ENCOUNTER — Encounter: Payer: Self-pay | Admitting: Physician Assistant

## 2019-01-06 DIAGNOSIS — Z23 Encounter for immunization: Secondary | ICD-10-CM

## 2019-01-07 MED ORDER — PNEUMOCOCCAL VAC POLYVALENT 25 MCG/0.5ML IJ INJ: 0.5000 mL | INJECTION | Freq: Once | INTRAMUSCULAR | 0 refills | Status: AC

## 2019-01-07 NOTE — Addendum Note (Signed)
Addended by: Mar Daring on: 01/07/2019 01:50 PM   Modules accepted: Orders

## 2019-01-10 ENCOUNTER — Other Ambulatory Visit: Payer: Self-pay | Admitting: Adult Health

## 2019-01-11 NOTE — Telephone Encounter (Signed)
LAST APPT: 12/10/2018 AND NEXT APPT: 04/12/2019

## 2019-01-24 ENCOUNTER — Other Ambulatory Visit: Payer: Self-pay | Admitting: Physician Assistant

## 2019-02-09 ENCOUNTER — Encounter: Payer: Self-pay | Admitting: Physician Assistant

## 2019-03-04 NOTE — Progress Notes (Signed)
Patient: Theresa Walton Female    DOB: [birth year]   53 y.o.   MRN: [id number] Visit Date: 03/05/2019  Today's Provider: Mar Daring, PA-C   Chief Complaint  Patient presents with  . Adenopathy   Subjective:     HPI  Patient here today with c/o swollen glands. Reports no sore throat, SOB,cough or fever. No trouble swallowing. Does have chronic sinusitis and is on allergy injections at this time.  Allergies  Allergen Reactions  . Cefaclor Anaphylaxis  . Cephalosporins Anaphylaxis  . Lubiprostone     Other reaction(s): Angioedema Tongue Swelling x 2 times.  Other reaction(s): Angioedema Tongue Swelling x 2 times.   . Celecoxib   . Lac Bovis     Other reaction(s): Other (See Comments) GI Upset to milk protein  . Apple Nausea And Vomiting  . Pseudoephedrine Palpitations    Tachycardia     Current Outpatient Medications:  .  Ascorbic Acid (VITAMIN C ER PO), Vitamin C, Disp: , Rfl:  .  B Complex Vitamins (VITAMIN B COMPLEX) TABS, Take by mouth., Disp: , Rfl:  .  Cholecalciferol (VITAMIN D) 50 MCG (2000 UT) CAPS, Take by mouth., Disp: , Rfl:  .  cyclobenzaprine (FLEXERIL) 5 MG tablet, TAKE 1 TO 2 TABLETS BY MOUTH AT BEDTIME, Disp: 30 tablet, Rfl: 5 .  EPINEPHrine (EPIPEN 2-PAK IJ), Inject as directed., Disp: , Rfl:  .  Estradiol (IMVEXXY MAINTENANCE PACK) 10 MCG INST, Place 1 tablet vaginally 2 (two) times a week., Disp: 24 each, Rfl: 3 .  Eszopiclone 3 MG TABS, TAKE 1 TABLET BY MOUTH IMMEDIATELY BEFORE BEDTIME, Disp: 30 tablet, Rfl: 2 .  glucosamine-chondroitin 500-400 MG tablet, Take 1 tablet by mouth 3 (three) times daily., Disp: , Rfl:  .  levothyroxine (SYNTHROID, LEVOTHROID) 25 MCG tablet, TAKE ONE TABLET BY MOUTH BEFORE BREAKFAST, Disp: 90 tablet, Rfl: 1 .  montelukast (SINGULAIR) 10 MG tablet, Take 10 mg by mouth at bedtime., Disp: , Rfl:  .  valACYclovir (VALTREX) 500 MG tablet, Take 1 tablet (500 mg total) by mouth 2 (two) times daily., Disp: 180  tablet, Rfl: 1  Review of Systems  Constitutional: Negative.   HENT: Negative.   Respiratory: Negative.   Cardiovascular: Negative.   Gastrointestinal: Negative.   Neurological: Negative.   Hematological: Positive for adenopathy.    Social History   Tobacco Use  . Smoking status: Former Smoker    Types: Cigarettes    Last attempt to quit: 05/15/2015    Years since quitting: 3.8  . Smokeless tobacco: Never Used  Substance Use Topics  . Alcohol use: No    Alcohol/week: 0.0 standard drinks      Objective:   BP 104/69 (BP Location: Left Arm, Patient Position: Sitting, Cuff Size: Large)   Pulse 82   Temp 98.3 F (36.8 C) (Oral)   Resp 16   Wt 132 lb 6.4 oz (60.1 kg)   BMI 24.22 kg/m  Vitals:   03/05/19 1042  BP: 104/69  Pulse: 82  Resp: 16  Temp: 98.3 F (36.8 C)  TempSrc: Oral  Weight: 132 lb 6.4 oz (60.1 kg)     Physical Exam Vitals signs reviewed.  Constitutional:      General: She is not in acute distress.    Appearance: Normal appearance. She is well-developed and normal weight. She is not ill-appearing or diaphoretic.  HENT:     Head: Normocephalic and atraumatic.     Right Ear: Hearing, ear canal  and external ear normal. A middle ear effusion is present. Tympanic membrane is erythematous and bulging. Tympanic membrane is not perforated.     Left Ear: Hearing, tympanic membrane, ear canal and external ear normal.     Nose: Nose normal.     Mouth/Throat:     Mouth: Mucous membranes are moist.     Pharynx: Uvula midline. No oropharyngeal exudate.  Eyes:     General: No scleral icterus.       Right eye: No discharge.        Left eye: No discharge.     Conjunctiva/sclera: Conjunctivae normal.     Pupils: Pupils are equal, round, and reactive to light.  Neck:     Musculoskeletal: Normal range of motion and neck supple.     Thyroid: No thyromegaly.     Trachea: No tracheal deviation.  Cardiovascular:     Rate and Rhythm: Normal rate and regular rhythm.      Heart sounds: Normal heart sounds. No murmur. No friction rub. No gallop.   Pulmonary:     Effort: Pulmonary effort is normal. No respiratory distress.     Breath sounds: Normal breath sounds. No stridor. No wheezing or rales.  Lymphadenopathy:     Cervical: Cervical adenopathy present.     Right cervical: Superficial cervical adenopathy present.     Left cervical: Superficial cervical adenopathy present.  Skin:    General: Skin is warm and dry.  Neurological:     Mental Status: She is alert.         Assessment & Plan    1. Non-recurrent acute suppurative otitis media of right ear without spontaneous rupture of tympanic membrane Worsening symptoms that have not responded to OTC medications. Will give amoxicillin as below. Continue allergy medications. May use tylenol for pain/fever. Warm compresses on swollen lymph nodes. Stay well hydrated and get plenty of rest. Call if no symptom improvement or if symptoms worsen. - amoxicillin (AMOXIL) 875 MG tablet; Take 1 tablet (875 mg total) by mouth 2 (two) times daily.  Dispense: 20 tablet; Refill: 0     Mar Daring, PA-C  [facility]

## 2019-03-05 ENCOUNTER — Ambulatory Visit: Payer: Managed Care, Other (non HMO) | Admitting: Physician Assistant

## 2019-03-05 ENCOUNTER — Other Ambulatory Visit: Payer: Self-pay

## 2019-03-05 ENCOUNTER — Encounter: Payer: Self-pay | Admitting: Physician Assistant

## 2019-03-05 ENCOUNTER — Encounter: Payer: Managed Care, Other (non HMO) | Admitting: Physician Assistant

## 2019-03-05 VITALS — BP 104/69 | HR 82 | Temp 98.3°F | Resp 16 | Wt 132.4 lb

## 2019-03-05 DIAGNOSIS — H66001 Acute suppurative otitis media without spontaneous rupture of ear drum, right ear: Secondary | ICD-10-CM

## 2019-03-05 MED ORDER — AMOXICILLIN 875 MG PO TABS: 875.0000 mg | ORAL_TABLET | Freq: Two times a day (BID) | ORAL | 0 refills | Status: DC

## 2019-03-05 NOTE — Patient Instructions (Signed)

## 2019-03-08 ENCOUNTER — Encounter: Payer: Self-pay | Admitting: Physician Assistant

## 2019-03-08 DIAGNOSIS — J0140 Acute pansinusitis, unspecified: Secondary | ICD-10-CM

## 2019-03-10 MED ORDER — PREDNISONE 10 MG (21) PO TBPK: ORAL_TABLET | ORAL | 0 refills | Status: DC

## 2019-03-10 NOTE — Addendum Note (Signed)
Addended by: Mar Daring on: 03/10/2019 11:50 AM   Modules accepted: Orders

## 2019-03-19 ENCOUNTER — Other Ambulatory Visit: Payer: Self-pay

## 2019-03-19 ENCOUNTER — Ambulatory Visit: Payer: Managed Care, Other (non HMO) | Admitting: Family Medicine

## 2019-03-19 ENCOUNTER — Encounter: Payer: Self-pay | Admitting: Family Medicine

## 2019-03-19 VITALS — BP 107/69 | HR 77 | Temp 98.0°F | Wt 132.4 lb

## 2019-03-19 DIAGNOSIS — H9202 Otalgia, left ear: Secondary | ICD-10-CM

## 2019-03-19 DIAGNOSIS — R590 Localized enlarged lymph nodes: Secondary | ICD-10-CM | POA: Diagnosis not present

## 2019-03-19 NOTE — Progress Notes (Signed)
Patient: Theresa Walton Female    DOB: 09/17/1966   54 y.o.   MRN: [id number] Visit Date: 03/19/2019  Today's Provider: Vernie Murders, PA   Earache  Subjective:     Otalgia   There is pain in the left ear. The current episode started 1 to 4 weeks ago. The problem has been unchanged. There has been no fever. Pertinent negatives include no ear discharge. She has tried antibiotics for the symptoms. The treatment provided mild relief.   Past Medical History:  Diagnosis Date  . Basal cell carcinoma (BCC) of jawline 06/26/2018  . 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 Genital herpes   . Hypothyroidism   . Seasonal allergies   . Syncope and collapse   . Thyroid disease    hypothyroidism   Past Surgical History:  Procedure Laterality Date  . ABDOMINAL HYSTERECTOMY    . ANTERIOR CRUCIATE LIGAMENT REPAIR Right 2012  . ARTHROSCOPIC REPAIR ACL  2013   Right knee  . AUGMENTATION MAMMAPLASTY Bilateral 2000   saline  . BLADDER REPAIR    . BREAST ENHANCEMENT SURGERY  1995  . COLONOSCOPY WITH PROPOFOL N/A 08/29/2017   Procedure: COLONOSCOPY WITH PROPOFOL;  Surgeon: Jonathon Bellows, MD;  Location: [facility] ENDOSCOPY;  Service: Gastroenterology;  Laterality: N/A;  . CYSTOCELE REPAIR  2013  . ENDOMETRIAL ABLATION  2013  . [place], 2001  . PARTIAL HYSTERECTOMY  2015  . PARTIAL HYSTERECTOMY Bilateral 2014  . RECTOPEXY  2011  . TONSILECTOMY, ADENOIDECTOMY, BILATERAL MYRINGOTOMY AND TUBES  1984  . TOOTH EXTRACTION     Family History  Problem Relation Age of Onset  . Pulmonary fibrosis Mother   . Healthy Sister   . Healthy Brother   . Healthy Sister   . Lung cancer Paternal Grandmother   . Arrhythmia Father   . Heart disease Father   . Prostate cancer Neg Hx   . Bladder Cancer Neg Hx   . Kidney cancer Neg Hx   . Breast cancer Neg Hx    Allergies  Allergen Reactions  . Cefaclor Anaphylaxis  . Cephalosporins Anaphylaxis  . Lubiprostone     Other  reaction(s): Angioedema Tongue Swelling x 2 times.  Other reaction(s): Angioedema Tongue Swelling x 2 times.   . Celecoxib   . Lac Bovis     Other reaction(s): Other (See Comments) GI Upset to milk protein  . Apple Nausea And Vomiting  . Pseudoephedrine Palpitations    Tachycardia    Current Outpatient Medications:  .  Ascorbic Acid (VITAMIN C ER PO), Vitamin C, Disp: , Rfl:  .  B Complex Vitamins (VITAMIN B COMPLEX) TABS, Take by mouth., Disp: , Rfl:  .  Cholecalciferol (VITAMIN D) 50 MCG (2000 UT) CAPS, Take by mouth., Disp: , Rfl:  .  cyclobenzaprine (FLEXERIL) 5 MG tablet, TAKE 1 TO 2 TABLETS BY MOUTH AT BEDTIME, Disp: 30 tablet, Rfl: 5 .  EPINEPHrine (EPIPEN 2-PAK IJ), Inject as directed., Disp: , Rfl:  .  Estradiol (IMVEXXY MAINTENANCE PACK) 10 MCG INST, Place 1 tablet vaginally 2 (two) times a week., Disp: 24 each, Rfl: 3 .  Eszopiclone 3 MG TABS, TAKE 1 TABLET BY MOUTH IMMEDIATELY BEFORE BEDTIME, Disp: 30 tablet, Rfl: 2 .  glucosamine-chondroitin 500-400 MG tablet, Take 1 tablet by mouth 3 (three) times daily., Disp: , Rfl:  .  levothyroxine (SYNTHROID, LEVOTHROID) 25 MCG tablet, TAKE ONE TABLET BY MOUTH BEFORE BREAKFAST, Disp: 90 tablet, Rfl: 1 .  montelukast (SINGULAIR) 10 MG tablet, Take 10 mg by mouth at bedtime., Disp: , Rfl:  .  predniSONE (STERAPRED UNI-PAK 21 TAB) 10 MG (21) TBPK tablet, 6 day taper; take as directed on package instructions, Disp: 21 tablet, Rfl: 0 .  valACYclovir (VALTREX) 500 MG tablet, Take 1 tablet (500 mg total) by mouth 2 (two) times daily., Disp: 180 tablet, Rfl: 1 .  amoxicillin (AMOXIL) 875 MG tablet, Take 1 tablet (875 mg total) by mouth 2 (two) times daily. (Patient not taking: Reported on 03/19/2019), Disp: 20 tablet, Rfl: 0  Review of Systems  Constitutional: Negative.   HENT: Positive for ear pain. Negative for ear discharge.   Respiratory: Negative.   Genitourinary: Negative.   Neurological: Negative.    Social History   Tobacco Use   . Smoking status: Former Smoker    Types: Cigarettes    Last attempt to quit: 05/15/2015    Years since quitting: 3.8  . Smokeless tobacco: Never Used  Substance Use Topics  . Alcohol use: No    Alcohol/week: 0.0 standard drinks     Objective:   BP 107/69 (BP Location: Right Arm, Patient Position: Sitting, Cuff Size: Normal)   Pulse 77   Temp 98 F (36.7 C) (Oral)   Wt 132 lb 6.4 oz (60.1 kg)   SpO2 98%   BMI 24.22 kg/m  Vitals:   03/19/19 1530  BP: 107/69  Pulse: 77  Temp: 98 F (36.7 C)  TempSrc: Oral  SpO2: 98%  Weight: 132 lb 6.4 oz (60.1 kg)   Physical Exam Constitutional:      General: She is not in acute distress.    Appearance: She is well-developed.  HENT:     Head: Normocephalic and atraumatic.     Right Ear: Hearing, tympanic membrane and ear canal normal.     Left Ear: Hearing, tympanic membrane and ear canal normal.     Nose: Nose normal.     Mouth/Throat:     Comments: Slight redness right posterior pharynx without exudates. Eyes:     General: Lids are normal. No scleral icterus.       Right eye: No discharge.        Left eye: No discharge.     Conjunctiva/sclera: Conjunctivae normal.  Neck:     Musculoskeletal: Neck supple. No neck rigidity.     Comments: Prominent left cervical nodes with minimal tenderness. Pink scar with spider capillaries on left chin from past basal cell carcinoma excision. Cardiovascular:     Rate and Rhythm: Normal rate.  Pulmonary:     Effort: Pulmonary effort is normal. No respiratory distress.  Musculoskeletal: Normal range of motion.  Lymphadenopathy:     Cervical: Cervical adenopathy present.  Skin:    Findings: No lesion or rash.  Neurological:     Mental Status: She is alert and oriented to person, place, and time.  Psychiatric:        Speech: Speech normal.        Behavior: Behavior normal.        Thought Content: Thought content normal.       Assessment & Plan    1. Otalgia, left No significant  discomfort in the left ear or drainage. No fever or significant congestion. Sinuses transilluminate well. Took Amoxil for 10 days with a Prednisone taper (finished both a week ago).   2. Left cervical lymphadenopathy Noticed enlargement of left anterior chain cervical node enlargement the past few days. Minimal tenderness without redness or  rash. Will get throat culture and CBC . Did not want more antibiotics today, prefers results of labs first. Recheck prn. May need referral to ENT to evaluate lymphadenopathy pending reports. - CBC with Differential/Platelet - Culture, Group A Strep     Vernie Murders, PA  [facility]

## 2019-03-20 LAB — CBC WITH DIFFERENTIAL/PLATELET
Basophils Absolute: 0 10*3/uL (ref 0.0–0.2)
Basos: 0 %
EOS (ABSOLUTE): 0.1 10*3/uL (ref 0.0–0.4)
Eos: 1 %
Hematocrit: 40.6 % (ref 34.0–46.6)
Hemoglobin: 13.5 g/dL (ref 11.1–15.9)
Immature Grans (Abs): 0 10*3/uL (ref 0.0–0.1)
Immature Granulocytes: 0 %
Lymphocytes Absolute: 2.8 10*3/uL (ref 0.7–3.1)
Lymphs: 39 %
MCH: 29.9 pg (ref 26.6–33.0)
MCHC: 33.3 g/dL (ref 31.5–35.7)
MCV: 90 fL (ref 79–97)
Monocytes Absolute: 0.8 10*3/uL (ref 0.1–0.9)
Monocytes: 11 %
Neutrophils Absolute: 3.6 10*3/uL (ref 1.4–7.0)
Neutrophils: 49 %
Platelets: 364 10*3/uL (ref 150–450)
RBC: 4.51 x10E6/uL (ref 3.77–5.28)
RDW: 12.7 % (ref 11.7–15.4)
WBC: 7.3 10*3/uL (ref 3.4–10.8)

## 2019-03-25 LAB — CULTURE, GROUP A STREP: Strep A Culture: NEGATIVE

## 2019-03-26 ENCOUNTER — Other Ambulatory Visit: Payer: Self-pay | Admitting: Physician Assistant

## 2019-03-26 ENCOUNTER — Telehealth: Payer: Self-pay

## 2019-03-26 ENCOUNTER — Other Ambulatory Visit: Payer: Self-pay | Admitting: Family Medicine

## 2019-03-26 DIAGNOSIS — R590 Localized enlarged lymph nodes: Secondary | ICD-10-CM

## 2019-03-26 DIAGNOSIS — M62838 Other muscle spasm: Secondary | ICD-10-CM

## 2019-03-26 DIAGNOSIS — H9202 Otalgia, left ear: Secondary | ICD-10-CM

## 2019-03-26 NOTE — Telephone Encounter (Signed)
Issue was dealt with over the phone and patient referred to ENT

## 2019-03-26 NOTE — Telephone Encounter (Signed)
Patient has been advised she states that she sent you a message on my chart and would like proceed with ENT. KW

## 2019-03-26 NOTE — Telephone Encounter (Signed)
-----   Message from Margo Common, [place] sent at 03/25/2019  5:42 PM EDT ----- Throat culture is negative. If symptoms persist, can schedule ENT referral to assess lymph nodes.

## 2019-03-26 NOTE — Telephone Encounter (Signed)
Done

## 2019-03-29 ENCOUNTER — Other Ambulatory Visit: Payer: Self-pay | Admitting: Family Medicine

## 2019-03-29 ENCOUNTER — Other Ambulatory Visit: Payer: Self-pay

## 2019-03-29 ENCOUNTER — Ambulatory Visit
Admission: RE | Admit: 2019-03-29 | Discharge: 2019-03-29 | Disposition: A | Discharge status: Home / Self Care | Payer: Managed Care, Other (non HMO) | Source: Ambulatory Visit | Attending: Family Medicine | Admitting: Family Medicine

## 2019-03-29 DIAGNOSIS — R928 Other abnormal and inconclusive findings on diagnostic imaging of breast: Secondary | ICD-10-CM

## 2019-03-29 DIAGNOSIS — Z1231 Encounter for screening mammogram for malignant neoplasm of breast: Secondary | ICD-10-CM | POA: Diagnosis not present

## 2019-03-29 DIAGNOSIS — N6489 Other specified disorders of breast: Secondary | ICD-10-CM

## 2019-03-30 ENCOUNTER — Other Ambulatory Visit: Payer: Self-pay | Admitting: Otolaryngology

## 2019-03-30 DIAGNOSIS — L040 Acute lymphadenitis of face, head and neck: Secondary | ICD-10-CM

## 2019-04-01 ENCOUNTER — Ambulatory Visit: Payer: Managed Care, Other (non HMO)

## 2019-04-01 ENCOUNTER — Ambulatory Visit: Admission: RE | Admit: 2019-04-01 | Payer: Managed Care, Other (non HMO) | Source: Ambulatory Visit

## 2019-04-02 ENCOUNTER — Ambulatory Visit
Admission: RE | Admit: 2019-04-02 | Discharge: 2019-04-02 | Disposition: A | Discharge status: Home / Self Care | Payer: Managed Care, Other (non HMO) | Source: Ambulatory Visit | Attending: Family Medicine | Admitting: Family Medicine

## 2019-04-02 ENCOUNTER — Other Ambulatory Visit: Payer: Self-pay

## 2019-04-02 DIAGNOSIS — R928 Other abnormal and inconclusive findings on diagnostic imaging of breast: Secondary | ICD-10-CM

## 2019-04-02 DIAGNOSIS — N6489 Other specified disorders of breast: Secondary | ICD-10-CM | POA: Diagnosis present

## 2019-04-08 ENCOUNTER — Ambulatory Visit: Admission: RE | Admit: 2019-04-08 | Payer: Self-pay | Source: Ambulatory Visit

## 2019-04-12 ENCOUNTER — Telehealth: Payer: Self-pay | Admitting: *Deleted

## 2019-04-12 ENCOUNTER — Ambulatory Visit: Payer: Managed Care, Other (non HMO) | Admitting: Internal Medicine

## 2019-04-12 ENCOUNTER — Telehealth: Payer: Self-pay

## 2019-04-12 ENCOUNTER — Encounter: Payer: Self-pay | Admitting: Internal Medicine

## 2019-04-12 ENCOUNTER — Other Ambulatory Visit: Payer: Self-pay

## 2019-04-12 VITALS — BP 106/71 | HR 80 | Resp 16 | Ht 62.0 in | Wt 134.0 lb

## 2019-04-12 DIAGNOSIS — Z836 Family history of other diseases of the respiratory system: Secondary | ICD-10-CM

## 2019-04-12 DIAGNOSIS — J309 Allergic rhinitis, unspecified: Secondary | ICD-10-CM

## 2019-04-12 DIAGNOSIS — G4700 Insomnia, unspecified: Secondary | ICD-10-CM

## 2019-04-12 DIAGNOSIS — Z20822 Contact with and (suspected) exposure to covid-19: Secondary | ICD-10-CM

## 2019-04-12 MED ORDER — ESZOPICLONE 3 MG PO TABS: 3.0000 mg | ORAL_TABLET | Freq: Every day | ORAL | 2 refills | Status: DC

## 2019-04-12 NOTE — Telephone Encounter (Signed)
Pt called and scheduled for testing at [facility] site. Pt advised to wear a mask and remain in car at appt time. Pt verbalized understanding.

## 2019-04-12 NOTE — Telephone Encounter (Signed)
-----   Message from Corlis Hove sent at 04/12/2019  2:39 PM EDT ----- Can please schedule covid 19 test pt phone no [phone] and [place] ,Kyonna [id number]

## 2019-04-12 NOTE — Patient Instructions (Signed)

## 2019-04-12 NOTE — Telephone Encounter (Signed)
Spoke with pt we send message for covid 19 test they will call her and schedule her appt

## 2019-04-12 NOTE — Progress Notes (Signed)
[facility] [place], [place] [address]  Pulmonary Sleep Medicine   Office Visit Note  Patient Name: Theresa Walton DOB: Dec 08, 1965 MRN [id number]  Date of Service: 04/12/2019  Complaints/HPI: PT is here for follow up. She reports with the stress of COVID she is currently requiring melatonin to stay asleep and lunesta to get to sleep. She reports her allergies have been persistent,and she continues to take medications as directed.    ROS  General: (-) fever, (-) chills, (-) night sweats, (-) weakness Skin: (-) rashes, (-) itching,. Eyes: (-) visual changes, (-) redness, (-) itching. Nose and Sinuses: (-) nasal stuffiness or itchiness, (-) postnasal drip, (-) nosebleeds, (-) sinus trouble. Mouth and Throat: (-) sore throat, (-) hoarseness. Neck: (-) swollen glands, (-) enlarged thyroid, (-) neck pain. Respiratory: - cough, (-) bloody sputum, - shortness of breath, - wheezing. Cardiovascular: - ankle swelling, (-) chest pain. Lymphatic: (-) lymph node enlargement. Neurologic: (-) numbness, (-) tingling. Psychiatric: (-) anxiety, (-) depression   Current Medication: Outpatient Encounter Medications as of 04/12/2019  Medication Sig  . cyclobenzaprine (FLEXERIL) 5 MG tablet TAKE 1 TO 2 TABLETS BY MOUTH AT BEDTIME  . EPINEPHrine (EPIPEN 2-PAK IJ) Inject as directed.  . Estradiol (IMVEXXY MAINTENANCE PACK) 10 MCG INST Place 1 tablet vaginally 2 (two) times a week.  . Eszopiclone 3 MG TABS TAKE 1 TABLET BY MOUTH IMMEDIATELY BEFORE BEDTIME  . levothyroxine (SYNTHROID, LEVOTHROID) 25 MCG tablet TAKE ONE TABLET BY MOUTH BEFORE BREAKFAST  . valACYclovir (VALTREX) 500 MG tablet Take 1 tablet (500 mg total) by mouth 2 (two) times daily.  . [DISCONTINUED] amoxicillin (AMOXIL) 875 MG tablet Take 1 tablet (875 mg total) by mouth 2 (two) times daily. (Patient not taking: Reported on 03/19/2019)  . [DISCONTINUED] Ascorbic Acid (VITAMIN C ER PO) Vitamin C  .  [DISCONTINUED] B Complex Vitamins (VITAMIN B COMPLEX) TABS Take by mouth.  . [DISCONTINUED] Cholecalciferol (VITAMIN D) 50 MCG (2000 UT) CAPS Take by mouth.  . [DISCONTINUED] glucosamine-chondroitin 500-400 MG tablet Take 1 tablet by mouth 3 (three) times daily.  . [DISCONTINUED] montelukast (SINGULAIR) 10 MG tablet Take 10 mg by mouth at bedtime.  . [DISCONTINUED] predniSONE (STERAPRED UNI-PAK 21 TAB) 10 MG (21) TBPK tablet 6 day taper; take as directed on package instructions (Patient not taking: Reported on 04/12/2019)   No facility-administered encounter medications on file as of 04/12/2019.     Surgical History: Past Surgical History:  Procedure Laterality Date  . ABDOMINAL HYSTERECTOMY    . ANTERIOR CRUCIATE LIGAMENT REPAIR Right 2012  . ARTHROSCOPIC REPAIR ACL  2013   Right knee  . AUGMENTATION MAMMAPLASTY Bilateral 2000   saline  . BLADDER REPAIR    . BREAST ENHANCEMENT SURGERY  1995  . COLONOSCOPY WITH PROPOFOL N/A 08/29/2017   Procedure: COLONOSCOPY WITH PROPOFOL;  Surgeon: Jonathon Bellows, MD;  Location: [facility] ENDOSCOPY;  Service: Gastroenterology;  Laterality: N/A;  . CYSTOCELE REPAIR  2013  . ENDOMETRIAL ABLATION  2013  . [place], 2001  . PARTIAL HYSTERECTOMY  2015  . PARTIAL HYSTERECTOMY Bilateral 2014  . RECTOPEXY  2011  . TONSILECTOMY, ADENOIDECTOMY, BILATERAL MYRINGOTOMY AND TUBES  1984  . TOOTH EXTRACTION      Medical History: Past Medical History:  Diagnosis Date  . Basal cell carcinoma (BCC) of jawline 06/26/2018  . 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[place])   . Genital herpes   . Hypothyroidism   . Seasonal allergies   . Syncope and collapse   .  Thyroid disease    hypothyroidism    Family History: Family History  Problem Relation Age of Onset  . Pulmonary fibrosis Mother   . Healthy Sister   . Healthy Brother   . Healthy Sister   . Lung cancer Paternal Grandmother   . Arrhythmia Father   . Heart disease Father   . Prostate cancer Neg  Hx   . Bladder Cancer Neg Hx   . Kidney cancer Neg Hx   . Breast cancer Neg Hx     Social History: Social History   Socioeconomic History  . Marital status: Divorced    Spouse name: Not on file  . Number of children: Not on file  . Years of education: Not on file  . Highest education level: Not on file  Occupational History  . Not on file  Social Needs  . Financial resource strain: Not on file  . Food insecurity    Worry: Not on file    Inability: Not on file  . Transportation needs    Medical: Not on file    Non-medical: Not on file  Tobacco Use  . Smoking status: Former Smoker    Types: Cigarettes    Quit date: 05/15/2015    Years since quitting: 3.9  . Smokeless tobacco: Never Used  Substance and Sexual Activity  . Alcohol use: No    Alcohol/week: 0.0 standard drinks  . Drug use: No  . Sexual activity: Never    Partners: Male    Birth control/protection: Surgical  Lifestyle  . Physical activity    Days per week: Not on file    Minutes per session: Not on file  . Stress: Not on file  Relationships  . Social Herbalist on phone: Not on file    Gets together: Not on file    Attends religious service: Not on file    Active member of club or organization: Not on file    Attends meetings of clubs or organizations: Not on file    Relationship status: Not on file  . Intimate partner violence    Fear of current or ex partner: Not on file    Emotionally abused: Not on file    Physically abused: Not on file    Forced sexual activity: Not on file  Other Topics Concern  . Not on file  Social History Narrative  . Not on file    Vital Signs: Blood pressure 106/71, pulse 80, resp. rate 16, height 5\' 2"  (1.575 m), weight 134 lb (60.8 kg), SpO2 100 %.  Examination: General Appearance: The patient is well-developed, well-nourished, and in no distress. Skin: Gross inspection of skin unremarkable. Head: normocephalic, no gross deformities. Eyes: no gross  deformities noted. ENT: ears appear grossly normal no exudates. Neck: Supple. No thyromegaly. No LAD. Respiratory: clear bilateraly. Cardiovascular: Normal S1 and S2 without murmur or rub. Extremities: No cyanosis. pulses are equal. Neurologic: Alert and oriented. No involuntary movements.  LABS: Recent Results (from the past 2160 hour(s))  CBC with Differential/Platelet     Status: None   Collection Time: 03/19/19  4:33 PM  Result Value Ref Range   WBC 7.3 3.4 - 10.8 x10E3/uL   RBC 4.51 3.77 - 5.28 x10E6/uL   Hemoglobin 13.5 11.1 - 15.9 g/dL   Hematocrit 40.6 34.0 - 46.6 %   MCV 90 79 - 97 fL   MCH 29.9 26.6 - 33.0 pg   MCHC 33.3 31.5 - 35.7 g/dL   RDW  12.7 11.7 - 15.4 %   Platelets 364 150 - 450 x10E3/uL   Neutrophils 49 Not Estab. %   Lymphs 39 Not Estab. %   Monocytes 11 Not Estab. %   Eos 1 Not Estab. %   Basos 0 Not Estab. %   Neutrophils Absolute 3.6 1.4 - 7.0 x10E3/uL   Lymphocytes Absolute 2.8 0.7 - 3.1 x10E3/uL   Monocytes Absolute 0.8 0.1 - 0.9 x10E3/uL   EOS (ABSOLUTE) 0.1 0.0 - 0.4 x10E3/uL   Basophils Absolute 0.0 0.0 - 0.2 x10E3/uL   Immature Granulocytes 0 Not Estab. %   Immature Grans (Abs) 0.0 0.0 - 0.1 x10E3/uL  Culture, Group A Strep     Status: None   Collection Time: 03/23/19 12:12 PM   Specimen: Throat   PX  Result Value Ref Range   Strep A Culture Negative     Radiology: US Breast Ltd Uni Right Inc Axilla  Result Date: 04/02/2019 CLINICAL DATA:  Patient recalled from screening for right breast mass. Additionally, patient reports newly palpable concern within the right breast laterally along the implant. EXAM: DIGITAL DIAGNOSTIC RIGHT MAMMOGRAM WITH IMPLANTS, CAD AND TOMO ULTRASOUND RIGHT BREAST The patient has retropectoral implants. Standard and implant displaced views were performed. COMPARISON:  Previous exam(s). ACR Breast Density Category c: The breast tissue is heterogeneously dense, which may obscure small masses. FINDINGS: Within the  upper-outer right breast anterior depth there is a persistent low-density oval circumscribed mass, further evaluated with additional views. No suspicious abnormality underlying the palpable marker within the 8 o'clock position. On physical exam, no discrete mass is palpated within the outer right breast. Targeted ultrasound is performed, showing a 1.1 x 0.6 x 1.1 cm simple cyst right breast 11 o'clock position 1 cm from the nipple. Within the right breast 8 o'clock position 2 cm from nipple there is no focal abnormality. Mammographic images were processed with CAD. IMPRESSION: Right breast simple cyst. No focal abnormality at the site of palpable concern outer right breast. RECOMMENDATION: Screening mammogram in one year.(Code:SM-B-01Y). Continued clinical evaluation for right breast palpable abnormality. I have discussed the findings and recommendations with the patient. Results were also provided in writing at the conclusion of the visit. If applicable, a reminder letter will be sent to the patient regarding the next appointment. BI-RADS CATEGORY  2: Benign. Electronically Signed   By: Lovey Newcomer M.D.   On: 04/02/2019 16:10   Mm Diag Breast W/implant Tomo Uni R  Result Date: 04/02/2019 CLINICAL DATA:  Patient recalled from screening for right breast mass. Additionally, patient reports newly palpable concern within the right breast laterally along the implant. EXAM: DIGITAL DIAGNOSTIC RIGHT MAMMOGRAM WITH IMPLANTS, CAD AND TOMO ULTRASOUND RIGHT BREAST The patient has retropectoral implants. Standard and implant displaced views were performed. COMPARISON:  Previous exam(s). ACR Breast Density Category c: The breast tissue is heterogeneously dense, which may obscure small masses. FINDINGS: Within the upper-outer right breast anterior depth there is a persistent low-density oval circumscribed mass, further evaluated with additional views. No suspicious abnormality underlying the palpable marker within the 8  o'clock position. On physical exam, no discrete mass is palpated within the outer right breast. Targeted ultrasound is performed, showing a 1.1 x 0.6 x 1.1 cm simple cyst right breast 11 o'clock position 1 cm from the nipple. Within the right breast 8 o'clock position 2 cm from nipple there is no focal abnormality. Mammographic images were processed with CAD. IMPRESSION: Right breast simple cyst. No focal abnormality at the site  of palpable concern outer right breast. RECOMMENDATION: Screening mammogram in one year.(Code:SM-B-01Y). Continued clinical evaluation for right breast palpable abnormality. I have discussed the findings and recommendations with the patient. Results were also provided in writing at the conclusion of the visit. If applicable, a reminder letter will be sent to the patient regarding the next appointment. BI-RADS CATEGORY  2: Benign. Electronically Signed   By: Lovey Newcomer M.D.   On: 04/02/2019 16:10    No results found.  US Breast Ltd Uni Right Inc Axilla  Result Date: 04/02/2019 CLINICAL DATA:  Patient recalled from screening for right breast mass. Additionally, patient reports newly palpable concern within the right breast laterally along the implant. EXAM: DIGITAL DIAGNOSTIC RIGHT MAMMOGRAM WITH IMPLANTS, CAD AND TOMO ULTRASOUND RIGHT BREAST The patient has retropectoral implants. Standard and implant displaced views were performed. COMPARISON:  Previous exam(s). ACR Breast Density Category c: The breast tissue is heterogeneously dense, which may obscure small masses. FINDINGS: Within the upper-outer right breast anterior depth there is a persistent low-density oval circumscribed mass, further evaluated with additional views. No suspicious abnormality underlying the palpable marker within the 8 o'clock position. On physical exam, no discrete mass is palpated within the outer right breast. Targeted ultrasound is performed, showing a 1.1 x 0.6 x 1.1 cm simple cyst right breast 11 o'clock  position 1 cm from the nipple. Within the right breast 8 o'clock position 2 cm from nipple there is no focal abnormality. Mammographic images were processed with CAD. IMPRESSION: Right breast simple cyst. No focal abnormality at the site of palpable concern outer right breast. RECOMMENDATION: Screening mammogram in one year.(Code:SM-B-01Y). Continued clinical evaluation for right breast palpable abnormality. I have discussed the findings and recommendations with the patient. Results were also provided in writing at the conclusion of the visit. If applicable, a reminder letter will be sent to the patient regarding the next appointment. BI-RADS CATEGORY  2: Benign. Electronically Signed   By: Lovey Newcomer M.D.   On: 04/02/2019 16:10   Mm 3d Screen Breast W/implant Bilateral  Result Date: 03/29/2019 CLINICAL DATA:  Screening. EXAM: DIGITAL SCREENING BILATERAL MAMMOGRAM WITH IMPLANTS, CAD AND TOMO The patient has retropectoral implants. Standard and implant displaced views were performed. COMPARISON:  Previous exam(s). ACR Breast Density Category c: The breast tissue is heterogeneously dense, which may obscure small masses. FINDINGS: In the right breast, a possible asymmetry warrants further evaluation. In the left breast, no suspicious masses or malignant type calcifications are identified. Images were processed with CAD. IMPRESSION: Further evaluation is suggested for possible asymmetry in the right breast. RECOMMENDATION: 3D diagnostic mammogram and possibly ultrasound of the right breast. (Code:[id number]-71M) The patient will be contacted regarding the findings, and additional imaging will be scheduled. BI-RADS CATEGORY  0: Incomplete. Need additional imaging evaluation and/or prior mammograms for comparison. Electronically Signed   By: Lillia Mountain M.D.   On: 03/29/2019 13:08   Mm Diag Breast W/implant Tomo Uni R  Result Date: 04/02/2019 CLINICAL DATA:  Patient recalled from screening for right breast mass.  Additionally, patient reports newly palpable concern within the right breast laterally along the implant. EXAM: DIGITAL DIAGNOSTIC RIGHT MAMMOGRAM WITH IMPLANTS, CAD AND TOMO ULTRASOUND RIGHT BREAST The patient has retropectoral implants. Standard and implant displaced views were performed. COMPARISON:  Previous exam(s). ACR Breast Density Category c: The breast tissue is heterogeneously dense, which may obscure small masses. FINDINGS: Within the upper-outer right breast anterior depth there is a persistent low-density oval circumscribed mass, further evaluated with  additional views. No suspicious abnormality underlying the palpable marker within the 8 o'clock position. On physical exam, no discrete mass is palpated within the outer right breast. Targeted ultrasound is performed, showing a 1.1 x 0.6 x 1.1 cm simple cyst right breast 11 o'clock position 1 cm from the nipple. Within the right breast 8 o'clock position 2 cm from nipple there is no focal abnormality. Mammographic images were processed with CAD. IMPRESSION: Right breast simple cyst. No focal abnormality at the site of palpable concern outer right breast. RECOMMENDATION: Screening mammogram in one year.(Code:SM-B-01Y). Continued clinical evaluation for right breast palpable abnormality. I have discussed the findings and recommendations with the patient. Results were also provided in writing at the conclusion of the visit. If applicable, a reminder letter will be sent to the patient regarding the next appointment. BI-RADS CATEGORY  2: Benign. Electronically Signed   By: Lovey Newcomer M.D.   On: 04/02/2019 16:10      Assessment and Plan: Patient Active Problem List   Diagnosis Date Noted  . Edema 08/05/2018  . Impingement syndrome of shoulder region 08/05/2018  . Knee joint effusion 08/05/2018  . Old anterior cruciate ligament disruption 08/05/2018  . Patellar tendonitis 08/05/2018  . Basal cell carcinoma (BCC) of jawline 06/26/2018  . History  of pubovaginal sling 04/01/2017  . Essential tremor 05/15/2016  . Allergic rhinitis 06/22/2015  . B12 deficiency 06/21/2015  . H/O: hysterectomy 06/21/2015  . Hypertriglyceridemia 06/21/2015  . Hypoglycemia 06/21/2015  . Menopausal and perimenopausal disorder 06/21/2015  . Vasovagal symptom 06/21/2015  . Insomnia 04/19/2015  . Female stress incontinence 03/24/2012  . Adult hypothyroidism 08/28/2009  . Avitaminosis D 08/28/2009  . Chronic infection of sinus 02/01/2009  . Adaptation reaction 09/09/2008  . Genital herpes simplex type 1 infection 07/13/2008    1. Insomnia, unspecified type Renewed lunesta at this time. Pt denies any new issues.   2. Allergic rhinitis, unspecified seasonality, unspecified trigger Stable, continue to use medications as described.   3. Family history of pulmonary fibrosis No infiltration or hyperinflation at last CXR, continue to follow.    General Counseling: I have discussed the findings of the evaluation and examination with [place].  I have also discussed any further diagnostic evaluation thatmay be needed or ordered today. Kirstin verbalizes understanding of the findings of todays visit. We also reviewed her medications today and discussed drug interactions and side effects including but not limited excessive drowsiness and altered mental states. We also discussed that there is always a risk not just to her but also people around her. she has been encouraged to call the office with any questions or concerns that should arise related to todays visit.    Time spent: 20 This patient was seen by Orson Gear AGNP-C in Collaboration with Dr. Devona Konig as a part of collaborative care agreement.   I have personally obtained a history, examined the patient, evaluated laboratory and imaging results, formulated the assessment and plan and placed orders.    Allyne Gee, MD [facility] Pulmonary and Critical Care Sleep medicine

## 2019-04-13 ENCOUNTER — Encounter: Payer: Self-pay | Admitting: Family Medicine

## 2019-04-13 ENCOUNTER — Other Ambulatory Visit: Payer: Self-pay

## 2019-04-13 ENCOUNTER — Telehealth (INDEPENDENT_AMBULATORY_CARE_PROVIDER_SITE_OTHER): Payer: Managed Care, Other (non HMO) | Admitting: Family Medicine

## 2019-04-13 ENCOUNTER — Ambulatory Visit
Admission: RE | Admit: 2019-04-13 | Discharge: 2019-04-13 | Disposition: A | Discharge status: Home / Self Care | Payer: Managed Care, Other (non HMO) | Source: Ambulatory Visit | Attending: Otolaryngology | Admitting: Otolaryngology

## 2019-04-13 ENCOUNTER — Ambulatory Visit: Admission: RE | Admit: 2019-04-13 | Payer: Managed Care, Other (non HMO) | Source: Ambulatory Visit

## 2019-04-13 ENCOUNTER — Other Ambulatory Visit: Payer: Managed Care, Other (non HMO)

## 2019-04-13 DIAGNOSIS — L040 Acute lymphadenitis of face, head and neck: Secondary | ICD-10-CM | POA: Insufficient documentation

## 2019-04-13 DIAGNOSIS — N959 Unspecified menopausal and perimenopausal disorder: Secondary | ICD-10-CM

## 2019-04-13 DIAGNOSIS — E039 Hypothyroidism, unspecified: Secondary | ICD-10-CM | POA: Diagnosis not present

## 2019-04-13 DIAGNOSIS — Z90710 Acquired absence of both cervix and uterus: Secondary | ICD-10-CM

## 2019-04-13 DIAGNOSIS — Z20822 Contact with and (suspected) exposure to covid-19: Secondary | ICD-10-CM

## 2019-04-13 MED ORDER — IOHEXOL 300 MG/ML  SOLN
75.0000 mL | Freq: Once | INTRAMUSCULAR | Status: AC | PRN
  Administered 2019-04-13: 75 mL via INTRAVENOUS

## 2019-04-13 NOTE — Progress Notes (Signed)
Pt presents for virtual visit. Pt has been experiencing hot and cold flashes that really disturbs her sleep. She states that her symptoms are consistent throughout the day. She states that her sx seems to be different from the normal hot flashes. She describes the feeling by stating that "her thermostat is broken". Her symptoms has been present for a couple of weeks. Her ENT did check her thyroid panel, but T3 and T4 was not checked.

## 2019-04-13 NOTE — Assessment & Plan Note (Signed)
Wants to r/o thyroid issue. Then might consider HRT due to unmanageable hot flashes. Advised of HRT risks and benefits. Discussed possibly using lifestyle changes and she is already doing this. Use of antidepressants discussed as well. We will re-connect soon and she will let me know how she wants to proceed.

## 2019-04-13 NOTE — Progress Notes (Signed)
TELEHEALTH GYNECOLOGY VIRTUAL VIDEO VISIT ENCOUNTER NOTE  Provider location: [facility]   I connected with Theresa Walton on 04/13/19 at  2:45 PM EDT by MyChart Video Encounter at home and verified that I am speaking with the correct person using two identifiers.   I discussed the limitations, risks, security and privacy concerns of performing an evaluation and management service by telephone and the availability of in person appointments. I also discussed with the patient that there may be a patient responsible charge related to this service. The patient expressed understanding and agreed to proceed.   History:  Theresa Walton is a 53 y.o. G0P0000 female being evaluated today for hot flashes. States "My thermostat is broken". Wakes up and throw all the covers off because she is so hot then has to curl up in the covers and is freezing. ENT has checked her thyroid.  No UTI since beginning vaginal estrogen. Has hot flashes in the day. Every 30-45 minutes.  She denies any abnormal vaginal discharge, bleeding, pelvic pain or other concerns.       Past Medical History:  Diagnosis Date  . Basal cell carcinoma (BCC) of jawline 06/26/2018  . 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[place])   . Genital herpes   . Hypothyroidism   . Seasonal allergies   . Syncope and collapse   . Thyroid disease    hypothyroidism   Past Surgical History:  Procedure Laterality Date  . ABDOMINAL HYSTERECTOMY    . ANTERIOR CRUCIATE LIGAMENT REPAIR Right 2012  . ARTHROSCOPIC REPAIR ACL  2013   Right knee  . AUGMENTATION MAMMAPLASTY Bilateral 2000   saline  . BLADDER REPAIR    . BREAST ENHANCEMENT SURGERY  1995  . COLONOSCOPY WITH PROPOFOL N/A 08/29/2017   Procedure: COLONOSCOPY WITH PROPOFOL;  Surgeon: Jonathon Bellows, MD;  Location: [facility] ENDOSCOPY;  Service: Gastroenterology;  Laterality: N/A;  . CYSTOCELE REPAIR  2013  . ENDOMETRIAL ABLATION  2013  . [place], 2001   . PARTIAL HYSTERECTOMY  2015  . PARTIAL HYSTERECTOMY Bilateral 2014  . RECTOPEXY  2011  . TONSILECTOMY, ADENOIDECTOMY, BILATERAL MYRINGOTOMY AND TUBES  1984  . TOOTH EXTRACTION     The following portions of the patient's history were reviewed and updated as appropriate: allergies, current medications, past family history, past medical history, past social history, past surgical history and problem list.    Review of Systems:  Pertinent items noted in HPI and remainder of comprehensive ROS otherwise negative.  Physical Exam:   General:  Alert, oriented and cooperative. Patient appears to be in no acute distress.  Mental Status: Normal mood and affect. Normal behavior. Normal judgment and thought content.   Respiratory: Normal respiratory effort, no problems with respiration noted  Rest of physical exam deferred due to type of encounter    Assessment and Plan:     Problem List Items Addressed This Visit      Unprioritized   H/O: hysterectomy   Adult hypothyroidism - Primary   Relevant Orders   TSH   T3, free   T4, free   Menopausal and perimenopausal disorder    Wants to r/o thyroid issue. Then might consider HRT due to unmanageable hot flashes. Advised of HRT risks and benefits. Discussed possibly using lifestyle changes and she is already doing this. Use of antidepressants discussed as well. We will re-connect soon and she will let me know how she wants to proceed.  I discussed the assessment and treatment plan with the patient. The patient was provided an opportunity to ask questions and all were answered. The patient agreed with the plan and demonstrated an understanding of the instructions.   The patient was advised to call back or seek an in-person evaluation/go to the ED if the symptoms worsen or if the condition fails to improve as anticipated.  I provided 15 minutes of face-to-face time during this encounter.  Return in about 3 weeks (around 05/04/2019) for  a follow-up.   Donnamae Jude, MD [facility], [place]

## 2019-04-14 LAB — NOVEL CORONAVIRUS, NAA: SARS-CoV-2, NAA: NOT DETECTED

## 2019-04-15 ENCOUNTER — Other Ambulatory Visit: Payer: Self-pay | Admitting: Otolaryngology

## 2019-04-15 ENCOUNTER — Other Ambulatory Visit: Payer: Self-pay

## 2019-04-15 DIAGNOSIS — R221 Localized swelling, mass and lump, neck: Secondary | ICD-10-CM

## 2019-04-16 ENCOUNTER — Ambulatory Visit: Payer: Managed Care, Other (non HMO)

## 2019-04-20 LAB — TSH: TSH: 4.25 u[IU]/mL (ref 0.450–4.500)

## 2019-04-20 LAB — T4, FREE: Free T4: 1.15 ng/dL (ref 0.82–1.77)

## 2019-04-20 LAB — T3, FREE: T3, Free: 2.8 pg/mL (ref 2.0–4.4)

## 2019-04-20 NOTE — Progress Notes (Signed)
Patient: Theresa Walton, Female    DOB: Feb 28, 1966, 53 y.o.   MRN: [id number] Visit Date: 04/21/2019  Today's Provider: Mar Daring, PA-C   Chief Complaint  Patient presents with  . Annual Exam   Subjective:     Annual physical exam Theresa Walton is a 53 y.o. female who presents today for health maintenance and complete physical. She feels fairly well. She reports exercising yes/walking. She reports she is sleeping poorly. -----------------------------------------------------------------   Review of Systems  Constitutional: Positive for chills and diaphoresis.  HENT: Positive for postnasal drip and sinus pressure.   Eyes: Negative.   Respiratory: Negative.   Cardiovascular: Negative.   Gastrointestinal: Negative.   Endocrine: Positive for heat intolerance.  Genitourinary: Negative.   Musculoskeletal: Positive for neck pain and neck stiffness.  Skin: Negative.   Allergic/Immunologic: Positive for environmental allergies and food allergies.  Neurological: Positive for tremors.  Hematological: Positive for adenopathy. Bruises/bleeds easily.  Chronic issues  Social History      She  reports that she quit smoking about 3 years ago. Her smoking use included cigarettes. She has never used smokeless tobacco. She reports that she does not drink alcohol or use drugs.       Social History   Socioeconomic History  . Marital status: Divorced    Spouse name: Not on file  . Number of children: Not on file  . Years of education: Not on file  . Highest education level: Not on file  Occupational History  . Not on file  Social Needs  . Financial resource strain: Not on file  . Food insecurity    Worry: Not on file    Inability: Not on file  . Transportation needs    Medical: Not on file    Non-medical: Not on file  Tobacco Use  . Smoking status: Former Smoker    Types: Cigarettes    Quit date: 05/15/2015    Years since quitting: 3.9  . Smokeless tobacco:  Never Used  Substance and Sexual Activity  . Alcohol use: No    Alcohol/week: 0.0 standard drinks  . Drug use: No  . Sexual activity: Never    Partners: Male    Birth control/protection: Surgical  Lifestyle  . Physical activity    Days per week: Not on file    Minutes per session: Not on file  . Stress: Not on file  Relationships  . Social Herbalist on phone: Not on file    Gets together: Not on file    Attends religious service: Not on file    Active member of club or organization: Not on file    Attends meetings of clubs or organizations: Not on file    Relationship status: Not on file  Other Topics Concern  . Not on file  Social History Narrative  . Not on file    Past Medical History:  Diagnosis Date  . Basal cell carcinoma (BCC) of jawline 06/26/2018  . 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 Genital herpes   . Hypothyroidism   . Seasonal allergies   . Syncope and collapse   . Thyroid disease    hypothyroidism     Patient Active Problem List   Diagnosis Date Noted  . Edema 08/05/2018  . Impingement syndrome of shoulder region 08/05/2018  . Knee joint effusion 08/05/2018  . Old anterior cruciate ligament disruption 08/05/2018  . Patellar tendonitis 08/05/2018  . Basal cell carcinoma (BCC)  of jawline 06/26/2018  . History of pubovaginal sling 04/01/2017  . Essential tremor 05/15/2016  . Allergic rhinitis 06/22/2015  . B12 deficiency 06/21/2015  . H/O: hysterectomy 06/21/2015  . Hypertriglyceridemia 06/21/2015  . Hypoglycemia 06/21/2015  . Menopausal and perimenopausal disorder 06/21/2015  . Vasovagal symptom 06/21/2015  . Insomnia 04/19/2015  . Female stress incontinence 03/24/2012  . Adult hypothyroidism 08/28/2009  . Avitaminosis D 08/28/2009  . Chronic infection of sinus 02/01/2009  . Adaptation reaction 09/09/2008  . Genital herpes simplex type 1 infection 07/13/2008    Past Surgical History:  Procedure Laterality Date  . ABDOMINAL  HYSTERECTOMY    . ANTERIOR CRUCIATE LIGAMENT REPAIR Right 2012  . ARTHROSCOPIC REPAIR ACL  2013   Right knee  . AUGMENTATION MAMMAPLASTY Bilateral 2000   saline  . BLADDER REPAIR    . BREAST ENHANCEMENT SURGERY  1995  . COLONOSCOPY WITH PROPOFOL N/A 08/29/2017   Procedure: COLONOSCOPY WITH PROPOFOL;  Surgeon: Jonathon Bellows, MD;  Location: [facility] ENDOSCOPY;  Service: Gastroenterology;  Laterality: N/A;  . CYSTOCELE REPAIR  2013  . ENDOMETRIAL ABLATION  2013  . [place], 2001  . PARTIAL HYSTERECTOMY  2015  . PARTIAL HYSTERECTOMY Bilateral 2014  . RECTOPEXY  2011  . TONSILECTOMY, ADENOIDECTOMY, BILATERAL MYRINGOTOMY AND TUBES  1984  . TOOTH EXTRACTION      Family History        Family Status  Relation Name Status  . Mother  Deceased at age 55-01-15  . Sister 1 Alive  . Brother 1 Alive  . Sister 2 Alive  . PGM  (Not Specified)  . Father  Alive  . Neg Hx  (Not Specified)        Her family history includes Arrhythmia in her father; Healthy in her brother, sister, and sister; Heart disease in her father; Lung cancer in her paternal grandmother; Pulmonary fibrosis in her mother. There is no history of Prostate cancer, Bladder Cancer, Kidney cancer, or Breast cancer.      Allergies  Allergen Reactions  . Cefaclor Anaphylaxis  . Cephalosporins Anaphylaxis  . Lubiprostone     Other reaction(s): Angioedema Tongue Swelling x 2 times.  Other reaction(s): Angioedema Tongue Swelling x 2 times.   . Celecoxib   . Lac Bovis     Other reaction(s): Other (See Comments) GI Upset to milk protein  . Apple Nausea And Vomiting  . Pseudoephedrine Palpitations    Tachycardia     Current Outpatient Medications:  .  cyclobenzaprine (FLEXERIL) 5 MG tablet, TAKE 1 TO 2 TABLETS BY MOUTH AT BEDTIME, Disp: 30 tablet, Rfl: 5 .  EPINEPHrine (EPIPEN 2-PAK IJ), Inject as directed., Disp: , Rfl:  .  Estradiol (IMVEXXY MAINTENANCE PACK) 10 MCG INST, Place 1 tablet vaginally 2 (two)  times a week., Disp: 24 each, Rfl: 3 .  Eszopiclone 3 MG TABS, Take 1 tablet (3 mg total) by mouth at bedtime. Take immediately before bedtime, Disp: 30 tablet, Rfl: 2 .  levothyroxine (SYNTHROID, LEVOTHROID) 25 MCG tablet, TAKE ONE TABLET BY MOUTH BEFORE BREAKFAST, Disp: 90 tablet, Rfl: 1 .  valACYclovir (VALTREX) 500 MG tablet, Take 1 tablet (500 mg total) by mouth 2 (two) times daily., Disp: 180 tablet, Rfl: 1   Patient Care Team: Mar Daring, PA-C as PCP - General (Family Medicine)    Objective:    Vitals: BP 100/70 (BP Location: Right Arm, Patient Position: Sitting, Cuff Size: Large)   Pulse 79   Temp 98 F (36.7  C) (Oral)   Resp 16   Ht 5\' 2"  (1.575 m)   Wt 129 lb (58.5 kg)   SpO2 99%   BMI 23.59 kg/m    Vitals:   04/21/19 0814  BP: 100/70  Pulse: 79  Resp: 16  Temp: 98 F (36.7 C)  TempSrc: Oral  SpO2: 99%  Weight: 129 lb (58.5 kg)  Height: 5\' 2"  (1.575 m)     Physical Exam Vitals signs reviewed.  Constitutional:      General: She is not in acute distress.    Appearance: Normal appearance. She is well-developed and normal weight. She is not ill-appearing or diaphoretic.     Comments: Waist circumference: 30.25in  HENT:     Head: Normocephalic and atraumatic.     Right Ear: Tympanic membrane, ear canal and external ear normal.     Left Ear: Tympanic membrane, ear canal and external ear normal.     Nose: Nose normal.     Mouth/Throat:     Mouth: Mucous membranes are moist.     Pharynx: No oropharyngeal exudate.  Eyes:     General: No scleral icterus.       Right eye: No discharge.        Left eye: No discharge.     Conjunctiva/sclera: Conjunctivae normal.     Pupils: Pupils are equal, round, and reactive to light.  Neck:     Musculoskeletal: Normal range of motion and neck supple.     Thyroid: No thyromegaly.     Vascular: No carotid bruit or JVD.     Trachea: No tracheal deviation.  Cardiovascular:     Rate and Rhythm: Normal rate and  regular rhythm.     Pulses: Normal pulses.     Heart sounds: Normal heart sounds. No murmur. No friction rub. No gallop.   Pulmonary:     Effort: Pulmonary effort is normal. No respiratory distress.     Breath sounds: Normal breath sounds. No wheezing or rales.  Chest:     Chest wall: No tenderness.  Abdominal:     General: Bowel sounds are normal. There is no distension.     Palpations: Abdomen is soft. There is no mass.     Tenderness: There is no abdominal tenderness. There is no guarding or rebound.  Musculoskeletal: Normal range of motion.        General: No tenderness.  Lymphadenopathy:     Cervical: Cervical adenopathy present.     Right cervical: Superficial cervical adenopathy (very slight) present.     Left cervical: Superficial cervical adenopathy (very slight) present.  Skin:    General: Skin is warm and dry.     Capillary Refill: Capillary refill takes less than 2 seconds.     Findings: No rash.  Neurological:     General: No focal deficit present.     Mental Status: She is alert and oriented to person, place, and time. Mental status is at baseline.     Cranial Nerves: No cranial nerve deficit.     Motor: No weakness.     Gait: Gait normal.  Psychiatric:        Mood and Affect: Mood normal.        Behavior: Behavior normal.        Thought Content: Thought content normal.        Judgment: Judgment normal.      Depression Screen PHQ 2/9 Scores 04/21/2019 12/10/2018 05/12/2018 12/25/2017  PHQ - 2 Score 0 0 0  0  PHQ- 9 Score 2 - - -       Assessment & Plan:     Routine Health Maintenance and Physical Exam  Exercise Activities and Dietary recommendations Goals   None     Immunization History  Administered Date(s) Administered  . Influenza,inj,Quad PF,6+ Mos 08/02/2014, 08/12/2016, 08/19/2018  . Influenza,inj,quad, With Preservative 08/24/2016  . Influenza-Unspecified 08/29/2015, 08/20/2017  . Tdap 08/12/2016    Health Maintenance  Topic Date Due   . INFLUENZA VACCINE  05/29/2019  . MAMMOGRAM  04/01/2020  . TETANUS/TDAP  08/12/2026  . COLONOSCOPY  08/30/2027  . Hepatitis C Screening  Completed  . HIV Screening  Completed     Discussed health benefits of physical activity, and encouraged her to engage in regular exercise appropriate for her age and condition.    1. Annual physical exam Normal physical exam today. Will check labs as below and f/u pending lab results. If labs are stable and WNL she will not need to have these rechecked for one year at her next annual physical exam. She is to call the office in the meantime if she has any acute issue, questions or concerns.  2. Encounter for biometric screening [employer] employee. Biometric screen as below. Waist circumference was 30.25in. Covid-19 antibody testing is added on for their biometric screen this year thus tested below.  - CBC w/Diff/Platelet - Comprehensive Metabolic Panel (CMET) - Lipid Profile - HgB A1c - SARS-CoV-2 Antibody, IgA - SARS-CoV-2 Antibody, IgM  --------------------------------------------------------------------    Mar Daring, PA-C  [facility]

## 2019-04-21 ENCOUNTER — Encounter: Payer: Self-pay | Admitting: Physician Assistant

## 2019-04-21 ENCOUNTER — Other Ambulatory Visit: Payer: Self-pay

## 2019-04-21 ENCOUNTER — Ambulatory Visit (INDEPENDENT_AMBULATORY_CARE_PROVIDER_SITE_OTHER): Payer: Managed Care, Other (non HMO) | Admitting: Physician Assistant

## 2019-04-21 VITALS — BP 100/70 | HR 79 | Temp 98.0°F | Resp 16 | Ht 62.0 in | Wt 129.0 lb

## 2019-04-21 DIAGNOSIS — Z008 Encounter for other general examination: Secondary | ICD-10-CM

## 2019-04-21 DIAGNOSIS — Z0189 Encounter for other specified special examinations: Secondary | ICD-10-CM | POA: Diagnosis not present

## 2019-04-21 DIAGNOSIS — Z0000 Encounter for general adult medical examination without abnormal findings: Secondary | ICD-10-CM

## 2019-04-21 NOTE — Patient Instructions (Signed)
Health Maintenance for Postmenopausal Women Menopause is a normal process in which your reproductive ability comes to an end. This process happens gradually over a span of months to years, usually between the ages of 62 and 89. Menopause is complete when you have missed 12 consecutive menstrual periods. It is important to talk with your health care provider about some of the most common conditions that affect postmenopausal women, such as heart disease, cancer, and bone loss (osteoporosis). Adopting a healthy lifestyle and getting preventive care can help to promote your health and wellness. Those actions can also lower your chances of developing some of these common conditions. What should I know about menopause? During menopause, you may experience a number of symptoms, such as:  Moderate-to-severe hot flashes.  Night sweats.  Decrease in sex drive.  Mood swings.  Headaches.  Tiredness.  Irritability.  Memory problems.  Insomnia. Choosing to treat or not to treat menopausal changes is an individual decision that you make with your health care provider. What should I know about hormone replacement therapy and supplements? Hormone therapy products are effective for treating symptoms that are associated with menopause, such as hot flashes and night sweats. Hormone replacement carries certain risks, especially as you become older. If you are thinking about using estrogen or estrogen with progestin treatments, discuss the benefits and risks with your health care provider. What should I know about heart disease and stroke? Heart disease, heart attack, and stroke become more likely as you age. This may be due, in part, to the hormonal changes that your body experiences during menopause. These can affect how your body processes dietary fats, triglycerides, and cholesterol. Heart attack and stroke are both medical emergencies. There are many things that you can do to help prevent heart disease  and stroke:  Have your blood pressure checked at least every 1-2 years. High blood pressure causes heart disease and increases the risk of stroke.  If you are 79-72 years old, ask your health care provider if you should take aspirin to prevent a heart attack or a stroke.  Do not use any tobacco products, including cigarettes, chewing tobacco, or electronic cigarettes. If you need help quitting, ask your health care provider.  It is important to eat a healthy diet and maintain a healthy weight. ? Be sure to include plenty of vegetables, fruits, low-fat dairy products, and lean protein. ? Avoid eating foods that are high in solid fats, added sugars, or salt (sodium).  Get regular exercise. This is one of the most important things that you can do for your health. ? Try to exercise for at least 150 minutes each week. The type of exercise that you do should increase your heart rate and make you sweat. This is known as moderate-intensity exercise. ? Try to do strengthening exercises at least twice each week. Do these in addition to the moderate-intensity exercise.  Know your numbers.Ask your health care provider to check your cholesterol and your blood glucose. Continue to have your blood tested as directed by your health care provider.  What should I know about cancer screening? There are several types of cancer. Take the following steps to reduce your risk and to catch any cancer development as early as possible. Breast Cancer  Practice breast self-awareness. ? This means understanding how your breasts normally appear and feel. ? It also means doing regular breast self-exams. Let your health care provider know about any changes, no matter how small.  If you are 40 or  older, have a clinician do a breast exam (clinical breast exam or CBE) every year. Depending on your age, family history, and medical history, it may be recommended that you also have a yearly breast X-ray (mammogram).  If you  have a family history of breast cancer, talk with your health care provider about genetic screening.  If you are at high risk for breast cancer, talk with your health care provider about having an MRI and a mammogram every year.  Breast cancer (BRCA) gene test is recommended for women who have family members with BRCA-related cancers. Results of the assessment will determine the need for genetic counseling and BRCA1 and for BRCA2 testing. BRCA-related cancers include these types: ? Breast. This occurs in males or females. ? Ovarian. ? Tubal. This may also be called fallopian tube cancer. ? Cancer of the abdominal or pelvic lining (peritoneal cancer). ? Prostate. ? Pancreatic. Cervical, Uterine, and Ovarian Cancer Your health care provider may recommend that you be screened regularly for cancer of the pelvic organs. These include your ovaries, uterus, and vagina. This screening involves a pelvic exam, which includes checking for microscopic changes to the surface of your cervix (Pap test).  For women ages 21-65, health care providers may recommend a pelvic exam and a Pap test every three years. For women ages 39-65, they may recommend the Pap test and pelvic exam, combined with testing for human papilloma virus (HPV), every five years. Some types of HPV increase your risk of cervical cancer. Testing for HPV may also be done on women of any age who have unclear Pap test results.  Other health care providers may not recommend any screening for nonpregnant women who are considered low risk for pelvic cancer and have no symptoms. Ask your health care provider if a screening pelvic exam is right for you.  If you have had past treatment for cervical cancer or a condition that could lead to cancer, you need Pap tests and screening for cancer for at least 20 years after your treatment. If Pap tests have been discontinued for you, your risk factors (such as having a new sexual partner) need to be reassessed  to determine if you should start having screenings again. Some women have medical problems that increase the chance of getting cervical cancer. In these cases, your health care provider may recommend that you have screening and Pap tests more often.  If you have a family history of uterine cancer or ovarian cancer, talk with your health care provider about genetic screening.  If you have vaginal bleeding after reaching menopause, tell your health care provider.  There are currently no reliable tests available to screen for ovarian cancer. Lung Cancer Lung cancer screening is recommended for adults 57-50 years old who are at high risk for lung cancer because of a history of smoking. A yearly low-dose CT scan of the lungs is recommended if you:  Currently smoke.  Have a history of at least 30 pack-years of smoking and you currently smoke or have quit within the past 15 years. A pack-year is smoking an average of one pack of cigarettes per day for one year. Yearly screening should:  Continue until it has been 15 years since you quit.  Stop if you develop a health problem that would prevent you from having lung cancer treatment. Colorectal Cancer  This type of cancer can be detected and can often be prevented.  Routine colorectal cancer screening usually begins at age 12 and continues through  age 34.  If you have risk factors for colon cancer, your health care provider may recommend that you be screened at an earlier age.  If you have a family history of colorectal cancer, talk with your health care provider about genetic screening.  Your health care provider may also recommend using home test kits to check for hidden blood in your stool.  A small camera at the end of a tube can be used to examine your colon directly (sigmoidoscopy or colonoscopy). This is done to check for the earliest forms of colorectal cancer.  Direct examination of the colon should be repeated every 5-10 years until  age 45. However, if early forms of precancerous polyps or small growths are found or if you have a family history or genetic risk for colorectal cancer, you may need to be screened more often. Skin Cancer  Check your skin from head to toe regularly.  Monitor any moles. Be sure to tell your health care provider: ? About any new moles or changes in moles, especially if there is a change in a mole's shape or color. ? If you have a mole that is larger than the size of a pencil eraser.  If any of your family members has a history of skin cancer, especially at a young age, talk with your health care provider about genetic screening.  Always use sunscreen. Apply sunscreen liberally and repeatedly throughout the day.  Whenever you are outside, protect yourself by wearing long sleeves, pants, a wide-brimmed hat, and sunglasses. What should I know about osteoporosis? Osteoporosis is a condition in which bone destruction happens more quickly than new bone creation. After menopause, you may be at an increased risk for osteoporosis. To help prevent osteoporosis or the bone fractures that can happen because of osteoporosis, the following is recommended:  If you are 20-3 years old, get at least 1,000 mg of calcium and at least 600 mg of vitamin D per day.  If you are older than age 69 but younger than age 29, get at least 1,200 mg of calcium and at least 600 mg of vitamin D per day.  If you are older than age 37, get at least 1,200 mg of calcium and at least 800 mg of vitamin D per day. Smoking and excessive alcohol intake increase the risk of osteoporosis. Eat foods that are rich in calcium and vitamin D, and do weight-bearing exercises several times each week as directed by your health care provider. What should I know about how menopause affects my mental health? Depression may occur at any age, but it is more common as you become older. Common symptoms of depression include:  Low or sad mood.   Changes in sleep patterns.  Changes in appetite or eating patterns.  Feeling an overall lack of motivation or enjoyment of activities that you previously enjoyed.  Frequent crying spells. Talk with your health care provider if you think that you are experiencing depression. What should I know about immunizations? It is important that you get and maintain your immunizations. These include:  Tetanus, diphtheria, and pertussis (Tdap) booster vaccine.  Influenza every year before the flu season begins.  Pneumonia vaccine.  Shingles vaccine. Your health care provider may also recommend other immunizations. This information is not intended to replace advice given to you by your health care provider. Make sure you discuss any questions you have with your health care provider. Document Released: 12/06/2005 Document Revised: 05/03/2016 Document Reviewed: 07/18/2015 Elsevier Interactive Patient Education  2019 [employer].

## 2019-04-22 LAB — COMPREHENSIVE METABOLIC PANEL
ALT: 21 IU/L (ref 0–32)
AST: 21 IU/L (ref 0–40)
Albumin/Globulin Ratio: 2.6 — ABNORMAL HIGH (ref 1.2–2.2)
Albumin: 5.1 g/dL — ABNORMAL HIGH (ref 3.8–4.9)
Alkaline Phosphatase: 57 IU/L (ref 39–117)
BUN/Creatinine Ratio: 12 (ref 9–23)
BUN: 9 mg/dL (ref 6–24)
Bilirubin Total: 0.5 mg/dL (ref 0.0–1.2)
CO2: 23 mmol/L (ref 20–29)
Calcium: 10 mg/dL (ref 8.7–10.2)
Chloride: 103 mmol/L (ref 96–106)
Creatinine, Ser: 0.75 mg/dL (ref 0.57–1.00)
GFR calc Af Amer: 105 mL/min/{1.73_m2} (ref >59)
GFR calc non Af Amer: 91 mL/min/{1.73_m2} (ref >59)
Globulin, Total: 2 g/dL (ref 1.5–4.5)
Glucose: 83 mg/dL (ref 65–99)
Potassium: 4.2 mmol/L (ref 3.5–5.2)
Sodium: 140 mmol/L (ref 134–144)
Total Protein: 7.1 g/dL (ref 6.0–8.5)

## 2019-04-22 LAB — HEMOGLOBIN A1C
Est. average glucose Bld gHb Est-mCnc: 105 mg/dL
Hgb A1c MFr Bld: 5.3 % (ref 4.8–5.6)

## 2019-04-22 LAB — CBC WITH DIFFERENTIAL/PLATELET
Basophils Absolute: 0 10*3/uL (ref 0.0–0.2)
Basos: 1 %
EOS (ABSOLUTE): 0.1 10*3/uL (ref 0.0–0.4)
Eos: 2 %
Hematocrit: 42.3 % (ref 34.0–46.6)
Hemoglobin: 13.4 g/dL (ref 11.1–15.9)
Immature Grans (Abs): 0 10*3/uL (ref 0.0–0.1)
Immature Granulocytes: 0 %
Lymphocytes Absolute: 1.5 10*3/uL (ref 0.7–3.1)
Lymphs: 36 %
MCH: 29.3 pg (ref 26.6–33.0)
MCHC: 31.7 g/dL (ref 31.5–35.7)
MCV: 93 fL (ref 79–97)
Monocytes Absolute: 0.4 10*3/uL (ref 0.1–0.9)
Monocytes: 10 %
Neutrophils Absolute: 2.2 10*3/uL (ref 1.4–7.0)
Neutrophils: 51 %
Platelets: 292 10*3/uL (ref 150–450)
RBC: 4.57 x10E6/uL (ref 3.77–5.28)
RDW: 13.7 % (ref 11.7–15.4)
WBC: 4.3 10*3/uL (ref 3.4–10.8)

## 2019-04-22 LAB — SARS-COV-2 ANTIBODY, IGM: SARS-CoV-2 Antibody, IgM: NEGATIVE

## 2019-04-22 LAB — LIPID PANEL
Chol/HDL Ratio: 3.5 ratio (ref 0.0–4.4)
Cholesterol, Total: 234 mg/dL — ABNORMAL HIGH (ref 100–199)
HDL: 66 mg/dL (ref >39)
LDL Calculated: 147 mg/dL — ABNORMAL HIGH (ref 0–99)
Triglycerides: 103 mg/dL (ref 0–149)
VLDL Cholesterol Cal: 21 mg/dL (ref 5–40)

## 2019-04-22 LAB — SARS-COV-2 ANTIBODY, IGA: SARS-CoV-2 Antibody, IgA: NEGATIVE

## 2019-04-26 ENCOUNTER — Encounter: Payer: Self-pay | Admitting: Physician Assistant

## 2019-04-27 ENCOUNTER — Telehealth (INDEPENDENT_AMBULATORY_CARE_PROVIDER_SITE_OTHER): Payer: Managed Care, Other (non HMO) | Admitting: Family Medicine

## 2019-04-27 ENCOUNTER — Encounter: Payer: Self-pay | Admitting: Family Medicine

## 2019-04-27 DIAGNOSIS — R55 Syncope and collapse: Secondary | ICD-10-CM

## 2019-04-27 MED ORDER — ESTRADIOL 0.025 MG/24HR TD PTTW: 1.0000 | MEDICATED_PATCH | TRANSDERMAL | 12 refills | Status: DC

## 2019-04-27 NOTE — Assessment & Plan Note (Signed)
Worsening--will begin HRT at low dose--may stop vaginal estrogen. Will re-eval in 1 month to see if this is achieving desired effect. If not, will consider increasing.

## 2019-04-27 NOTE — Progress Notes (Signed)
TELEHEALTH GYNECOLOGY VIRTUAL VIDEO VISIT ENCOUNTER NOTE  Provider location: Center for [employer] at [facility]   I connected with Theresa Walton on 04/27/19 at  2:45 PM EDT by MyChart Video Encounter at home and verified that I am speaking with the correct person using two identifiers.   I discussed the limitations, risks, security and privacy concerns of performing an evaluation and management service by telephone and the availability of in person appointments. I also discussed with the patient that there may be a patient responsible charge related to this service. The patient expressed understanding and agreed to proceed.   History:  Theresa Walton is a 53 y.o. G0P0000 female being evaluated today for follow-up. She is having hot flashes. We checked her thyroid. Has previously been on vaginal estrogen only following hysterectomy to decrease UTIs in the last year, but hot flashes are much worse. We discussed lifestyle modifications last time. This is not helping. She denies any abnormal vaginal discharge, bleeding, pelvic pain or other concerns.       Past Medical History:  Diagnosis Date  . Basal cell carcinoma (BCC) of jawline 06/26/2018  . 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[place])   . Genital herpes   . Hypothyroidism   . Seasonal allergies   . Syncope and collapse   . Thyroid disease    hypothyroidism   Past Surgical History:  Procedure Laterality Date  . ABDOMINAL HYSTERECTOMY    . ANTERIOR CRUCIATE LIGAMENT REPAIR Right 2012  . ARTHROSCOPIC REPAIR ACL  2013   Right knee  . AUGMENTATION MAMMAPLASTY Bilateral 2000   saline  . BLADDER REPAIR    . BREAST ENHANCEMENT SURGERY  1995  . COLONOSCOPY WITH PROPOFOL N/A 08/29/2017   Procedure: COLONOSCOPY WITH PROPOFOL;  Surgeon: Jonathon Bellows, MD;  Location: [facility] ENDOSCOPY;  Service: Gastroenterology;  Laterality: N/A;  . CYSTOCELE REPAIR  2013  . ENDOMETRIAL ABLATION  2013  . [place], 2001  . PARTIAL  HYSTERECTOMY  2015  . PARTIAL HYSTERECTOMY Bilateral 2014  . RECTOPEXY  2011  . TONSILECTOMY, ADENOIDECTOMY, BILATERAL MYRINGOTOMY AND TUBES  1984  . TOOTH EXTRACTION     The following portions of the patient's history were reviewed and updated as appropriate: allergies, current medications, past family history, past medical history, past social history, past surgical history and problem list.    Review of Systems:  Pertinent items noted in HPI and remainder of comprehensive ROS otherwise negative.  Physical Exam:   General:  Alert, oriented and cooperative. Patient appears to be in no acute distress.  Mental Status: Normal mood and affect. Normal behavior. Normal judgment and thought content.   Respiratory: Normal respiratory effort, no problems with respiration noted  Rest of physical exam deferred due to type of encounter  Labs and Imaging Results for orders placed or performed in visit on 04/21/19 (from the past 336 hour(s))  CBC w/Diff/Platelet   Collection Time: 04/21/19  8:51 AM  Result Value Ref Range   WBC 4.3 3.4 - 10.8 x10E3/uL   RBC 4.57 3.77 - 5.28 x10E6/uL   Hemoglobin 13.4 11.1 - 15.9 g/dL   Hematocrit 42.3 34.0 - 46.6 %   MCV 93 79 - 97 fL   MCH 29.3 26.6 - 33.0 pg   MCHC 31.7 31.5 - 35.7 g/dL   RDW 13.7 11.7 - 15.4 %   Platelets 292 150 - 450 x10E3/uL   Neutrophils 51 Not Estab. %   Lymphs 36 Not Estab. %  Monocytes 10 Not Estab. %   Eos 2 Not Estab. %   Basos 1 Not Estab. %   Neutrophils Absolute 2.2 1.4 - 7.0 x10E3/uL   Lymphocytes Absolute 1.5 0.7 - 3.1 x10E3/uL   Monocytes Absolute 0.4 0.1 - 0.9 x10E3/uL   EOS (ABSOLUTE) 0.1 0.0 - 0.4 x10E3/uL   Basophils Absolute 0.0 0.0 - 0.2 x10E3/uL   Immature Granulocytes 0 Not Estab. %   Immature Grans (Abs) 0.0 0.0 - 0.1 x10E3/uL  Comprehensive Metabolic Panel (CMET)   Collection Time: 04/21/19  8:51 AM  Result Value Ref Range   Glucose 83 65 - 99 mg/dL   BUN 9 6 - 24 mg/dL   Creatinine, Ser 0.75 0.57 -  1.00 mg/dL   GFR calc non Af Amer 91 >59 mL/min/1.73   GFR calc Af Amer 105 >59 mL/min/1.73   BUN/Creatinine Ratio 12 9 - 23   Sodium 140 134 - 144 mmol/L   Potassium 4.2 3.5 - 5.2 mmol/L   Chloride 103 96 - 106 mmol/L   CO2 23 20 - 29 mmol/L   Calcium 10.0 8.7 - 10.2 mg/dL   Total Protein 7.1 6.0 - 8.5 g/dL   Albumin 5.1 (H) 3.8 - 4.9 g/dL   Globulin, Total 2.0 1.5 - 4.5 g/dL   Albumin/Globulin Ratio 2.6 (H) 1.2 - 2.2   Bilirubin Total 0.5 0.0 - 1.2 mg/dL   Alkaline Phosphatase 57 39 - 117 IU/L   AST 21 0 - 40 IU/L   ALT 21 0 - 32 IU/L  Lipid Profile   Collection Time: 04/21/19  8:51 AM  Result Value Ref Range   Cholesterol, Total 234 (H) 100 - 199 mg/dL   Triglycerides 103 0 - 149 mg/dL   HDL 66 >39 mg/dL   VLDL Cholesterol Cal 21 5 - 40 mg/dL   LDL Calculated 147 (H) 0 - 99 mg/dL   Chol/HDL Ratio 3.5 0.0 - 4.4 ratio  HgB A1c   Collection Time: 04/21/19  8:51 AM  Result Value Ref Range   Hgb A1c MFr Bld 5.3 4.8 - 5.6 %   Est. average glucose Bld gHb Est-mCnc 105 mg/dL  SARS-CoV-2 Antibody, IgA   Collection Time: 04/21/19  8:51 AM  Result Value Ref Range   SARS-CoV-2 Antibody, IgA Negative Negative  SARS-CoV-2 Antibody, IgM   Collection Time: 04/21/19  8:51 AM  Result Value Ref Range   SARS-CoV-2 Antibody, IgM Negative Negative  Results for orders placed or performed in visit on 04/13/19 (from the past 336 hour(s))  T4, free   Collection Time: 04/19/19 11:08 AM  Result Value Ref Range   Free T4 1.15 0.82 - 1.77 ng/dL  TSH   Collection Time: 04/19/19 11:08 AM  Result Value Ref Range   TSH 4.250 0.450 - 4.500 uIU/mL  T3, free   Collection Time: 04/19/19 11:08 AM  Result Value Ref Range   T3, Free 2.8 2.0 - 4.4 pg/mL        Assessment and Plan:     Problem List Items Addressed This Visit      Unprioritized   Vasovagal symptom - Primary    Worsening--will begin HRT at low dose--may stop vaginal estrogen. Will re-eval in 1 month to see if this is achieving  desired effect. If not, will consider increasing.         I discussed the assessment and treatment plan with the patient. The patient was provided an opportunity to ask questions and all were answered. The patient agreed  with the plan and demonstrated an understanding of the instructions.   The patient was advised to call back or seek an in-person evaluation/go to the ED if the symptoms worsen or if the condition fails to improve as anticipated.  I provided 9 minutes of face-to-face time during this encounter.  Return in about 4 weeks (around 05/25/2019) for virtual.   Donnamae Jude, [place] [facility], [place]

## 2019-04-28 ENCOUNTER — Telehealth: Payer: Self-pay | Admitting: Physician Assistant

## 2019-04-28 NOTE — Telephone Encounter (Signed)
Patient advised that Part A is Participant Information. She requested for me to fill out that part for her and on the signature part just to print her name.

## 2019-04-28 NOTE — Telephone Encounter (Signed)
Pt's yearly biometric screening through [facility] is missing:  Lab test results. Section A of physician form is incomplete. Copy of test results.  Please advise.  Thanks, [employer]

## 2019-05-07 ENCOUNTER — Ambulatory Visit (INDEPENDENT_AMBULATORY_CARE_PROVIDER_SITE_OTHER): Payer: Managed Care, Other (non HMO)

## 2019-05-07 ENCOUNTER — Ambulatory Visit: Payer: Managed Care, Other (non HMO) | Admitting: Podiatry

## 2019-05-07 ENCOUNTER — Other Ambulatory Visit: Payer: Self-pay

## 2019-05-07 ENCOUNTER — Encounter: Payer: Self-pay | Admitting: Podiatry

## 2019-05-07 VITALS — Temp 97.9°F

## 2019-05-07 DIAGNOSIS — M7742 Metatarsalgia, left foot: Secondary | ICD-10-CM | POA: Diagnosis not present

## 2019-05-07 DIAGNOSIS — M778 Other enthesopathies, not elsewhere classified: Secondary | ICD-10-CM

## 2019-05-10 NOTE — Progress Notes (Signed)
   HPI: 53 y.o. female presenting today as a new patient with a chief complaint of a nodule located on the plantar aspect of the left forefoot that appeared about two months ago. She denies any significant pain of the nodule but reports tenderness to the lateral aspect of the foot. Walking increases the sensitivity. She has not done anything for treatment. Patient is here for further evaluation and treatment.   Past Medical History:  Diagnosis Date  . Basal cell carcinoma (BCC) of jawline 06/26/2018  . 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[place])   . Genital herpes   . Hypothyroidism   . Seasonal allergies   . Syncope and collapse   . Thyroid disease    hypothyroidism     Physical Exam: General: The patient is alert and oriented x3 in no acute distress.  Dermatology: Skin is warm, dry and supple bilateral lower extremities. Negative for open lesions or macerations.  Vascular: Palpable pedal pulses bilaterally. No edema or erythema noted. Capillary refill within normal limits.  Neurological: Epicritic and protective threshold grossly intact bilaterally.   Musculoskeletal Exam: Palpable 3rd metatarsal head of the left foot. Nontender to palpation. Range of motion within normal limits to all pedal and ankle joints bilateral. Muscle strength 5/5 in all groups bilateral.   Radiographic Exam:  Normal osseous mineralization. Joint spaces preserved. No fracture/dislocation/boney destruction.    Assessment: 1. Prominent 3rd metatarsal head left    Plan of Care:  1. Patient evaluated. X-Rays reviewed.  2. Recommended good shoe gear.  3. Return to clinic as needed if it becomes symptomatic.       Edrick Kins, DPM [facility]  Dr. Edrick Kins, DPM    [address], [place] [address]                Office [phone]  Fax [phone]

## 2019-05-17 ENCOUNTER — Encounter: Payer: Self-pay | Admitting: Internal Medicine

## 2019-05-17 NOTE — Telephone Encounter (Signed)
This message came to me but addressed to Dr. Devona Konig

## 2019-05-27 ENCOUNTER — Other Ambulatory Visit: Payer: Self-pay

## 2019-05-27 ENCOUNTER — Encounter: Payer: Self-pay | Admitting: Internal Medicine

## 2019-05-27 ENCOUNTER — Ambulatory Visit: Payer: Managed Care, Other (non HMO) | Admitting: Internal Medicine

## 2019-05-27 VITALS — BP 113/76 | HR 74 | Resp 16 | Ht 62.0 in | Wt 134.0 lb

## 2019-05-27 DIAGNOSIS — G4710 Hypersomnia, unspecified: Secondary | ICD-10-CM | POA: Diagnosis not present

## 2019-05-27 DIAGNOSIS — M2550 Pain in unspecified joint: Secondary | ICD-10-CM

## 2019-05-27 DIAGNOSIS — E039 Hypothyroidism, unspecified: Secondary | ICD-10-CM | POA: Diagnosis not present

## 2019-05-27 NOTE — Progress Notes (Signed)
[facility] [place], [place] [id number]  Pulmonary Sleep Medicine   Office Visit Note  Patient Name: Theresa Walton DOB: December 18, 1965 MRN [id number]  Date of Service: 05/27/2019  Complaints/HPI: Patient is here because she is having difficulty staying awake.  The patient states that she feels tired during the daytime she states that she has to actually lie down at times.  Patient states she has also difficulty staying awake during meetings.  She feels as though she is sleeping through the night but does not feel like she is getting a full restful sleep.  One thing she did note was that if she does take a nap she does not feel very refreshed.  She states she is sleeping enough hours so that he does not feel like she is not getting enough sleep but her sleep is not deep.  The patient also has had hormonal issues which she was seeing her women's health physician.  They adjusted the estradiol.  Is also complains about having joint pains body aches muscle aches and she has been using Flexeril as needed for that.  She feels that the Flexeril did not really put her to sleep she does not feel particularly tired after using the Flexeril so she is not thinking that this is contributing to her daytime somnolence.  ROS  General: (-) fever, (-) chills, (-) night sweats, (-) weakness Skin: (-) rashes, (-) itching,. Eyes: (-) visual changes, (-) redness, (-) itching. Nose and Sinuses: (-) nasal stuffiness or itchiness, (-) postnasal drip, (-) nosebleeds, (-) sinus trouble. Mouth and Throat: (-) sore throat, (-) hoarseness. Neck: (-) swollen glands, (-) enlarged thyroid, (-) neck pain. Respiratory: - cough, (-) bloody sputum, - shortness of breath, - wheezing. Cardiovascular: - ankle swelling, (-) chest pain. Lymphatic: (-) lymph node enlargement. Neurologic: (-) numbness, (-) tingling. Psychiatric: (-) anxiety, (-) depression   Current Medication: Outpatient Encounter  Medications as of 05/27/2019  Medication Sig  . cyclobenzaprine (FLEXERIL) 5 MG tablet TAKE 1 TO 2 TABLETS BY MOUTH AT BEDTIME  . EPINEPHrine (EPIPEN 2-PAK IJ) Inject as directed.  Marland Kitchen estradiol (VIVELLE-DOT) 0.025 MG/24HR Place 1 patch onto the skin 2 (two) times a week.  . Eszopiclone 3 MG TABS Take 1 tablet (3 mg total) by mouth at bedtime. Take immediately before bedtime  . levothyroxine (SYNTHROID, LEVOTHROID) 25 MCG tablet TAKE ONE TABLET BY MOUTH BEFORE BREAKFAST  . valACYclovir (VALTREX) 500 MG tablet Take 1 tablet (500 mg total) by mouth 2 (two) times daily.   No facility-administered encounter medications on file as of 05/27/2019.     Surgical History: Past Surgical History:  Procedure Laterality Date  . ABDOMINAL HYSTERECTOMY    . ANTERIOR CRUCIATE LIGAMENT REPAIR Right 2012  . ARTHROSCOPIC REPAIR ACL  2013   Right knee  . AUGMENTATION MAMMAPLASTY Bilateral 2000   saline  . BLADDER REPAIR    . BREAST ENHANCEMENT SURGERY  1995  . COLONOSCOPY WITH PROPOFOL N/A 08/29/2017   Procedure: COLONOSCOPY WITH PROPOFOL;  Surgeon: Jonathon Bellows, MD;  Location: [facility] ENDOSCOPY;  Service: Gastroenterology;  Laterality: N/A;  . CYSTOCELE REPAIR  2013  . ENDOMETRIAL ABLATION  2013  . [place], 2001  . PARTIAL HYSTERECTOMY  2015  . PARTIAL HYSTERECTOMY Bilateral 2014  . RECTOPEXY  2011  . TONSILECTOMY, ADENOIDECTOMY, BILATERAL MYRINGOTOMY AND TUBES  1984  . TOOTH EXTRACTION      Medical History: Past Medical History:  Diagnosis Date  . Basal cell carcinoma (  BCC) of jawline 06/26/2018  . 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[place])   . Genital herpes   . Hypothyroidism   . Seasonal allergies   . Syncope and collapse   . Thyroid disease    hypothyroidism    Family History: Family History  Problem Relation Age of Onset  . Pulmonary fibrosis Mother   . Healthy Sister   . Healthy Brother   . Healthy Sister   . Lung cancer Paternal Grandmother   . Arrhythmia Father   .  Heart disease Father   . Prostate cancer Neg Hx   . Bladder Cancer Neg Hx   . Kidney cancer Neg Hx   . Breast cancer Neg Hx     Social History: Social History   Socioeconomic History  . Marital status: Divorced    Spouse name: Not on file  . Number of children: Not on file  . Years of education: Not on file  . Highest education level: Not on file  Occupational History  . Not on file  Social Needs  . Financial resource strain: Not on file  . Food insecurity    Worry: Not on file    Inability: Not on file  . Transportation needs    Medical: Not on file    Non-medical: Not on file  Tobacco Use  . Smoking status: Former Smoker    Types: Cigarettes    Quit date: 05/15/2015    Years since quitting: 4.0  . Smokeless tobacco: Never Used  Substance and Sexual Activity  . Alcohol use: No    Alcohol/week: 0.0 standard drinks  . Drug use: No  . Sexual activity: Never    Partners: Male    Birth control/protection: Surgical  Lifestyle  . Physical activity    Days per week: Not on file    Minutes per session: Not on file  . Stress: Not on file  Relationships  . Social Herbalist on phone: Not on file    Gets together: Not on file    Attends religious service: Not on file    Active member of club or organization: Not on file    Attends meetings of clubs or organizations: Not on file    Relationship status: Not on file  . Intimate partner violence    Fear of current or ex partner: Not on file    Emotionally abused: Not on file    Physically abused: Not on file    Forced sexual activity: Not on file  Other Topics Concern  . Not on file  Social History Narrative  . Not on file    Vital Signs: Blood pressure 113/76, pulse 74, resp. rate 16, height 5\' 2"  (1.575 m), weight 134 lb (60.8 kg), SpO2 99 %.  Examination: General Appearance: The patient is well-developed, well-nourished, and in no distress. Skin: Gross inspection of skin unremarkable. Head:  normocephalic, no gross deformities. Eyes: no gross deformities noted. ENT: ears appear grossly normal no exudates. Neck: Supple. No thyromegaly. No LAD. Respiratory: no rhonchi noted at this time Cardiovascular: Normal S1 and S2 without murmur or rub. Extremities: No cyanosis. pulses are equal. Neurologic: Alert and oriented. No involuntary movements.  LABS: Recent Results (from the past 2160 hour(s))  CBC with Differential/Platelet     Status: None   Collection Time: 03/19/19  4:33 PM  Result Value Ref Range   WBC 7.3 3.4 - 10.8 x10E3/uL   RBC 4.51 3.77 - 5.28 x10E6/uL   Hemoglobin 13.5 11.1 -  15.9 g/dL   Hematocrit 40.6 34.0 - 46.6 %   MCV 90 79 - 97 fL   MCH 29.9 26.6 - 33.0 pg   MCHC 33.3 31.5 - 35.7 g/dL   RDW 12.7 11.7 - 15.4 %   Platelets 364 150 - 450 x10E3/uL   Neutrophils 49 Not Estab. %   Lymphs 39 Not Estab. %   Monocytes 11 Not Estab. %   Eos 1 Not Estab. %   Basos 0 Not Estab. %   Neutrophils Absolute 3.6 1.4 - 7.0 x10E3/uL   Lymphocytes Absolute 2.8 0.7 - 3.1 x10E3/uL   Monocytes Absolute 0.8 0.1 - 0.9 x10E3/uL   EOS (ABSOLUTE) 0.1 0.0 - 0.4 x10E3/uL   Basophils Absolute 0.0 0.0 - 0.2 x10E3/uL   Immature Granulocytes 0 Not Estab. %   Immature Grans (Abs) 0.0 0.0 - 0.1 x10E3/uL  Culture, Group A Strep     Status: None   Collection Time: 03/23/19 12:12 PM   Specimen: Throat   PX  Result Value Ref Range   Strep A Culture Negative   Novel Coronavirus, NAA ([facility])     Status: None   Collection Time: 04/13/19  9:03 AM  Result Value Ref Range   SARS-CoV-2, NAA Not Detected Not Detected    Comment: This test was developed and its performance characteristics determined by [employer]. This test has not been FDA cleared or approved. This test has been authorized by FDA under an Emergency Use Authorization (EUA). This test is only authorized for the duration of time the declaration that circumstances exist justifying the authorization of the emergency  use of in vitro diagnostic tests for detection of SARS-CoV-2 virus and/or diagnosis of COVID-19 infection under section 564(b)(1) of the Act, 21 U.S.C. 706CBJ-6(E)(8), unless the authorization is terminated or revoked sooner. When diagnostic testing is negative, the possibility of a false negative result should be considered in the context of a patient's recent exposures and the presence of clinical signs and symptoms consistent with COVID-19. An individual without symptoms of COVID-19 and who is not shedding SARS-CoV-2 virus would expect to have a negative (not detected) result in this assay.   T4, free     Status: None   Collection Time: 04/19/19 11:08 AM  Result Value Ref Range   Free T4 1.15 0.82 - 1.77 ng/dL  TSH     Status: None   Collection Time: 04/19/19 11:08 AM  Result Value Ref Range   TSH 4.250 0.450 - 4.500 uIU/mL  T3, free     Status: None   Collection Time: 04/19/19 11:08 AM  Result Value Ref Range   T3, Free 2.8 2.0 - 4.4 pg/mL  CBC w/Diff/Platelet     Status: None   Collection Time: 04/21/19  8:51 AM  Result Value Ref Range   WBC 4.3 3.4 - 10.8 x10E3/uL   RBC 4.57 3.77 - 5.28 x10E6/uL   Hemoglobin 13.4 11.1 - 15.9 g/dL   Hematocrit 42.3 34.0 - 46.6 %   MCV 93 79 - 97 fL   MCH 29.3 26.6 - 33.0 pg   MCHC 31.7 31.5 - 35.7 g/dL   RDW 13.7 11.7 - 15.4 %   Platelets 292 150 - 450 x10E3/uL   Neutrophils 51 Not Estab. %   Lymphs 36 Not Estab. %   Monocytes 10 Not Estab. %   Eos 2 Not Estab. %   Basos 1 Not Estab. %   Neutrophils Absolute 2.2 1.4 - 7.0 x10E3/uL   Lymphocytes Absolute  1.5 0.7 - 3.1 x10E3/uL   Monocytes Absolute 0.4 0.1 - 0.9 x10E3/uL   EOS (ABSOLUTE) 0.1 0.0 - 0.4 x10E3/uL   Basophils Absolute 0.0 0.0 - 0.2 x10E3/uL   Immature Granulocytes 0 Not Estab. %   Immature Grans (Abs) 0.0 0.0 - 0.1 x10E3/uL  Comprehensive Metabolic Panel (CMET)     Status: Abnormal   Collection Time: 04/21/19  8:51 AM  Result Value Ref Range   Glucose 83 65 - 99 mg/dL    BUN 9 6 - 24 mg/dL   Creatinine, Ser 0.75 0.57 - 1.00 mg/dL   GFR calc non Af Amer 91 >59 mL/min/1.73   GFR calc Af Amer 105 >59 mL/min/1.73   BUN/Creatinine Ratio 12 9 - 23   Sodium 140 134 - 144 mmol/L   Potassium 4.2 3.5 - 5.2 mmol/L   Chloride 103 96 - 106 mmol/L   CO2 23 20 - 29 mmol/L   Calcium 10.0 8.7 - 10.2 mg/dL   Total Protein 7.1 6.0 - 8.5 g/dL   Albumin 5.1 (H) 3.8 - 4.9 g/dL   Globulin, Total 2.0 1.5 - 4.5 g/dL   Albumin/Globulin Ratio 2.6 (H) 1.2 - 2.2   Bilirubin Total 0.5 0.0 - 1.2 mg/dL   Alkaline Phosphatase 57 39 - 117 IU/L   AST 21 0 - 40 IU/L   ALT 21 0 - 32 IU/L  Lipid Profile     Status: Abnormal   Collection Time: 04/21/19  8:51 AM  Result Value Ref Range   Cholesterol, Total 234 (H) 100 - 199 mg/dL   Triglycerides 103 0 - 149 mg/dL   HDL 66 >39 mg/dL   VLDL Cholesterol Cal 21 5 - 40 mg/dL   LDL Calculated 147 (H) 0 - 99 mg/dL   Chol/HDL Ratio 3.5 0.0 - 4.4 ratio    Comment:                                   T. Chol/HDL Ratio                                             Men  Women                               1/2 Avg.Risk  3.4    3.3                                   Avg.Risk  5.0    4.4                                2X Avg.Risk  9.6    7.1                                3X Avg.Risk 23.4   11.0   HgB A1c     Status: None   Collection Time: 04/21/19  8:51 AM  Result Value Ref Range   Hgb A1c MFr Bld 5.3 4.8 - 5.6 %    Comment:  Prediabetes: 5.7 - 6.4          Diabetes: >6.4          Glycemic control for adults with diabetes: <7.0    Est. average glucose Bld gHb Est-mCnc 105 mg/dL  SARS-CoV-2 Antibody, IgA     Status: None   Collection Time: 04/21/19  8:51 AM  Result Value Ref Range   SARS-CoV-2 Antibody, IgA Negative Negative    Comment: This sample does not contain detectable SARS-CoV-2 IgA antibodies. This negative result does not rule out SARS-CoV-2 infection. Correlation with epidemiologic risk factors and other clinical  and laboratory findings is recommended. Serologic results should not be used as the sole basis to diagnose or exclude recent SARS-CoV-2 infection.   SARS-CoV-2 Antibody, IgM     Status: None   Collection Time: 04/21/19  8:51 AM  Result Value Ref Range   SARS-CoV-2 Antibody, IgM Negative Negative    Comment: This sample does not contain detectable SARS-CoV-2 IgM antibodies. This negative result does not rule out SARS-CoV-2 infection. Correlation with epidemiologic risk factors and other clinical and laboratory findings is recommended. Serologic results should not be used as the sole basis to diagnose or exclude recent SARS-CoV-2 infection.     Radiology: Ct Soft Tissue Neck W Contrast  Result Date: 04/14/2019 CLINICAL DATA:  Acute lymphadenitis. Left submandibular lymphadenopathy for 1 month. Difficulty swallowing. EXAM: CT NECK WITH CONTRAST TECHNIQUE: Multidetector CT imaging of the neck was performed using the standard protocol following the bolus administration of intravenous contrast. CONTRAST:  10mL OMNIPAQUE IOHEXOL 300 MG/ML  SOLN COMPARISON:  None. FINDINGS: Pharynx and larynx: No evidence of mass or swelling. Patent airway. No retropharyngeal fluid. Salivary glands: No inflammation, mass, or stone. Thyroid: Subcentimeter bilateral thyroid nodules. Lymph nodes: The skin marker overlies the left lateral neck at level III. No enlarged lymph node, mass, or acute inflammatory changes are seen subjacent to the marker. More superiorly, a normal-sized left level II a lymph node measures 5 mm in short axis, slightly larger than the largest right-sided lymph node at this level. An enlarged left paratracheal lymph node near the thoracic inlet measures 8 mm in short axis (series 2, image 86). Vascular: Major vascular structures of the neck are patent. Limited intracranial: Unremarkable. Visualized orbits: Unremarkable. Mastoids and visualized paranasal sinuses: Clear. Skeleton: No suspicious  osseous lesion. Cervical disc degeneration most advanced at C5-6 where uncovertebral spurring results in moderate to severe right neural foraminal stenosis. Upper chest: Clear lung apices. Other: None. IMPRESSION: 1. No enlarged lymph nodes or other mass in the area of clinical concern. 2. 8 mm short axis left paratracheal lymph node near the thoracic inlet, indeterminate. Electronically Signed   By: Logan Bores M.D.   On: 04/14/2019 09:29    No results found.  No results found.    Assessment and Plan: Patient Active Problem List   Diagnosis Date Noted  . Edema 08/05/2018  . Impingement syndrome of shoulder region 08/05/2018  . Knee joint effusion 08/05/2018  . Old anterior cruciate ligament disruption 08/05/2018  . Patellar tendonitis 08/05/2018  . Basal cell carcinoma (BCC) of jawline 06/26/2018  . History of pubovaginal sling 04/01/2017  . Essential tremor 05/15/2016  . Allergic rhinitis 06/22/2015  . B12 deficiency 06/21/2015  . H/O: hysterectomy 06/21/2015  . Hypertriglyceridemia 06/21/2015  . Hypoglycemia 06/21/2015  . Menopausal and perimenopausal disorder 06/21/2015  . Vasovagal symptom 06/21/2015  . Insomnia 04/19/2015  . Female stress incontinence 03/24/2012  . Adult hypothyroidism 08/28/2009  .  Avitaminosis D 08/28/2009  . Chronic infection of sinus 02/01/2009  . Adaptation reaction 09/09/2008  . Genital herpes simplex type 1 infection 07/13/2008    1. Hypersomnia during daytime with what appears to be normal night time sleep she has had multiple sleep studies in the past which have not shown sleep apnea but she has had no M SLT or MW T done.  The thing that concerns me is that her symptoms do not really correlate well with sleep apnea.  She does have excessive daytime somnolence and since she has not had a M SLT I recommended that we get this done first this will be scheduled. 2. Multiple joint pains body aches generalized this could be related to her hormonal issues  however she also has not been checked for ANA ANCA and sed rate and rheumatoid factor.  She states that it does run her and her family so I will get these labs done. 3. Hypothyroidism certainly this could be contributing order TSH T3 and T4 levels will get these done and then reviewing them if medications need to be adjusted they will be adjusted.  General Counseling: I have discussed the findings of the evaluation and examination with Brinda.  I have also discussed any further diagnostic evaluation thatmay be needed or ordered today. Shelia verbalizes understanding of the findings of todays visit. We also reviewed her medications today and discussed drug interactions and side effects including but not limited excessive drowsiness and altered mental states. We also discussed that there is always a risk not just to her but also people around her. she has been encouraged to call the office with any questions or concerns that should arise related to todays visit.    Time spent: 47min  I have personally obtained a history, examined the patient, evaluated laboratory and imaging results, formulated the assessment and plan and placed orders.    Allyne Gee, MD [facility] Pulmonary and Critical Care Sleep medicine

## 2019-05-27 NOTE — Patient Instructions (Signed)

## 2019-05-29 LAB — TSH+FREE T4
Free T4: 1.23 ng/dL (ref 0.82–1.77)
TSH: 1.67 u[IU]/mL (ref 0.450–4.500)

## 2019-05-29 LAB — T3, FREE: T3, Free: 3 pg/mL (ref 2.0–4.4)

## 2019-05-29 LAB — RHEUMATOID FACTOR: Rheumatoid fact SerPl-aCnc: <10 [IU]/mL (ref 0.0–13.9)

## 2019-05-29 LAB — ANA W/REFLEX IF POSITIVE: Anti Nuclear Antibody (ANA): NEGATIVE

## 2019-05-29 LAB — T3 UPTAKE: T3 Uptake Ratio: 25 % (ref 24–39)

## 2019-05-29 LAB — SEDIMENTATION RATE: Sed Rate: 2 mm/hr (ref 0–40)

## 2019-06-10 ENCOUNTER — Other Ambulatory Visit: Payer: Self-pay | Admitting: Podiatry

## 2019-06-10 DIAGNOSIS — M7742 Metatarsalgia, left foot: Secondary | ICD-10-CM

## 2019-06-28 ENCOUNTER — Ambulatory Visit: Payer: Managed Care, Other (non HMO) | Attending: Internal Medicine

## 2019-06-28 ENCOUNTER — Encounter (INDEPENDENT_AMBULATORY_CARE_PROVIDER_SITE_OTHER): Payer: Managed Care, Other (non HMO) | Admitting: Internal Medicine

## 2019-06-28 DIAGNOSIS — G4700 Insomnia, unspecified: Secondary | ICD-10-CM | POA: Insufficient documentation

## 2019-06-28 DIAGNOSIS — G4710 Hypersomnia, unspecified: Secondary | ICD-10-CM | POA: Diagnosis not present

## 2019-06-29 ENCOUNTER — Ambulatory Visit: Payer: Managed Care, Other (non HMO) | Attending: Internal Medicine

## 2019-06-29 ENCOUNTER — Encounter (INDEPENDENT_AMBULATORY_CARE_PROVIDER_SITE_OTHER): Payer: Managed Care, Other (non HMO) | Admitting: Internal Medicine

## 2019-06-29 ENCOUNTER — Other Ambulatory Visit: Payer: Self-pay

## 2019-06-29 DIAGNOSIS — G4700 Insomnia, unspecified: Secondary | ICD-10-CM | POA: Diagnosis not present

## 2019-06-29 DIAGNOSIS — G4710 Hypersomnia, unspecified: Secondary | ICD-10-CM

## 2019-06-30 ENCOUNTER — Other Ambulatory Visit: Payer: Self-pay

## 2019-07-06 ENCOUNTER — Other Ambulatory Visit: Payer: Self-pay

## 2019-07-06 ENCOUNTER — Encounter: Payer: Self-pay | Admitting: Physician Assistant

## 2019-07-06 ENCOUNTER — Ambulatory Visit: Payer: Managed Care, Other (non HMO) | Admitting: Physician Assistant

## 2019-07-06 VITALS — BP 114/74 | HR 87 | Temp 96.6°F | Resp 16 | Wt 130.0 lb

## 2019-07-06 DIAGNOSIS — R232 Flushing: Secondary | ICD-10-CM

## 2019-07-06 DIAGNOSIS — N6310 Unspecified lump in the right breast, unspecified quadrant: Secondary | ICD-10-CM | POA: Diagnosis not present

## 2019-07-06 NOTE — Progress Notes (Signed)
Patient: Theresa Walton Female    DOB: 01-26-1966   53 y.o.   MRN: [id number] Visit Date: 07/06/2019  Today's Provider: Mar Daring, PA-C   Chief Complaint  Patient presents with  . Breast Mass   Subjective:     HPI   Patient states she was in the shower last week and felt a lump in her right breast. Patient states lump is at between 12-1 o'clock near the nipple.   Patient also states she has been having increased hot and cold flashes. She is also being worked up for lymphadenopathy (getting repeat imaging in December). She states she feels her neck just feels full. She is on levothyroxine for hypothyroid and also using estrogen patch from GYN for menopause. She thinks her hot/cold flashes are worse and that she is losing hair. She would like to have labs checked to see if her hypothalmus is working correctly. She works for [facility].    Allergies  Allergen Reactions  . Cefaclor Anaphylaxis  . Cephalosporins Anaphylaxis  . Lubiprostone     Other reaction(s): Angioedema Tongue Swelling x 2 times.  Other reaction(s): Angioedema Tongue Swelling x 2 times.   . Celecoxib   . Lac Bovis     Other reaction(s): Other (See Comments) GI Upset to milk protein  . Apple Nausea And Vomiting  . Pseudoephedrine Palpitations    Tachycardia     Current Outpatient Medications:  .  cyclobenzaprine (FLEXERIL) 5 MG tablet, TAKE 1 TO 2 TABLETS BY MOUTH AT BEDTIME, Disp: 30 tablet, Rfl: 5 .  estradiol (VIVELLE-DOT) 0.025 MG/24HR, Place 1 patch onto the skin 2 (two) times a week., Disp: 8 patch, Rfl: 12 .  Eszopiclone 3 MG TABS, Take 1 tablet (3 mg total) by mouth at bedtime. Take immediately before bedtime, Disp: 30 tablet, Rfl: 2 .  levothyroxine (SYNTHROID, LEVOTHROID) 25 MCG tablet, TAKE ONE TABLET BY MOUTH BEFORE BREAKFAST, Disp: 90 tablet, Rfl: 1 .  Triprolidine-Pseudoephedrine (ANTIHISTAMINE PO), Take by mouth daily., Disp: , Rfl:  .  valACYclovir (VALTREX) 500 MG tablet, Take  1 tablet (500 mg total) by mouth 2 (two) times daily., Disp: 180 tablet, Rfl: 1 .  EPINEPHrine (EPIPEN 2-PAK IJ), Inject as directed., Disp: , Rfl:   Review of Systems  Constitutional: Positive for chills and diaphoresis. Negative for appetite change, fatigue and fever.  Respiratory: Negative for chest tightness and shortness of breath.   Cardiovascular: Negative for chest pain and palpitations.  Gastrointestinal: Negative for abdominal pain, nausea and vomiting.  Genitourinary: Negative.   Neurological: Negative for dizziness, weakness and headaches.    Social History   Tobacco Use  . Smoking status: Former Smoker    Types: Cigarettes    Quit date: 05/15/2015    Years since quitting: 4.1  . Smokeless tobacco: Never Used  Substance Use Topics  . Alcohol use: No    Alcohol/week: 0.0 standard drinks      Objective:   BP 114/74 (BP Location: Left Arm, Patient Position: Sitting, Cuff Size: Large)   Pulse 87   Temp (!) 96.6 F (35.9 C) (Other (Comment))   Resp 16   Wt 130 lb (59 kg)   SpO2 99%   BMI 23.78 kg/m  Vitals:   07/06/19 1451  BP: 114/74  Pulse: 87  Resp: 16  Temp: (!) 96.6 F (35.9 C)  TempSrc: Other (Comment)  SpO2: 99%  Weight: 130 lb (59 kg)  Body mass index is 23.78 kg/m.   Physical  Exam Vitals signs reviewed.  Constitutional:      General: She is not in acute distress.    Appearance: Normal appearance. She is well-developed. She is not ill-appearing or diaphoretic.  Neck:     Musculoskeletal: Normal range of motion and neck supple.     Thyroid: No thyromegaly.     Vascular: No JVD.     Trachea: No tracheal deviation.  Cardiovascular:     Rate and Rhythm: Normal rate and regular rhythm.     Heart sounds: Normal heart sounds. No murmur. No friction rub. No gallop.   Pulmonary:     Effort: Pulmonary effort is normal. No respiratory distress.     Breath sounds: Normal breath sounds. No wheezing or rales.  Chest:    Lymphadenopathy:      Cervical: No cervical adenopathy.  Neurological:     Mental Status: She is alert.      No results found for any visits on 07/06/19.     Assessment & Plan    1. Breast mass, right [place] ordered for abnormalities noted.  - US BREAST LTD UNI RIGHT INC AXILLA; Future  2. Hot flashes Will check labs as below and f/u pending results. - Arginine vasopressin hormone - Thyroid Panel With TSH - [place], PA-C  [facility]

## 2019-07-07 ENCOUNTER — Other Ambulatory Visit: Payer: Self-pay | Admitting: Physician Assistant

## 2019-07-07 DIAGNOSIS — N6310 Unspecified lump in the right breast, unspecified quadrant: Secondary | ICD-10-CM

## 2019-07-12 ENCOUNTER — Ambulatory Visit
Admission: RE | Admit: 2019-07-12 | Discharge: 2019-07-12 | Disposition: A | Discharge status: Home / Self Care | Payer: Managed Care, Other (non HMO) | Source: Ambulatory Visit | Attending: Physician Assistant | Admitting: Physician Assistant

## 2019-07-12 ENCOUNTER — Encounter: Payer: Self-pay | Admitting: Physician Assistant

## 2019-07-12 DIAGNOSIS — N6310 Unspecified lump in the right breast, unspecified quadrant: Secondary | ICD-10-CM

## 2019-07-13 ENCOUNTER — Ambulatory Visit: Payer: Managed Care, Other (non HMO) | Admitting: Internal Medicine

## 2019-07-13 ENCOUNTER — Other Ambulatory Visit: Payer: Self-pay | Admitting: Physician Assistant

## 2019-07-13 ENCOUNTER — Other Ambulatory Visit: Payer: Self-pay

## 2019-07-13 ENCOUNTER — Encounter: Payer: Self-pay | Admitting: Internal Medicine

## 2019-07-13 ENCOUNTER — Encounter: Payer: Self-pay | Admitting: Physician Assistant

## 2019-07-13 VITALS — Ht 62.0 in | Wt 130.0 lb

## 2019-07-13 DIAGNOSIS — J301 Allergic rhinitis due to pollen: Secondary | ICD-10-CM | POA: Diagnosis not present

## 2019-07-13 DIAGNOSIS — G4700 Insomnia, unspecified: Secondary | ICD-10-CM

## 2019-07-13 DIAGNOSIS — G4710 Hypersomnia, unspecified: Secondary | ICD-10-CM | POA: Diagnosis not present

## 2019-07-13 NOTE — Progress Notes (Signed)
[facility] [place], [place] [address]  Internal MEDICINE  Telephone Visit  Patient Name: Theresa Walton  [id number]  [id number]  Date of Service: 07/13/2019  I connected with the patient at [phone] by telephone and verified the patients identity using two identifiers.   I discussed the limitations, risks, security and privacy concerns of performing an evaluation and management service by telephone and the availability of in person appointments. I also discussed with the patient that there may be a patient responsible charge related to the service.  The patient expressed understanding and agrees to proceed.    No chief complaint on file.   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  She reports she had study on 07/01/2019.  The study showed her sleep latency was 13 minutes, and did not show any narcolepsy.  She has had trouble sleeping for many years. She currently takes Lunesta and melatonin. She does not typically sleep during the day, in hopes that she will sleep at night.  She reports she sleeps about 7-8 hours at night, however does not   Current Medication: Outpatient Encounter Medications as of 07/13/2019  Medication Sig  . cyclobenzaprine (FLEXERIL) 5 MG tablet TAKE 1 TO 2 TABLETS BY MOUTH AT BEDTIME  . EPINEPHrine (EPIPEN 2-PAK IJ) Inject as directed.  Marland Kitchen estradiol (VIVELLE-DOT) 0.025 MG/24HR Place 1 patch onto the skin 2 (two) times a week.  . Eszopiclone 3 MG TABS Take 1 tablet (3 mg total) by mouth at bedtime. Take immediately before bedtime  . levothyroxine (SYNTHROID, LEVOTHROID) 25 MCG tablet TAKE ONE TABLET BY MOUTH BEFORE BREAKFAST  . Triprolidine-Pseudoephedrine (ANTIHISTAMINE PO) Take by mouth daily.  . valACYclovir (VALTREX) 500 MG tablet Take 1 tablet (500 mg total) by mouth 2 (two) times daily.   No facility-administered encounter medications on file as of 07/13/2019.     Surgical History: Past Surgical History:  Procedure Laterality Date  .  ABDOMINAL HYSTERECTOMY    . ANTERIOR CRUCIATE LIGAMENT REPAIR Right 2012  . ARTHROSCOPIC REPAIR ACL  2013   Right knee  . AUGMENTATION MAMMAPLASTY Bilateral 2000   saline  . BLADDER REPAIR    . BREAST ENHANCEMENT SURGERY  1995  . COLONOSCOPY WITH PROPOFOL N/A 08/29/2017   Procedure: COLONOSCOPY WITH PROPOFOL;  Surgeon: Jonathon Bellows, MD;  Location: [facility] ENDOSCOPY;  Service: Gastroenterology;  Laterality: N/A;  . CYSTOCELE REPAIR  2013  . ENDOMETRIAL ABLATION  2013  . [place], 2001  . PARTIAL HYSTERECTOMY  2015  . PARTIAL HYSTERECTOMY Bilateral 2014  . RECTOPEXY  2011  . TONSILECTOMY, ADENOIDECTOMY, BILATERAL MYRINGOTOMY AND TUBES  1984  . TOOTH EXTRACTION      Medical History: Past Medical History:  Diagnosis Date  . Basal cell carcinoma (BCC) of jawline 06/26/2018  . 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 Genital herpes   . Hypothyroidism   . Seasonal allergies   . Syncope and collapse   . Thyroid disease    hypothyroidism    Family History: Family History  Problem Relation Age of Onset  . Pulmonary fibrosis Mother   . Healthy Sister   . Healthy Brother   . Healthy Sister   . Lung cancer Paternal Grandmother   . Arrhythmia Father   . Heart disease Father   . Prostate cancer Neg Hx   . Bladder Cancer Neg Hx   . Kidney cancer Neg Hx   . Breast cancer Neg Hx  Social History   Socioeconomic History  . Marital status: Divorced    Spouse name: Not on file  . Number of children: Not on file  . Years of education: Not on file  . Highest education level: Not on file  Occupational History  . Not on file  Social Needs  . Financial resource strain: Not on file  . Food insecurity    Worry: Not on file    Inability: Not on file  . Transportation needs    Medical: Not on file    Non-medical: Not on file  Tobacco Use  . Smoking status: Former Smoker    Types: Cigarettes    Quit date: 05/15/2015    Years since quitting: 4.1  . Smokeless  tobacco: Never Used  Substance and Sexual Activity  . Alcohol use: No    Alcohol/week: 0.0 standard drinks  . Drug use: No  . Sexual activity: Never    Partners: Male    Birth control/protection: Surgical  Lifestyle  . Physical activity    Days per week: Not on file    Minutes per session: Not on file  . Stress: Not on file  Relationships  . Social Herbalist on phone: Not on file    Gets together: Not on file    Attends religious service: Not on file    Active member of club or organization: Not on file    Attends meetings of clubs or organizations: Not on file    Relationship status: Not on file  . Intimate partner violence    Fear of current or ex partner: Not on file    Emotionally abused: Not on file    Physically abused: Not on file    Forced sexual activity: Not on file  Other Topics Concern  . Not on file  Social History Narrative  . Not on file      Review of Systems  Constitutional: Negative for chills, fatigue and unexpected weight change.  HENT: Negative for congestion, rhinorrhea, sneezing and sore throat.   Eyes: Negative for photophobia, pain and redness.  Respiratory: Negative for cough, chest tightness and shortness of breath.   Cardiovascular: Negative for chest pain and palpitations.  Gastrointestinal: Negative for abdominal pain, constipation, diarrhea, nausea and vomiting.  Endocrine: Negative.   Genitourinary: Negative for dysuria and frequency.  Musculoskeletal: Negative for arthralgias, back pain, joint swelling and neck pain.  Skin: Negative for rash.  Allergic/Immunologic: Negative.   Neurological: Negative for tremors and numbness.  Hematological: Negative for adenopathy. Does not bruise/bleed easily.  Psychiatric/Behavioral: Negative for behavioral problems and sleep disturbance. The patient is not nervous/anxious.     Vital Signs: Ht 5\' 2"  (1.575 m)   Wt 130 lb (59 kg)   BMI 23.78 kg/m    Observation/Objective:  Pt  appears tired, and not well rested.  NAD noted   Assessment/Plan: 1. Hypersomnia Patient has ongoing hypersomnia despite excellent sleep hygiene.  She reports she is able to sleep for consecutive hours at night and continues to feel absolutely exhausted during the day.  She takes Provigil without much result.  2. Insomnia, unspecified type Patient's insomnia is atypical she generally sleeps 6 to 8 hours at night although she does wake up intermittently.  We will continue to monitor.  3. Seasonal allergic rhinitis due to pollen Stable continue present management.  General Counseling: Cadyn verbalizes understanding of the findings of today's phone visit and agrees with plan of treatment. I have discussed any  further diagnostic evaluation that may be needed or ordered today. We also reviewed her medications today. she has been encouraged to call the office with any questions or concerns that should arise related to todays visit.    No orders of the defined types were placed in this encounter.   No orders of the defined types were placed in this encounter.   Time spent:15 Minutes    Orson Gear AGNP-C Internal medicine

## 2019-07-14 LAB — ARGININE VASOPRESSIN HORMONE
ADH: 0.9 pg/mL (ref 0.0–4.7)
Osmolality Meas: 282 mOsmol/kg (ref 275–295)

## 2019-07-14 LAB — THYROID PANEL WITH TSH
Free Thyroxine Index: 2.1 (ref 1.2–4.9)
T3 Uptake Ratio: 28 % (ref 24–39)
T4, Total: 7.5 ug/dL (ref 4.5–12.0)
TSH: 2.92 u[IU]/mL (ref 0.450–4.500)

## 2019-07-14 LAB — ACTH: ACTH: 24.7 pg/mL (ref 7.2–63.3)

## 2019-07-15 ENCOUNTER — Telehealth: Payer: Self-pay

## 2019-07-15 ENCOUNTER — Other Ambulatory Visit: Payer: Managed Care, Other (non HMO)

## 2019-07-15 NOTE — Telephone Encounter (Signed)
-----   Message from Mar Daring, PA-C sent at 07/14/2019  4:39 PM EDT ----- All labs are normal.

## 2019-07-15 NOTE — Telephone Encounter (Signed)
Viewed by [name] on 07/14/2019 4:55 PM Written by Mar Daring, PA-C on 07/14/2019 4:39 PM All labs are normal.

## 2019-07-16 NOTE — Procedures (Signed)
Date of study June 29, 2019  Study performed multiple sleep latency test  Indication hypersomnia excessive daytime somnolence  Procedure note. Patient had a baseline sleep study done which showed insignificant obstructive sleep apnea and normal saturations.  The total apnea hypopnea index was 0.2/h.  The following morning a multiple sleep latency test was performed.  5 consecutive naps were done.  The patient had an average sleep latency of 13.1 minutes.  Sleep was noted in naps #1 #2 #3 and #4.  There were no sleep onset REM periods noted.  Nap #5 did not show any sleep onset.  Impression: This multiple sleep latency test does not show evidence of narcolepsy.  Patient does have sleep noted in the first 4 naps.  An evaluation of sleep hygiene should be done.  Also medication effect should be evaluated.  Patient also could be exhibiting insufficient sleep.  Thorough psychological evaluation is also recommended.

## 2019-07-16 NOTE — Procedures (Signed)
[facility] [address] [place], [place] [address]  Patient Name: Theresa Walton DOB: Jan 01, 1966   SLEEP STUDY INTERPRETATION  DATE OF SERVICE: June 28, 2019   SLEEP STUDY HISTORY: This patient is referred to the sleep lab for a baseline Polysomnography. Pertinent history includes a history of diagnosis of excessive daytime somnolence and snoring.  PROCEDURE: This overnight polysomnogram was performed using the Alice 5 acquisition system using the standard diagnostic protocol as outlined by the AASM. This includes 6 channels of EEG, 2 channelscannels of EOG, chin EMG, bilateral anterior tibialis EMG, nasal/oral thermister, PTAF, chest and abdominal wall movements, ECG and pulse oximetry. Apneas and Hypopneas were scored per AASM definition.  SLEEP ARCHITECHTURE: This is a baseline polysomnograph  study. The total recording time was 448.0 minutes and the patients total sleep time is noted to be 397.3 minutes. Sleep onset latency was 23.2 minutes and is prolonged.  Stage R sleep onset latency was 116.5 minutes. Sleep maintenance efficiency was 93.5% and is within normal limits.  Sleep staging expressed as a percentage of total sleep time demonstrated 5.4% N1, 65.3% N2 and 11.2% N3  sleep. Stage R represents 18.1% of total sleep time. This is decreased.  There were a total of 16 arousals  for an overall arousal index of 2.4 per hour of sleep. PLMS arousal are noted. Arousals without respiratory events are  noted. This can contribute to sleep architechture disruption.  RESPIRATORY MONITORING:   Patient exhibits no significant evidence of sleep disorderd breathing characterized by 0 central apneas, 0 obstructive apneas and 0 mixed apneas. There were only 1 obstructive hypopneas and no RERAs. Most of the apneas/hypopneas were of obstructive variety. The total apnea hypopnea index (apneas and hypopneas per hour of sleep) is 0.2 respiratory events per hour and is within normal  limits.  Respiratory monitoring demonstrated mild snoring through the night. There are a total of 53 snoring episodes representing 0.4% of sleep.   Baseline oxygen saturation during wakefulness was 97% % and during NREM sleep averaged 97% % through the night. Arterial saturation during REM sleep was 97% % through the night. There was no significant  oxygen desaturation with the respiratory events. Arterial oxygen desaturation occurred of at least 4% was noted with a low saturation of 93% %. The study was performed off oxygen.  CARDIAC MONITORING:   Average heart rate is 59 during sleep with a high of 88 beats per minute. Malignant arrhythmias were not noted.    IMPRESSIONS:  --This overnight polysomnogram demonstrates no significant obstructive sleep apnea with an overall AHI 0.2 per hour. --The overall AHI was no worse  during Stage R. --There were associated insignificant arterial oxygen desaturations noted with a low saturation of 93% --There was significant PLMS noted in this study. --There is mild snoring noted throughout the study.    RECOMMENDATIONS:  --CPAP titration study is not indicated in this case as the patient does not have significant obstructive sleep apnea.  The sleep study was followed by an M SLT. --Nasal decongestants and antihistamines may be of help for increased upper airways resistance when present. --Weight loss through dietary and lifestyle modification is recommended in the presence of obesity. --A search for and treatment of any underlying cardiopulmonary disease is      recommended in the presence of oxygen desaturations. --Alternative treatment options if the patient is not willing to use CPAP include oral   appliances as well as surgical intervention which may help in the appropriate patient. --Clinical  correlation is recommended. Please feel free to call the office for any further  questions or assistance in the care of this patient.     Allyne Gee,  MD [facility] Pulmonary Critical Care Medicine Sleep medicine

## 2019-07-19 ENCOUNTER — Ambulatory Visit: Payer: Managed Care, Other (non HMO) | Admitting: Internal Medicine

## 2019-07-21 ENCOUNTER — Encounter: Payer: Self-pay | Admitting: Adult Health

## 2019-07-27 ENCOUNTER — Ambulatory Visit (INDEPENDENT_AMBULATORY_CARE_PROVIDER_SITE_OTHER): Payer: Managed Care, Other (non HMO) | Admitting: Internal Medicine

## 2019-07-27 ENCOUNTER — Other Ambulatory Visit: Payer: Self-pay

## 2019-07-27 ENCOUNTER — Encounter: Payer: Self-pay | Admitting: Internal Medicine

## 2019-07-27 VITALS — BP 106/70 | HR 74 | Resp 16 | Ht 62.0 in | Wt 131.0 lb

## 2019-07-27 DIAGNOSIS — G4700 Insomnia, unspecified: Secondary | ICD-10-CM | POA: Diagnosis not present

## 2019-07-27 NOTE — Progress Notes (Signed)
[facility] [place], [place] [address]  Pulmonary Sleep Medicine   Office Visit Note  Patient Name: Theresa Walton DOB: 11-12-1965 MRN [id number]  Date of Service: 07/27/2019  Complaints/HPI: Patient has been having ongoing issues with her inability to sleep appropriately.  The patient came in for polysomnogram along with an M SLT.  Polysomnogram did not show any evidence of obstructive sleep apnea and in fact her index was only 0.2/h.  She had no significant desaturations very very minor snoring noted.  The following day she had an M SLT which showed no sleep onset REM periods.  And she had a sleep latency of approximately 13 minutes with the fourth and fifth nap being basically normal.  I discussed the sleep studies at length with her.  She basically has no sleep disordered breathing at this time.  She does have a history of traumatic life experiences and she has intermittently seen counseling.  I had recommended to her that at this point she see a psychiatrist which might be helping with her posttraumatic stress disorder issues which is likely contributing to her sleep disorder.  ROS  General: (-) fever, (-) chills, (-) night sweats, (-) weakness Skin: (-) rashes, (-) itching,. Eyes: (-) visual changes, (-) redness, (-) itching. Nose and Sinuses: (-) nasal stuffiness or itchiness, (-) postnasal drip, (-) nosebleeds, (-) sinus trouble. Mouth and Throat: (-) sore throat, (-) hoarseness. Neck: (-) swollen glands, (-) enlarged thyroid, (-) neck pain. Respiratory: - cough, (-) bloody sputum, - shortness of breath, - wheezing. Cardiovascular: - ankle swelling, (-) chest pain. Lymphatic: (-) lymph node enlargement. Neurologic: (-) numbness, (-) tingling. Psychiatric: (-) anxiety, (-) depression   Current Medication: Outpatient Encounter Medications as of 07/27/2019  Medication Sig  . cyclobenzaprine (FLEXERIL) 5 MG tablet TAKE 1 TO 2 TABLETS BY MOUTH AT BEDTIME   . EPINEPHrine (EPIPEN 2-PAK IJ) Inject as directed.  Marland Kitchen estradiol (VIVELLE-DOT) 0.025 MG/24HR Place 1 patch onto the skin 2 (two) times a week.  . Eszopiclone 3 MG TABS Take 1 tablet (3 mg total) by mouth at bedtime. Take immediately before bedtime  . levothyroxine (SYNTHROID) 25 MCG tablet TAKE 1 TABLET BY MOUTH BEFORE BREAKFAST  . Triprolidine-Pseudoephedrine (ANTIHISTAMINE PO) Take by mouth daily.  . valACYclovir (VALTREX) 500 MG tablet Take 1 tablet (500 mg total) by mouth 2 (two) times daily.   No facility-administered encounter medications on file as of 07/27/2019.     Surgical History: Past Surgical History:  Procedure Laterality Date  . ABDOMINAL HYSTERECTOMY    . ANTERIOR CRUCIATE LIGAMENT REPAIR Right 2012  . ARTHROSCOPIC REPAIR ACL  2013   Right knee  . AUGMENTATION MAMMAPLASTY Bilateral 2000   saline  . BLADDER REPAIR    . BREAST ENHANCEMENT SURGERY  1995  . COLONOSCOPY WITH PROPOFOL N/A 08/29/2017   Procedure: COLONOSCOPY WITH PROPOFOL;  Surgeon: Jonathon Bellows, MD;  Location: [facility] ENDOSCOPY;  Service: Gastroenterology;  Laterality: N/A;  . CYSTOCELE REPAIR  2013  . ENDOMETRIAL ABLATION  2013  . [place], 2001  . PARTIAL HYSTERECTOMY  2015  . PARTIAL HYSTERECTOMY Bilateral 2014  . RECTOPEXY  2011  . TONSILECTOMY, ADENOIDECTOMY, BILATERAL MYRINGOTOMY AND TUBES  1984  . TOOTH EXTRACTION      Medical History: Past Medical History:  Diagnosis Date  . Basal cell carcinoma (BCC) of jawline 06/26/2018  . 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[place])   . Genital herpes   . Hypothyroidism   . Seasonal allergies   .  Syncope and collapse   . Thyroid disease    hypothyroidism    Family History: Family History  Problem Relation Age of Onset  . Pulmonary fibrosis Mother   . Healthy Sister   . Healthy Brother   . Healthy Sister   . Lung cancer Paternal Grandmother   . Arrhythmia Father   . Heart disease Father   . Prostate cancer Neg Hx   . Bladder Cancer  Neg Hx   . Kidney cancer Neg Hx   . Breast cancer Neg Hx     Social History: Social History   Socioeconomic History  . Marital status: Divorced    Spouse name: Not on file  . Number of children: Not on file  . Years of education: Not on file  . Highest education level: Not on file  Occupational History  . Not on file  Social Needs  . Financial resource strain: Not on file  . Food insecurity    Worry: Not on file    Inability: Not on file  . Transportation needs    Medical: Not on file    Non-medical: Not on file  Tobacco Use  . Smoking status: Former Smoker    Types: Cigarettes    Quit date: 05/15/2015    Years since quitting: 4.2  . Smokeless tobacco: Never Used  Substance and Sexual Activity  . Alcohol use: No    Alcohol/week: 0.0 standard drinks  . Drug use: No  . Sexual activity: Never    Partners: Male    Birth control/protection: Surgical  Lifestyle  . Physical activity    Days per week: Not on file    Minutes per session: Not on file  . Stress: Not on file  Relationships  . Social Herbalist on phone: Not on file    Gets together: Not on file    Attends religious service: Not on file    Active member of club or organization: Not on file    Attends meetings of clubs or organizations: Not on file    Relationship status: Not on file  . Intimate partner violence    Fear of current or ex partner: Not on file    Emotionally abused: Not on file    Physically abused: Not on file    Forced sexual activity: Not on file  Other Topics Concern  . Not on file  Social History Narrative  . Not on file    Vital Signs: Blood pressure 106/70, pulse 74, resp. rate 16, height '5\' 2"'$  (1.575 m), weight 131 lb (59.4 kg), SpO2 98 %.  Examination: General Appearance: The patient is well-developed, well-nourished, and in no distress. Skin: Gross inspection of skin unremarkable. Head: normocephalic, no gross deformities. Eyes: no gross deformities noted. ENT:  ears appear grossly normal no exudates. Neck: Supple. No thyromegaly. No LAD. Respiratory: No rhonchi no rales. Cardiovascular: Normal S1 and S2 without murmur or rub. Extremities: No cyanosis. pulses are equal. Neurologic: Alert and oriented. No involuntary movements.  LABS: Recent Results (from the past 2160 hour(s))  ANA Direct w/Reflex if Positive     Status: None   Collection Time: 05/28/19  2:37 PM  Result Value Ref Range   Anti Nuclear Antibody (ANA) Negative Negative  Rheumatoid Factor     Status: None   Collection Time: 05/28/19  2:37 PM  Result Value Ref Range   Rhuematoid fact SerPl-aCnc <10.0 0.0 - 13.9 IU/mL  Sed Rate (ESR)     Status:  None   Collection Time: 05/28/19  2:37 PM  Result Value Ref Range   Sed Rate 2 0 - 40 mm/hr  TSH + free T4     Status: None   Collection Time: 05/28/19  2:37 PM  Result Value Ref Range   TSH 1.670 0.450 - 4.500 uIU/mL   Free T4 1.23 0.82 - 1.77 ng/dL  T3 uptake     Status: None   Collection Time: 05/28/19  2:37 PM  Result Value Ref Range   T3 Uptake Ratio 25 24 - 39 %  T3, free     Status: None   Collection Time: 05/28/19  2:37 PM  Result Value Ref Range   T3, Free 3.0 2.0 - 4.4 pg/mL  Arginine vasopressin hormone     Status: None   Collection Time: 07/06/19  3:24 PM  Result Value Ref Range   ADH 0.9 0.0 - 4.7 pg/mL    Comment: Results of this test are labeled for research purposes only by the assay's manufacturer. The performance characteristics of this assay have not been established by the manufacturer. The result should not be used for treatment or for diagnostic purposes without confirmation of the diagnosis by another medically established diagnostic product or procedure. The performance characteristics were determined by [facility].    Osmolality Meas 282 275 - 295 mOsmol/kg  Thyroid Panel With TSH     Status: None   Collection Time: 07/06/19  3:24 PM  Result Value Ref Range   TSH 2.920 0.450 - 4.500 uIU/mL   T4,  Total 7.5 4.5 - 12.0 ug/dL   T3 Uptake Ratio 28 24 - 39 %   Free Thyroxine Index 2.1 1.2 - 4.9  ACTH     Status: None   Collection Time: 07/06/19  3:24 PM  Result Value Ref Range   ACTH 24.7 7.2 - 63.3 pg/mL    Comment: ACTH reference interval for samples collected between 7 and 10 AM.    Radiology: US Breast Ltd Uni Right Inc Axilla  Result Date: 07/12/2019 CLINICAL DATA:  53 year old patient palpates a lump in the 11 o'clock region of the right breast. The patient was recalled from a screening mammogram in June of 2020 for evaluation of right breast mass, showing a benign cyst 11 o'clock position 1 cm from the nipple. EXAM: 2D DIGITAL DIAGNOSTIC RIGHT MAMMOGRAM WITH IMPLANTS, CAD AND ADJUNCT TOMO ULTRASOUND RIGHT BREAST COMPARISON:  Previous exam(s). ACR Breast Density Category c: The breast tissue is heterogeneously dense, which may obscure small masses. FINDINGS: Metallic skin marker was placed in the region of palpable concern in the 11 o'clock position of the right breast. An obscured oval mass is seen deep to the skin marker. No new mass, suspicious microcalcifications, or architectural distortion are identified in the right breast. Mammographic images were processed with CAD. On physical exam, I palpate very subtle smooth subcentimeter lump in the 11 o'clock position of the right breast approximately 1 cm from the nipple. Targeted ultrasound is performed, showing a 0.8 x 0.5 x 0.7 cm cyst with posterior acoustic enhancement. No associated vascular flow. This cyst appears slightly smaller today compared to the prior ultrasound in June 2020. Previously it measured 1.1 x 0.6 x 1.1 cm. The surrounding tissue appears normal. IMPRESSION: Interval decrease in size of the benign cyst 11 o'clock position 1 cm from the nipple in the right breast. RECOMMENDATION: Bilateral screening mammogram is recommended in June 2021. I have discussed the findings and recommendations with the patient. If  applicable, a  reminder letter will be sent to the patient regarding the next appointment. BI-RADS CATEGORY  2: Benign. Electronically Signed   By: Curlene Dolphin M.D.   On: 07/12/2019 10:41   Mm Diag Breast W/implant Tomo Uni R  Result Date: 07/12/2019 CLINICAL DATA:  53 year old patient palpates a lump in the 11 o'clock region of the right breast. The patient was recalled from a screening mammogram in June of 2020 for evaluation of right breast mass, showing a benign cyst 11 o'clock position 1 cm from the nipple. EXAM: 2D DIGITAL DIAGNOSTIC RIGHT MAMMOGRAM WITH IMPLANTS, CAD AND ADJUNCT TOMO ULTRASOUND RIGHT BREAST COMPARISON:  Previous exam(s). ACR Breast Density Category c: The breast tissue is heterogeneously dense, which may obscure small masses. FINDINGS: Metallic skin marker was placed in the region of palpable concern in the 11 o'clock position of the right breast. An obscured oval mass is seen deep to the skin marker. No new mass, suspicious microcalcifications, or architectural distortion are identified in the right breast. Mammographic images were processed with CAD. On physical exam, I palpate very subtle smooth subcentimeter lump in the 11 o'clock position of the right breast approximately 1 cm from the nipple. Targeted ultrasound is performed, showing a 0.8 x 0.5 x 0.7 cm cyst with posterior acoustic enhancement. No associated vascular flow. This cyst appears slightly smaller today compared to the prior ultrasound in June 2020. Previously it measured 1.1 x 0.6 x 1.1 cm. The surrounding tissue appears normal. IMPRESSION: Interval decrease in size of the benign cyst 11 o'clock position 1 cm from the nipple in the right breast. RECOMMENDATION: Bilateral screening mammogram is recommended in June 2021. I have discussed the findings and recommendations with the patient. If applicable, a reminder letter will be sent to the patient regarding the next appointment. BI-RADS CATEGORY  2: Benign. Electronically Signed   By:  Curlene Dolphin M.D.   On: 07/12/2019 10:41    No results found.  US Breast Ltd Uni Right Inc Axilla  Result Date: 07/12/2019 CLINICAL DATA:  53 year old patient palpates a lump in the 11 o'clock region of the right breast. The patient was recalled from a screening mammogram in June of 2020 for evaluation of right breast mass, showing a benign cyst 11 o'clock position 1 cm from the nipple. EXAM: 2D DIGITAL DIAGNOSTIC RIGHT MAMMOGRAM WITH IMPLANTS, CAD AND ADJUNCT TOMO ULTRASOUND RIGHT BREAST COMPARISON:  Previous exam(s). ACR Breast Density Category c: The breast tissue is heterogeneously dense, which may obscure small masses. FINDINGS: Metallic skin marker was placed in the region of palpable concern in the 11 o'clock position of the right breast. An obscured oval mass is seen deep to the skin marker. No new mass, suspicious microcalcifications, or architectural distortion are identified in the right breast. Mammographic images were processed with CAD. On physical exam, I palpate very subtle smooth subcentimeter lump in the 11 o'clock position of the right breast approximately 1 cm from the nipple. Targeted ultrasound is performed, showing a 0.8 x 0.5 x 0.7 cm cyst with posterior acoustic enhancement. No associated vascular flow. This cyst appears slightly smaller today compared to the prior ultrasound in June 2020. Previously it measured 1.1 x 0.6 x 1.1 cm. The surrounding tissue appears normal. IMPRESSION: Interval decrease in size of the benign cyst 11 o'clock position 1 cm from the nipple in the right breast. RECOMMENDATION: Bilateral screening mammogram is recommended in June 2021. I have discussed the findings and recommendations with the patient. If applicable, a reminder  letter will be sent to the patient regarding the next appointment. BI-RADS CATEGORY  2: Benign. Electronically Signed   By: Curlene Dolphin M.D.   On: 07/12/2019 10:41   Mm Diag Breast W/implant Tomo Uni R  Result Date:  07/12/2019 CLINICAL DATA:  53 year old patient palpates a lump in the 11 o'clock region of the right breast. The patient was recalled from a screening mammogram in June of 2020 for evaluation of right breast mass, showing a benign cyst 11 o'clock position 1 cm from the nipple. EXAM: 2D DIGITAL DIAGNOSTIC RIGHT MAMMOGRAM WITH IMPLANTS, CAD AND ADJUNCT TOMO ULTRASOUND RIGHT BREAST COMPARISON:  Previous exam(s). ACR Breast Density Category c: The breast tissue is heterogeneously dense, which may obscure small masses. FINDINGS: Metallic skin marker was placed in the region of palpable concern in the 11 o'clock position of the right breast. An obscured oval mass is seen deep to the skin marker. No new mass, suspicious microcalcifications, or architectural distortion are identified in the right breast. Mammographic images were processed with CAD. On physical exam, I palpate very subtle smooth subcentimeter lump in the 11 o'clock position of the right breast approximately 1 cm from the nipple. Targeted ultrasound is performed, showing a 0.8 x 0.5 x 0.7 cm cyst with posterior acoustic enhancement. No associated vascular flow. This cyst appears slightly smaller today compared to the prior ultrasound in June 2020. Previously it measured 1.1 x 0.6 x 1.1 cm. The surrounding tissue appears normal. IMPRESSION: Interval decrease in size of the benign cyst 11 o'clock position 1 cm from the nipple in the right breast. RECOMMENDATION: Bilateral screening mammogram is recommended in June 2021. I have discussed the findings and recommendations with the patient. If applicable, a reminder letter will be sent to the patient regarding the next appointment. BI-RADS CATEGORY  2: Benign. Electronically Signed   By: Curlene Dolphin M.D.   On: 07/12/2019 10:41      Assessment and Plan: Patient Active Problem List   Diagnosis Date Noted  . Edema 08/05/2018  . Impingement syndrome of shoulder region 08/05/2018  . Knee joint effusion  08/05/2018  . Old anterior cruciate ligament disruption 08/05/2018  . Patellar tendonitis 08/05/2018  . Basal cell carcinoma (BCC) of jawline 06/26/2018  . History of pubovaginal sling 04/01/2017  . Essential tremor 05/15/2016  . Allergic rhinitis 06/22/2015  . B12 deficiency 06/21/2015  . H/O: hysterectomy 06/21/2015  . Hypertriglyceridemia 06/21/2015  . Hypoglycemia 06/21/2015  . Menopausal and perimenopausal disorder 06/21/2015  . Vasovagal symptom 06/21/2015  . Insomnia 04/19/2015  . Female stress incontinence 03/24/2012  . Adult hypothyroidism 08/28/2009  . Avitaminosis D 08/28/2009  . Chronic infection of sinus 02/01/2009  . Adaptation reaction 09/09/2008  . Genital herpes simplex type 1 infection 07/13/2008    1. Insomnia unspecified this may be secondary to an underlying psychiatric issue I discussed this very openly with the patient and explained to her that based on her life experiences she may have some residual unmanaged psychiatric issues which need to be addressed.  I have referred her to psychiatry she is willing and able to goal and start sessions with psychiatry.  At this time I do not see any need for medicines.  Her sleep hygiene needs to be worked on which she will continue to do so.  General Counseling: I have discussed the findings of the evaluation and examination with Charliee.  I have also discussed any further diagnostic evaluation thatmay be needed or ordered today. Francella verbalizes understanding of the  findings of todays visit. We also reviewed her medications today and discussed drug interactions and side effects including but not limited excessive drowsiness and altered mental states. We also discussed that there is always a risk not just to her but also people around her. she has been encouraged to call the office with any questions or concerns that should arise related to todays visit.  Greater than 50% time spent on counseling  Time spent: 28mn  I have  personally obtained a history, examined the patient, evaluated laboratory and imaging results, formulated the assessment and plan and placed orders.    SAllyne Gee MD FSeneca Healthcare DistrictPulmonary and Critical Care Sleep medicine

## 2019-07-27 NOTE — Patient Instructions (Signed)

## 2019-08-11 ENCOUNTER — Other Ambulatory Visit: Payer: Self-pay | Admitting: Adult Health

## 2019-09-14 ENCOUNTER — Other Ambulatory Visit: Payer: Self-pay

## 2019-09-14 DIAGNOSIS — Z20822 Contact with and (suspected) exposure to covid-19: Secondary | ICD-10-CM

## 2019-09-16 LAB — NOVEL CORONAVIRUS, NAA: SARS-CoV-2, NAA: NOT DETECTED

## 2019-10-06 ENCOUNTER — Encounter: Payer: Self-pay | Admitting: Radiology

## 2019-10-15 ENCOUNTER — Ambulatory Visit
Admission: RE | Admit: 2019-10-15 | Discharge: 2019-10-15 | Disposition: A | Discharge status: Home / Self Care | Payer: Managed Care, Other (non HMO) | Source: Ambulatory Visit | Attending: Otolaryngology | Admitting: Otolaryngology

## 2019-10-15 ENCOUNTER — Other Ambulatory Visit: Payer: Self-pay

## 2019-10-15 DIAGNOSIS — R221 Localized swelling, mass and lump, neck: Secondary | ICD-10-CM | POA: Diagnosis present

## 2019-10-18 ENCOUNTER — Ambulatory Visit: Payer: Managed Care, Other (non HMO) | Attending: Internal Medicine

## 2019-10-18 DIAGNOSIS — Z20822 Contact with and (suspected) exposure to covid-19: Secondary | ICD-10-CM

## 2019-10-19 LAB — NOVEL CORONAVIRUS, NAA: SARS-CoV-2, NAA: NOT DETECTED

## 2019-10-20 ENCOUNTER — Other Ambulatory Visit: Payer: Self-pay | Admitting: Physician Assistant

## 2019-10-20 DIAGNOSIS — A6004 Herpesviral vulvovaginitis: Secondary | ICD-10-CM

## 2019-11-29 ENCOUNTER — Ambulatory Visit: Payer: Managed Care, Other (non HMO) | Attending: Internal Medicine

## 2019-11-29 DIAGNOSIS — Z20822 Contact with and (suspected) exposure to covid-19: Secondary | ICD-10-CM

## 2019-11-30 LAB — NOVEL CORONAVIRUS, NAA: SARS-CoV-2, NAA: NOT DETECTED

## 2019-12-13 ENCOUNTER — Other Ambulatory Visit: Payer: Self-pay

## 2019-12-13 ENCOUNTER — Encounter: Payer: Managed Care, Other (non HMO) | Admitting: Family Medicine

## 2019-12-14 NOTE — Progress Notes (Signed)
Patient appointment canceled. This encounter was created in error - please disregard.

## 2019-12-20 ENCOUNTER — Other Ambulatory Visit: Payer: Self-pay | Admitting: Physician Assistant

## 2019-12-22 ENCOUNTER — Ambulatory Visit: Payer: Managed Care, Other (non HMO) | Attending: Internal Medicine

## 2019-12-22 DIAGNOSIS — Z20822 Contact with and (suspected) exposure to covid-19: Secondary | ICD-10-CM

## 2019-12-23 LAB — NOVEL CORONAVIRUS, NAA: SARS-CoV-2, NAA: NOT DETECTED

## 2019-12-29 ENCOUNTER — Ambulatory Visit: Payer: Managed Care, Other (non HMO) | Attending: Internal Medicine

## 2019-12-29 DIAGNOSIS — Z20822 Contact with and (suspected) exposure to covid-19: Secondary | ICD-10-CM

## 2019-12-30 ENCOUNTER — Other Ambulatory Visit: Payer: Managed Care, Other (non HMO)

## 2019-12-30 LAB — NOVEL CORONAVIRUS, NAA: SARS-CoV-2, NAA: NOT DETECTED

## 2020-01-10 ENCOUNTER — Ambulatory Visit: Payer: Managed Care, Other (non HMO) | Attending: Internal Medicine

## 2020-01-10 DIAGNOSIS — Z20822 Contact with and (suspected) exposure to covid-19: Secondary | ICD-10-CM

## 2020-01-11 LAB — NOVEL CORONAVIRUS, NAA: SARS-CoV-2, NAA: NOT DETECTED

## 2020-01-20 ENCOUNTER — Encounter: Payer: Self-pay | Admitting: Family Medicine

## 2020-01-20 ENCOUNTER — Ambulatory Visit (INDEPENDENT_AMBULATORY_CARE_PROVIDER_SITE_OTHER): Payer: Managed Care, Other (non HMO) | Admitting: Family Medicine

## 2020-01-20 ENCOUNTER — Other Ambulatory Visit: Payer: Self-pay

## 2020-01-20 VITALS — BP 109/68 | HR 82 | Wt 135.0 lb

## 2020-01-20 DIAGNOSIS — Z01419 Encounter for gynecological examination (general) (routine) without abnormal findings: Secondary | ICD-10-CM | POA: Diagnosis not present

## 2020-01-20 DIAGNOSIS — N959 Unspecified menopausal and perimenopausal disorder: Secondary | ICD-10-CM

## 2020-01-20 NOTE — Progress Notes (Signed)
  Subjective:     Theresa Walton is a 54 y.o. female and is here for a comprehensive physical exam. The patient reports no problems. We started her on Vivelle and it is working well for her. She is s/p TVH and TVT and anterior repair in the past.    The following portions of the patient's history were reviewed and updated as appropriate: allergies, current medications, past family history, past medical history, past social history, past surgical history and problem list.  Review of Systems Pertinent items noted in HPI and remainder of comprehensive ROS otherwise negative.   Objective:    BP 109/68   Pulse 82   Wt 135 lb (61.2 kg)   BMI 24.69 kg/m  General appearance: alert, cooperative and appears stated age Neck: supple, symmetrical, trachea midline Lungs: normal effort Heart: regular rate and rhythm Abdomen: soft, non-tender; bowel sounds normal; no masses,  no organomegaly Extremities: Homans sign is negative, no sign of DVT Skin: Skin color, texture, turgor normal. No rashes or lesions    Assessment:    Healthy female exam.      Plan:   Problem List Items Addressed This Visit      Unprioritized   Menopausal and perimenopausal disorder    Continue Vivelle       Other Visit Diagnoses    Encounter for gynecological examination without abnormal finding    -  Primary   doing well     Return in 1 year (on 01/19/2021).    See After Visit Summary for Counseling Recommendations

## 2020-01-20 NOTE — Assessment & Plan Note (Signed)
Continue Vivelle

## 2020-02-28 ENCOUNTER — Ambulatory Visit: Payer: Managed Care, Other (non HMO) | Admitting: Physician Assistant

## 2020-04-05 ENCOUNTER — Other Ambulatory Visit: Payer: Self-pay

## 2020-04-05 MED ORDER — ESTRADIOL 0.025 MG/24HR TD PTTW: 1.0000 | MEDICATED_PATCH | TRANSDERMAL | 12 refills | Status: DC

## 2020-04-07 ENCOUNTER — Encounter: Payer: Managed Care, Other (non HMO) | Admitting: Physician Assistant

## 2020-04-13 ENCOUNTER — Other Ambulatory Visit: Payer: Self-pay | Admitting: Physician Assistant

## 2020-04-13 ENCOUNTER — Other Ambulatory Visit: Payer: Self-pay | Admitting: Internal Medicine

## 2020-04-18 ENCOUNTER — Encounter: Payer: Self-pay | Admitting: Physician Assistant

## 2020-04-18 ENCOUNTER — Other Ambulatory Visit: Payer: Self-pay

## 2020-04-18 MED ORDER — ESZOPICLONE 3 MG PO TABS: 3.0000 mg | ORAL_TABLET | Freq: Every day | ORAL | 0 refills | Status: DC

## 2020-04-18 NOTE — Telephone Encounter (Signed)
Pt advised we send med  

## 2020-04-21 ENCOUNTER — Encounter: Payer: Managed Care, Other (non HMO) | Admitting: Physician Assistant

## 2020-04-24 ENCOUNTER — Telehealth: Payer: Self-pay

## 2020-04-24 NOTE — Telephone Encounter (Signed)
Confirmed appointment on 04/26/2020 and screened for covid. [facility] 

## 2020-04-26 ENCOUNTER — Encounter: Payer: Self-pay | Admitting: Internal Medicine

## 2020-04-26 ENCOUNTER — Ambulatory Visit: Payer: Managed Care, Other (non HMO) | Admitting: Internal Medicine

## 2020-04-26 ENCOUNTER — Other Ambulatory Visit: Payer: Self-pay

## 2020-04-26 VITALS — BP 109/72 | HR 72 | Temp 97.5°F | Resp 16 | Ht 62.0 in | Wt 132.2 lb

## 2020-04-26 DIAGNOSIS — G4700 Insomnia, unspecified: Secondary | ICD-10-CM | POA: Diagnosis not present

## 2020-04-26 DIAGNOSIS — J301 Allergic rhinitis due to pollen: Secondary | ICD-10-CM | POA: Diagnosis not present

## 2020-04-26 MED ORDER — ESZOPICLONE 3 MG PO TABS: 3.0000 mg | ORAL_TABLET | Freq: Every day | ORAL | 1 refills | Status: DC

## 2020-04-26 NOTE — Progress Notes (Signed)
[facility] [place], [place] [address]  Pulmonary Sleep Medicine   Office Visit Note  Patient Name: Theresa Walton DOB: April 20, 1966 MRN [id number]  Date of Service: 04/26/2020  Complaints/HPI: Pt is here for pulmonary follow up. Overall she is doing well. She has found some time released melotonin that is working very well for her.  She uses Lunesta very occasionally now.   She has been having issues with her allergies, and is finishing antibiotics for her sinus infection at this time.      ROS  General: (-) fever, (-) chills, (-) night sweats, (-) weakness Skin: (-) rashes, (-) itching,. Eyes: (-) visual changes, (-) redness, (-) itching. Nose and Sinuses: (-) nasal stuffiness or itchiness, (-) postnasal drip, (-) nosebleeds, (-) sinus trouble. Mouth and Throat: (-) sore throat, (-) hoarseness. Neck: (-) swollen glands, (-) enlarged thyroid, (-) neck pain. Respiratory: - cough, (-) bloody sputum, - shortness of breath, - wheezing. Cardiovascular: - ankle swelling, (-) chest pain. Lymphatic: (-) lymph node enlargement. Neurologic: (-) numbness, (-) tingling. Psychiatric: (-) anxiety, (-) depression   Current Medication: Outpatient Encounter Medications as of 04/26/2020  Medication Sig  . cyclobenzaprine (FLEXERIL) 5 MG tablet TAKE 1 TO 2 TABLETS BY MOUTH AT BEDTIME  . estradiol (VIVELLE-DOT) 0.025 MG/24HR Place 1 patch onto the skin 2 (two) times a week.  . Eszopiclone 3 MG TABS Take 1 tablet (3 mg total) by mouth at bedtime.  Marland Kitchen levothyroxine (SYNTHROID) 25 MCG tablet TAKE 1 TABLET BY MOUTH BEFORE BREAKFAST  . Triprolidine-Pseudoephedrine (ANTIHISTAMINE PO) Take by mouth daily.  . valACYclovir (VALTREX) 500 MG tablet TAKE 1 TABLET BY MOUTH TWICE DAILY   No facility-administered encounter medications on file as of 04/26/2020.    Surgical History: Past Surgical History:  Procedure Laterality Date  . ABDOMINAL HYSTERECTOMY    . ANTERIOR CRUCIATE  LIGAMENT REPAIR Right 2012  . ARTHROSCOPIC REPAIR ACL  2013   Right knee  . AUGMENTATION MAMMAPLASTY Bilateral 2000   saline  . BLADDER REPAIR    . BREAST ENHANCEMENT SURGERY  1995  . COLONOSCOPY WITH PROPOFOL N/A 08/29/2017   Procedure: COLONOSCOPY WITH PROPOFOL;  Surgeon: Jonathon Bellows, MD;  Location: [facility] ENDOSCOPY;  Service: Gastroenterology;  Laterality: N/A;  . CYSTOCELE REPAIR  2013  . ENDOMETRIAL ABLATION  2013  . [place], 2001  . PARTIAL HYSTERECTOMY  2015  . PARTIAL HYSTERECTOMY Bilateral 2014  . RECTOPEXY  2011  . TONSILECTOMY, ADENOIDECTOMY, BILATERAL MYRINGOTOMY AND TUBES  1984  . TOOTH EXTRACTION      Medical History: Past Medical History:  Diagnosis Date  . Basal cell carcinoma (BCC) of jawline 06/26/2018  . 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[place])   . Genital herpes   . Hypothyroidism   . Seasonal allergies   . Syncope and collapse   . Thyroid disease    hypothyroidism    Family History: Family History  Problem Relation Age of Onset  . Pulmonary fibrosis Mother   . Healthy Sister   . Healthy Brother   . Healthy Sister   . Lung cancer Paternal Grandmother   . Arrhythmia Father   . Heart disease Father   . Prostate cancer Neg Hx   . Bladder Cancer Neg Hx   . Kidney cancer Neg Hx   . Breast cancer Neg Hx     Social History: Social History   Socioeconomic History  . Marital status: Divorced    Spouse name: Not on file  .  Number of children: Not on file  . Years of education: Not on file  . Highest education level: Not on file  Occupational History  . Not on file  Tobacco Use  . Smoking status: Former Smoker    Types: Cigarettes    Quit date: 05/15/2015    Years since quitting: 4.9  . Smokeless tobacco: Never Used  Vaping Use  . Vaping Use: Never used  Substance and Sexual Activity  . Alcohol use: No    Alcohol/week: 0.0 standard drinks  . Drug use: No  . Sexual activity: Never    Partners: Male    Birth  control/protection: Surgical  Other Topics Concern  . Not on file  Social History Narrative  . Not on file   Social Determinants of Health   Financial Resource Strain:   . Difficulty of Paying Living Expenses:   Food Insecurity:   . Worried About Charity fundraiser in the Last Year:   . Arboriculturist in the Last Year:   Transportation Needs:   . Film/video editor (Medical):   Marland Kitchen Lack of Transportation (Non-Medical):   Physical Activity:   . Days of Exercise per Week:   . Minutes of Exercise per Session:   Stress:   . Feeling of Stress :   Social Connections:   . Frequency of Communication with Friends and Family:   . Frequency of Social Gatherings with Friends and Family:   . Attends Religious Services:   . Active Member of Clubs or Organizations:   . Attends Archivist Meetings:   Marland Kitchen Marital Status:   Intimate Partner Violence:   . Fear of Current or Ex-Partner:   . Emotionally Abused:   Marland Kitchen Physically Abused:   . Sexually Abused:     Vital Signs: Blood pressure 109/72, pulse 72, temperature (!) 97.5 F (36.4 C), resp. rate 16, height 5\' 2"  (1.575 m), weight 132 lb 3.2 oz (60 kg), SpO2 100 %.  Examination: General Appearance: The patient is well-developed, well-nourished, and in no distress. Skin: Gross inspection of skin unremarkable. Head: normocephalic, no gross deformities. Eyes: no gross deformities noted. ENT: ears appear grossly normal no exudates. Neck: Supple. No thyromegaly. No LAD. Respiratory: clear bilaterally. Cardiovascular: Normal S1 and S2 without murmur or rub. Extremities: No cyanosis. pulses are equal. Neurologic: Alert and oriented. No involuntary movements.  LABS: No results found for this or any previous visit (from the past 2160 hour(s)).  Radiology: US SOFT TISSUE HEAD & NECK (NON-THYROID)  Result Date: 10/15/2019 CLINICAL DATA:  Follow-up of palpable neck mass. EXAM: ULTRASOUND OF HEAD/NECK SOFT TISSUES TECHNIQUE:  Ultrasound examination of the head and neck soft tissues was performed in the area of clinical concern. COMPARISON:  CT scan of the neck dated 04/13/2019 FINDINGS: The ultrasound examination demonstrates a 7 x 6 x 5 mm lymph node at the thoracic inlet just posterior to the medial aspect of the left clavicle. This correlates with the small lymph node seen on the prior CT scan. However, the lymph node is smaller than on the prior study. There is a subtle fatty hilum consistent with a benign reactive node. There are no other soft tissue masses in the left side of the neck. The palpable fullness in the left side of the neck represents the normal carotid bulb. IMPRESSION: 1. The small lymph node at the thoracic inlet noted on the prior study is smaller than the prior study and is felt to represent a benign lymph  node. No further follow-up of this is recommended. 2. No other adenopathy or masses in the left side of the neck. I personally discussed the findings with the patient and showed her the images. Electronically Signed   By: Lorriane Shire M.D.   On: 10/15/2019 13:53    No results found.  No results found.    Assessment and Plan: Patient Active Problem List   Diagnosis Date Noted  . Edema 08/05/2018  . Impingement syndrome of shoulder region 08/05/2018  . Knee joint effusion 08/05/2018  . Old anterior cruciate ligament disruption 08/05/2018  . Patellar tendonitis 08/05/2018  . Basal cell carcinoma (BCC) of jawline 06/26/2018  . History of pubovaginal sling 04/01/2017  . Essential tremor 05/15/2016  . Allergic rhinitis 06/22/2015  . B12 deficiency 06/21/2015  . H/O: hysterectomy 06/21/2015  . Hypertriglyceridemia 06/21/2015  . Hypoglycemia 06/21/2015  . Menopausal and perimenopausal disorder 06/21/2015  . Vasovagal symptom 06/21/2015  . Insomnia 04/19/2015  . Female stress incontinence 03/24/2012  . Adult hypothyroidism 08/28/2009  . Avitaminosis D 08/28/2009  . Chronic infection of  sinus 02/01/2009  . Adaptation reaction 09/09/2008  . Genital herpes simplex type 1 infection 07/13/2008    1. Insomnia, unspecified type Continue to use sparingly.  Good control at this time.  Continue to follow.  - Eszopiclone 3 MG TABS; Take 1 tablet (3 mg total) by mouth at bedtime.  Dispense: 30 tablet; Refill: 1  2. Seasonal allergic rhinitis due to pollen Continue to use medications as discussed.   General Counseling: I have discussed the findings of the evaluation and examination with Mixtli.  I have also discussed any further diagnostic evaluation thatmay be needed or ordered today. Kashish verbalizes understanding of the findings of todays visit. We also reviewed her medications today and discussed drug interactions and side effects including but not limited excessive drowsiness and altered mental states. We also discussed that there is always a risk not just to her but also people around her. she has been encouraged to call the office with any questions or concerns that should arise related to todays visit.  No orders of the defined types were placed in this encounter.    Time spent: 25 This patient was seen by Orson Gear AGNP-C in Collaboration with Dr. Devona Konig as a part of collaborative care agreement.   I have personally obtained a history, examined the patient, evaluated laboratory and imaging results, formulated the assessment and plan and placed orders.    Allyne Gee, MD [facility] Pulmonary and Critical Care Sleep medicine

## 2020-04-27 NOTE — Progress Notes (Signed)
Complete physical exam   Patient: Theresa Walton   DOB: 1966-09-08   54 y.o. Female  MRN: [id number] Visit Date: 04/28/2020  Today's healthcare provider: Mar Daring, PA-C   Chief Complaint  Patient presents with  . Annual Exam   Subjective    Theresa Walton is a 54 y.o. female who presents today for a complete physical exam.  She reports consuming a healthy  diet. Home exercise routine includes walking 2 1/2 hrs per week. She generally feels well. She reports sleeping fairly well. She does not have additional problems to discuss today.  HPI  She need biometric form fill out. Waist Circumference:29 inches  Past Medical History:  Diagnosis Date  . Basal cell carcinoma (BCC) of jawline 06/26/2018  . 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[place])   . Genital herpes   . Hypothyroidism   . Seasonal allergies   . Syncope and collapse   . Thyroid disease    hypothyroidism   Past Surgical History:  Procedure Laterality Date  . ABDOMINAL HYSTERECTOMY    . ANTERIOR CRUCIATE LIGAMENT REPAIR Right 2012  . ARTHROSCOPIC REPAIR ACL  2013   Right knee  . AUGMENTATION MAMMAPLASTY Bilateral 2000   saline  . BLADDER REPAIR    . BREAST ENHANCEMENT SURGERY  1995  . COLONOSCOPY WITH PROPOFOL N/A 08/29/2017   Procedure: COLONOSCOPY WITH PROPOFOL;  Surgeon: Jonathon Bellows, MD;  Location: [facility] ENDOSCOPY;  Service: Gastroenterology;  Laterality: N/A;  . CYSTOCELE REPAIR  2013  . ENDOMETRIAL ABLATION  2013  . [place], 2001  . PARTIAL HYSTERECTOMY  2015  . PARTIAL HYSTERECTOMY Bilateral 2014  . RECTOPEXY  2011  . TONSILECTOMY, ADENOIDECTOMY, BILATERAL MYRINGOTOMY AND TUBES  1984  . TOOTH EXTRACTION     Social History   Socioeconomic History  . Marital status: Divorced    Spouse name: Not on file  . Number of children: Not on file  . Years of education: Not on file  . Highest education level: Not on file  Occupational History  . Not on file  Tobacco Use  .  Smoking status: Former Smoker    Types: Cigarettes    Quit date: 05/15/2015    Years since quitting: 4.9  . Smokeless tobacco: Never Used  Vaping Use  . Vaping Use: Never used  Substance and Sexual Activity  . Alcohol use: No    Alcohol/week: 0.0 standard drinks  . Drug use: No  . Sexual activity: Never    Partners: Male    Birth control/protection: Surgical  Other Topics Concern  . Not on file  Social History Narrative  . Not on file   Social Determinants of Health   Financial Resource Strain:   . Difficulty of Paying Living Expenses:   Food Insecurity:   . Worried About Charity fundraiser in the Last Year:   . Arboriculturist in the Last Year:   Transportation Needs:   . Film/video editor (Medical):   Marland Kitchen Lack of Transportation (Non-Medical):   Physical Activity:   . Days of Exercise per Week:   . Minutes of Exercise per Session:   Stress:   . Feeling of Stress :   Social Connections:   . Frequency of Communication with Friends and Family:   . Frequency of Social Gatherings with Friends and Family:   . Attends Religious Services:   . Active Member of Clubs or Organizations:   . Attends Archivist Meetings:   .  Marital Status:   Intimate Partner Violence:   . Fear of Current or Ex-Partner:   . Emotionally Abused:   Marland Kitchen Physically Abused:   . Sexually Abused:    Family Status  Relation Name Status  . Mother  Deceased at age 11/14/2007  . Sister 1 Alive  . Brother 1 Alive  . Sister 2 Alive  . PGM  (Not Specified)  . Father  Alive  . Neg Hx  (Not Specified)   Family History  Problem Relation Age of Onset  . Pulmonary fibrosis Mother   . Healthy Sister   . Healthy Brother   . Healthy Sister   . Lung cancer Paternal Grandmother   . Arrhythmia Father   . Heart disease Father   . Prostate cancer Neg Hx   . Bladder Cancer Neg Hx   . Kidney cancer Neg Hx   . Breast cancer Neg Hx    Allergies  Allergen Reactions  . Cefaclor Anaphylaxis  .  Cephalosporins Anaphylaxis  . Lubiprostone     Other reaction(s): Angioedema Tongue Swelling x 2 times.  Other reaction(s): Angioedema Tongue Swelling x 2 times.   . Celecoxib   . Lac Bovis     Other reaction(s): Other (See Comments) GI Upset to milk protein  . Apple Nausea And Vomiting  . Pseudoephedrine Palpitations    Tachycardia    Patient Care Team: Mar Daring, PA-C as PCP - General (Family Medicine)   Medications: Outpatient Medications Prior to Visit  Medication Sig  . azelastine (ASTELIN) 0.1 % nasal spray Place into the nose.  . Azelastine-Fluticasone 137-50 MCG/ACT SUSP INSTILL 1 SPRAY INTO EACH NOSTRIL TWICE A DAY  . cetirizine (ZYRTEC) 5 MG tablet Take 5 mg by mouth daily.  . cyclobenzaprine (FLEXERIL) 5 MG tablet TAKE 1 TO 2 TABLETS BY MOUTH AT BEDTIME  . estradiol (VIVELLE-DOT) 0.025 MG/24HR Place 1 patch onto the skin 2 (two) times a week.  . Eszopiclone 3 MG TABS Take 1 tablet (3 mg total) by mouth at bedtime.  Marland Kitchen levocetirizine (XYZAL) 5 MG tablet 1 (one) time each day in the evening.  Marland Kitchen levothyroxine (SYNTHROID) 25 MCG tablet TAKE 1 TABLET BY MOUTH BEFORE BREAKFAST  . metroNIDAZOLE (METROCREAM) 0.75 % cream metronidazole 0.75 % topical cream  . montelukast (SINGULAIR) 10 MG tablet Take 10 mg by mouth daily.  . valACYclovir (VALTREX) 500 MG tablet TAKE 1 TABLET BY MOUTH TWICE DAILY  . Triprolidine-Pseudoephedrine (ANTIHISTAMINE PO) Take by mouth daily.   No facility-administered medications prior to visit.    Review of Systems  Constitutional: Negative.   HENT: Negative.   Eyes: Negative.   Respiratory: Negative.   Cardiovascular: Negative.   Gastrointestinal: Negative.   Endocrine: Negative.   Genitourinary: Negative.   Musculoskeletal: Negative.   Skin: Negative.   Allergic/Immunologic: Negative.   Neurological: Negative.   Hematological: Negative.   Psychiatric/Behavioral: Negative.       Objective    BP 107/74 (BP Location: Left  Arm, Patient Position: Sitting, Cuff Size: Normal)   Pulse 76   Temp (!) 97 F (36.1 C) (Temporal)   Resp 16   Ht 5\' 2"  (1.575 m)   Wt 132 lb 3.2 oz (60 kg)   BMI 24.18 kg/m    Physical Exam Vitals reviewed.  Constitutional:      General: She is not in acute distress.    Appearance: Normal appearance. She is well-developed and normal weight. She is not ill-appearing or diaphoretic.  HENT:  Head: Normocephalic and atraumatic.     Right Ear: Tympanic membrane, ear canal and external ear normal.     Left Ear: Tympanic membrane, ear canal and external ear normal.     Nose: Nose normal.     Mouth/Throat:     Mouth: Mucous membranes are moist.     Pharynx: Oropharynx is clear. No oropharyngeal exudate or posterior oropharyngeal erythema.  Eyes:     General: No scleral icterus.       Right eye: No discharge.        Left eye: No discharge.     Extraocular Movements: Extraocular movements intact.     Conjunctiva/sclera: Conjunctivae normal.     Pupils: Pupils are equal, round, and reactive to light.  Neck:     Thyroid: No thyromegaly.     Vascular: No carotid bruit or JVD.     Trachea: No tracheal deviation.  Cardiovascular:     Rate and Rhythm: Normal rate and regular rhythm.     Pulses: Normal pulses.     Heart sounds: Normal heart sounds. No murmur heard.  No friction rub. No gallop.   Pulmonary:     Effort: Pulmonary effort is normal. No respiratory distress.     Breath sounds: Normal breath sounds. No wheezing or rales.  Chest:     Chest wall: No tenderness.  Abdominal:     General: Abdomen is flat. Bowel sounds are normal. There is no distension.     Palpations: Abdomen is soft. There is no mass.     Tenderness: There is no abdominal tenderness. There is no guarding or rebound.  Musculoskeletal:        General: No tenderness. Normal range of motion.     Cervical back: Normal range of motion and neck supple.     Right lower leg: No edema.     Left lower leg: No  edema.  Lymphadenopathy:     Cervical: No cervical adenopathy.  Skin:    General: Skin is warm and dry.     Capillary Refill: Capillary refill takes less than 2 seconds.     Findings: No rash.  Neurological:     General: No focal deficit present.     Mental Status: She is alert and oriented to person, place, and time. Mental status is at baseline.  Psychiatric:        Mood and Affect: Mood normal.        Behavior: Behavior normal.        Thought Content: Thought content normal.        Judgment: Judgment normal.      Last depression screening scores PHQ 2/9 Scores 04/28/2020 04/21/2019 12/10/2018  PHQ - 2 Score 1 0 0  PHQ- 9 Score 3 2 -   Last fall risk screening Fall Risk  04/28/2020  Falls in the past year? 0  Number falls in past yr: 0  Injury with Fall? 0  Risk for fall due to : No Fall Risks  Follow up Falls evaluation completed   Last Audit-C alcohol use screening Alcohol Use Disorder Test (AUDIT) 04/28/2020  1. How often do you have a drink containing alcohol? 0  2. How many drinks containing alcohol do you have on a typical day when you are drinking? 0  3. How often do you have six or more drinks on one occasion? 0  AUDIT-C Score 0  Alcohol Brief Interventions/Follow-up AUDIT Score <7 follow-up not indicated   A score of 3 or  more in women, and 4 or more in men indicates increased risk for alcohol abuse, EXCEPT if all of the points are from question 1   No results found for any visits on 04/28/20.  Assessment & Plan    Routine Health Maintenance and Physical Exam  Exercise Activities and Dietary recommendations Goals   None     Immunization History  Administered Date(s) Administered  . Influenza Inj Mdck Quad Pf 07/29/2019  . Influenza,inj,Quad PF,6+ Mos 08/02/2014, 08/12/2016, 08/19/2018  . Influenza,inj,quad, With Preservative 08/24/2016  . Influenza-Unspecified 08/29/2015, 08/20/2017  . Pneumococcal Polysaccharide-23 07/30/2019  . Tdap 08/12/2016     Health Maintenance  Topic Date Due  . COVID-19 Vaccine (1) Never done  . INFLUENZA VACCINE  05/28/2020  . MAMMOGRAM  07/11/2020  . TETANUS/TDAP  08/12/2026  . COLONOSCOPY  08/30/2027  . Hepatitis C Screening  Completed  . HIV Screening  Completed    Discussed health benefits of physical activity, and encouraged her to engage in regular exercise appropriate for her age and condition.  1. Routine general medical examination at a health care facility Normal physical exam today. Will check labs as below and f/u pending lab results. If labs are stable and WNL she will not need to have these rechecked for one year at her next annual physical exam. She is to call the office in the meantime if she has any acute issue, questions or concerns. - Comprehensive metabolic panel  2. Encounter for breast cancer screening using non-mammogram modality Breast exam today was normal. There is no family history of breast cancer. She does perform regular self breast exams. Mammogram was ordered as below. Information for [facility] was given to patient so she may schedule her mammogram at her convenience. - MM 3D SCREEN BREAST W/IMPLANT BILATERAL; Future  3. Encounter for biometric screening Will check labs as below and f/u pending results. - Lipid panel - CBC with Differential/Platelet - SARS-CoV-2 Semi-Quantitative Total Antibody, Spike  4. Adult hypothyroidism Stable. Diagnosis pulled for medication refill. Continue current medical treatment plan. Will check labs as below and f/u pending results. - TSH - CBC with Differential/Platelet - levothyroxine (SYNTHROID) 50 MCG tablet; Take 1 tablet (50 mcg total) by mouth daily.  Dispense: 90 tablet; Refill: 3  5. Hypoglycemia Stable. Will check labs as below and f/u pending results. - CBC with Differential/Platelet - Hemoglobin A1c  6. Hypertriglyceridemia Stable. Diet controlled. Will check labs as below and f/u pending results. -  Lipid panel - CBC with Differential/Platelet  7. B12 deficiency H/O this. Will check labs as below and f/u pending results. - CBC with Differential/Platelet - B12 and Folate Panel  8. Avitaminosis D H/O this. Will check labs as below and f/u pending results. - CBC with Differential/Platelet - Vitamin D (25 hydroxy)  9. Need for shingles vaccine 1st shingles vaccine given without issue. Return in 2 months for next vaccine.   No follow-ups on file.     Reynolds Bowl, PA-C, have reviewed all documentation for this visit. The documentation on 05/07/20 for the exam, diagnosis, procedures, and orders are all accurate and complete.   Rubye Beach  [facility] [phone] (phone) [phone] (fax)  [place]

## 2020-04-28 ENCOUNTER — Ambulatory Visit (INDEPENDENT_AMBULATORY_CARE_PROVIDER_SITE_OTHER): Payer: Managed Care, Other (non HMO) | Admitting: Physician Assistant

## 2020-04-28 ENCOUNTER — Encounter: Payer: Self-pay | Admitting: Physician Assistant

## 2020-04-28 ENCOUNTER — Other Ambulatory Visit: Payer: Self-pay

## 2020-04-28 VITALS — BP 107/74 | HR 76 | Temp 97.0°F | Resp 16 | Ht 62.0 in | Wt 132.2 lb

## 2020-04-28 DIAGNOSIS — L719 Rosacea, unspecified: Secondary | ICD-10-CM

## 2020-04-28 DIAGNOSIS — Z9109 Other allergy status, other than to drugs and biological substances: Secondary | ICD-10-CM

## 2020-04-28 DIAGNOSIS — Z0000 Encounter for general adult medical examination without abnormal findings: Secondary | ICD-10-CM | POA: Diagnosis not present

## 2020-04-28 DIAGNOSIS — Z008 Encounter for other general examination: Secondary | ICD-10-CM | POA: Diagnosis not present

## 2020-04-28 DIAGNOSIS — E538 Deficiency of other specified B group vitamins: Secondary | ICD-10-CM

## 2020-04-28 DIAGNOSIS — Z23 Encounter for immunization: Secondary | ICD-10-CM

## 2020-04-28 DIAGNOSIS — E039 Hypothyroidism, unspecified: Secondary | ICD-10-CM

## 2020-04-28 DIAGNOSIS — Z1239 Encounter for other screening for malignant neoplasm of breast: Secondary | ICD-10-CM | POA: Diagnosis not present

## 2020-04-28 DIAGNOSIS — E162 Hypoglycemia, unspecified: Secondary | ICD-10-CM

## 2020-04-28 DIAGNOSIS — E781 Pure hyperglyceridemia: Secondary | ICD-10-CM

## 2020-04-28 DIAGNOSIS — E559 Vitamin D deficiency, unspecified: Secondary | ICD-10-CM

## 2020-04-28 MED ORDER — LEVOTHYROXINE SODIUM 50 MCG PO TABS: 50.0000 ug | ORAL_TABLET | Freq: Every day | ORAL | 3 refills | Status: DC

## 2020-04-28 NOTE — Patient Instructions (Signed)
Health Maintenance, Female Adopting a healthy lifestyle and getting preventive care are important in promoting health and wellness. Ask your health care provider about:  The right schedule for you to have regular tests and exams.  Things you can do on your own to prevent diseases and keep yourself healthy. What should I know about diet, weight, and exercise? Eat a healthy diet   Eat a diet that includes plenty of vegetables, fruits, low-fat dairy products, and lean protein.  Do not eat a lot of foods that are high in solid fats, added sugars, or sodium. Maintain a healthy weight Body mass index (BMI) is used to identify weight problems. It estimates body fat based on height and weight. Your health care provider can help determine your BMI and help you achieve or maintain a healthy weight. Get regular exercise Get regular exercise. This is one of the most important things you can do for your health. Most adults should:  Exercise for at least 150 minutes each week. The exercise should increase your heart rate and make you sweat (moderate-intensity exercise).  Do strengthening exercises at least twice a week. This is in addition to the moderate-intensity exercise.  Spend less time sitting. Even light physical activity can be beneficial. Watch cholesterol and blood lipids Have your blood tested for lipids and cholesterol at 56 years of age, then have this test every 5 years. Have your cholesterol levels checked more often if:  Your lipid or cholesterol levels are high.  You are older than 56 years of age.  You are at high risk for heart disease. What should I know about cancer screening? Depending on your health history and family history, you may need to have cancer screening at various ages. This may include screening for:  Breast cancer.  Cervical cancer.  Colorectal cancer.  Skin cancer.  Lung cancer. What should I know about heart disease, diabetes, and high blood  pressure? Blood pressure and heart disease  High blood pressure causes heart disease and increases the risk of stroke. This is more likely to develop in people who have high blood pressure readings, are of African descent, or are overweight.  Have your blood pressure checked: ? Every 3-5 years if you are 18-39 years of age. ? Every year if you are 40 years old or older. Diabetes Have regular diabetes screenings. This checks your fasting blood sugar level. Have the screening done:  Once every three years after age 40 if you are at a normal weight and have a low risk for diabetes.  More often and at a younger age if you are overweight or have a high risk for diabetes. What should I know about preventing infection? Hepatitis B If you have a higher risk for hepatitis B, you should be screened for this virus. Talk with your health care provider to find out if you are at risk for hepatitis B infection. Hepatitis C Testing is recommended for:  Everyone born from 1945 through 1965.  Anyone with known risk factors for hepatitis C. Sexually transmitted infections (STIs)  Get screened for STIs, including gonorrhea and chlamydia, if: ? You are sexually active and are younger than 56 years of age. ? You are older than 56 years of age and your health care provider tells you that you are at risk for this type of infection. ? Your sexual activity has changed since you were last screened, and you are at increased risk for chlamydia or gonorrhea. Ask your health care provider if   you are at risk.  Ask your health care provider about whether you are at high risk for HIV. Your health care provider may recommend a prescription medicine to help prevent HIV infection. If you choose to take medicine to prevent HIV, you should first get tested for HIV. You should then be tested every 3 months for as long as you are taking the medicine. Pregnancy  If you are about to stop having your period (premenopausal) and  you may become pregnant, seek counseling before you get pregnant.  Take 400 to 800 micrograms (mcg) of folic acid every day if you become pregnant.  Ask for birth control (contraception) if you want to prevent pregnancy. Osteoporosis and menopause Osteoporosis is a disease in which the bones lose minerals and strength with aging. This can result in bone fractures. If you are 65 years old or older, or if you are at risk for osteoporosis and fractures, ask your health care provider if you should:  Be screened for bone loss.  Take a calcium or vitamin D supplement to lower your risk of fractures.  Be given hormone replacement therapy (HRT) to treat symptoms of menopause. Follow these instructions at home: Lifestyle  Do not use any products that contain nicotine or tobacco, such as cigarettes, e-cigarettes, and chewing tobacco. If you need help quitting, ask your health care provider.  Do not use street drugs.  Do not share needles.  Ask your health care provider for help if you need support or information about quitting drugs. Alcohol use  Do not drink alcohol if: ? Your health care provider tells you not to drink. ? You are pregnant, may be pregnant, or are planning to become pregnant.  If you drink alcohol: ? Limit how much you use to 0-1 drink a day. ? Limit intake if you are breastfeeding.  Be aware of how much alcohol is in your drink. In the [place] one drink equals one 12 oz bottle of beer (355 mL), one 5 oz glass of wine (148 mL), or one 1 oz glass of hard liquor (44 mL). General instructions  Schedule regular health, dental, and eye exams.  Stay current with your vaccines.  Tell your health care provider if: ? You often feel depressed. ? You have ever been abused or do not feel safe at home. Summary  Adopting a healthy lifestyle and getting preventive care are important in promoting health and wellness.  Follow your health care provider's instructions about healthy  diet, exercising, and getting tested or screened for diseases.  Follow your health care provider's instructions on monitoring your cholesterol and blood pressure. This information is not intended to replace advice given to you by your health care provider. Make sure you discuss any questions you have with your health care provider. Document Revised: 10/07/2018 Document Reviewed: 10/07/2018 Elsevier Patient Education  2020 Elsevier Inc.  

## 2020-04-29 LAB — COMPREHENSIVE METABOLIC PANEL
ALT: 20 IU/L (ref 0–32)
AST: 16 IU/L (ref 0–40)
Albumin/Globulin Ratio: 2.3 — ABNORMAL HIGH (ref 1.2–2.2)
Albumin: 4.6 g/dL (ref 3.8–4.9)
Alkaline Phosphatase: 70 IU/L (ref 48–121)
BUN/Creatinine Ratio: 13 (ref 9–23)
BUN: 9 mg/dL (ref 6–24)
Bilirubin Total: 0.3 mg/dL (ref 0.0–1.2)
CO2: 24 mmol/L (ref 20–29)
Calcium: 9.6 mg/dL (ref 8.7–10.2)
Chloride: 100 mmol/L (ref 96–106)
Creatinine, Ser: 0.68 mg/dL (ref 0.57–1.00)
GFR calc Af Amer: 114 mL/min/{1.73_m2} (ref >59)
GFR calc non Af Amer: 99 mL/min/{1.73_m2} (ref >59)
Globulin, Total: 2 g/dL (ref 1.5–4.5)
Glucose: 84 mg/dL (ref 65–99)
Potassium: 4.1 mmol/L (ref 3.5–5.2)
Sodium: 141 mmol/L (ref 134–144)
Total Protein: 6.6 g/dL (ref 6.0–8.5)

## 2020-04-29 LAB — CBC WITH DIFFERENTIAL/PLATELET
Basophils Absolute: 0 10*3/uL (ref 0.0–0.2)
Basos: 1 %
EOS (ABSOLUTE): 0 10*3/uL (ref 0.0–0.4)
Eos: 1 %
Hematocrit: 42.3 % (ref 34.0–46.6)
Hemoglobin: 14.1 g/dL (ref 11.1–15.9)
Immature Grans (Abs): 0 10*3/uL (ref 0.0–0.1)
Immature Granulocytes: 0 %
Lymphocytes Absolute: 1.4 10*3/uL (ref 0.7–3.1)
Lymphs: 28 %
MCH: 30.9 pg (ref 26.6–33.0)
MCHC: 33.3 g/dL (ref 31.5–35.7)
MCV: 93 fL (ref 79–97)
Monocytes Absolute: 0.5 10*3/uL (ref 0.1–0.9)
Monocytes: 10 %
Neutrophils Absolute: 3 10*3/uL (ref 1.4–7.0)
Neutrophils: 60 %
Platelets: 281 10*3/uL (ref 150–450)
RBC: 4.56 x10E6/uL (ref 3.77–5.28)
RDW: 12.8 % (ref 11.7–15.4)
WBC: 4.9 10*3/uL (ref 3.4–10.8)

## 2020-04-29 LAB — B12 AND FOLATE PANEL
Folate: 5.5 ng/mL (ref >3.0)
Vitamin B-12: 261 pg/mL (ref 232–1245)

## 2020-04-29 LAB — HEMOGLOBIN A1C
Est. average glucose Bld gHb Est-mCnc: 103 mg/dL
Hgb A1c MFr Bld: 5.2 % (ref 4.8–5.6)

## 2020-04-29 LAB — VITAMIN D 25 HYDROXY (VIT D DEFICIENCY, FRACTURES): Vit D, 25-Hydroxy: 31.6 ng/mL (ref 30.0–100.0)

## 2020-04-29 LAB — TSH: TSH: 2.38 u[IU]/mL (ref 0.450–4.500)

## 2020-04-29 LAB — LIPID PANEL
Chol/HDL Ratio: 3.2 ratio (ref 0.0–4.4)
Cholesterol, Total: 206 mg/dL — ABNORMAL HIGH (ref 100–199)
HDL: 65 mg/dL (ref >39)
LDL Chol Calc (NIH): 122 mg/dL — ABNORMAL HIGH (ref 0–99)
Triglycerides: 107 mg/dL (ref 0–149)
VLDL Cholesterol Cal: 19 mg/dL (ref 5–40)

## 2020-04-29 LAB — SARS-COV-2 SEMI-QUANTITATIVE TOTAL ANTIBODY, SPIKE: SARS-CoV-2 Semi-Quant Total Ab: 1053 U/mL (ref <0.8)

## 2020-05-02 ENCOUNTER — Telehealth: Payer: Self-pay

## 2020-05-02 NOTE — Telephone Encounter (Signed)
Patient advised as directed below. 

## 2020-05-02 NOTE — Telephone Encounter (Signed)
-----   Message from Mar Daring, [place] sent at 05/02/2020  1:01 PM EDT ----- Kidney and liver function are normal. Sodium, potassium and calcium are normal. Cholesterol is borderline elevated but improved from last year. Thyroid is normal. Blood count is normal. A1c/sugar is normal. B12 is normal range but low normal. Would benefit from OTC B12 supplementation. Folate is normal. Vit D is normal,  but decreased some from last year. Taking OTC Vit D 1000-2000 IU daily will help. Covid antibodies are present. Form completed and will be faxed today.

## 2020-05-28 ENCOUNTER — Other Ambulatory Visit: Payer: Self-pay | Admitting: Physician Assistant

## 2020-05-28 DIAGNOSIS — M62838 Other muscle spasm: Secondary | ICD-10-CM

## 2020-05-28 NOTE — Telephone Encounter (Signed)
Requested medication (s) are due for refill today: yes  Requested medication (s) are on the active medication list: yes  Last refill:  03/29/19  Future visit scheduled: no  Notes to clinic:  med not delegated to NT to refill   Requested Prescriptions  Pending Prescriptions Disp Refills   cyclobenzaprine (FLEXERIL) 5 MG tablet [Pharmacy Med Name: CYCLOBENZAPRINE 5MG  TABLETS] 30 tablet 5    Sig: TAKE 1 TO 2 TABLETS BY MOUTH AT BEDTIME      Not Delegated - Analgesics:  Muscle Relaxants Failed - 05/28/2020  2:17 PM      Failed - This refill cannot be delegated      Passed - Valid encounter within last 6 months    Recent Outpatient Visits           1 month ago Routine general medical examination at a health care facility   [place], [facility], [place]   10 months ago Breast mass, right   [place], Clearn[facility] Sorrel, [place]   1 year ago Annual physical exam   [place], Cle[facility] Sorrel, [place]   1 year ago Otalgia, left   [facility], [place], [place]   1 year ago Non-recurrent acute suppurative otitis media of right ear without spontaneous rupture of tympanic membrane   [facility] [place], [place], [place]

## 2020-06-12 MED ORDER — ESTRADIOL 0.0375 MG/24HR TD PTTW: 1.0000 | MEDICATED_PATCH | TRANSDERMAL | 12 refills | Status: DC

## 2020-06-23 ENCOUNTER — Other Ambulatory Visit: Payer: Self-pay

## 2020-06-23 ENCOUNTER — Ambulatory Visit
Admission: RE | Admit: 2020-06-23 | Discharge: 2020-06-23 | Disposition: A | Discharge status: Home / Self Care | Payer: Managed Care, Other (non HMO) | Source: Ambulatory Visit | Attending: Physician Assistant | Admitting: Physician Assistant

## 2020-06-23 DIAGNOSIS — Z1239 Encounter for other screening for malignant neoplasm of breast: Secondary | ICD-10-CM

## 2020-06-23 DIAGNOSIS — Z1231 Encounter for screening mammogram for malignant neoplasm of breast: Secondary | ICD-10-CM | POA: Diagnosis not present

## 2020-06-26 ENCOUNTER — Ambulatory Visit: Payer: Managed Care, Other (non HMO) | Admitting: Physician Assistant

## 2020-06-26 ENCOUNTER — Other Ambulatory Visit: Payer: Self-pay

## 2020-06-26 ENCOUNTER — Encounter: Payer: Self-pay | Admitting: Physician Assistant

## 2020-06-26 VITALS — Temp 98.2°F

## 2020-06-26 DIAGNOSIS — Z23 Encounter for immunization: Secondary | ICD-10-CM

## 2020-07-05 NOTE — Progress Notes (Signed)
Patient here for shingles and flu vaccines only. Vaccines given to patient without complications. Patient sat for 15 minutes after administration and was tolerated well without adverse effects. I was available if needed. No E/M

## 2020-07-06 ENCOUNTER — Encounter: Payer: Self-pay | Admitting: Physician Assistant

## 2020-07-06 DIAGNOSIS — F418 Other specified anxiety disorders: Secondary | ICD-10-CM

## 2020-07-07 MED ORDER — SERTRALINE HCL 50 MG PO TABS: 50.0000 mg | ORAL_TABLET | Freq: Every day | ORAL | 3 refills | Status: DC

## 2020-07-19 IMAGING — MG DIGITAL SCREENING BILATERAL MAMMOGRAM WITH IMPLANTS, CAD AND TOM
8 of 16 series · 8 of 32 positions shown · non-contrast
Comparison: Previous exam(s).

CLINICAL DATA: Screening.

EXAM:
DIGITAL SCREENING BILATERAL MAMMOGRAM WITH IMPLANTS, CAD AND TOMO
The patient has retropectoral implants. Standard and implant
displaced views were performed.

[R CC]
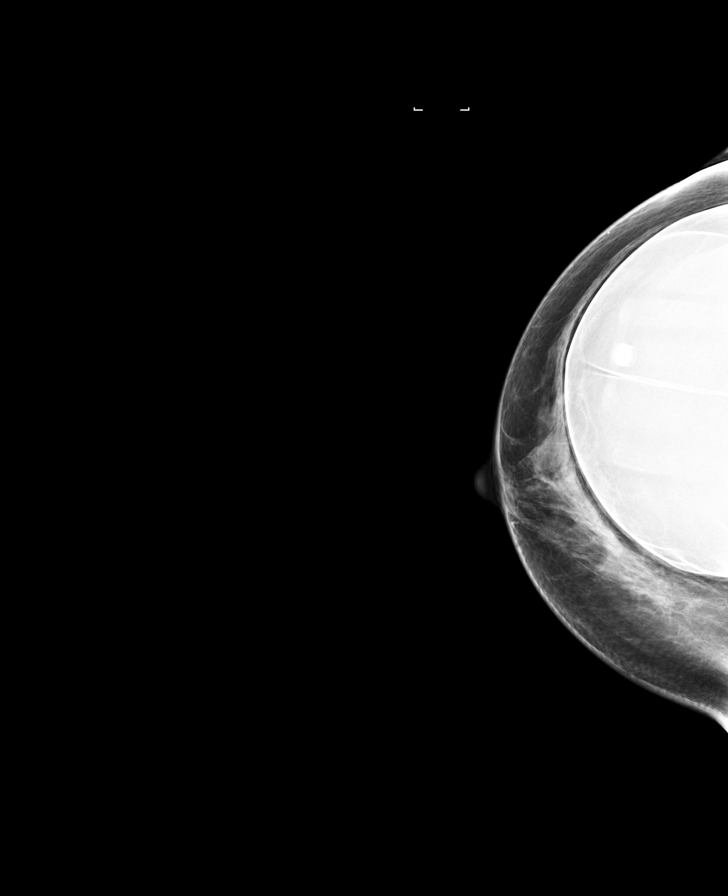

[L CC]
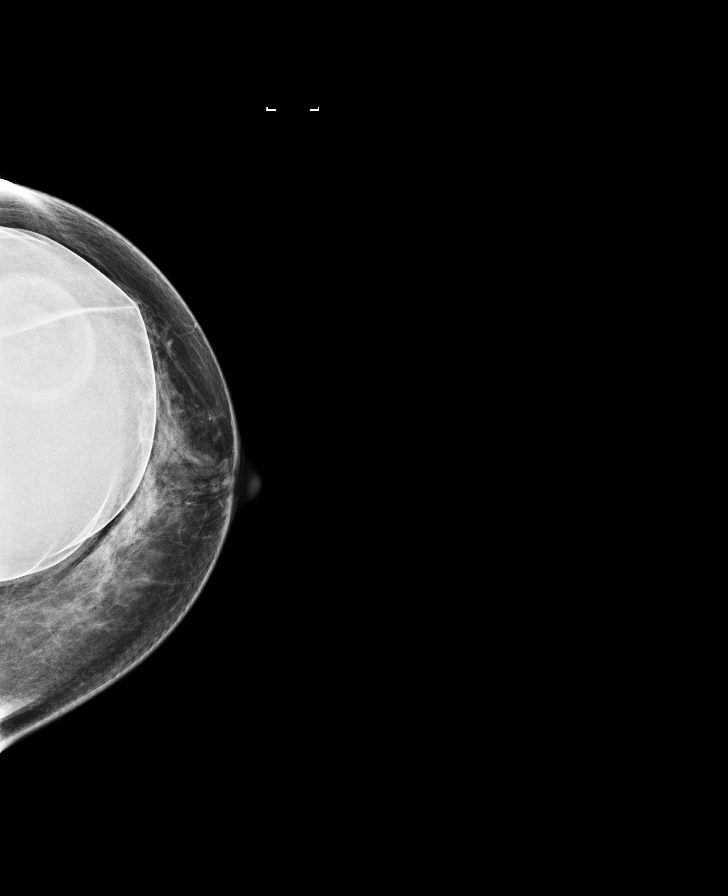

[R MLO]
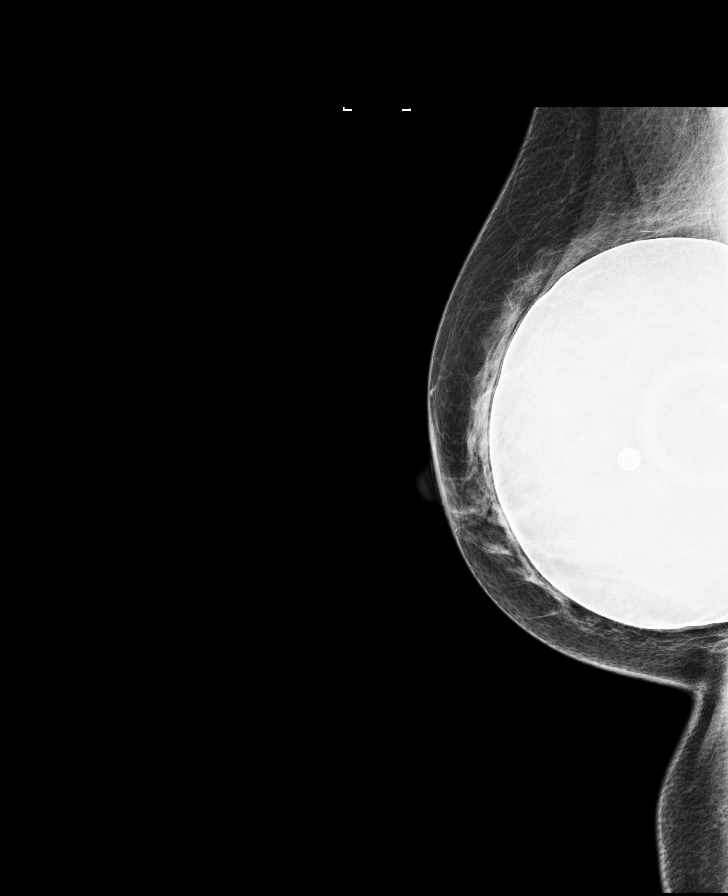

[L MLO (1 of 2)]
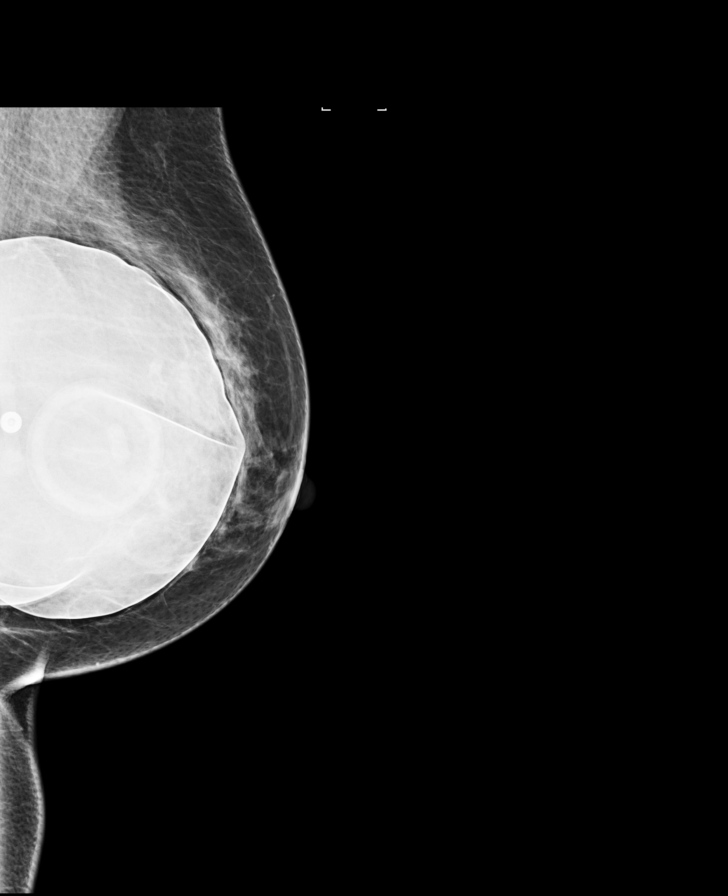

[L MLO synth-2D]
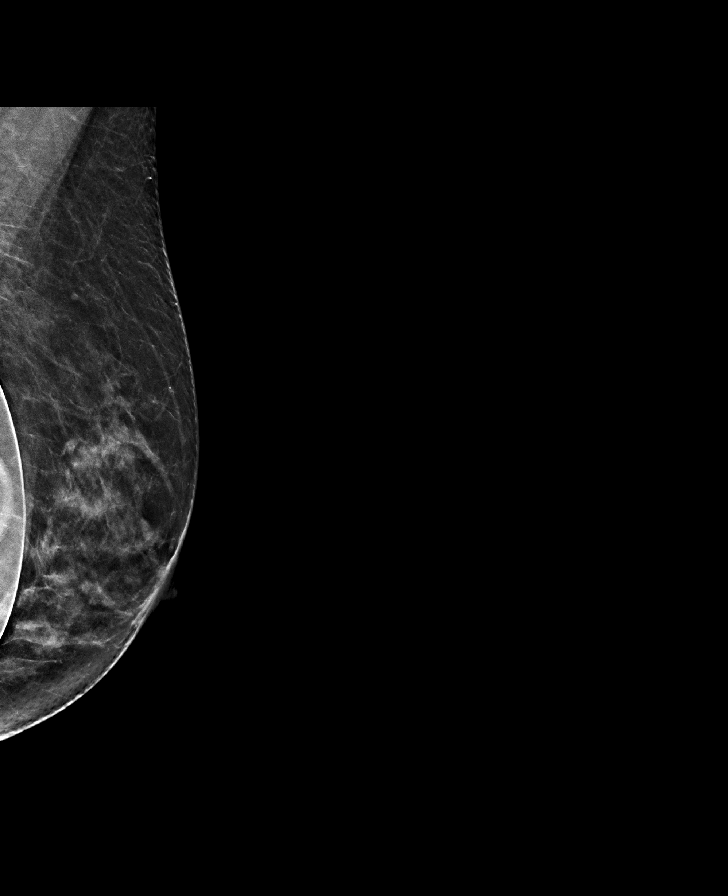

[L MLO (2 of 2)]
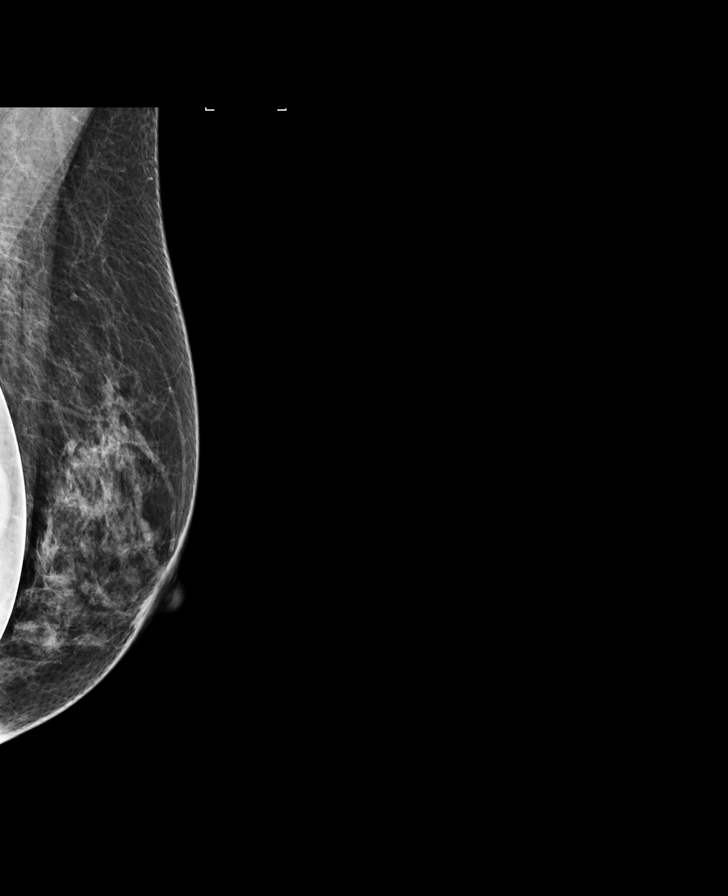

[R CC synth-2D]
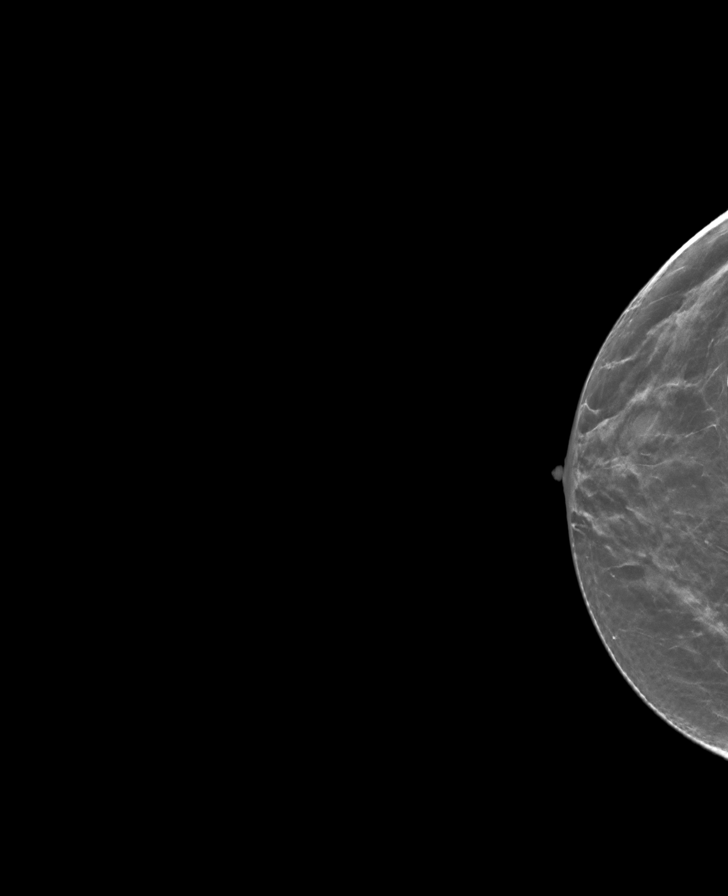

[L CC synth-2D]
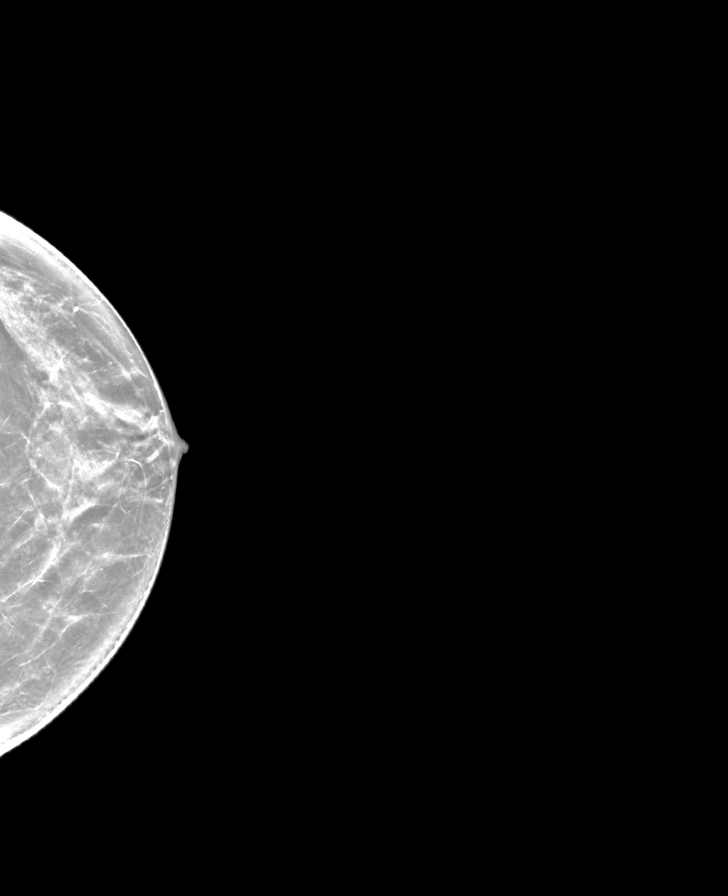

[8 of 32 positions shown; findings below may reference images not displayed]

ACR Breast Density Category c: The breast tissue is heterogeneously
dense, which may obscure small masses.
FINDINGS: In the right breast, a possible asymmetry warrants further
evaluation. In the left breast, no suspicious masses or malignant
type calcifications are identified. Images were processed with CAD.
IMPRESSION: Further evaluation is suggested for possible asymmetry in the right
breast.

RECOMMENDATION:
3D diagnostic mammogram and possibly ultrasound of the right breast.
(Code:VC-1-OOX)

The patient will be contacted regarding the findings, and additional
imaging will be scheduled.

BI-RADS CATEGORY  0: Incomplete. Need additional imaging evaluation
and/or prior mammograms for comparison.

## 2020-07-27 ENCOUNTER — Ambulatory Visit: Payer: Managed Care, Other (non HMO) | Admitting: Internal Medicine

## 2020-08-24 ENCOUNTER — Ambulatory Visit: Payer: Managed Care, Other (non HMO) | Admitting: Internal Medicine

## 2020-09-04 ENCOUNTER — Telehealth (INDEPENDENT_AMBULATORY_CARE_PROVIDER_SITE_OTHER): Payer: Managed Care, Other (non HMO) | Admitting: Physician Assistant

## 2020-09-04 ENCOUNTER — Telehealth: Payer: Self-pay

## 2020-09-04 DIAGNOSIS — Z5329 Procedure and treatment not carried out because of patient's decision for other reasons: Secondary | ICD-10-CM

## 2020-09-04 NOTE — Progress Notes (Signed)
Patient called and canceled same day

## 2020-09-04 NOTE — Telephone Encounter (Signed)
noted 

## 2020-09-04 NOTE — Telephone Encounter (Signed)
FYI

## 2020-09-04 NOTE — Telephone Encounter (Signed)
Copied from [place] [id number]. Topic: General - Other >> Sep 04, 2020 11:24 AM Rainey Pines A wrote: Patient called to cancel appt for today due to her seeing the ent doctor

## 2020-10-11 ENCOUNTER — Encounter: Payer: Self-pay | Admitting: Physician Assistant

## 2020-10-11 DIAGNOSIS — E034 Atrophy of thyroid (acquired): Secondary | ICD-10-CM

## 2020-10-11 MED ORDER — LEVOTHYROXINE SODIUM 25 MCG PO TABS: 25.0000 ug | ORAL_TABLET | Freq: Every day | ORAL | 1 refills | Status: DC

## 2020-10-11 NOTE — Addendum Note (Signed)
Addended by: Mar Daring on: 10/11/2020 07:22 PM   Modules accepted: Orders

## 2020-11-03 ENCOUNTER — Other Ambulatory Visit: Payer: Self-pay | Admitting: Physician Assistant

## 2020-11-03 DIAGNOSIS — A6004 Herpesviral vulvovaginitis: Secondary | ICD-10-CM

## 2020-11-04 NOTE — Telephone Encounter (Signed)
Requested medications are due for refill today yes  Requested medications are on the active medication list yes  Last refill 7/5  Last visit Do not see this med/dx addressed in a visit note  Future visit scheduled no  Notes to clinic Please assess.

## 2020-11-14 ENCOUNTER — Encounter: Payer: Self-pay | Admitting: Radiology

## 2020-12-10 ENCOUNTER — Other Ambulatory Visit: Payer: Self-pay | Admitting: Adult Health

## 2020-12-10 DIAGNOSIS — G4700 Insomnia, unspecified: Secondary | ICD-10-CM

## 2020-12-11 ENCOUNTER — Telehealth: Payer: Self-pay

## 2020-12-11 DIAGNOSIS — F418 Other specified anxiety disorders: Secondary | ICD-10-CM

## 2020-12-11 MED ORDER — SERTRALINE HCL 50 MG PO TABS: 50.0000 mg | ORAL_TABLET | Freq: Every day | ORAL | 0 refills | Status: DC

## 2020-12-11 NOTE — Telephone Encounter (Signed)
[facility] faxed refill request for the following medications:   sertraline (ZOLOFT) 50 MG tablet  Please advise.  Thanks, [employer]

## 2020-12-15 ENCOUNTER — Ambulatory Visit: Payer: Managed Care, Other (non HMO) | Admitting: Physician Assistant

## 2020-12-18 ENCOUNTER — Ambulatory Visit: Payer: Managed Care, Other (non HMO) | Admitting: Hospice and Palliative Medicine

## 2020-12-18 ENCOUNTER — Encounter: Payer: Self-pay | Admitting: Hospice and Palliative Medicine

## 2020-12-18 ENCOUNTER — Other Ambulatory Visit: Payer: Self-pay

## 2020-12-18 VITALS — BP 106/63 | HR 86 | Temp 97.6°F | Resp 16 | Ht 62.0 in | Wt 132.4 lb

## 2020-12-18 DIAGNOSIS — J309 Allergic rhinitis, unspecified: Secondary | ICD-10-CM | POA: Diagnosis not present

## 2020-12-18 DIAGNOSIS — R0602 Shortness of breath: Secondary | ICD-10-CM | POA: Diagnosis not present

## 2020-12-18 DIAGNOSIS — G4700 Insomnia, unspecified: Secondary | ICD-10-CM

## 2020-12-18 MED ORDER — ESZOPICLONE 3 MG PO TABS: 3.0000 mg | ORAL_TABLET | Freq: Every day | ORAL | 1 refills | Status: DC

## 2020-12-18 NOTE — Progress Notes (Signed)
[facility] [place], [place] [address]  Pulmonary Sleep Medicine   Office Visit Note  Patient Name: Theresa Walton DOB: Jun 05, 1966 MRN [id number]  Date of Service: 12/18/2020  Complaints/HPI: Patient is here for routine pulmonary follow-up Tested positive for COVID last month--took several weeks for symptoms to recover but she is feeling better History of smoking--no PFT or spirometry on file Followed by ENT for chronic allergies, recently had balloon sinuplasty, recovered well from procedure, allergies and sinuses remain well controlled Continues to have issues with restful sleep, has difficulty falling and staying asleep--easily aroused in her sleep, daytime sleepiness--does not feel rested when she wakes up or throughout the day Continues to take Lunesta as needed--finds this somewhat helpful, takes a few times per week Has been tried on multiple different medication therapies in the past Also taking extended release Melatonin each night Has had PSG as well as MSLT studies the past--insignificant findings Has also researched sleep hygiene and is following all recommendations Occasionally has visits with her therapist--was recently started on Zoloft by her PCP for increased anxiety, has not been seen by psychiatrist or a therapist that specializes in sleep medicine  ROS  General: (-) fever, (-) chills, (-) night sweats, (-) weakness Skin: (-) rashes, (-) itching,. Eyes: (-) visual changes, (-) redness, (-) itching. Nose and Sinuses: (-) nasal stuffiness or itchiness, (-) postnasal drip, (-) nosebleeds, (-) sinus trouble. Mouth and Throat: (-) sore throat, (-) hoarseness. Neck: (-) swollen glands, (-) enlarged thyroid, (-) neck pain. Respiratory: - cough, (-) bloody sputum, - shortness of breath, - wheezing. Cardiovascular: - ankle swelling, (-) chest pain. Lymphatic: (-) lymph node enlargement. Neurologic: (-) numbness, (-) tingling. Psychiatric: (-)  anxiety, (-) depression   Current Medication: Outpatient Encounter Medications as of 12/18/2020  Medication Sig  . cyclobenzaprine (FLEXERIL) 5 MG tablet TAKE 1 TO 2 TABLETS BY MOUTH AT BEDTIME  . azelastine (ASTELIN) 0.1 % nasal spray Place into the nose.  . Azelastine-Fluticasone 137-50 MCG/ACT SUSP INSTILL 1 SPRAY INTO EACH NOSTRIL TWICE A DAY  . cetirizine (ZYRTEC) 5 MG tablet Take 5 mg by mouth daily.  Marland Kitchen estradiol (VIVELLE-DOT) 0.0375 MG/24HR Place 1 patch onto the skin 2 (two) times a week.  . Eszopiclone 3 MG TABS Take 1 tablet (3 mg total) by mouth at bedtime.  Marland Kitchen levocetirizine (XYZAL) 5 MG tablet 1 (one) time each day in the evening.  Marland Kitchen levothyroxine (SYNTHROID) 25 MCG tablet Take 1 tablet (25 mcg total) by mouth daily before breakfast.  . metroNIDAZOLE (METROCREAM) 0.75 % cream metronidazole 0.75 % topical cream  . montelukast (SINGULAIR) 10 MG tablet Take 10 mg by mouth daily.  . sertraline (ZOLOFT) 50 MG tablet Take 1 tablet (50 mg total) by mouth at bedtime.  . valACYclovir (VALTREX) 500 MG tablet TAKE 1 TABLET BY MOUTH TWICE DAILY  . [DISCONTINUED] Eszopiclone 3 MG TABS Take 1 tablet (3 mg total) by mouth at bedtime.   No facility-administered encounter medications on file as of 12/18/2020.    Surgical History: Past Surgical History:  Procedure Laterality Date  . ABDOMINAL HYSTERECTOMY    . ANTERIOR CRUCIATE LIGAMENT REPAIR Right 2012  . ARTHROSCOPIC REPAIR ACL  2013   Right knee  . AUGMENTATION MAMMAPLASTY Bilateral 2000   saline  . BLADDER REPAIR    . BREAST ENHANCEMENT SURGERY  1995  . COLONOSCOPY WITH PROPOFOL N/A 08/29/2017   Procedure: COLONOSCOPY WITH PROPOFOL;  Surgeon: Jonathon Bellows, MD;  Location: [facility] ENDOSCOPY;  Service: Gastroenterology;  Laterality: N/A;  . CYSTOCELE REPAIR  2013  . ENDOMETRIAL ABLATION  2013  . [place], 2001  . PARTIAL HYSTERECTOMY  2015  . PARTIAL HYSTERECTOMY Bilateral 2014  . RECTOPEXY  2011  .  TONSILECTOMY, ADENOIDECTOMY, BILATERAL MYRINGOTOMY AND TUBES  1984  . TOOTH EXTRACTION      Medical History: Past Medical History:  Diagnosis Date  . Basal cell carcinoma (BCC) of jawline 06/26/2018  . 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 Genital herpes   . Hypothyroidism   . Seasonal allergies   . Syncope and collapse   . Thyroid disease    hypothyroidism    Family History: Family History  Problem Relation Age of Onset  . Pulmonary fibrosis Mother   . Healthy Sister   . Healthy Brother   . Healthy Sister   . Lung cancer Paternal Grandmother   . Arrhythmia Father   . Heart disease Father   . Prostate cancer Neg Hx   . Bladder Cancer Neg Hx   . Kidney cancer Neg Hx   . Breast cancer Neg Hx     Social History: Social History   Socioeconomic History  . Marital status: Divorced    Spouse name: Not on file  . Number of children: Not on file  . Years of education: Not on file  . Highest education level: Not on file  Occupational History  . Not on file  Tobacco Use  . Smoking status: Former Smoker    Types: Cigarettes    Quit date: 05/15/2015    Years since quitting: 5.6  . Smokeless tobacco: Never Used  Vaping Use  . Vaping Use: Never used  Substance and Sexual Activity  . Alcohol use: No    Alcohol/week: 0.0 standard drinks  . Drug use: No  . Sexual activity: Never    Partners: Male    Birth control/protection: Surgical  Other Topics Concern  . Not on file  Social History Narrative  . Not on file   Social Determinants of Health   Financial Resource Strain: Not on file  Food Insecurity: Not on file  Transportation Needs: Not on file  Physical Activity: Not on file  Stress: Not on file  Social Connections: Not on file  Intimate Partner Violence: Not on file    Vital Signs: Blood pressure 106/63, pulse 86, temperature 97.6 F (36.4 C), resp. rate 16, height 5\' 2"  (1.575 m), weight 132 lb 6.4 oz (60.1 kg), SpO2 98 %.  Examination: General  Appearance: The patient is well-developed, well-nourished, and in no distress. Skin: Gross inspection of skin unremarkable. Head: normocephalic, no gross deformities. Eyes: no gross deformities noted. ENT: ears appear grossly normal no exudates. Neck: Supple. No thyromegaly. No LAD. Respiratory: Clear throughout, no rhonchi, rales or wheezing noted. Cardiovascular: Normal S1 and S2 without murmur or rub. Extremities: No cyanosis. pulses are equal. Neurologic: Alert and oriented. No involuntary movements.  LABS: No results found for this or any previous visit (from the past 2160 hour(s)).  Radiology: MM 3D SCREEN BREAST W/IMPLANT BILATERAL  Result Date: 06/23/2020 CLINICAL DATA:  Screening. EXAM: DIGITAL SCREENING BILATERAL MAMMOGRAM WITH IMPLANTS, CAD AND TOMO The patient has retropectoral implants. Standard and implant displaced views were performed. COMPARISON:  Previous exam(s). ACR Breast Density Category c: The breast tissue is heterogeneously dense, which may obscure small masses. FINDINGS: There are no findings suspicious for malignancy. Images were processed with CAD. IMPRESSION: No mammographic evidence of malignancy. A result letter of  this screening mammogram will be mailed directly to the patient. RECOMMENDATION: Screening mammogram in one year. (Code:[id number]) BI-RADS CATEGORY  1:  Negative. Electronically Signed   By: Claudie Revering M.D.   On: 06/23/2020 16:25    No results found.  No results found.    Assessment and Plan: Patient Active Problem List   Diagnosis Date Noted  . Edema 08/05/2018  . Impingement syndrome of shoulder region 08/05/2018  . Knee joint effusion 08/05/2018  . Old anterior cruciate ligament disruption 08/05/2018  . Patellar tendonitis 08/05/2018  . Basal cell carcinoma (BCC) of jawline 06/26/2018  . History of pubovaginal sling 04/01/2017  . Essential tremor 05/15/2016  . Allergic rhinitis 06/22/2015  . B12 deficiency 06/21/2015  . H/O:  hysterectomy 06/21/2015  . Hypertriglyceridemia 06/21/2015  . Hypoglycemia 06/21/2015  . Menopausal and perimenopausal disorder 06/21/2015  . Vasovagal symptom 06/21/2015  . Insomnia 04/19/2015  . Female stress incontinence 03/24/2012  . Adult hypothyroidism 08/28/2009  . Avitaminosis D 08/28/2009  . Chronic infection of sinus 02/01/2009  . Adaptation reaction 09/09/2008  . Genital herpes simplex type 1 infection 07/13/2008    1. Allergic rhinitis, unspecified seasonality, unspecified trigger Symptoms remain stable, followed by ENT, s/p balloon plasty  2. Insomnia, unspecified type Refills of Lunesta provided today--has exhausted several other medication options at this point Discussed Seroquel as well as Remeron but suggest she see discuss with psychiatrist/therapist Also provided with CBTi programs for insomnia - Eszopiclone 3 MG TABS; Take 1 tablet (3 mg total) by mouth at bedtime.  Dispense: 30 tablet; Refill: 1  3. SOB (shortness of breath) Spirometry post COVID normal - Spirometry with Graph  General Counseling: I have discussed the findings of the evaluation and examination with Benetta.  I have also discussed any further diagnostic evaluation thatmay be needed or ordered today. Ailynn verbalizes understanding of the findings of todays visit. We also reviewed her medications today and discussed drug interactions and side effects including but not limited excessive drowsiness and altered mental states. We also discussed that there is always a risk not just to her but also people around her. she has been encouraged to call the office with any questions or concerns that should arise related to todays visit.  Orders Placed This Encounter  Procedures  . Spirometry with Graph    Order Specific Question:   Where should this test be performed?    Answer:   [facility]    Order Specific Question:   Basic spirometry    Answer:   Yes     Time spent: 30  I have personally  obtained a history, examined the patient, evaluated laboratory and imaging results, formulated the assessment and plan and placed orders. This patient was seen by Casey Burkitt AGNP-C in Collaboration with Dr. Devona Konig as a part of collaborative care agreement.    Allyne Gee, MD [facility] Pulmonary and Critical Care Sleep medicine

## 2021-01-08 ENCOUNTER — Telehealth: Payer: Self-pay

## 2021-01-08 DIAGNOSIS — F418 Other specified anxiety disorders: Secondary | ICD-10-CM

## 2021-01-08 MED ORDER — SERTRALINE HCL 50 MG PO TABS
50.0000 mg | ORAL_TABLET | Freq: Every day | ORAL | 1 refills | Status: DC
Start: 2021-01-08 — End: 2021-06-28

## 2021-01-08 NOTE — Telephone Encounter (Signed)
[facility] faxed refill request for the following medications:  sertraline (ZOLOFT) 50 MG tablet    Please advise.  

## 2021-01-10 ENCOUNTER — Other Ambulatory Visit: Payer: Self-pay

## 2021-01-10 ENCOUNTER — Ambulatory Visit (INDEPENDENT_AMBULATORY_CARE_PROVIDER_SITE_OTHER): Payer: Managed Care, Other (non HMO) | Admitting: *Deleted

## 2021-01-10 ENCOUNTER — Encounter: Payer: Self-pay | Admitting: Physician Assistant

## 2021-01-10 VITALS — BP 106/72 | HR 78

## 2021-01-10 DIAGNOSIS — R350 Frequency of micturition: Secondary | ICD-10-CM | POA: Diagnosis not present

## 2021-01-10 LAB — POCT URINALYSIS DIPSTICK
Blood, UA: NEGATIVE
Leukocytes, UA: NEGATIVE

## 2021-01-10 NOTE — Progress Notes (Signed)
ATTESTATION OF SUPERVISION OF RN: Evaluation and management procedures were performed by the RN under my supervision and collaboration. I have reviewed the nursing note and chart and agree with the management and plan for this patient.  Jazmon Kos, CNM  

## 2021-01-10 NOTE — Progress Notes (Signed)
SUBJECTIVE: Theresa Walton is a 55 y.o. female who complains of urinary frequency and low back pain. Denies fever, chills, or abnormal vaginal discharge or bleeding.   OBJECTIVE: Appears well, in no apparent distress.  Vital signs are normal. Urine dipstick shows negative for all components.    ASSESSMENT: Urinary Frequency  PLAN: Will send urine culture and will treat if culture comes back positive. Call or return to clinic prn if these symptoms worsen or fail to improve as anticipated.

## 2021-01-10 NOTE — Progress Notes (Signed)
Established patient visit   Patient: Theresa Walton   DOB: 1966-10-23   55 y.o. Female  MRN: [id number] Visit Date: 01/11/2021  Today's healthcare provider: Trinna Post, PA-C   Chief Complaint  Patient presents with  . Urinary Frequency  I,Jla Reynolds M Blaze Nylund,acting as a scribe for Trinna Post, PA-C.,have documented all relevant documentation on the behalf of Trinna Post, PA-C,as directed by  Trinna Post, PA-C while in the presence of Trinna Post, PA-C.  Subjective    Urinary Frequency  This is a new problem. The current episode started in the past 7 days. The problem occurs every urination. The problem has been unchanged. The pain is at a severity of 0/10. The patient is experiencing no pain. There has been no fever. Associated symptoms include frequency and nausea. Pertinent negatives include no chills, discharge, flank pain, hesitancy, urgency or vomiting.    She has a history of recurrent UTIs which have recently improved with addition of hormones.      Medications: Outpatient Medications Prior to Visit  Medication Sig  . azelastine (ASTELIN) 0.1 % nasal spray Place into the nose.  . Azelastine-Fluticasone 137-50 MCG/ACT SUSP INSTILL 1 SPRAY INTO EACH NOSTRIL TWICE A DAY  . cetirizine (ZYRTEC) 5 MG tablet Take 5 mg by mouth daily.  . cyclobenzaprine (FLEXERIL) 5 MG tablet TAKE 1 TO 2 TABLETS BY MOUTH AT BEDTIME  . estradiol (VIVELLE-DOT) 0.0375 MG/24HR Place 1 patch onto the skin 2 (two) times a week.  . Eszopiclone 3 MG TABS Take 1 tablet (3 mg total) by mouth at bedtime.  Marland Kitchen levocetirizine (XYZAL) 5 MG tablet 1 (one) time each day in the evening.  Marland Kitchen levothyroxine (SYNTHROID) 25 MCG tablet Take 1 tablet (25 mcg total) by mouth daily before breakfast.  . metroNIDAZOLE (METROCREAM) 0.75 % cream metronidazole 0.75 % topical cream  . montelukast (SINGULAIR) 10 MG tablet Take 10 mg by mouth daily.  . sertraline (ZOLOFT) 50 MG tablet Take 1 tablet (50 mg  total) by mouth at bedtime.  . valACYclovir (VALTREX) 500 MG tablet TAKE 1 TABLET BY MOUTH TWICE DAILY   No facility-administered medications prior to visit.    Review of Systems  Constitutional: Positive for fatigue. Negative for chills and fever.  HENT: Negative for congestion, sinus pressure, sinus pain and sore throat.   Respiratory: Negative for cough, chest tightness, shortness of breath and wheezing.   Gastrointestinal: Positive for diarrhea and nausea. Negative for abdominal pain and vomiting.  Genitourinary: Positive for frequency. Negative for flank pain, hesitancy, urgency, vaginal discharge and vaginal pain.  Musculoskeletal: Positive for back pain.       Objective    BP 106/79 (BP Location: Left Arm, Patient Position: Sitting, Cuff Size: Normal)   Pulse 71   Temp 98.1 F (36.7 C) (Oral)   Wt 132 lb 12.8 oz (60.2 kg)   SpO2 99%   BMI 24.29 kg/m     Physical Exam Constitutional:      Appearance: Normal appearance.  Skin:    General: Skin is warm and dry.  Neurological:     General: No focal deficit present.     Mental Status: She is alert and oriented to person, place, and time.  Psychiatric:        Mood and Affect: Mood normal.        Behavior: Behavior normal.       Results for orders placed or performed in visit on 01/11/21  TSH+T4F+T3Free  Result Value Ref Range   TSH 3.560 0.450 - 4.500 uIU/mL   T3, Free 2.7 2.0 - 4.4 pg/mL   Free T4 1.40 0.82 - 1.77 ng/dL  POCT urinalysis dipstick  Result Value Ref Range   Color, UA Yellow    Clarity, UA Clear    Glucose, UA Negative Negative   Bilirubin, UA Negative    Ketones, UA Negative    Spec Grav, UA 1.010 1.010 - 1.025   Blood, UA Negative    pH, UA 6.0 5.0 - 8.0   Protein, UA Negative Negative   Urobilinogen, UA 0.2 0.2 or 1.0 E.U./dL   Nitrite, UA Negative    Leukocytes, UA Negative Negative   Appearance     Odor      Assessment & Plan    1. Urinary frequency  She gave a urine culture  and sample to [facility] yesterday. Culture is negative. Prescribed abx at patient request but she may stop.   - POCT urinalysis dipstick - CULTURE, URINE COMPREHENSIVE - NuSwab Vaginitis Plus (VG+)  2. Dysuria  - CULTURE, URINE COMPREHENSIVE - NuSwab Vaginitis Plus (VG+)  3. Adult hypothyroidism  - TSH+T4F+T3Free   Return if symptoms worsen or fail to improve.      ITrinna Post, PA-C, have reviewed all documentation for this visit. The documentation on 01/12/21 for the exam, diagnosis, procedures, and orders are all accurate and complete.  The entirety of the information documented in the History of Present Illness, Review of Systems and Physical Exam were personally obtained by me. Portions of this information were initially documented by [facility] and reviewed by me for thoroughness and accuracy.     Paulene Floor  [facility] [phone] (phone) [phone] (fax)  [place]

## 2021-01-11 ENCOUNTER — Encounter: Payer: Self-pay | Admitting: Physician Assistant

## 2021-01-11 ENCOUNTER — Other Ambulatory Visit: Payer: Self-pay

## 2021-01-11 ENCOUNTER — Telehealth: Payer: Self-pay | Admitting: Physician Assistant

## 2021-01-11 ENCOUNTER — Ambulatory Visit: Payer: Managed Care, Other (non HMO) | Admitting: Physician Assistant

## 2021-01-11 VITALS — BP 106/79 | HR 71 | Temp 98.1°F | Wt 132.8 lb

## 2021-01-11 DIAGNOSIS — R350 Frequency of micturition: Secondary | ICD-10-CM

## 2021-01-11 DIAGNOSIS — R300 Dysuria: Secondary | ICD-10-CM

## 2021-01-11 DIAGNOSIS — E039 Hypothyroidism, unspecified: Secondary | ICD-10-CM

## 2021-01-11 LAB — POCT URINALYSIS DIPSTICK
Bilirubin, UA: NEGATIVE
Blood, UA: NEGATIVE
Glucose, UA: NEGATIVE
Ketones, UA: NEGATIVE
Leukocytes, UA: NEGATIVE
Nitrite, UA: NEGATIVE
Protein, UA: NEGATIVE
Spec Grav, UA: 1.01 (ref 1.010–1.025)
Urobilinogen, UA: 0.2 E.U./dL
pH, UA: 6 (ref 5.0–8.0)

## 2021-01-11 MED ORDER — DOXYCYCLINE HYCLATE 100 MG PO TABS: 100.0000 mg | ORAL_TABLET | Freq: Two times a day (BID) | ORAL | 0 refills | Status: DC

## 2021-01-11 NOTE — Telephone Encounter (Signed)
Doxycycline sent in.

## 2021-01-11 NOTE — Telephone Encounter (Addendum)
Pt saw Fabio Bering this am for possible UTI.   Pt was asked if she wanted to start abx before culture gets back- And she does.  Pt did rapid covid test and it was negative   [id number] - [address], [place]   .

## 2021-01-11 NOTE — Patient Instructions (Signed)
Urinary Tract Infection, Adult  A urinary tract infection (UTI) is an infection of any part of the urinary tract. The urinary tract includes the kidneys, ureters, bladder, and urethra. These organs make, store, and get rid of urine in the body. An upper UTI affects the ureters and kidneys. A lower UTI affects the bladder and urethra. What are the causes? Most urinary tract infections are caused by bacteria in your genital area around your urethra, where urine leaves your body. These bacteria grow and cause inflammation of your urinary tract. What increases the risk? You are more likely to develop this condition if:  You have a urinary catheter that stays in place.  You are not able to control when you urinate or have a bowel movement (incontinence).  You are female and you: ? Use a spermicide or diaphragm for birth control. ? Have low estrogen levels. ? Are pregnant.  You have certain genes that increase your risk.  You are sexually active.  You take antibiotic medicines.  You have a condition that causes your flow of urine to slow down, such as: ? An enlarged prostate, if you are female. ? Blockage in your urethra. ? A kidney stone. ? A nerve condition that affects your bladder control (neurogenic bladder). ? Not getting enough to drink, or not urinating often.  You have certain medical conditions, such as: ? Diabetes. ? A weak disease-fighting system (immunesystem). ? Sickle cell disease. ? Gout. ? Spinal cord injury. What are the signs or symptoms? Symptoms of this condition include:  Needing to urinate right away (urgency).  Frequent urination. This may include small amounts of urine each time you urinate.  Pain or burning with urination.  Blood in the urine.  Urine that smells bad or unusual.  Trouble urinating.  Cloudy urine.  Vaginal discharge, if you are female.  Pain in the abdomen or the lower back. You may also have:  Vomiting or a decreased  appetite.  Confusion.  Irritability or tiredness.  A fever or chills.  Diarrhea. The first symptom in older adults may be confusion. In some cases, they may not have any symptoms until the infection has worsened. How is this diagnosed? This condition is diagnosed based on your medical history and a physical exam. You may also have other tests, including:  Urine tests.  Blood tests.  Tests for STIs (sexually transmitted infections). If you have had more than one UTI, a cystoscopy or imaging studies may be done to determine the cause of the infections. How is this treated? Treatment for this condition includes:  Antibiotic medicine.  Over-the-counter medicines to treat discomfort.  Drinking enough water to stay hydrated. If you have frequent infections or have other conditions such as a kidney stone, you may need to see a health care provider who specializes in the urinary tract (urologist). In rare cases, urinary tract infections can cause sepsis. Sepsis is a life-threatening condition that occurs when the body responds to an infection. Sepsis is treated in the hospital with IV antibiotics, fluids, and other medicines. Follow these instructions at home: Medicines  Take over-the-counter and prescription medicines only as told by your health care provider.  If you were prescribed an antibiotic medicine, take it as told by your health care provider. Do not stop using the antibiotic even if you start to feel better. General instructions  Make sure you: ? Empty your bladder often and completely. Do not hold urine for long periods of time. ? Empty your bladder after   sex. ? Wipe from front to back after urinating or having a bowel movement if you are female. Use each tissue only one time when you wipe.  Drink enough fluid to keep your urine pale yellow.  Keep all follow-up visits. This is important.   Contact a health care provider if:  Your symptoms do not get better after 1-2  days.  Your symptoms go away and then return. Get help right away if:  You have severe pain in your back or your lower abdomen.  You have a fever or chills.  You have nausea or vomiting. Summary  A urinary tract infection (UTI) is an infection of any part of the urinary tract, which includes the kidneys, ureters, bladder, and urethra.  Most urinary tract infections are caused by bacteria in your genital area.  Treatment for this condition often includes antibiotic medicines.  If you were prescribed an antibiotic medicine, take it as told by your health care provider. Do not stop using the antibiotic even if you start to feel better.  Keep all follow-up visits. This is important. This information is not intended to replace advice given to you by your health care provider. Make sure you discuss any questions you have with your health care provider. Document Revised: 05/26/2020 Document Reviewed: 05/26/2020 Elsevier Patient Education  2021 Elsevier Inc.  

## 2021-01-12 LAB — TSH+T4F+T3FREE
Free T4: 1.4 ng/dL (ref 0.82–1.77)
T3, Free: 2.7 pg/mL (ref 2.0–4.4)
TSH: 3.56 u[IU]/mL (ref 0.450–4.500)

## 2021-01-12 LAB — URINE CULTURE

## 2021-01-12 NOTE — Telephone Encounter (Signed)
Patient was advised via detailed vm.

## 2021-01-14 LAB — NUSWAB VAGINITIS PLUS (VG+)
Candida albicans, NAA: NEGATIVE
Candida glabrata, NAA: NEGATIVE
Chlamydia trachomatis, NAA: NEGATIVE
Neisseria gonorrhoeae, NAA: NEGATIVE
Trich vag by NAA: NEGATIVE

## 2021-01-15 NOTE — Telephone Encounter (Signed)
Pt stated medication did not work, Pt is still having symptoms and discomfort. Please advise.

## 2021-01-15 NOTE — Telephone Encounter (Signed)
Culture is looking positive this time but has not resulted the sensitivities. If you are ok waiting like one more day to see what it may be resistant to any antibiotics.

## 2021-01-16 ENCOUNTER — Ambulatory Visit: Payer: Managed Care, Other (non HMO) | Admitting: Family Medicine

## 2021-01-16 ENCOUNTER — Other Ambulatory Visit: Payer: Self-pay

## 2021-01-16 ENCOUNTER — Other Ambulatory Visit: Payer: Self-pay | Admitting: Physician Assistant

## 2021-01-16 ENCOUNTER — Encounter: Payer: Self-pay | Admitting: Family Medicine

## 2021-01-16 VITALS — BP 119/82 | HR 70 | Temp 98.0°F | Wt 132.0 lb

## 2021-01-16 DIAGNOSIS — N3000 Acute cystitis without hematuria: Secondary | ICD-10-CM

## 2021-01-16 DIAGNOSIS — K5792 Diverticulitis of intestine, part unspecified, without perforation or abscess without bleeding: Secondary | ICD-10-CM

## 2021-01-16 MED ORDER — METRONIDAZOLE 500 MG PO TABS: 500.0000 mg | ORAL_TABLET | Freq: Three times a day (TID) | ORAL | 0 refills | Status: DC

## 2021-01-16 MED ORDER — CIPROFLOXACIN HCL 500 MG PO TABS: 500.0000 mg | ORAL_TABLET | Freq: Two times a day (BID) | ORAL | 0 refills | Status: AC

## 2021-01-16 NOTE — Progress Notes (Signed)
Established patient visit   Patient: Theresa Walton   DOB: 1966-06-05   55 y.o. Female  MRN: [id number] Visit Date: 01/16/2021  Today's healthcare provider: Lavon Paganini, MD   Chief Complaint  Patient presents with  . Urinary Frequency  I,Daanish Copes,acting as a scribe for Lavon Paganini, MD.,have documented all relevant documentation on the behalf of Lavon Paganini, MD,as directed by  Lavon Paganini, MD while in the presence of Lavon Paganini, MD.  Subjective    Urinary Frequency  This is a recurrent problem. The current episode started 1 to 4 weeks ago. The problem occurs every urination. The problem has been unchanged. The pain is at a severity of 0/10. The patient is experiencing no pain. There has been no fever. Associated symptoms include frequency, nausea and urgency. Pertinent negatives include no discharge, hesitancy or vomiting. She has tried nothing for the symptoms. The treatment provided no relief. Her past medical history is significant for recurrent UTIs.  Patient was prescribed doxycycline 100 MG for 7 days and states she never started the medication.   More bloated and gassy with diarrhea. Soft food x7 days. No improvement, LLQ pain - like someone is pushing on it. +fever, subjective.  Social History   Tobacco Use  . Smoking status: Former Smoker    Types: Cigarettes    Quit date: 05/15/2015    Years since quitting: 5.6  . Smokeless tobacco: Never Used  Vaping Use  . Vaping Use: Never used  Substance Use Topics  . Alcohol use: No    Alcohol/week: 0.0 standard drinks  . Drug use: No       Medications: Outpatient Medications Prior to Visit  Medication Sig  . azelastine (ASTELIN) 0.1 % nasal spray Place into the nose.  . Azelastine-Fluticasone 137-50 MCG/ACT SUSP INSTILL 1 SPRAY INTO EACH NOSTRIL TWICE A DAY  . cetirizine (ZYRTEC) 5 MG tablet Take 5 mg by mouth daily.  . cyclobenzaprine (FLEXERIL) 5 MG tablet TAKE 1 TO 2 TABLETS  BY MOUTH AT BEDTIME  . estradiol (VIVELLE-DOT) 0.0375 MG/24HR Place 1 patch onto the skin 2 (two) times a week.  . Eszopiclone 3 MG TABS Take 1 tablet (3 mg total) by mouth at bedtime.  Marland Kitchen levocetirizine (XYZAL) 5 MG tablet 1 (one) time each day in the evening.  Marland Kitchen levothyroxine (SYNTHROID) 25 MCG tablet Take 1 tablet (25 mcg total) by mouth daily before breakfast.  . metroNIDAZOLE (METROCREAM) 0.75 % cream metronidazole 0.75 % topical cream  . montelukast (SINGULAIR) 10 MG tablet Take 10 mg by mouth daily.  . sertraline (ZOLOFT) 50 MG tablet Take 1 tablet (50 mg total) by mouth at bedtime.  . valACYclovir (VALTREX) 500 MG tablet TAKE 1 TABLET BY MOUTH TWICE DAILY  . [DISCONTINUED] doxycycline (VIBRA-TABS) 100 MG tablet Take 1 tablet (100 mg total) by mouth 2 (two) times daily for 7 days.   No facility-administered medications prior to visit.    Review of Systems  Gastrointestinal: Positive for abdominal pain, diarrhea and nausea. Negative for vomiting.  Genitourinary: Positive for frequency and urgency. Negative for decreased urine volume, hesitancy, vaginal bleeding, vaginal discharge and vaginal pain.  Musculoskeletal: Positive for back pain.       Objective    BP 119/82 (BP Location: Left Arm, Patient Position: Sitting, Cuff Size: Normal)   Pulse 70   Temp 98 F (36.7 C) (Oral)   Wt 132 lb (59.9 kg)   SpO2 100%   BMI 24.14 kg/m  Physical Exam Vitals reviewed.  Constitutional:      General: She is not in acute distress.    Appearance: Normal appearance. She is well-developed. She is not diaphoretic.  HENT:     Head: Normocephalic and atraumatic.  Eyes:     General: No scleral icterus.    Conjunctiva/sclera: Conjunctivae normal.  Neck:     Thyroid: No thyromegaly.  Cardiovascular:     Rate and Rhythm: Normal rate and regular rhythm.     Pulses: Normal pulses.     Heart sounds: Normal heart sounds. No murmur heard.   Pulmonary:     Effort: Pulmonary effort is  normal. No respiratory distress.     Breath sounds: Normal breath sounds. No wheezing, rhonchi or rales.  Abdominal:     General: Bowel sounds are normal. There is no distension.     Palpations: Abdomen is soft.     Tenderness: There is abdominal tenderness (LLQ). There is no right CVA tenderness, left CVA tenderness, guarding or rebound.  Musculoskeletal:     Cervical back: Neck supple.     Right lower leg: No edema.     Left lower leg: No edema.  Lymphadenopathy:     Cervical: No cervical adenopathy.  Skin:    General: Skin is warm and dry.     Findings: No rash.  Neurological:     Mental Status: She is alert and oriented to person, place, and time. Mental status is at baseline.  Psychiatric:        Mood and Affect: Mood normal.        Behavior: Behavior normal.       No results found for any visits on 01/16/21.  Assessment & Plan     1. Acute cystitis without hematuria - previously assumed to have UTI, but UCx resulted today - reviewed results with patient that show E. coli UTI -Her urinary symptoms do seem consistent with cystitis -She has not taken doxycycline, which I will discontinue given I will be treating her with Cipro/Flagyl as below for diverticulitis and Cipro will treat her UTI as well -Discussed return precautions  2. Diverticulitis -New problem -Left lower quadrant pain x1 week with associated diarrhea and fever -Known diverticulosis -We will treat with 7-day course of Cipro/Flagyl -Encourage probiotic use -Continue soft/brat diet -Return precautions discussed -Consider CT scan if not improving   Meds ordered this encounter  Medications  . ciprofloxacin (CIPRO) 500 MG tablet    Sig: Take 1 tablet (500 mg total) by mouth 2 (two) times daily for 7 days.    Dispense:  14 tablet    Refill:  0  . metroNIDAZOLE (FLAGYL) 500 MG tablet    Sig: Take 1 tablet (500 mg total) by mouth 3 (three) times daily.    Dispense:  21 tablet    Refill:  0      Return in about 4 months (around 05/18/2021) for CPE.      I, Lavon Paganini, MD, have reviewed all documentation for this visit. The documentation on 01/16/21 for the exam, diagnosis, procedures, and orders are all accurate and complete.   Aja Bolander, Dionne Bucy, MD, MPH [facility]

## 2021-01-16 NOTE — Patient Instructions (Signed)
Diverticulitis  Diverticulitis is infection or inflammation of small pouches (diverticula) in the colon that form due to a condition called diverticulosis. Diverticula can trap stool (feces) and bacteria, causing infection and inflammation. Diverticulitis may cause severe stomach pain and diarrhea. It may lead to tissue damage in the colon that causes bleeding or blockage. The diverticula may also burst (rupture) and cause infected stool to enter other areas of the abdomen. What are the causes? This condition is caused by stool becoming trapped in the diverticula, which allows bacteria to grow in the diverticula. This leads to inflammation and infection. What increases the risk? You are more likely to develop this condition if you have diverticulosis. The risk increases if you:  Are overweight or obese.  Do not get enough exercise.  Drink alcohol.  Use tobacco products.  Eat a diet that has a lot of red meat such as beef, pork, or lamb.  Eat a diet that does not include enough fiber. High-fiber foods include fruits, vegetables, beans, nuts, and whole grains.  Are over 40 years of age. What are the signs or symptoms? Symptoms of this condition may include:  Pain and tenderness in the abdomen. The pain is normally located on the left side of the abdomen, but it may occur in other areas.  Fever and chills.  Nausea.  Vomiting.  Cramping.  Bloating.  Changes in bowel routines.  Blood in your stool. How is this diagnosed? This condition is diagnosed based on:  Your medical history.  A physical exam.  Tests to make sure there is nothing else causing your condition. These tests may include: ? Blood tests. ? Urine tests. ? CT scan of the abdomen. How is this treated? Most cases of this condition are mild and can be treated at home. Treatment may include:  Taking over-the-counter pain medicines.  Following a clear liquid diet.  Taking antibiotic medicines by  mouth.  Resting. More severe cases may need to be treated at a hospital. Treatment may include:  Not eating or drinking.  Taking prescription pain medicine.  Receiving antibiotic medicines through an IV.  Receiving fluids and nutrition through an IV.  Surgery. When your condition is under control, your health care provider may recommend that you have a colonoscopy. This is an exam to look at the entire large intestine. During the exam, a lubricated, bendable tube is inserted into the anus and then passed into the rectum, colon, and other parts of the large intestine. A colonoscopy can show how severe your diverticula are and whether something else may be causing your symptoms. Follow these instructions at home: Medicines  Take over-the-counter and prescription medicines only as told by your health care provider. These include fiber supplements, probiotics, and stool softeners.  If you were prescribed an antibiotic medicine, take it as told by your health care provider. Do not stop taking the antibiotic even if you start to feel better.  Ask your health care provider if the medicine prescribed to you requires you to avoid driving or using machinery. Eating and drinking  Follow a full liquid diet or another diet as directed by your health care provider.  After your symptoms improve, your health care provider may tell you to change your diet. He or she may recommend that you eat a diet that contains at least 25 grams (25 g) of fiber daily. Fiber makes it easier to pass stool. Healthy sources of fiber include: ? Berries. One cup contains 4-8 grams of fiber. ? Beans   or lentils. One-half cup contains 5-8 grams of fiber. ? Green vegetables. One cup contains 4 grams of fiber.  Avoid eating red meat.   General instructions  Do not use any products that contain nicotine or tobacco, such as cigarettes, e-cigarettes, and chewing tobacco. If you need help quitting, ask your health care  provider.  Exercise for at least 30 minutes, 3 times each week. You should exercise hard enough to raise your heart rate and break a sweat.  Keep all follow-up visits as told by your health care provider. This is important. You may need to have a colonoscopy. Contact a health care provider if:  Your pain does not improve.  Your bowel movements do not return to normal. Get help right away if:  Your pain gets worse.  Your symptoms do not get better with treatment.  Your symptoms suddenly get worse.  You have a fever.  You vomit more than one time.  You have stools that are bloody, black, or tarry. Summary  Diverticulitis is infection or inflammation of small pouches (diverticula) in the colon that form due to a condition called diverticulosis. Diverticula can trap stool (feces) and bacteria, causing infection and inflammation.  You are at higher risk for this condition if you have diverticulosis and you eat a diet that does not include enough fiber.  Most cases of this condition are mild and can be treated at home. More severe cases may need to be treated at a hospital.  When your condition is under control, your health care provider may recommend that you have an exam called a colonoscopy. This exam can show how severe your diverticula are and whether something else may be causing your symptoms.  Keep all follow-up visits as told by your health care provider. This is important. This information is not intended to replace advice given to you by your health care provider. Make sure you discuss any questions you have with your health care provider. Document Revised: 07/26/2019 Document Reviewed: 07/26/2019 Elsevier Patient Education  2021 Elsevier Inc.  

## 2021-01-17 LAB — CULTURE, URINE COMPREHENSIVE

## 2021-01-17 NOTE — Telephone Encounter (Signed)
Patient seen yesterday and results were given and was treated.

## 2021-01-18 ENCOUNTER — Encounter: Payer: Self-pay | Admitting: Family Medicine

## 2021-01-18 DIAGNOSIS — R110 Nausea: Secondary | ICD-10-CM

## 2021-01-18 MED ORDER — ONDANSETRON HCL 4 MG PO TABS: 4.0000 mg | ORAL_TABLET | Freq: Three times a day (TID) | ORAL | 0 refills | Status: DC | PRN

## 2021-01-18 NOTE — Telephone Encounter (Signed)
Ok to send in Zofran 4mg TID prn #[id number]

## 2021-01-22 ENCOUNTER — Encounter: Payer: Self-pay | Admitting: Family Medicine

## 2021-01-25 ENCOUNTER — Encounter: Payer: Self-pay | Admitting: Gastroenterology

## 2021-01-25 ENCOUNTER — Other Ambulatory Visit: Payer: Self-pay

## 2021-01-25 ENCOUNTER — Ambulatory Visit: Payer: Managed Care, Other (non HMO) | Admitting: Gastroenterology

## 2021-01-25 VITALS — BP 123/82 | HR 74 | Ht 62.0 in | Wt 132.0 lb

## 2021-01-25 DIAGNOSIS — R1032 Left lower quadrant pain: Secondary | ICD-10-CM | POA: Diagnosis not present

## 2021-01-25 DIAGNOSIS — R197 Diarrhea, unspecified: Secondary | ICD-10-CM | POA: Diagnosis not present

## 2021-01-25 MED ORDER — DICYCLOMINE HCL 10 MG PO CAPS: 10.0000 mg | ORAL_CAPSULE | Freq: Three times a day (TID) | ORAL | 0 refills | Status: DC

## 2021-01-25 NOTE — Progress Notes (Signed)
Jonathon Bellows MD, MRCP(U.K) [address]  [place], [place] [address]  Main: [phone]  Fax: [phone]   Gastroenterology Consultation  Referring Provider:     Virginia Crews, MD Primary Care Physician:  Virginia Crews, MD Primary Gastroenterologist:  Dr. Jonathon Bellows  Reason for Consultation:    Diverticulitis        HPI:   Theresa Walton is a 55 y.o. y/o female referred for consultation & management  by Dr. Brita Romp, Dionne Bucy, MD  She was previously seen by myself in October 2018 for chronic constipation.  MiraLAX has not worked in the past.  She has had a prolapsed rectum, cystocele repair previously.  Constipationhas improved been going on for over 10 years.  I performed a colonoscopy in November 2018.  That showed no abnormalities.  Plan was to repeat a colonoscopy in 10 years. She was given [place] back then which had worked.    She was seen on 01/16/2021 by her doctor for increased urinary frequency with bloating gas and diarrhea and left lower quadrant pain.  She is given a course of ciprofloxacin and Flagyl.She seems to have had no issues with UTI but informed Dr Brita Romp, Dionne Bucy, MD . That she was still having a lot of problem with abdominal discomfort and spasms.  Since her last visit in 2018 her issues with constipation resolved.  She has been following a high-fiber diet.  2 weeks back started developing left lower quadrant pain with nonbloody diarrhea.  Symptoms of UTI resolved after treatment of antibiotics but left lower quadrant discomfort along with diarrhea continue.  She describes the discomfort as spasms localized usually after she eats.  She feels they are severe.  Past Medical History:  Diagnosis Date  . Basal cell carcinoma (BCC) of jawline 06/26/2018  . 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[place])   . Genital herpes   . Hypothyroidism   . Seasonal allergies   . Syncope and collapse   . Thyroid disease    hypothyroidism    Past  Surgical History:  Procedure Laterality Date  . ABDOMINAL HYSTERECTOMY    . ANTERIOR CRUCIATE LIGAMENT REPAIR Right 2012  . ARTHROSCOPIC REPAIR ACL  2013   Right knee  . AUGMENTATION MAMMAPLASTY Bilateral 2000   saline  . BLADDER REPAIR    . BREAST ENHANCEMENT SURGERY  1995  . COLONOSCOPY WITH PROPOFOL N/A 08/29/2017   Procedure: COLONOSCOPY WITH PROPOFOL;  Surgeon: Jonathon Bellows, MD;  Location: [facility] ENDOSCOPY;  Service: Gastroenterology;  Laterality: N/A;  . CYSTOCELE REPAIR  2013  . ENDOMETRIAL ABLATION  2013  . [place], 2001  . PARTIAL HYSTERECTOMY  2015  . PARTIAL HYSTERECTOMY Bilateral 2014  . RECTOPEXY  2011  . TONSILECTOMY, ADENOIDECTOMY, BILATERAL MYRINGOTOMY AND TUBES  1984  . TOOTH EXTRACTION      Prior to Admission medications   Medication Sig Start Date End Date Taking? Authorizing Provider  azelastine (ASTELIN) 0.1 % nasal spray Place into the nose. 10/20/19   [provider]  Azelastine-Fluticasone 137-50 MCG/ACT SUSP INSTILL 1 SPRAY INTO EACH NOSTRIL TWICE A DAY 07/08/18   [provider]  cetirizine (ZYRTEC) 5 MG tablet Take 5 mg by mouth daily.    [provider]  cyclobenzaprine (FLEXERIL) 5 MG tablet TAKE 1 TO 2 TABLETS BY MOUTH AT BEDTIME 05/29/20   Mar Daring, PA-C  estradiol (VIVELLE-DOT) 0.0375 MG/24HR Place 1 patch onto the skin 2 (two) times a week. 06/12/20  Donnamae Jude, MD  Eszopiclone 3 MG TABS Take 1 tablet (3 mg total) by mouth at bedtime. 12/18/20   Luiz Ochoa, NP  levocetirizine (XYZAL) 5    MG tablet 1 (one) time each day in the evening. 10/19/19   [provider]  levothyroxine (SYNTHROID) 25 MCG tablet Take 1 tablet (25 mcg total) by mouth daily before breakfast. 10/11/20   Burnette, Clearnce Sorrel, PA-C  metroNIDAZOLE (FLAGYL) 500 MG tablet Take 1 tablet (500 mg total) by mouth 3 (three) times daily. 01/16/21   Virginia Crews, MD  metroNIDAZOLE (METROCREAM) 0.75 % cream  metronidazole 0.75 % topical cream    [provider]  montelukast (SINGULAIR) 10 MG tablet Take 10 mg by mouth daily. 02/21/20   [provider]  ondansetron (ZOFRAN) 4 MG tablet Take 1 tablet (4 mg total) by mouth every 8 (eight) hours as needed for nausea or vomiting. 01/18/21   Brita Romp, Dionne Bucy, MD  sertraline (ZOLOFT) 50 MG tablet Take 1 tablet (50 mg total) by mouth at bedtime. 01/08/21   Mar Daring, PA-C  valACYclovir (VALTREX) 500 MG tablet TAKE 1 TABLET BY MOUTH TWICE DAILY 11/08/20   Mar Daring, PA-C    Family History  Problem Relation Age of Onset  . Pulmonary fibrosis Mother   . Healthy Sister   . Healthy Brother   . Healthy Sister   . Lung cancer Paternal Grandmother   . Arrhythmia Father   . Heart disease Father   . Prostate cancer Neg Hx   . Bladder Cancer Neg Hx   . Kidney cancer Neg Hx   . Breast cancer Neg Hx      Social History   Tobacco Use  . Smoking status: Former Smoker    Types: Cigarettes    Quit date: 05/15/2015    Years since quitting: 5.7  . Smokeless tobacco: Never Used  Vaping Use  . Vaping Use: Never used  Substance Use Topics  . Alcohol use: No    Alcohol/week: 0.0 standard drinks  . Drug use: No    Allergies as of 01/25/2021 - Review Complete 01/25/2021  Allergen Reaction Noted  . Cefaclor Anaphylaxis 04/19/2015  . Cephalosporins Anaphylaxis 04/19/2015  . Lubiprostone  09/19/2014  . Celecoxib    . Lac bovis  04/15/2014  . Apple Nausea And Vomiting 07/22/2014  . Pseudoephedrine Palpitations 04/14/2014    Review of Systems:    All systems reviewed and negative except where noted in HPI.   Physical Exam:  BP 123/82 (BP Location: Left Arm, Patient Position: Sitting, Cuff Size: Normal)   Pulse 74   Ht 5\' 2"  (1.575 m)   Wt 132 lb (59.9 kg)   BMI 24.14 kg/m  No LMP recorded. Patient has had a hysterectomy. Psych:  Alert and cooperative. Normal mood and affect. General:   Alert,  Well-developed,  well-nourished, pleasant and cooperative in NAD Head:  Normocephalic and atraumatic. Eyes:  Sclera clear, no icterus.   Conjunctiva pink. Ears:  Normal auditory acuity. Lungs:  Respirations even and unlabored.  Clear throughout to auscultation.   No wheezes, crackles, or rhonchi. No acute distress. Heart:  Regular rate and rhythm; no murmurs, clicks, rubs, or gallops. Abdomen:  Normal bowel sounds.  No bruits.  Soft, non-tender and non-distended without masses, hepatosplenomegaly or hernias noted.  No guarding or rebound tenderness.    Neurologic:  Alert and oriented x3;  grossly normal neurologically. Psych:  Alert and cooperative. Normal mood and affect.  Imaging Studies: No results found.  Assessment and Plan:   Anieya Helman is a 55 y.o. y/o female has been referred for left lower quadrant discomfort ongoing for over 2 weeks with nonbloody diarrhea.  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  Since the diarrhea has been going on for more than 2 weeks I will perform further evaluation.  Plan 1.  Stop all artificial sugars and sweeteners which she has 2.  Bentyl as needed 10 mg for spasms 3.  Check stool studies and if negative will proceed with CAT scan of her abdomen which we will place an order today  Follow up in 2 weeks video visit  Dr Jonathon Bellows MD,MRCP(U.K)

## 2021-01-29 LAB — GI PROFILE, STOOL, PCR

## 2021-01-29 LAB — C DIFFICILE TOXINS A+B W/RFLX: C difficile Toxins A+B, EIA: NEGATIVE

## 2021-01-29 LAB — C DIFFICILE, CYTOTOXIN B

## 2021-01-30 ENCOUNTER — Encounter: Payer: Self-pay | Admitting: Gastroenterology

## 2021-02-07 ENCOUNTER — Other Ambulatory Visit: Payer: Self-pay | Admitting: Physician Assistant

## 2021-02-07 DIAGNOSIS — A6004 Herpesviral vulvovaginitis: Secondary | ICD-10-CM

## 2021-02-08 ENCOUNTER — Other Ambulatory Visit: Payer: Self-pay

## 2021-02-08 ENCOUNTER — Ambulatory Visit
Admission: RE | Admit: 2021-02-08 | Discharge: 2021-02-08 | Disposition: A | Discharge status: Home / Self Care | Payer: Managed Care, Other (non HMO) | Source: Ambulatory Visit | Attending: Gastroenterology | Admitting: Gastroenterology

## 2021-02-08 DIAGNOSIS — R1032 Left lower quadrant pain: Secondary | ICD-10-CM | POA: Diagnosis present

## 2021-02-08 MED ORDER — IOHEXOL 300 MG/ML  SOLN
100.0000 mL | Freq: Once | INTRAMUSCULAR | Status: AC | PRN
  Administered 2021-02-08: 100 mL via INTRAVENOUS

## 2021-02-13 ENCOUNTER — Encounter (INDEPENDENT_AMBULATORY_CARE_PROVIDER_SITE_OTHER): Payer: Managed Care, Other (non HMO) | Admitting: Gastroenterology

## 2021-02-13 NOTE — Progress Notes (Signed)
Rescheduled

## 2021-02-14 ENCOUNTER — Telehealth: Payer: Self-pay

## 2021-02-14 NOTE — Telephone Encounter (Signed)
Called patient to reschedule video visit for next week. Pt agreed to visit. Will have jamie to schedule.

## 2021-02-15 ENCOUNTER — Other Ambulatory Visit: Payer: Self-pay | Admitting: Gastroenterology

## 2021-02-19 ENCOUNTER — Telehealth (INDEPENDENT_AMBULATORY_CARE_PROVIDER_SITE_OTHER): Payer: Managed Care, Other (non HMO) | Admitting: Gastroenterology

## 2021-02-19 ENCOUNTER — Encounter: Payer: Self-pay | Admitting: Gastroenterology

## 2021-02-19 DIAGNOSIS — R197 Diarrhea, unspecified: Secondary | ICD-10-CM

## 2021-02-19 NOTE — Progress Notes (Signed)
Theresa Walton , MD [address]  [place]  [place], [place] [address]  Main: [phone]  Fax: [phone]   Primary Care Physician: Virginia Crews, MD  Virtual Visit via Telephone Note  I connected with patient on 02/19/21 at  2:00 PM EDT by telephone and verified that I am speaking with the correct person using two identifiers.   I discussed the limitations, risks, security and privacy concerns of performing an evaluation and management service by telephone and the availability of in person appointments. I also discussed with the patient that there may be a patient responsible charge related to this service. The patient expressed understanding and agreed to proceed.  Location of Patient: Home Location of Provider: Home Persons involved: Patient and provider only   History of Present Illness: Chief Complaint  Patient presents with  . Follow-up    Follow up on LLQ abd pain     HPI: Theresa Walton is a 55 y.o. female    Summary of history :  Initially referred and seen on 01/25/2021.She was previously seen by myself in October 2018 for chronic constipation. MiraLAX has not worked in the past. She has had a prolapsed rectum, cystocele repair previously. Constipationhas improvedbeen going on for over 10 years. I performed a colonoscopy in November 2018. That showed no abnormalities. Plan was to repeat a colonoscopy in 10 years. She was given [place] back then which had worked.  In March 2022 she complained of bloating gas and diarrhea.  Treated with a course of ciprofloxacin and Flagyl which did not change her issues abdominal discomfort and spasms.  She denies any constipation at her initial visit.  She had been on a high-fiber diet.  Interval history 01/25/2021-4/25 /2022  01/25/2021: C. difficile toxin negative, GI stool PCR negative 02/10/2021: CT scan of the abdomen with contrast showed prominent stool throughout the visualized colon.  Nonobstructing  calculus in the lower pole of the left kidney which is stable.   Presently states she has no diarrhea, having regular bowel movements.  Consuming large quantity of fiber.  No other complaints. Current Outpatient Medications  Medication Sig Dispense Refill  . azelastine (ASTELIN) 0.1 % nasal spray Place into the nose.    . cetirizine (ZYRTEC) 5 MG tablet Take 5 mg by mouth daily.    . cyclobenzaprine (FLEXERIL) 5 MG tablet TAKE 1 TO 2 TABLETS BY MOUTH AT BEDTIME 30 tablet 5  . dicyclomine (BENTYL) 10 MG capsule Take 1 capsule (10 mg total) by mouth 4 (four) times daily -  before meals and at bedtime. 90 capsule 0  . estradiol (VIVELLE-DOT) 0.0375 MG/24HR Place 1 patch onto the skin 2 (two) times a week. 8 patch 12  . Eszopiclone 3 MG TABS Take 1 tablet (3 mg total) by mouth at bedtime. 30 tablet 1  . levocetirizine (XYZAL) 5 MG tablet 1 (one) time each day in the evening.    Marland Kitchen levothyroxine (SYNTHROID) 25 MCG tablet Take 1 tablet (25 mcg total) by mouth daily before breakfast. 90 tablet 1  . metroNIDAZOLE (FLAGYL) 500 MG tablet Take 1 tablet (500 mg total) by mouth 3 (three) times daily. 21 tablet 0  . metroNIDAZOLE (METROCREAM) 0.75 % cream metronidazole 0.75 % topical cream    . montelukast (SINGULAIR) 10 MG tablet Take 10 mg by mouth daily.    . ondansetron (ZOFRAN) 4 MG tablet Take 1 tablet (4 mg total) by mouth every 8 (eight) hours as needed for nausea or vomiting. [place]  tablet 0  . sertraline (ZOLOFT) 50 MG tablet Take 1 tablet (50 mg total) by mouth at bedtime. 90 tablet 1  . valACYclovir (VALTREX) 500 MG tablet TAKE 1 TABLET BY MOUTH TWICE DAILY 180 tablet 0  . Azelastine-Fluticasone 137-50 MCG/ACT SUSP INSTILL 1 SPRAY INTO EACH NOSTRIL TWICE A DAY     No current facility-administered medications for this visit.    Allergies as of 02/19/2021 - Review Complete 02/19/2021  Allergen Reaction Noted  . Cefaclor Anaphylaxis 04/19/2015  . Cephalosporins Anaphylaxis 04/19/2015  .  Lubiprostone  09/19/2014  . Celecoxib    . Lac bovis  04/15/2014  . Apple Nausea And Vomiting 07/22/2014  . Pseudoephedrine Palpitations 04/14/2014    Review of Systems:    All systems reviewed and negative except where noted in HPI.   Observations/Objective:  Labs: CMP     Component Value Date/Time   NA 141 04/28/2020 1132   NA 138 04/11/2014 1251   K 4.1 04/28/2020 1132   K 3.8 04/11/2014 1251   CL 100 04/28/2020 1132   CL 104 04/11/2014 1251   CO2 24 04/28/2020 1132   CO2 27 04/11/2014 1251   GLUCOSE 84 04/28/2020 1132   GLUCOSE 78 04/11/2014 1251   BUN 9 04/28/2020 1132   BUN 6 (L) 04/11/2014 1251   CREATININE 0.68 04/28/2020 1132   CREATININE 0.63 04/11/2014 1251   CALCIUM 9.6 04/28/2020 1132   CALCIUM 8.9 04/11/2014 1251   PROT 6.6 04/28/2020 1132   PROT 7.1 04/11/2014 1251   ALBUMIN 4.6 04/28/2020 1132   ALBUMIN 3.9 04/11/2014 1251   AST 16 04/28/2020 1132   AST 13 (L) 04/11/2014 1251   ALT 20 04/28/2020 1132   ALT 20 04/11/2014 1251   ALKPHOS 70 04/28/2020 1132   ALKPHOS 61 04/11/2014 1251   BILITOT 0.3 04/28/2020 1132   BILITOT 0.2 04/11/2014 1251   GFRNONAA 99 04/28/2020 1132   GFRNONAA >60 04/11/2014 1251   GFRAA 114 04/28/2020 1132   GFRAA >60 04/11/2014 1251   Lab Results  Component Value Date   WBC 4.9 04/28/2020   HGB 14.1 04/28/2020   HCT 42.3 04/28/2020   MCV 93 04/28/2020   PLT 281 04/28/2020    Imaging Studies: CT ABDOMEN W CONTRAST  Result Date: 02/10/2021 CLINICAL DATA:  Constant left lower quadrant abdominal pain with diarrhea for 3-4 weeks. Previous hysterectomy. History of rectal prolapse and cystocele repair. EXAM: CT ABDOMEN WITH CONTRAST TECHNIQUE: Multidetector CT imaging of the abdomen was performed using the standard protocol following bolus administration of intravenous contrast. CONTRAST:  154mL OMNIPAQUE IOHEXOL 300 MG/ML  SOLN COMPARISON:  Abdominopelvic CT 06/14/2016 FINDINGS: Lower chest: Clear lung bases. No significant  pleural or pericardial effusion. Hepatobiliary: The liver is normal in density without suspicious focal abnormality. No evidence of gallstones, gallbladder wall thickening or biliary dilatation. Pancreas: Unremarkable. No pancreatic ductal dilatation or surrounding inflammatory changes. Spleen: Normal in size without focal abnormality. Adrenals/Urinary Tract: Both adrenal glands appear normal. Probable tiny nonobstructing calculus in the lower pole of the left kidney. No evidence of proximal ureteral calculus or hydronephrosis. There is a stable small cyst in the upper pole of the left kidney. Stomach/Bowel: Enteric contrast was administered and has passed into the distal colon. The stomach appears unremarkable for its degree of distension. No evidence of bowel wall thickening, distention or surrounding inflammatory change. Prominent stool throughout the colon. The appendix is not visualized on this examination of the abdomen. Vascular/Lymphatic: There are no enlarged abdominal lymph nodes.  No significant vascular findings. Other: Bilateral breast implants are partially imaged. Intact abdominal wall. No ascites or free air. Musculoskeletal: No acute or significant osseous findings. IMPRESSION: 1. No acute abdominal findings. 2. Prominent stool throughout the visualized colon. Pelvis not imaged. 3. Probable tiny nonobstructing calculus in the lower pole of the left kidney, stable. No hydronephrosis. Electronically Signed   By: Richardean Sale M.D.   On: 02/10/2021 11:21    Assessment and Plan:   Theresa Walton is a 55 y.o. y/o female here to follow up  For acute  left lower quadrant discomfort  with nonbloody diarrhea.  CT scan of the abdomen showed no abnormality except constipation.  Stool studies were negative.  Very likely has chronic constipation leading to overflow diarrhea.  The constipation in turn could be related to pelvic floor dysfunction due to cystocele repair in the past.   Plan 1.  Discussed  in detail about regular evacuation of bowel contents.  Presently doing well.  Suggested to maintain a high-fiber diet.  If does not have a bowel movement in a few days the future to consider taking a laxative such as MiraLAX as needed or consider taking half a capful or 1 capful of MiraLAX on a daily basis to ensure that we prevent constipation to begin with.  If it fails can try agents such as Linzess or Trulance.  She presently has some bloating and gas, may be related to Johnson  production from constipation.  Suggested to give it a try with activated charcoal, avoid artificial sugars and sweeteners, clear out the colon with a dose of magnesium citrate.  If issues persist to give me a call back   The patient was advised to call back or seek an in-person evaluation if the symptoms worsen or if the condition fails to improve as anticipated.  I provided 15 minutes of non-face-to-face time during this encounter.  Dr Theresa Bellows MD,MRCP [facility]) Gastroenterology/Hepatology Pager: [phone]   Speech recognition software was used to dictate this note.

## 2021-02-22 ENCOUNTER — Other Ambulatory Visit: Payer: Self-pay

## 2021-02-22 ENCOUNTER — Ambulatory Visit (INDEPENDENT_AMBULATORY_CARE_PROVIDER_SITE_OTHER): Payer: Managed Care, Other (non HMO) | Admitting: Family Medicine

## 2021-02-22 ENCOUNTER — Encounter: Payer: Self-pay | Admitting: Family Medicine

## 2021-02-22 VITALS — BP 100/66 | HR 73 | Ht 62.0 in | Wt 131.0 lb

## 2021-02-22 DIAGNOSIS — N959 Unspecified menopausal and perimenopausal disorder: Secondary | ICD-10-CM

## 2021-02-22 DIAGNOSIS — Z01419 Encounter for gynecological examination (general) (routine) without abnormal findings: Secondary | ICD-10-CM | POA: Diagnosis not present

## 2021-02-22 MED ORDER — ESTRADIOL 0.0375 MG/24HR TD PTTW: 1.0000 | MEDICATED_PATCH | TRANSDERMAL | 12 refills | Status: DC

## 2021-02-22 NOTE — Progress Notes (Signed)
Pt c/o of being exhausted all the time, had Covid in Dec 2021

## 2021-02-22 NOTE — Progress Notes (Signed)
  Subjective:     Theresa Walton is a 55 y.o. female and is here for a comprehensive physical exam. The patient reports no problems. Vivelle dot is working well for her.     The following portions of the patient's history were reviewed and updated as appropriate: allergies, current medications, past family history, past medical history, past social history, past surgical history and problem list.  Review of Systems Pertinent items noted in HPI and remainder of comprehensive ROS otherwise negative.   Objective:    BP 100/66   Pulse 73   Ht 5\' 2"  (1.575 m)   Wt 131 lb (59.4 kg)   BMI 23.96 kg/m  General appearance: alert, cooperative and appears stated age Head: Normocephalic, without obvious abnormality, atraumatic Neck: no adenopathy, supple, symmetrical, trachea midline and thyroid not enlarged, symmetric, no tenderness/mass/nodules Lungs: clear to auscultation bilaterally Breasts: normal appearance, no masses or tenderness, bilateral implants Heart: regular rate and rhythm, S1, S2 normal, no murmur, click, rub or gallop Abdomen: soft, non-tender; bowel sounds normal; no masses,  no organomegaly Extremities: extremities normal, atraumatic, no cyanosis or edema Pulses: 2+ and symmetric Skin: Skin color, texture, turgor normal. No rashes or lesions Lymph nodes: Cervical, supraclavicular, and axillary nodes normal. Neurologic: Grossly normal    Assessment:    Healthy female exam.      Plan:   Problem List Items Addressed This Visit      Unprioritized   Menopausal and perimenopausal disorder    Continue Vivelle dot      Relevant Medications   estradiol (VIVELLE-DOT) 0.0375 MG/24HR    Other Visit Diagnoses    Encounter for gynecological examination without abnormal finding    -  Primary   Relevant Orders   MM 3D SCREEN BREAST W/IMPLANT BILATERAL     Return in 1 year (on 02/22/2022).    See After Visit Summary for Counseling Recommendations

## 2021-02-22 NOTE — Patient Instructions (Signed)
Preventive Care 84-57 Years Old, Female Preventive care refers to lifestyle choices and visits with your health care provider that can promote health and wellness. This includes:  A yearly physical exam. This is also called an annual wellness visit.  Regular dental and eye exams.  Immunizations.  Screening for certain conditions.  Healthy lifestyle choices, such as: ? Eating a healthy diet. ? Getting regular exercise. ? Not using drugs or products that contain nicotine and tobacco. ? Limiting alcohol use. What can I expect for my preventive care visit? Physical exam Your health care provider will check your:  Height and weight. These may be used to calculate your BMI (body mass index). BMI is a measurement that tells if you are at a healthy weight.  Heart rate and blood pressure.  Body temperature.  Skin for abnormal spots. Counseling Your health care provider may ask you questions about your:  Past medical problems.  Family's medical history.  Alcohol, tobacco, and drug use.  Emotional well-being.  Home life and relationship well-being.  Sexual activity.  Diet, exercise, and sleep habits.  Work and work Statistician.  Access to firearms.  Method of birth control.  Menstrual cycle.  Pregnancy history. What immunizations do I need? Vaccines are usually given at various ages, according to a schedule. Your health care provider will recommend vaccines for you based on your age, medical history, and lifestyle or other factors, such as travel or where you work.   What tests do I need? Blood tests  Lipid and cholesterol levels. These may be checked every 5 years, or more often if you are over 3 years old.  Hepatitis C test.  Hepatitis B test. Screening  Lung cancer screening. You may have this screening every year starting at age 73 if you have a 30-pack-year history of smoking and currently smoke or have quit within the past 15 years.  Colorectal cancer  screening. ? All adults should have this screening starting at age 52 and continuing until age 17. ? Your health care provider may recommend screening at age 49 if you are at increased risk. ? You will have tests every 1-10 years, depending on your results and the type of screening test.  Diabetes screening. ? This is done by checking your blood sugar (glucose) after you have not eaten for a while (fasting). ? You may have this done every 1-3 years.  Mammogram. ? This may be done every 1-2 years. ? Talk with your health care provider about when you should start having regular mammograms. This may depend on whether you have a family history of breast cancer.  BRCA-related cancer screening. This may be done if you have a family history of breast, ovarian, tubal, or peritoneal cancers.  Pelvic exam and Pap test. ? This may be done every 3 years starting at age 10. ? Starting at age 11, this may be done every 5 years if you have a Pap test in combination with an HPV test. Other tests  STD (sexually transmitted disease) testing, if you are at risk.  Bone density scan. This is done to screen for osteoporosis. You may have this scan if you are at high risk for osteoporosis. Talk with your health care provider about your test results, treatment options, and if necessary, the need for more tests. Follow these instructions at home: Eating and drinking  Eat a diet that includes fresh fruits and vegetables, whole grains, lean protein, and low-fat dairy products.  Take vitamin and mineral supplements  as recommended by your health care provider.  Do not drink alcohol if: ? Your health care provider tells you not to drink. ? You are pregnant, may be pregnant, or are planning to become pregnant.  If you drink alcohol: ? Limit how much you have to 0-1 drink a day. ? Be aware of how much alcohol is in your drink. In the [place] one drink equals one 12 oz bottle of beer (355 mL), one 5 oz glass of  wine (148 mL), or one 1 oz glass of hard liquor (44 mL).   Lifestyle  Take daily care of your teeth and gums. Brush your teeth every morning and night with fluoride toothpaste. Floss one time each day.  Stay active. Exercise for at least 30 minutes 5 or more days each week.  Do not use any products that contain nicotine or tobacco, such as cigarettes, e-cigarettes, and chewing tobacco. If you need help quitting, ask your health care provider.  Do not use drugs.  If you are sexually active, practice safe sex. Use a condom or other form of protection to prevent STIs (sexually transmitted infections).  If you do not wish to become pregnant, use a form of birth control. If you plan to become pregnant, see your health care provider for a prepregnancy visit.  If told by your health care provider, take low-dose aspirin daily starting at age 50.  Find healthy ways to cope with stress, such as: ? Meditation, yoga, or listening to music. ? Journaling. ? Talking to a trusted person. ? Spending time with friends and family. Safety  Always wear your seat belt while driving or riding in a vehicle.  Do not drive: ? If you have been drinking alcohol. Do not ride with someone who has been drinking. ? When you are tired or distracted. ? While texting.  Wear a helmet and other protective equipment during sports activities.  If you have firearms in your house, make sure you follow all gun safety procedures. What's next?  Visit your health care provider once a year for an annual wellness visit.  Ask your health care provider how often you should have your eyes and teeth checked.  Stay up to date on all vaccines. This information is not intended to replace advice given to you by your health care provider. Make sure you discuss any questions you have with your health care provider. Document Revised: 07/18/2020 Document Reviewed: 06/25/2018 Elsevier Patient Education  2021 [employer] Inc.  

## 2021-02-22 NOTE — Assessment & Plan Note (Signed)
Continue Vivelle dot

## 2021-02-26 NOTE — Telephone Encounter (Signed)
Patient doesn't think the medication Dr. Vicente Males advised her to purchase, is working much. Small BM but says system is not completely cleared. Please advise

## 2021-02-28 ENCOUNTER — Telehealth: Payer: Self-pay | Admitting: Gastroenterology

## 2021-02-28 NOTE — Telephone Encounter (Signed)
Patient LVM that she is waiting on a call back.

## 2021-02-28 NOTE — Telephone Encounter (Signed)
Informed patient of Dr. Georgeann Oppenheim recommendations regarding bowel movement issue. Per Dr. Vicente Males: Please give her samples of Linzess to 290 1 or 2 weeks for 1 to 2 weeks. Stop taking MiraLAX at the same time Samples were placed at the front desk for pick up. Pt verbalized understanding.

## 2021-03-01 ENCOUNTER — Other Ambulatory Visit: Payer: Self-pay | Admitting: Hospice and Palliative Medicine

## 2021-03-01 DIAGNOSIS — G4700 Insomnia, unspecified: Secondary | ICD-10-CM

## 2021-03-02 NOTE — Telephone Encounter (Signed)
Please review and fill if appropriate LOV:12/18/20 NOV: 06/28/21

## 2021-05-03 ENCOUNTER — Encounter: Payer: Self-pay | Admitting: Family Medicine

## 2021-05-03 ENCOUNTER — Ambulatory Visit (INDEPENDENT_AMBULATORY_CARE_PROVIDER_SITE_OTHER): Payer: Managed Care, Other (non HMO) | Admitting: Family Medicine

## 2021-05-03 ENCOUNTER — Other Ambulatory Visit: Payer: Self-pay

## 2021-05-03 VITALS — BP 97/72 | HR 71 | Resp 15 | Ht 62.0 in | Wt 131.0 lb

## 2021-05-03 DIAGNOSIS — Z0000 Encounter for general adult medical examination without abnormal findings: Secondary | ICD-10-CM | POA: Diagnosis not present

## 2021-05-03 DIAGNOSIS — R232 Flushing: Secondary | ICD-10-CM | POA: Diagnosis not present

## 2021-05-03 DIAGNOSIS — E039 Hypothyroidism, unspecified: Secondary | ICD-10-CM | POA: Diagnosis not present

## 2021-05-03 DIAGNOSIS — E781 Pure hyperglyceridemia: Secondary | ICD-10-CM | POA: Diagnosis not present

## 2021-05-03 NOTE — Patient Instructions (Signed)
Health Maintenance, Female Adopting a healthy lifestyle and getting preventive care are important in promoting health and wellness. Ask your health care provider about: The right schedule for you to have regular tests and exams. Things you can do on your own to prevent diseases and keep yourself healthy. What should I know about diet, weight, and exercise? Eat a healthy diet  Eat a diet that includes plenty of vegetables, fruits, low-fat dairy products, and lean protein. Do not eat a lot of foods that are high in solid fats, added sugars, or sodium.  Maintain a healthy weight Body mass index (BMI) is used to identify weight problems. It estimates body fat based on height and weight. Your health care provider can help determineyour BMI and help you achieve or maintain a healthy weight. Get regular exercise Get regular exercise. This is one of the most important things you can do for your health. Most adults should: Exercise for at least 150 minutes each week. The exercise should increase your heart rate and make you sweat (moderate-intensity exercise). Do strengthening exercises at least twice a week. This is in addition to the moderate-intensity exercise. Spend less time sitting. Even light physical activity can be beneficial. Watch cholesterol and blood lipids Have your blood tested for lipids and cholesterol at 57 years of age, then havethis test every 5 years. Have your cholesterol levels checked more often if: Your lipid or cholesterol levels are high. You are older than 57 years of age. You are at high risk for heart disease. What should I know about cancer screening? Depending on your health history and family history, you may need to have cancer screening at various ages. This may include screening for: Breast cancer. Cervical cancer. Colorectal cancer. Skin cancer. Lung cancer. What should I know about heart disease, diabetes, and high blood pressure? Blood pressure and heart  disease High blood pressure causes heart disease and increases the risk of stroke. This is more likely to develop in people who have high blood pressure readings, are of African descent, or are overweight. Have your blood pressure checked: Every 3-5 years if you are 18-39 years of age. Every year if you are 40 years old or older. Diabetes Have regular diabetes screenings. This checks your fasting blood sugar level. Have the screening done: Once every three years after age 40 if you are at a normal weight and have a low risk for diabetes. More often and at a younger age if you are overweight or have a high risk for diabetes. What should I know about preventing infection? Hepatitis B If you have a higher risk for hepatitis B, you should be screened for this virus. Talk with your health care provider to find out if you are at risk forhepatitis B infection. Hepatitis C Testing is recommended for: Everyone born from 1945 through 1965. Anyone with known risk factors for hepatitis C. Sexually transmitted infections (STIs) Get screened for STIs, including gonorrhea and chlamydia, if: You are sexually active and are younger than 57 years of age. You are older than 57 years of age and your health care provider tells you that you are at risk for this type of infection. Your sexual activity has changed since you were last screened, and you are at increased risk for chlamydia or gonorrhea. Ask your health care provider if you are at risk. Ask your health care provider about whether you are at high risk for HIV. Your health care provider may recommend a prescription medicine to help   prevent HIV infection. If you choose to take medicine to prevent HIV, you should first get tested for HIV. You should then be tested every 3 months for as long as you are taking the medicine. Pregnancy If you are about to stop having your period (premenopausal) and you may become pregnant, seek counseling before you get  pregnant. Take 400 to 800 micrograms (mcg) of folic acid every day if you become pregnant. Ask for birth control (contraception) if you want to prevent pregnancy. Osteoporosis and menopause Osteoporosis is a disease in which the bones lose minerals and strength with aging. This can result in bone fractures. If you are 67 years old or older, or if you are at risk for osteoporosis and fractures, ask your health care provider if you should: Be screened for bone loss. Take a calcium or vitamin D supplement to lower your risk of fractures. Be given hormone replacement therapy (HRT) to treat symptoms of menopause. Follow these instructions at home: Lifestyle Do not use any products that contain nicotine or tobacco, such as cigarettes, e-cigarettes, and chewing tobacco. If you need help quitting, ask your health care provider. Do not use street drugs. Do not share needles. Ask your health care provider for help if you need support or information about quitting drugs. Alcohol use Do not drink alcohol if: Your health care provider tells you not to drink. You are pregnant, may be pregnant, or are planning to become pregnant. If you drink alcohol: Limit how much you use to 0-1 drink a day. Limit intake if you are breastfeeding. Be aware of how much alcohol is in your drink. In the [place] one drink equals one 12 oz bottle of beer (355 mL), one 5 oz glass of wine (148 mL), or one 1 oz glass of hard liquor (44 mL). General instructions Schedule regular health, dental, and eye exams. Stay current with your vaccines. Tell your health care provider if: You often feel depressed. You have ever been abused or do not feel safe at home. Summary Adopting a healthy lifestyle and getting preventive care are important in promoting health and wellness. Follow your health care provider's instructions about healthy diet, exercising, and getting tested or screened for diseases. Follow your health care provider's  instructions on monitoring your cholesterol and blood pressure. This information is not intended to replace advice given to you by your health care provider. Make sure you discuss any questions you have with your healthcare provider. Document Revised: 10/07/2018 Document Reviewed: 10/07/2018 Elsevier Patient Education  2022 [place] Breast self-awareness is knowing how your breasts look and feel. Doing breast self-awareness is important. It allows you to catch a breast problem early while it is still small and can be treated. All women should do breast self-awareness, including women who have had breast implants. Tell your doctorif you notice a change in your breasts. What you need: A mirror. A well-lit room. How to do a breast self-exam A breast self-exam is one way to learn what is normal for your breasts and tocheck for changes. To do a breast self-exam: Look for changes  Take off all the clothes above your waist. Stand in front of a mirror in a room with good lighting. Put your hands on your hips. Push your hands down. Look at your breasts and nipples in the mirror to see if one breast or nipple looks different from the other. Check to see if: The shape of one breast is different. The size of  one breast is different. There are wrinkles, dips, and bumps in one breast and not the other. Look at each breast for changes in the skin, such as: Redness. Scaly areas. Look for changes in your nipples, such as: Liquid around the nipples. Bleeding. Dimpling. Redness. A change in where the nipples are.  Feel for changes  Lie on your back on the floor. Feel each breast. To do this, follow these steps: Pick a breast to feel. Put the arm closest to that breast above your head. Use your other arm to feel the nipple area of your breast. Feel the area with the pads of your three middle fingers by making small circles with your fingers. For the first circle, press  lightly. For the second circle, press harder. For the third circle, press even harder. Keep making circles with your fingers at the different pressures as you move down your breast. Stop when you feel your ribs. Move your fingers a little toward the center of your body. Start making circles with your fingers again, this time going up until you reach your collarbone. Keep making up-and-down circles until you reach your armpit. Remember to keep using the three pressures. Feel the other breast in the same way. Sit or stand in the tub or shower. With soapy water on your skin, feel each breast the same way you did in step 2 when you were lying on the floor.  Write down what you find Writing down what you find can help you remember what to tell your doctor. Write down: What is normal for each breast. Any changes you find in each breast, including: The kind of changes you find. Whether you have pain. Size and location of any lumps. When you last had your menstrual period. General tips Check your breasts every month. If you are breastfeeding, the best time to check your breasts is after you feed your baby or after you use a breast pump. If you get menstrual periods, the best time to check your breasts is 5-7 days after your menstrual period is over. With time, you will become comfortable with the self-exam, and you will begin to know if there are changes in your breasts. Contact a doctor if you: See a change in the shape or size of your breasts or nipples. See a change in the skin of your breast or nipples, such as red or scaly skin. Have fluid coming from your nipples that is not normal. Find a lump or thick area that was not there before. Have pain in your breasts. Have any concerns about your breast health. Summary Breast self-awareness includes looking for changes in your breasts, as well as feeling for changes within your breasts. Breast self-awareness should be done in front of a mirror  in a well-lit room. You should check your breasts every month. If you get menstrual periods, the best time to check your breasts is 5-7 days after your menstrual period is over. Let your doctor know of any changes you see in your breasts, including changes in size, changes on the skin, pain or tenderness, or fluid from your nipples that is not normal. This information is not intended to replace advice given to you by your health care provider. Make sure you discuss any questions you have with your healthcare provider. Document Revised: 06/02/2018 Document Reviewed: 06/02/2018 Elsevier Patient Education  [place].

## 2021-05-03 NOTE — Progress Notes (Signed)
Complete physical exam   Patient: Theresa Walton   DOB: 08-Jan-1966   55 y.o. Female  MRN: [id number] Visit Date: 05/03/2021  Today's healthcare provider: Vernie Murders, PA-C   Chief Complaint  Patient presents with   Annual Exam   Subjective     HPI  Theresa Walton is a 55 y.o. female who presents today for a complete physical exam.  She reports consuming a general diet. Patient reports that she stays active by walking 2x a week She generally feels fairly well. She reports sleeping well. She does not have additional problems to discuss today.   Mammogram ordered 02/22/21 by Ob/Gyn Colonoscopy- 08/29/2017 Pap - Hx of hysterectomy   Past Medical History:  Diagnosis Date   Basal cell carcinoma (BCC) of jawline 06/26/2018   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HCC)    Genital herpes    Hypothyroidism    Seasonal allergies    Syncope and collapse    Thyroid disease    hypothyroidism   Past Surgical History:  Procedure Laterality Date   ABDOMINAL HYSTERECTOMY     ANTERIOR CRUCIATE LIGAMENT REPAIR Right 2012   ARTHROSCOPIC REPAIR ACL  2013   Right knee   AUGMENTATION MAMMAPLASTY Bilateral 2000   saline   BLADDER REPAIR     BREAST ENHANCEMENT SURGERY  1995   COLONOSCOPY WITH PROPOFOL N/A 08/29/2017   Procedure: COLONOSCOPY WITH PROPOFOL;  Surgeon: Jonathon Bellows, MD;  [facility] ENDOSCOPY;  Service: Gastroenterology;  Laterality: N/A;   CYSTOCELE REPAIR  2013   ENDOMETRIAL ABLATION  2013   NASAL SEPTUM SURGERY  1987, 1991, 2001   PARTIAL HYSTERECTOMY  2015   PARTIAL HYSTERECTOMY Bilateral 2014   RECTOPEXY  2011   TONSILECTOMY, ADENOIDECTOMY, BILATERAL MYRINGOTOMY AND TUBES  1984   TOOTH EXTRACTION     Social History   Socioeconomic History   Marital status: Divorced    Spouse name: Not on file   Number of children: Not on file   Years of education: Not on file   Highest education level: Not on file  Occupational History   Not on file  Tobacco Use   Smoking  status: Former    Pack years: 0.00    Types: Cigarettes    Quit date: 05/15/2015    Years since quitting: 5.9   Smokeless tobacco: Never  Vaping Use   Vaping Use: Never used  Substance and Sexual Activity   Alcohol use: No    Alcohol/week: 0.0 standard drinks   Drug use: No   Sexual activity: Never    Partners: Male    Birth control/protection: Surgical  Other Topics Concern   Not on file  Social History Narrative   Not on file   Social Determinants of Health   Financial Resource Strain: Not on file  Food Insecurity: Not on file  Transportation Needs: Not on file  Physical Activity: Not on file  Stress: Not on file  Social Connections: Not on file  Intimate Partner Violence: Not on file   Family Status  Relation Name Status   Mother  Deceased at age 57-01-18   Sister 1 Alive   Brother 1 Alive   Sister 2 Alive   PGM  (Not Specified)   Father  Alive   Neg Hx  (Not Specified)   Family History  Problem Relation Age of Onset   Pulmonary fibrosis Mother    Healthy Sister    Healthy Brother    Healthy Sister    Lung cancer Paternal  Grandmother    Arrhythmia Father    Heart disease Father    Prostate cancer Neg Hx    Bladder Cancer Neg Hx    Kidney cancer Neg Hx    Breast cancer Neg Hx    Allergies  Allergen Reactions   Cefaclor Anaphylaxis   Cephalosporins Anaphylaxis   Lubiprostone     Other reaction(s): Angioedema Tongue Swelling x 2 times.  Other reaction(s): Angioedema Tongue Swelling x 2 times.    Celecoxib    Lac Bovis     Other reaction(s): Other (See Comments) GI Upset to milk protein   Apple Nausea And Vomiting   Pseudoephedrine Palpitations    Tachycardia    Patient Care Team: Virginia Crews, MD as PCP - General (Family Medicine)   Medications: Outpatient Medications Prior to Visit  Medication Sig   azelastine (ASTELIN) 0.1 % nasal spray Place into the nose.   cetirizine (ZYRTEC) 5 MG tablet Take 5 mg by mouth daily.    cyclobenzaprine (FLEXERIL) 5 MG tablet TAKE 1 TO 2 TABLETS BY MOUTH AT BEDTIME   dicyclomine (BENTYL) 10 MG capsule Take 1 capsule (10 mg total) by mouth 4 (four) times daily -  before meals and at bedtime.   estradiol (VIVELLE-DOT) 0.0375 MG/24HR Place 1 patch onto the skin 2 (two) times a week.   Eszopiclone 3 MG TABS TAKE 1 TABLET(3 MG) BY MOUTH AT BEDTIME   levocetirizine (XYZAL) 5 MG tablet 1 (one) time each day in the evening.   levothyroxine (SYNTHROID) 25 MCG tablet Take 1 tablet (25 mcg total) by mouth daily before breakfast.   sertraline (ZOLOFT) 50 MG tablet Take 1 tablet (50 mg total) by mouth at bedtime.   valACYclovir (VALTREX) 500 MG tablet TAKE 1 TABLET BY MOUTH TWICE DAILY (Patient not taking: Reported on 05/03/2021)   [DISCONTINUED] metroNIDAZOLE (FLAGYL) 500 MG tablet Take 1 tablet (500 mg total) by mouth 3 (three) times daily.   [DISCONTINUED] metroNIDAZOLE (METROCREAM) 0.75 % cream metronidazole 0.75 % topical cream   [DISCONTINUED] montelukast (SINGULAIR) 10 MG tablet Take 10 mg by mouth daily.   [DISCONTINUED] ondansetron (ZOFRAN) 4 MG tablet Take 1 tablet (4 mg total) by mouth every 8 (eight) hours as needed for nausea or vomiting.   No facility-administered medications prior to visit.    Review of Systems  HENT:  Positive for congestion, postnasal drip and sinus pressure.   Eyes:  Positive for itching.  Gastrointestinal:  Positive for abdominal distention and constipation.  Allergic/Immunologic: Positive for environmental allergies and food allergies.  Psychiatric/Behavioral:  Positive for decreased concentration.      Objective    BP 97/72   Pulse 71   Resp 15   Ht 5\' 2"  (1.575 m)   Wt 131 lb (59.4 kg)   SpO2 100%   BMI 23.96 kg/m    Physical Exam Constitutional:      Appearance: She is well-developed.  HENT:     Head: Normocephalic and atraumatic.     Right Ear: External ear normal.     Left Ear: External ear normal.     Nose: Nose normal.  Eyes:      General:        Right eye: No discharge.     Conjunctiva/sclera: Conjunctivae normal.     Pupils: Pupils are equal, round, and reactive to light.  Neck:     Thyroid: No thyromegaly.     Trachea: No tracheal deviation.  Cardiovascular:     Rate and Rhythm: Normal  rate and regular rhythm.     Heart sounds: Normal heart sounds. No murmur heard. Pulmonary:     Effort: Pulmonary effort is normal. No respiratory distress.     Breath sounds: Normal breath sounds. No wheezing or rales.  Chest:     Chest wall: No tenderness.  Abdominal:     General: There is no distension.     Palpations: Abdomen is soft. There is no mass.     Tenderness: There is no abdominal tenderness. There is no guarding or rebound.  Genitourinary:    Comments: History of hysterectomy. Deferred exam. Musculoskeletal:        General: No tenderness. Normal range of motion.     Cervical back: Normal range of motion and neck supple.  Lymphadenopathy:     Cervical: No cervical adenopathy.  Skin:    General: Skin is warm and dry.     Findings: No erythema or rash.  Neurological:     Mental Status: She is alert and oriented to person, place, and time.     Cranial Nerves: No cranial nerve deficit.     Motor: No abnormal muscle tone.     Coordination: Coordination normal.     Deep Tendon Reflexes: Reflexes are normal and symmetric. Reflexes normal.  Psychiatric:        Behavior: Behavior normal.        Thought Content: Thought content normal.        Judgment: Judgment normal.      Last depression screening scores PHQ 2/9 Scores 05/03/2021 04/28/2020 04/21/2019  PHQ - 2 Score 0 1 0  PHQ- 9 Score 4 3 2    Last fall risk screening Fall Risk  05/03/2021  Falls in the past year? 0  Number falls in past yr: 0  Injury with Fall? 0  Risk for fall due to : -  Follow up -   Last Audit-C alcohol use screening Alcohol Use Disorder Test (AUDIT) 05/03/2021  1. How often do you have a drink containing alcohol? 0  2. How many  drinks containing alcohol do you have on a typical day when you are drinking? 0  3. How often do you have six or more drinks on one occasion? 0  AUDIT-C Score 0  Alcohol Brief Interventions/Follow-up -   A score of 3 or more in women, and 4 or more in men indicates increased risk for alcohol abuse, EXCEPT if all of the points are from question 1   No results found for any visits on 05/03/21.  Assessment & Plan    Routine Health Maintenance and Physical Exam  Exercise Activities and Dietary recommendations  Goals   Continue to walk 30 minutes 3-4 days a week and regular diet as allowed.     Immunization History  Administered Date(s) Administered   Influenza Inj Mdck Quad Pf 07/29/2019   Influenza,inj,Quad PF,6+ Mos 08/02/2014, 08/12/2016, 08/19/2018, 06/26/2020   Influenza,inj,quad, With Preservative 08/24/2016   Influenza-Unspecified 08/29/2015, 08/20/2017   PFIZER(Purple Top)SARS-COV-2 Vaccination 01/14/2020, 02/11/2020, 10/06/2020   Pneumococcal Polysaccharide-23 07/30/2019   Tdap 08/12/2016   Zoster Recombinat (Shingrix) 06/26/2020    Health Maintenance  Topic Date Due   Pneumococcal Vaccine 26-4 Years old (1 - PCV) Never done   Zoster Vaccines- Shingrix (2 of 2) 08/21/2020   COVID-19 Vaccine (4 - Booster for Pfizer series) 01/04/2021   INFLUENZA VACCINE  05/28/2021   MAMMOGRAM  06/23/2021   TETANUS/TDAP  08/12/2026   COLONOSCOPY (Pts 45-88yrs Insurance coverage will need to be  confirmed)  08/30/2027   Hepatitis C Screening  Completed   HIV Screening  Completed   HPV VACCINES  Aged Out    Discussed health benefits of physical activity, and encouraged her to engage in regular exercise appropriate for her age and condition.  1. Annual physical exam Good general health. Some "brain fog" (occasional difficulty with concentration since having COVID December 2021. Recheck routine labs and follow pending reports. - CBC with Differential/Platelet - Comprehensive metabolic  panel - Lipid panel - TSH  2. Adult hypothyroidism Tolerating Levothyroxine 25 mcg qd without side effects. TSH was normal on 04-28-20. Will recheck labs. - CBC with Differential/Platelet - Comprehensive metabolic panel - TSH - T4 - T3  3. Hot flashes Presently controlled by Vivelle-Dot twice a week. Recheck labs for other metabolic disorders versus just menopause. - CBC with Differential/Platelet - Comprehensive metabolic panel - TSH  4. Hypertriglyceridemia Elevated triglycerides in 2018 but total cholesterol and LDL high in 2021. Recommend she maintain regular exercise level and low fat diet. Recheck labs (may need statin). - Comprehensive metabolic panel - Lipid panel   No follow-ups on file.     I, Sri Clegg, PA-C, have reviewed all documentation for this visit. The documentation on 05/03/21 for the exam, diagnosis, procedures, and orders are all accurate and complete.    Vernie Murders, PA-C  [employer] [phone] (phone) [phone] (fax)  [place]

## 2021-05-04 LAB — T4: T4, Total: 7.3 ug/dL (ref 4.5–12.0)

## 2021-05-04 LAB — COMPREHENSIVE METABOLIC PANEL
ALT: 16 IU/L (ref 0–32)
AST: 18 IU/L (ref 0–40)
Albumin/Globulin Ratio: 1.9 (ref 1.2–2.2)
Albumin: 4.7 g/dL (ref 3.8–4.9)
Alkaline Phosphatase: 66 IU/L (ref 44–121)
BUN/Creatinine Ratio: 13 (ref 9–23)
BUN: 14 mg/dL (ref 6–24)
Bilirubin Total: 0.4 mg/dL (ref 0.0–1.2)
CO2: 25 mmol/L (ref 20–29)
Calcium: 9.9 mg/dL (ref 8.7–10.2)
Chloride: 97 mmol/L (ref 96–106)
Creatinine, Ser: 1.05 mg/dL — ABNORMAL HIGH (ref 0.57–1.00)
Globulin, Total: 2.5 g/dL (ref 1.5–4.5)
Glucose: 84 mg/dL (ref 65–99)
Potassium: 4 mmol/L (ref 3.5–5.2)
Sodium: 137 mmol/L (ref 134–144)
Total Protein: 7.2 g/dL (ref 6.0–8.5)
eGFR: 62 mL/min/{1.73_m2} (ref >59)

## 2021-05-04 LAB — TSH: TSH: 1.76 u[IU]/mL (ref 0.450–4.500)

## 2021-05-04 LAB — CBC WITH DIFFERENTIAL/PLATELET
Basophils Absolute: 0 10*3/uL (ref 0.0–0.2)
Basos: 1 %
EOS (ABSOLUTE): 0.1 10*3/uL (ref 0.0–0.4)
Eos: 1 %
Hematocrit: 42.3 % (ref 34.0–46.6)
Hemoglobin: 13.8 g/dL (ref 11.1–15.9)
Immature Grans (Abs): 0 10*3/uL (ref 0.0–0.1)
Immature Granulocytes: 0 %
Lymphocytes Absolute: 3.2 10*3/uL — ABNORMAL HIGH (ref 0.7–3.1)
Lymphs: 46 %
MCH: 30.4 pg (ref 26.6–33.0)
MCHC: 32.6 g/dL (ref 31.5–35.7)
MCV: 93 fL (ref 79–97)
Monocytes Absolute: 0.6 10*3/uL (ref 0.1–0.9)
Monocytes: 8 %
Neutrophils Absolute: 2.9 10*3/uL (ref 1.4–7.0)
Neutrophils: 44 %
Platelets: 383 10*3/uL (ref 150–450)
RBC: 4.54 x10E6/uL (ref 3.77–5.28)
RDW: 12.5 % (ref 11.7–15.4)
WBC: 6.8 10*3/uL (ref 3.4–10.8)

## 2021-05-04 LAB — LIPID PANEL
Chol/HDL Ratio: 3.4 ratio (ref 0.0–4.4)
Cholesterol, Total: 212 mg/dL — ABNORMAL HIGH (ref 100–199)
HDL: 62 mg/dL (ref >39)
LDL Chol Calc (NIH): 131 mg/dL — ABNORMAL HIGH (ref 0–99)
Triglycerides: 110 mg/dL (ref 0–149)
VLDL Cholesterol Cal: 19 mg/dL (ref 5–40)

## 2021-05-04 LAB — T3: T3, Total: 73 ng/dL (ref 71–180)

## 2021-05-24 ENCOUNTER — Encounter: Payer: Managed Care, Other (non HMO) | Admitting: Family Medicine

## 2021-06-13 ENCOUNTER — Other Ambulatory Visit: Payer: Self-pay | Admitting: Family Medicine

## 2021-06-13 ENCOUNTER — Ambulatory Visit: Payer: Managed Care, Other (non HMO) | Admitting: Family Medicine

## 2021-06-13 ENCOUNTER — Other Ambulatory Visit: Payer: Self-pay

## 2021-06-13 ENCOUNTER — Encounter: Payer: Self-pay | Admitting: Family Medicine

## 2021-06-13 VITALS — BP 107/75 | HR 73 | Temp 97.9°F | Resp 16 | Wt 129.0 lb

## 2021-06-13 DIAGNOSIS — R5383 Other fatigue: Secondary | ICD-10-CM

## 2021-06-13 DIAGNOSIS — E538 Deficiency of other specified B group vitamins: Secondary | ICD-10-CM | POA: Diagnosis not present

## 2021-06-13 DIAGNOSIS — Z90710 Acquired absence of both cervix and uterus: Secondary | ICD-10-CM | POA: Diagnosis not present

## 2021-06-13 DIAGNOSIS — E559 Vitamin D deficiency, unspecified: Secondary | ICD-10-CM

## 2021-06-13 DIAGNOSIS — N959 Unspecified menopausal and perimenopausal disorder: Secondary | ICD-10-CM

## 2021-06-13 DIAGNOSIS — T50B95A Adverse effect of other viral vaccines, initial encounter: Secondary | ICD-10-CM

## 2021-06-13 MED ORDER — PREDNISONE 5 MG (21) PO TBPK: ORAL_TABLET | ORAL | 0 refills | Status: DC

## 2021-06-13 NOTE — Progress Notes (Signed)
Established patient visit   Patient: Theresa Walton   DOB: [birth year]   55 y.o. Female  MRN: [id number] Visit Date: 06/13/2021  Today's healthcare provider: Gwyneth Sprout, FNP   Chief Complaint  Patient presents with   URI    Patient reports that on 05/28/21 she had injection to her uvula by her ENT, patient states that she was seen at [facility] 6 days ago and covid screening was negative.   Subjective  -------------------------------------------------------------------------------------------------------------------- URI   HPI     URI   Associated symptoms inlclude ear pain, headache and swollen glands.  Recent episode started 1 to 4 weeks ago.  The problem has been unchanged since onset.  The temperature has been with in normal range.  Patient  is drinking plenty of fluids.  Patient is not a smoker. Additional comments: Patient reports that on 05/28/21 she had injection to her uvula by her ENT, patient states that she was seen at [facility] 6 days ago and covid screening was negative.      Last edited by Minette Headland, CMA on 06/13/2021 10:49 AM.           Medications: Outpatient Medications Prior to Visit  Medication Sig   cetirizine (ZYRTEC) 5 MG tablet Take 5 mg by mouth daily.   levothyroxine (SYNTHROID) 25 MCG tablet Take 1 tablet (25 mcg total) by mouth daily before breakfast.   azelastine (ASTELIN) 0.1 % nasal spray Place into the nose. (Patient not taking: Reported on 06/13/2021)   cyclobenzaprine (FLEXERIL) 5 MG tablet TAKE 1 TO 2 TABLETS BY MOUTH AT BEDTIME (Patient not taking: Reported on 06/13/2021)   dicyclomine (BENTYL) 10 MG capsule Take 1 capsule (10 mg total) by mouth 4 (four) times daily -  before meals and at bedtime. (Patient not taking: Reported on 06/13/2021)   estradiol (VIVELLE-DOT) 0.0375 MG/24HR Place 1 patch onto the skin 2 (two) times a week. (Patient not taking: Reported on 06/13/2021)   Eszopiclone 3 MG TABS TAKE 1  TABLET(3 MG) BY MOUTH AT BEDTIME (Patient not taking: Reported on 06/13/2021)   levocetirizine (XYZAL) 5 MG tablet 1 (one) time each day in the evening. (Patient not taking: Reported on 06/13/2021)   sertraline (ZOLOFT) 50 MG tablet Take 1 tablet (50 mg total) by mouth at bedtime. (Patient not taking: Reported on 06/13/2021)   valACYclovir (VALTREX) 500 MG tablet TAKE 1 TABLET BY MOUTH TWICE DAILY (Patient not taking: No sig reported)   No facility-administered medications prior to visit.    Review of Systems  Constitutional:  Positive for chills and fatigue.  HENT:  Positive for tinnitus.   Eyes: Negative.   Respiratory:  Positive for chest tightness.   Cardiovascular: Negative.   Gastrointestinal: Negative.       Objective  -------------------------------------------------------------------------------------------------------------------- BP 107/75   Pulse 73   Temp 97.9 F (36.6 C) (Oral)   Resp 16   Wt 129 lb (58.5 kg)   SpO2 100%   BMI 23.59 kg/m     Physical Exam Vitals and nursing note reviewed.  Constitutional:      General: She is awake. She is not in acute distress.    Appearance: Normal appearance. She is well-developed, well-groomed and normal weight. She is not ill-appearing, toxic-appearing or diaphoretic.  HENT:     Head: Normocephalic and atraumatic.     Jaw: No tenderness.     Salivary Glands: Right salivary gland is not diffusely enlarged or tender. Left  salivary gland is not diffusely enlarged or tender.      Comments: R upper hair line bug bite- appears benign    Right Ear: Hearing, tympanic membrane, ear canal and external ear normal. There is no impacted cerumen.     Left Ear: Hearing, tympanic membrane, ear canal and external ear normal. There is no impacted cerumen.     Nose: Nose normal. No congestion or rhinorrhea.     Right Turbinates: Enlarged, swollen and pale.     Left Turbinates: Enlarged, swollen and pale.     Right Sinus: No maxillary sinus  tenderness or frontal sinus tenderness.     Left Sinus: No maxillary sinus tenderness or frontal sinus tenderness.     Mouth/Throat:     Lips: Pink.     Mouth: Mucous membranes are moist. No injury, lacerations, oral lesions or angioedema.     Tongue: No lesions. Tongue does not deviate from midline.     Pharynx: Uvula swelling present. No pharyngeal swelling, oropharyngeal exudate or posterior oropharyngeal erythema.     Tonsils: No tonsillar exudate or tonsillar abscesses.     Comments: Chronic condition; per pt report Recent injection into uvula  Eyes:     General: Lids are normal. No allergic shiner, visual field deficit or scleral icterus.       Right eye: No discharge.        Left eye: No discharge.     Extraocular Movements: Extraocular movements intact.     Conjunctiva/sclera: Conjunctivae normal.     Pupils: Pupils are equal, round, and reactive to light.  Neck:     Thyroid: No thyroid mass, thyromegaly or thyroid tenderness.     Vascular: Normal carotid pulses. No carotid bruit or hepatojugular reflux.     Trachea: Trachea normal.  Cardiovascular:     Rate and Rhythm: Normal rate and regular rhythm.     Pulses: Normal pulses.          Radial pulses are 2+ on the right side and 2+ on the left side.       Dorsalis pedis pulses are 2+ on the right side and 2+ on the left side.       Posterior tibial pulses are 2+ on the right side and 2+ on the left side.     Heart sounds: Normal heart sounds, S1 normal and S2 normal. No murmur heard.   No friction rub. No gallop.  Pulmonary:     Effort: Pulmonary effort is normal. No respiratory distress.     Breath sounds: Normal breath sounds and air entry. No stridor. No wheezing, rhonchi or rales.  Chest:     Chest wall: No tenderness.  Abdominal:     General: Bowel sounds are normal.     Palpations: Abdomen is soft.     Tenderness: There is no abdominal tenderness. There is no guarding or rebound.  Musculoskeletal:        General:  Normal range of motion.     Cervical back: Full passive range of motion without pain, normal range of motion and neck supple. No edema, erythema, rigidity or tenderness. Normal range of motion.     Right lower leg: No edema.     Left lower leg: No edema.  Lymphadenopathy:     Cervical: No cervical adenopathy.     Right cervical: No superficial, deep or posterior cervical adenopathy.    Left cervical: No superficial, deep or posterior cervical adenopathy.  Skin:    General: Skin  is warm and dry.     Capillary Refill: Capillary refill takes less than 2 seconds.     Coloration: Skin is not jaundiced or pale.     Findings: No erythema or rash.  Neurological:     General: No focal deficit present.     Mental Status: She is alert and oriented to person, place, and time.     Cranial Nerves: No cranial nerve deficit.     Sensory: No sensory deficit.     Motor: Motor function is intact. No weakness.     Coordination: Coordination is intact. Coordination normal.     Gait: Gait normal.  Psychiatric:        Attention and Perception: Attention and perception normal. She is attentive.        Mood and Affect: Affect normal. Mood is anxious.        Speech: Speech normal.        Behavior: Behavior normal. Behavior is cooperative.        Thought Content: Thought content normal.        Cognition and Memory: Cognition normal.        Judgment: Judgment normal.      No results found for any visits on 06/13/21.  Assessment & Plan  ---------------------------------------------------------------------------------------------------------------------- Problem List Items Addressed This Visit       Other   B12 deficiency    Labs pending given current fatigue      H/O: hysterectomy    Potential hormonal changes given early hysterectomy      Menopausal and perimenopausal disorder   Avitaminosis D    Labs pending given fatigeu      Fatigue after COVID-19 vaccination - Primary    Endorses "brain  fog" Used to multi tasking; able to currently Struggles to do one thing at a time Has stopped all medications thinking it might be a "reaction" or an "allergy" No new medications Works alone, lives alone 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      Vonna Kotyk, FNP, have reviewed all documentation for this visit. The documentation on 06/13/21 for the exam, diagnosis, procedures, and orders are all accurate and complete.    Gwyneth Sprout, [place] [phone] (phone) [phone] (fax)  [place]

## 2021-06-13 NOTE — Assessment & Plan Note (Signed)
Potential hormonal changes given early hysterectomy

## 2021-06-13 NOTE — Assessment & Plan Note (Signed)
Labs pending given fatigeu

## 2021-06-13 NOTE — Assessment & Plan Note (Signed)
Labs pending given current fatigue

## 2021-06-13 NOTE — Assessment & Plan Note (Signed)
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

## 2021-06-20 LAB — CBC WITH DIFFERENTIAL/PLATELET
Basophils Absolute: 0 10*3/uL (ref 0.0–0.2)
Basos: 1 %
EOS (ABSOLUTE): 0.1 10*3/uL (ref 0.0–0.4)
Eos: 1 %
Hematocrit: 42.4 % (ref 34.0–46.6)
Hemoglobin: 14.1 g/dL (ref 11.1–15.9)
Immature Grans (Abs): 0 10*3/uL (ref 0.0–0.1)
Immature Granulocytes: 0 %
Lymphocytes Absolute: 1.9 10*3/uL (ref 0.7–3.1)
Lymphs: 35 %
MCH: 31.1 pg (ref 26.6–33.0)
MCHC: 33.3 g/dL (ref 31.5–35.7)
MCV: 93 fL (ref 79–97)
Monocytes Absolute: 0.5 10*3/uL (ref 0.1–0.9)
Monocytes: 9 %
Neutrophils Absolute: 3 10*3/uL (ref 1.4–7.0)
Neutrophils: 54 %
Platelets: 296 10*3/uL (ref 150–450)
RBC: 4.54 x10E6/uL (ref 3.77–5.28)
RDW: 12.5 % (ref 11.7–15.4)
WBC: 5.4 10*3/uL (ref 3.4–10.8)

## 2021-06-20 LAB — MONO QUAL W/RFLX QN: Mono Qual W/Rflx Qn: NEGATIVE

## 2021-06-20 LAB — RHEUMATOID FACTOR: Rheumatoid fact SerPl-aCnc: <10 IU/mL (ref <14.0)

## 2021-06-20 LAB — PROGESTERONE: Progesterone: 0.1 ng/mL

## 2021-06-20 LAB — COMPREHENSIVE METABOLIC PANEL
ALT: 14 IU/L (ref 0–32)
AST: 17 IU/L (ref 0–40)
Albumin/Globulin Ratio: 2.2 (ref 1.2–2.2)
Albumin: 4.7 g/dL (ref 3.8–4.9)
Alkaline Phosphatase: 68 IU/L (ref 44–121)
BUN/Creatinine Ratio: 16 (ref 9–23)
BUN: 12 mg/dL (ref 6–24)
Bilirubin Total: 0.3 mg/dL (ref 0.0–1.2)
CO2: 22 mmol/L (ref 20–29)
Calcium: 9.7 mg/dL (ref 8.7–10.2)
Chloride: 102 mmol/L (ref 96–106)
Creatinine, Ser: 0.74 mg/dL (ref 0.57–1.00)
Globulin, Total: 2.1 g/dL (ref 1.5–4.5)
Glucose: 81 mg/dL (ref 65–99)
Potassium: 4.2 mmol/L (ref 3.5–5.2)
Sodium: 138 mmol/L (ref 134–144)
Total Protein: 6.8 g/dL (ref 6.0–8.5)
eGFR: 95 mL/min/{1.73_m2} (ref >59)

## 2021-06-20 LAB — T3, FREE: T3, Free: 2.9 pg/mL (ref 2.0–4.4)

## 2021-06-20 LAB — BRAIN NATRIURETIC PEPTIDE: BNP: 13.9 pg/mL (ref 0.0–100.0)

## 2021-06-20 LAB — CORTISOL, ACTH STIMULATION

## 2021-06-20 LAB — VITAMIN B12: Vitamin B-12: 989 pg/mL (ref 232–1245)

## 2021-06-20 LAB — HEPATIC FUNCTION PANEL: Bilirubin, Direct: <0.1 mg/dL (ref 0.00–0.40)

## 2021-06-20 LAB — T3: T3, Total: 104 ng/dL (ref 71–180)

## 2021-06-20 LAB — ESTROGENS, TOTAL: Estrogen: 141 pg/mL (ref 40–244)

## 2021-06-20 LAB — MAGNESIUM: Magnesium: 2 mg/dL (ref 1.6–2.3)

## 2021-06-20 LAB — VITAMIN D 25 HYDROXY (VIT D DEFICIENCY, FRACTURES): Vit D, 25-Hydroxy: 30.2 ng/mL (ref 30.0–100.0)

## 2021-06-20 LAB — TSH: TSH: 2.12 u[IU]/mL (ref 0.450–4.500)

## 2021-06-20 LAB — T3 UPTAKE: T3 Uptake Ratio: 29 % (ref 24–39)

## 2021-06-20 LAB — T4, FREE: Free T4: 1.45 ng/dL (ref 0.82–1.77)

## 2021-06-20 LAB — FSH/LH
FSH: 63.8 m[IU]/mL
LH: 39.5 m[IU]/mL

## 2021-06-20 LAB — SEDIMENTATION RATE: Sed Rate: 2 mm/hr (ref 0–40)

## 2021-06-20 LAB — RENAL FUNCTION PANEL: Phosphorus: 3.5 mg/dL (ref 3.0–4.3)

## 2021-06-20 LAB — CK: Total CK: 51 U/L (ref 32–182)

## 2021-06-22 ENCOUNTER — Encounter: Payer: Self-pay | Admitting: Family Medicine

## 2021-06-25 ENCOUNTER — Telehealth: Payer: Self-pay

## 2021-06-25 ENCOUNTER — Other Ambulatory Visit: Payer: Self-pay | Admitting: Family Medicine

## 2021-06-25 DIAGNOSIS — B009 Herpesviral infection, unspecified: Secondary | ICD-10-CM

## 2021-06-25 MED ORDER — VALACYCLOVIR HCL 1 G PO TABS: 1000.0000 mg | ORAL_TABLET | Freq: Two times a day (BID) | ORAL | 0 refills | Status: AC

## 2021-06-25 NOTE — Telephone Encounter (Signed)
Copied from [place] [phone]. Topic: General - Other >> Jun 25, 2021  3:05 PM Leward Quan A wrote: Reason for CRM: Patient called in to inquire of Carlyle Dolly can  please place orders for her to get monkey pox testing when she come in this week to leave a urine sample. Please call patient with an answer at Ph# [phone]

## 2021-06-25 NOTE — Telephone Encounter (Signed)
Contacted patient to get more information on what symptoms she is having or if she was exposed to a known person who has been diagnosed with monkey pox. Patient states that she has the same symptoms that she discussed with PCP on 06/13/21, when I asked patient if she could tell me those symptoms the patient reported the following. 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 KW

## 2021-06-26 ENCOUNTER — Other Ambulatory Visit: Payer: Self-pay | Admitting: Family Medicine

## 2021-06-26 DIAGNOSIS — B349 Viral infection, unspecified: Secondary | ICD-10-CM

## 2021-06-26 NOTE — Telephone Encounter (Signed)
Spoke with patient who stated that she would have to get test ordered by her family physician and stated that she would not be going to ED, I advised Tally Joe who agreed to place order in and patient was instructed to go to a [facility] facility to have lab drawn. Patient also requested to have lab ordered to test her urine for infection. Patient complains of dysuria and frequency for 2 weeks. Orders were placed to have testing. KW

## 2021-06-27 LAB — URINALYSIS, ROUTINE W REFLEX MICROSCOPIC
Bilirubin, UA: NEGATIVE
Glucose, UA: NEGATIVE
Ketones, UA: NEGATIVE
Leukocytes,UA: NEGATIVE
Nitrite, UA: NEGATIVE
Protein,UA: NEGATIVE
RBC, UA: NEGATIVE
Specific Gravity, UA: 1.011 (ref 1.005–1.030)
Urobilinogen, Ur: 0.2 mg/dL (ref 0.2–1.0)
pH, UA: 5.5 (ref 5.0–7.5)

## 2021-06-28 ENCOUNTER — Ambulatory Visit: Payer: Managed Care, Other (non HMO) | Admitting: Internal Medicine

## 2021-06-28 ENCOUNTER — Encounter: Payer: Self-pay | Admitting: Internal Medicine

## 2021-06-28 ENCOUNTER — Other Ambulatory Visit: Payer: Self-pay

## 2021-06-28 VITALS — BP 104/72 | HR 78 | Temp 98.4°F | Resp 16 | Ht 62.0 in | Wt 131.0 lb

## 2021-06-28 DIAGNOSIS — G4700 Insomnia, unspecified: Secondary | ICD-10-CM

## 2021-06-28 DIAGNOSIS — Z23 Encounter for immunization: Secondary | ICD-10-CM | POA: Diagnosis not present

## 2021-06-28 DIAGNOSIS — M25569 Pain in unspecified knee: Secondary | ICD-10-CM | POA: Insufficient documentation

## 2021-06-28 MED ORDER — ESZOPICLONE 3 MG PO TABS: ORAL_TABLET | ORAL | 0 refills | Status: DC

## 2021-06-28 NOTE — Progress Notes (Signed)
[facility] [place], [place] [address]  Internal MEDICINE  Office Visit Note  Patient Name: Theresa Walton  [id number]  [id number]  Date of Service: 07/04/2021  Chief Complaint  Patient presents with   Insomnia    6 month follow up    HPI Patient is seen for pulmonary follow-up. She suffers from chronic insomnia which is treated with Lunesta  she is here to get her refills. Pt has tried multiple other medications in the past with no relief  ORS is reviewed   Current Medication: Outpatient Encounter Medications as of 06/28/2021  Medication Sig   azelastine (ASTELIN) 0.1 % nasal spray Place into the nose.   cetirizine (ZYRTEC) 5 MG tablet Take 5 mg by mouth daily.   cyclobenzaprine (FLEXERIL) 5 MG tablet TAKE 1 TO 2 TABLETS BY MOUTH AT BEDTIME   estradiol (VIVELLE-DOT) 0.0375 MG/24HR Place 1 patch onto the skin 2 (two) times a week.   levocetirizine (XYZAL) 5 MG tablet Alternates with zyrtec   levothyroxine (SYNTHROID) 25 MCG tablet Take 1 tablet (25 mcg total) by mouth daily before breakfast.   predniSONE (STERAPRED UNI-PAK 21 TAB) 5 MG (21) TBPK tablet TAKE AS DIRECTED ON PACKAGE   valACYclovir (VALTREX) 1000 MG tablet Take 1 tablet (1,000 mg total) by mouth 2 (two) times daily for 10 days.   [DISCONTINUED] Eszopiclone 3 MG TABS TAKE 1 TABLET(3 MG) BY MOUTH AT BEDTIME   Eszopiclone 3 MG TABS TAKE 1 TABLET(3 MG) BY MOUTH AT BEDTIME   [DISCONTINUED] dicyclomine (BENTYL) 10 MG capsule Take 1 capsule (10 mg total) by mouth 4 (four) times daily -  before meals and at bedtime. (Patient not taking: No sig reported)   [DISCONTINUED] sertraline (ZOLOFT) 50 MG tablet Take 1 tablet (50 mg total) by mouth at bedtime. (Patient not taking: No sig reported)   No facility-administered encounter medications on file as of 06/28/2021.    Surgical History: Past Surgical History:  Procedure Laterality Date   ABDOMINAL HYSTERECTOMY     ANTERIOR CRUCIATE LIGAMENT REPAIR  Right 2012   ARTHROSCOPIC REPAIR ACL  2013   Right knee   AUGMENTATION MAMMAPLASTY Bilateral 2000   saline   BLADDER REPAIR     BREAST ENHANCEMENT SURGERY  1995   COLONOSCOPY WITH PROPOFOL N/A 08/29/2017   Procedure: COLONOSCOPY WITH PROPOFOL;  Surgeon: Jonathon Bellows, MD;  Location: [facility] ENDOSCOPY;  Service: Gastroenterology;  Laterality: N/A;   CYSTOCELE REPAIR  2013   ENDOMETRIAL ABLATION  2013   NASAL SEPTUM SURGERY  1987, 1991, 2001   PARTIAL HYSTERECTOMY  2015   PARTIAL HYSTERECTOMY Bilateral 2014   RECTOPEXY  2011   TONSILECTOMY, ADENOIDECTOMY, BILATERAL MYRINGOTOMY AND TUBES  1984   TOOTH EXTRACTION      Medical History: Past Medical History:  Diagnosis Date   Basal cell carcinoma (BCC) of jawline 06/26/2018   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[employer])    Genital herpes    Hypothyroidism    Seasonal allergies    Syncope and collapse    Thyroid disease    hypothyroidism    Family History: Family History  Problem Relation Age of Onset   Pulmonary fibrosis Mother    Healthy Sister    Healthy Brother    Healthy Sister    Lung cancer Paternal Grandmother    Arrhythmia Father    Heart disease Father    Prostate cancer Neg Hx    Bladder Cancer Neg Hx    Kidney cancer Neg Hx    Breast  cancer Neg Hx     Social History   Socioeconomic History   Marital status: Divorced    Spouse name: Not on file   Number of children: Not on file   Years of education: Not on file   Highest education level: Not on file  Occupational History   Not on file  Tobacco Use   Smoking status: Former    Types: Cigarettes    Quit date: 05/15/2015    Years since quitting: 6.1   Smokeless tobacco: Never  Vaping Use   Vaping Use: Never used  Substance and Sexual Activity   Alcohol use: No    Alcohol/week: 0.0 standard drinks   Drug use: No   Sexual activity: Never    Partners: Male    Birth control/protection: Surgical  Other Topics Concern   Not on file  Social History Narrative   Not on  file   Social Determinants of Health   Financial Resource Strain: Not on file  Food Insecurity: Not on file  Transportation Needs: Not on file  Physical Activity: Not on file  Stress: Not on file  Social Connections: Not on file  Intimate Partner Violence: Not on file      Review of Systems  Constitutional:  Negative for fatigue and fever.  HENT:  Negative for congestion, mouth sores and postnasal drip.   Respiratory:  Negative for cough.   Cardiovascular:  Negative for chest pain.  Genitourinary:  Negative for flank pain.  Psychiatric/Behavioral: Negative.     Vital Signs: BP 104/72   Pulse 78   Temp 98.4 F (36.9 C)   Resp 16   Ht '5\' 2"'$  (1.575 m)   Wt 131 lb (59.4 kg)   SpO2 97%   BMI 23.96 kg/m    Physical Exam Constitutional:      Appearance: Normal appearance.  HENT:     Head: Normocephalic and atraumatic.     Nose: Nose normal.     Mouth/Throat:     Mouth: Mucous membranes are moist.     Pharynx: No posterior oropharyngeal erythema.  Eyes:     Extraocular Movements: Extraocular movements intact.     Pupils: Pupils are equal, round, and reactive to light.  Cardiovascular:     Pulses: Normal pulses.     Heart sounds: Normal heart sounds.  Pulmonary:     Effort: Pulmonary effort is normal.     Breath sounds: Normal breath sounds.  Neurological:     General: No focal deficit present.     Mental Status: She is alert.  Psychiatric:        Mood and Affect: Mood normal.        Behavior: Behavior normal.       Assessment/Plan: 1. Insomnia, unspecified type Refilled Lunesta, PDMP is reviewed  - Eszopiclone 3 MG TABS; TAKE 1 TABLET(3 MG) BY MOUTH AT BEDTIME  Dispense: 90 tablet; Refill: 0  2. Need for Streptococcus pneumoniae vaccination - Pneumococcal conjugate vaccine 20-valent   General Counseling: Keiana verbalizes understanding of the findings of todays visit and agrees with plan of treatment. I have discussed any further diagnostic evaluation  that may be needed or ordered today. We also reviewed her medications today. she has been encouraged to call the office with any questions or concerns that should arise related to todays visit.  Reviewed risks and possible side effects associated with CNS depressants. Combination of these with alcohol could cause dizziness and drowsiness. Advised patient not to drive or operate machinery  when taking these medications, as patient's and other's life can be at risk and will have consequences. Patient verbalized understanding in this matter. Dependence and abuse for these drugs will be monitored closely. A Controlled substance policy and procedure is on file which allows [facility] to order a urine drug screen test at any visit. Patient understands and agrees with the plan   Orders Placed This Encounter  Procedures   Pneumococcal conjugate vaccine 20-valent    Meds ordered this encounter  Medications   Eszopiclone 3 MG TABS    Sig: TAKE 1 TABLET(3 MG) BY MOUTH AT BEDTIME    Dispense:  90 tablet    Refill:  0    Total time spent:20 Minutes Time spent includes review of chart, medications, test results, and follow up plan with the patient.   [place] Controlled Substance Database was reviewed by me.   Dr Lavera Guise Internal medicine

## 2021-06-28 NOTE — Progress Notes (Signed)
Established patient visit   Patient: Theresa Walton   DOB: [birth year]   55 y.o. Female  MRN: [id number] Visit Date: 06/29/2021  Today's healthcare provider: Gwyneth Sprout, FNP   Chief Complaint  Patient presents with   Follow-up   Subjective  -------------------------------------------------------------------------------------------------------------------- HPI  Follow up for Fatigue  The patient was last seen for this 2 weeks ago. Changes made at last visit include labs ordered patient started on Valtrex '1000mg'$ .  She reports excellent compliance with treatment. She feels that condition is Improved. She is not having side effects.  Patient reports today that fatigue is improving but still reports that she is experiencing "brain fog" and states that rash around mouth, chin and scalp has cleared.  Patient wants to resume SSRI and is currently seeking counseling; on an as needed basis.  -----------------------------------------------------------------------------------------     Medications: Outpatient Medications Prior to Visit  Medication Sig   azelastine (ASTELIN) 0.1 % nasal spray Place into the nose.   cetirizine (ZYRTEC) 5 MG tablet Take 5 mg by mouth daily.   cyclobenzaprine (FLEXERIL) 5 MG tablet TAKE 1 TO 2 TABLETS BY MOUTH AT BEDTIME   estradiol (VIVELLE-DOT) 0.0375 MG/24HR Place 1 patch onto the skin 2 (two) times a week.   Eszopiclone 3 MG TABS TAKE 1 TABLET(3 MG) BY MOUTH AT BEDTIME   levocetirizine (XYZAL) 5 MG tablet Alternates with zyrtec   levothyroxine (SYNTHROID) 25 MCG tablet Take 1 tablet (25 mcg total) by mouth daily before breakfast.   predniSONE (STERAPRED UNI-PAK 21 TAB) 5 MG (21) TBPK tablet TAKE AS DIRECTED ON PACKAGE   valACYclovir (VALTREX) 1000 MG tablet Take 1 tablet (1,000 mg total) by mouth 2 (two) times daily for 10 days.   No facility-administered medications prior to visit.    Review of Systems     Objective   -------------------------------------------------------------------------------------------------------------------- BP 96/70   Pulse 70   Temp 98.2 F (36.8 C) (Oral)   Resp 16   Wt 128 lb 3.2 oz (58.2 kg)   SpO2 100%   BMI 23.45 kg/m    Physical Exam Vitals and nursing note reviewed.  Constitutional:      General: She is not in acute distress.    Appearance: Normal appearance. She is normal weight. She is not ill-appearing, toxic-appearing or diaphoretic.  HENT:     Head: Normocephalic and atraumatic.  Cardiovascular:     Rate and Rhythm: Normal rate and regular rhythm.     Pulses: Normal pulses.     Heart sounds: Normal heart sounds. No murmur heard.   No friction rub. No gallop.  Pulmonary:     Effort: Pulmonary effort is normal. No respiratory distress.     Breath sounds: Normal breath sounds. No stridor. No wheezing, rhonchi or rales.  Chest:     Chest wall: No tenderness.  Abdominal:     General: Bowel sounds are normal.     Palpations: Abdomen is soft.  Musculoskeletal:        General: No swelling, tenderness, deformity or signs of injury. Normal range of motion.     Right lower leg: No edema.     Left lower leg: No edema.  Skin:    General: Skin is warm and dry.     Capillary Refill: Capillary refill takes less than 2 seconds.     Coloration: Skin is not jaundiced or pale.     Findings: No bruising, erythema, lesion or rash.     Comments: All HSV lesions  now healed with use of Rx.  Neurological:     General: No focal deficit present.     Mental Status: She is alert and oriented to person, place, and time. Mental status is at baseline.     Cranial Nerves: No cranial nerve deficit.     Sensory: No sensory deficit.     Motor: No weakness.     Coordination: Coordination normal.  Psychiatric:        Mood and Affect: Mood normal.        Behavior: Behavior normal.        Thought Content: Thought content normal.        Judgment: Judgment normal.      No  results found for any visits on 06/29/21.  Assessment & Plan  --------------------------------------------------------------------------------------------------------------------- Problem List Items Addressed This Visit       Other   Situational anxiety    Encouraged patient to seek as need counseling more frequently in light of the upcoming stress/anxiety from the preparation and surgery  Wishes to restart on SSRI at this time; Refill sent      Relevant Medications   sertraline (ZOLOFT) 50 MG tablet   Need for shingles vaccine    Due for second vaccine; feeling much better overall Discussed risks/benefits Consent obtained      Chronic fatigue - Primary    Much improved since resuming medications and following near completion of the prednisone taper  Reassured that all labs are stable  Continue to follow general well being guidelines/recommendations      Situational stress    Patient has matched to be a living kidney donor to her best friend's husband  Anticipates donation and preparation in the next 6 months- will take place at [place]        Return in about 6 months (around 12/27/2021) for chonic disease management.      Theresa Kotyk, FNP, have reviewed all documentation for this visit. The documentation on 06/29/21 for the exam, diagnosis, procedures, and orders are all accurate and complete.  AVS printed; however, patient declined copy upon receipt  Theresa Walton, [place] [phone] (phone) [phone] (fax)  [place]

## 2021-06-29 ENCOUNTER — Encounter: Payer: Self-pay | Admitting: Family Medicine

## 2021-06-29 ENCOUNTER — Ambulatory Visit: Payer: Managed Care, Other (non HMO) | Admitting: Family Medicine

## 2021-06-29 VITALS — BP 96/70 | HR 70 | Temp 98.2°F | Resp 16 | Wt 128.2 lb

## 2021-06-29 DIAGNOSIS — F418 Other specified anxiety disorders: Secondary | ICD-10-CM

## 2021-06-29 DIAGNOSIS — R5382 Chronic fatigue, unspecified: Secondary | ICD-10-CM | POA: Diagnosis not present

## 2021-06-29 DIAGNOSIS — F439 Reaction to severe stress, unspecified: Secondary | ICD-10-CM

## 2021-06-29 DIAGNOSIS — Z23 Encounter for immunization: Secondary | ICD-10-CM | POA: Diagnosis not present

## 2021-06-29 MED ORDER — SERTRALINE HCL 50 MG PO TABS: 50.0000 mg | ORAL_TABLET | Freq: Every day | ORAL | 3 refills | Status: DC

## 2021-06-29 NOTE — Assessment & Plan Note (Signed)
Patient has matched to be a living kidney donor to her best friend's husband  Anticipates donation and preparation in the next 6 months- will take place at [facility]

## 2021-06-29 NOTE — Assessment & Plan Note (Signed)
Encouraged patient to seek as need counseling more frequently in light of the upcoming stress/anxiety from the preparation and surgery  Wishes to restart on SSRI at this time; Refill sent

## 2021-06-29 NOTE — Assessment & Plan Note (Signed)
Due for second vaccine; feeling much better overall Discussed risks/benefits Consent obtained

## 2021-06-29 NOTE — Addendum Note (Signed)
Addended by: Minette Headland on: 06/29/2021 08:39 AM   Modules accepted: Orders

## 2021-06-29 NOTE — Assessment & Plan Note (Signed)
Much improved since resuming medications and following near completion of the prednisone taper  Reassured that all labs are stable  Continue to follow general well being guidelines/recommendations

## 2021-06-30 LAB — CULTURE, URINE COMPREHENSIVE

## 2021-07-04 MED ORDER — PREVNAR 20 0.5 ML IM SUSY: 0.5000 mL | PREFILLED_SYRINGE | INTRAMUSCULAR | 0 refills | Status: AC

## 2021-07-13 ENCOUNTER — Other Ambulatory Visit: Payer: Self-pay | Admitting: Physician Assistant

## 2021-07-13 DIAGNOSIS — M62838 Other muscle spasm: Secondary | ICD-10-CM

## 2021-07-14 ENCOUNTER — Other Ambulatory Visit: Payer: Self-pay | Admitting: Physician Assistant

## 2021-07-14 DIAGNOSIS — E039 Hypothyroidism, unspecified: Secondary | ICD-10-CM

## 2021-07-17 ENCOUNTER — Telehealth: Payer: Self-pay

## 2021-07-17 NOTE — Telephone Encounter (Signed)
Copied from [place] [id number]. Topic: Appointment Scheduling - Scheduling Inquiry for Clinic >> Jul 17, 2021  9:58 AM Tessa Lerner A wrote: Reason for CRM: The patient shares that they are continuing to experience urinary discomfort even after the results of their culture from their previous visit have come back negative for bacterial infection   The patient shares that they're continuing to experience urinary discomfort and don't believe that they'll be able to wait until 07/31/21 to be seen by a member of clinical staff  Please contact further when possible

## 2021-07-18 ENCOUNTER — Telehealth: Payer: Self-pay

## 2021-07-18 ENCOUNTER — Other Ambulatory Visit (INDEPENDENT_AMBULATORY_CARE_PROVIDER_SITE_OTHER): Payer: Managed Care, Other (non HMO)

## 2021-07-18 ENCOUNTER — Other Ambulatory Visit: Payer: Self-pay

## 2021-07-18 ENCOUNTER — Other Ambulatory Visit: Payer: Self-pay | Admitting: Family Medicine

## 2021-07-18 ENCOUNTER — Encounter: Payer: Self-pay | Admitting: Family Medicine

## 2021-07-18 ENCOUNTER — Ambulatory Visit: Payer: Self-pay | Admitting: *Deleted

## 2021-07-18 DIAGNOSIS — N949 Unspecified condition associated with female genital organs and menstrual cycle: Secondary | ICD-10-CM

## 2021-07-18 DIAGNOSIS — E034 Atrophy of thyroid (acquired): Secondary | ICD-10-CM

## 2021-07-18 LAB — POCT URINALYSIS DIPSTICK
Bilirubin, UA: NEGATIVE
Blood, UA: NEGATIVE
Glucose, UA: NEGATIVE
Ketones, UA: NEGATIVE
Leukocytes, UA: NEGATIVE
Nitrite, UA: NEGATIVE
Protein, UA: POSITIVE — AB
Spec Grav, UA: <=1.005 — AB (ref 1.010–1.025)
Urobilinogen, UA: 0.2 E.U./dL
pH, UA: 8 (ref 5.0–8.0)

## 2021-07-18 MED ORDER — TRIAMCINOLONE ACETONIDE 0.1 % EX CREA: 1.0000 "application " | TOPICAL_CREAM | Freq: Two times a day (BID) | CUTANEOUS | 0 refills | Status: DC

## 2021-07-18 MED ORDER — LEVOTHYROXINE SODIUM 25 MCG PO TABS: 25.0000 ug | ORAL_TABLET | Freq: Every day | ORAL | 3 refills | Status: DC

## 2021-07-18 NOTE — Telephone Encounter (Signed)
I returned pt's call.    She had called in for an appt pertaining to UTI symptoms.   A CRM has been sent requesting a work in appt by the agent.   There was a note placed in nurse triage to call her which I did.    I was seen there 2 weeks ago for UTI symptoms.    My urine was clear.   I'm having discomfort and frequent urination still so I was wanting to see if I could be worked in sooner.    I was seen at [facility] and the urine came back clear.  I'm being worked up to see if I can possibly donate a kidney to a friend.   They also did a urine test on me which came back clear.   I'm not sure what is causing me to have urinary frequency and discomfort in my vaginal area.  I'm going out of town Friday for a week.   I would like to be seen before I leave town if at all possible.   I let her know I would pass this information along.  I forwarded her request and information to [facility].

## 2021-07-18 NOTE — Telephone Encounter (Signed)
Medication Refill - Medication: levothyroxine (SYNTHROID) 25 MCG tablet, after tomorrow patient will be out.   Has the patient contacted their pharmacy? Yes.    (Agent: If yes, when and what did the pharmacy advise?) Contact PCP office due to sending several refill request and no response.   Preferred Pharmacy (with phone number or street name):  [facility], [place] AT [address] Phone:  [phone]  Fax:  [phone]      Has the patient been seen for an appointment in the last year OR does the patient have an upcoming appointment? No, patient last seen 06/29/2021  Agent: Please be advised that RX refills may take up to 3 business days. We ask that you follow-up with your pharmacy.     [facility] [phone] [address], [place] AT [facility] [address] [facility] Phone:  [phone]  Fax:  [phone]

## 2021-07-18 NOTE — Telephone Encounter (Signed)
Pt came in to leave urine specimen per Daneil Dan.

## 2021-07-18 NOTE — Telephone Encounter (Signed)
Requested medication (s) are due for refill today:   Yes for the 25 mcg dose.   The 50 mcg dose is what was requested on 07/14/2021 #90, 3 refills.  Requested medication (s) are on the active medication list:   Yes  Future visit scheduled:   No   Seen 2 wks ago   Last ordered: 10/11/2020 #90, 1 refill 25 mcg.        What was requested was the 50 mcg dose on 07/14/2021 #90, 3 refills   Returned for dose clarification.  Requested Prescriptions  Pending Prescriptions Disp Refills   levothyroxine (SYNTHROID) 25 MCG tablet 90 tablet 1    Sig: Take 1 tablet (25 mcg total) by mouth daily before breakfast.     Endocrinology:  Hypothyroid Agents Failed - 07/18/2021 12:25 PM      Failed - TSH needs to be rechecked within 3 months after an abnormal result. Refill until TSH is due.      Passed - TSH in normal range and within 360 days    TSH  Date Value Ref Range Status  06/13/2021 2.120 0.450 - 4.500 uIU/mL Final          Passed - Valid encounter within last 12 months    Recent Outpatient Visits           2 weeks ago Chronic fatigue   [facility] Gwyneth Sprout, FNP   1 month ago Fatigue after COVID-19 vaccination   [facility] Gwyneth Sprout, FNP   2 months ago Annual physical exam   [place], PA-C   6 months ago Acute cystitis without hematuria   [facility], Dionne Bucy, MD   6 months ago Urinary frequency   [facility], Fabio Bering [place], [place]

## 2021-07-18 NOTE — Telephone Encounter (Signed)
Copied from [place] [phone]. Topic: Appointment Scheduling - Scheduling Inquiry for Clinic >> Jul 18, 2021  9:26 AM Oneta Rack wrote: Patient requesting a work in for frequent urination and vaginal discomfort, placed patient on wait list.

## 2021-07-19 ENCOUNTER — Other Ambulatory Visit: Payer: Self-pay | Admitting: Family Medicine

## 2021-07-23 ENCOUNTER — Other Ambulatory Visit: Payer: Self-pay | Admitting: Family Medicine

## 2021-08-02 ENCOUNTER — Other Ambulatory Visit: Payer: Self-pay

## 2021-08-02 ENCOUNTER — Ambulatory Visit
Admission: RE | Admit: 2021-08-02 | Discharge: 2021-08-02 | Disposition: A | Discharge status: Home / Self Care | Payer: Managed Care, Other (non HMO) | Source: Ambulatory Visit | Attending: Family Medicine | Admitting: Family Medicine

## 2021-08-02 DIAGNOSIS — Z01419 Encounter for gynecological examination (general) (routine) without abnormal findings: Secondary | ICD-10-CM

## 2021-08-02 DIAGNOSIS — Z1231 Encounter for screening mammogram for malignant neoplasm of breast: Secondary | ICD-10-CM | POA: Insufficient documentation

## 2021-09-05 ENCOUNTER — Other Ambulatory Visit: Payer: Self-pay

## 2021-09-05 ENCOUNTER — Ambulatory Visit: Payer: Managed Care, Other (non HMO) | Admitting: Family Medicine

## 2021-09-05 ENCOUNTER — Encounter: Payer: Self-pay | Admitting: Family Medicine

## 2021-09-05 VITALS — BP 100/67 | HR 69 | Wt 134.0 lb

## 2021-09-05 DIAGNOSIS — N949 Unspecified condition associated with female genital organs and menstrual cycle: Secondary | ICD-10-CM | POA: Diagnosis not present

## 2021-09-05 DIAGNOSIS — N959 Unspecified menopausal and perimenopausal disorder: Secondary | ICD-10-CM

## 2021-09-05 LAB — POCT URINALYSIS DIPSTICK
Bilirubin, UA: NEGATIVE
Blood, UA: NEGATIVE
Glucose, UA: NEGATIVE
Ketones, UA: NEGATIVE
Leukocytes, UA: NEGATIVE
Nitrite, UA: NEGATIVE
Protein, UA: NEGATIVE
Spec Grav, UA: <=1.005 — AB (ref 1.010–1.025)
Urobilinogen, UA: 0.2 E.U./dL
pH, UA: 7 (ref 5.0–8.0)

## 2021-09-05 MED ORDER — ESTRADIOL 0.05 MG/24HR TD PTTW: 1.0000 | MEDICATED_PATCH | TRANSDERMAL | 0 refills | Status: DC

## 2021-09-05 NOTE — Progress Notes (Signed)
RGYN patient here to discuss hormone medication.  Pt notes feeling off in her vaginal area, odd sensation no pain however concerning. denies any vaginal itching , no odor, no discharge. Notes vaginal discharge pt is not currently sexually active.    *Last Seen 02/22/21 for Annual.

## 2021-09-05 NOTE — Progress Notes (Signed)
   Subjective:    Patient ID: Theresa Walton is a 55 y.o. female presenting with No chief complaint on file.  on 09/05/2021  HPI: Reports discomfort in her vagina. She is s/p hyst with some vaginal support repairs. She had had a prior bladder surgery and rectopexy prior to hyst at [facility]. She is denies vaginal discharge, odor or irritation.  Also having some clammy spells. Unclear if her Lorna Dibble is working. Not having night sweats anymore.  Review of Systems  Constitutional:  Negative for chills and fever.  Respiratory:  Negative for shortness of breath.   Cardiovascular:  Negative for chest pain.  Gastrointestinal:  Negative for abdominal pain, nausea and vomiting.  Genitourinary:  Negative for dysuria.  Skin:  Negative for rash.     Objective:    BP 100/67   Pulse 69   Wt 134 lb (60.8 kg)   BMI 24.51 kg/m  Physical Exam Constitutional:      General: She is not in acute distress.    Appearance: She is well-developed.  HENT:     Head: Normocephalic and atraumatic.  Eyes:     General: No scleral icterus. Cardiovascular:     Rate and Rhythm: Normal rate.  Pulmonary:     Effort: Pulmonary effort is normal.  Abdominal:     Palpations: Abdomen is soft.  Genitourinary:    Comments: Cuff is intact, posterior and anterior defect. Has some anterior firm portions of the vagina that are tender and I wonder if there is any mesh erosion happening. Musculoskeletal:     Cervical back: Neck supple.  Skin:    General: Skin is warm and dry.  Neurological:     Mental Status: She is alert and oriented to person, place, and time.   Operation PR VAG HYST,RMV TUBE/OVARY,FIX ENTEROCE   PR REVAGINAL PROLAPSE,UTEROSACRAL   PR CYSTOURETHROSCOPY   VAGINAL HYSTERECTOMY, FOR UTERUS 250 G OR LESS; WITH REMOVAL TUBE(S), W/REPAIR OF ENTEROCELE   CULPOPEXY, VAGINAL; INTRA-PERITONEAL APPROACH (UTEROSACRAL, LEVATOR MYORRHAPHY)   CYSTOURETHROSCOPY (SEPARATE PROCEDURE)       Assessment & Plan:    Problem List Items Addressed This Visit       Unprioritized   Menopausal and perimenopausal disorder    Will give 2 weeks of increase dosing of Vivelle, and if works, will switch to this dose, if does not, will return to previous dose.      Relevant Medications   estradiol (VIVELLE-DOT) 0.05 MG/24HR patch   Other Visit Diagnoses     Vaginal discomfort    -  Primary   I am unclear what is going on--and will ask Urogyn to check and be sure everything is ok--previously a pt. at [facility]   Relevant Orders   Ambulatory referral to Urogynecology   POCT Urinalysis Dipstick (Completed)       Return if symptoms worsen or fail to improve.  Theresa Walton 09/05/2021 1:27 PM

## 2021-09-06 ENCOUNTER — Encounter: Payer: Self-pay | Admitting: Family Medicine

## 2021-09-06 NOTE — Assessment & Plan Note (Signed)
Will give 2 weeks of increase dosing of Vivelle, and if works, will switch to this dose, if does not, will return to previous dose.

## 2021-09-12 ENCOUNTER — Ambulatory Visit: Payer: Managed Care, Other (non HMO) | Admitting: Obstetrics and Gynecology

## 2021-09-12 ENCOUNTER — Encounter: Payer: Self-pay | Admitting: Obstetrics and Gynecology

## 2021-09-12 ENCOUNTER — Other Ambulatory Visit: Payer: Self-pay

## 2021-09-12 VITALS — BP 121/79 | HR 83 | Wt 134.0 lb

## 2021-09-12 DIAGNOSIS — R350 Frequency of micturition: Secondary | ICD-10-CM | POA: Diagnosis not present

## 2021-09-12 DIAGNOSIS — N8110 Cystocele, unspecified: Secondary | ICD-10-CM | POA: Diagnosis not present

## 2021-09-12 DIAGNOSIS — R3915 Urgency of urination: Secondary | ICD-10-CM | POA: Diagnosis not present

## 2021-09-12 MED ORDER — MIRABEGRON ER 50 MG PO TB24: 50.0000 mg | ORAL_TABLET | Freq: Every day | ORAL | 5 refills | Status: DC
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
## 2021-10-12 ENCOUNTER — Telehealth: Payer: Self-pay | Admitting: Gastroenterology

## 2021-10-12 ENCOUNTER — Ambulatory Visit: Payer: Managed Care, Other (non HMO) | Admitting: Obstetrics and Gynecology

## 2021-10-12 NOTE — Telephone Encounter (Signed)
Returned pt's call regarding request for antibiotics for a possible diverticulitis flare. Advised pt Dr. Vicente Males hasn't seen her for this and it looks like her last treatment came from her PCP. Advised pt Dr. Vicente Males is unavailable for a couple of weeks and she is should reach out to her PCP.

## 2021-10-12 NOTE — Telephone Encounter (Signed)
Inbound call from pt requesting a antibiotics because she is having a flare up of diverticulitis. Please advise.

## 2021-10-24 ENCOUNTER — Other Ambulatory Visit: Payer: Self-pay | Admitting: Family Medicine

## 2021-10-24 DIAGNOSIS — N959 Unspecified menopausal and perimenopausal disorder: Secondary | ICD-10-CM

## 2021-10-25 MED ORDER — ESTRADIOL 0.05 MG/24HR TD PTTW: 1.0000 | MEDICATED_PATCH | TRANSDERMAL | 0 refills | Status: DC

## 2021-10-31 ENCOUNTER — Ambulatory Visit: Payer: Managed Care, Other (non HMO) | Admitting: Obstetrics and Gynecology

## 2021-11-01 ENCOUNTER — Ambulatory Visit: Payer: Self-pay | Admitting: *Deleted

## 2021-11-01 NOTE — Telephone Encounter (Signed)
Reason for Disposition  Common cold with no complications  Answer Assessment - Initial Assessment Questions 1. ONSET: "When did the nasal discharge start?"      I returned her call.    She is having nasal congestion.   Using a Netti Pot getting clear thick mucus out.   Not coughing.   Right ear pressure.   I have sinus issues worse on the right side.   No sore throat. 2. AMOUNT: "How much discharge is there?"      *No Answer* 3. COUGH: "Do you have a cough?" If yes, ask: "Describe the color of your sputum" (clear, white, yellow, green)     No 4. RESPIRATORY DISTRESS: "Describe your breathing."      No chest a little tight 5. FEVER: "Do you have a fever?" If Yes, ask: "What is your temperature, how was it measured, and when did it start?"     No 6. SEVERITY: "Overall, how bad are you feeling right now?" (e.g., doesn't interfere with normal activities, staying home from school/work, staying in bed)      She used OTC cold and flu medication which helped.   Continue using your [employer].   No post nasal drip.   Fatigue Covid negative. 7. OTHER SYMPTOMS: "Do you have any other symptoms?" (e.g., sore throat, earache, wheezing, vomiting)     Stomach bug - I'm having difficulty digesting food.   I had some spasms in my stomach.   I eat bland foods for several days.   I ate grilled chicken breast yesterday and it caused a lot of burping.   I took anti reflux medication.   I'm having muscle aches too.    I suggested Mucinex DM, Dayquil for the congestion.    She was receptive to this.   No diarrhea.   Tend to be constipated.   I'm taking stool softener at night.    My niece had the same symptoms.    8. PREGNANCY: "Is there any chance you are pregnant?" "When was your last menstrual period?"     *No Answer*  Protocols used: Common Cold-A-AH

## 2021-11-01 NOTE — Telephone Encounter (Signed)
°  Chief Complaint: head cold and stomach but since Christmas Symptoms: nasal congestion, clear mucus, clearing throat.    Stomach not digesting food well.   Eating bland foods for now.  Pressure in right ear. Frequency: since Christmas.   Niece was sick with same symptoms over Christmas. Pertinent Negatives: Patient denies diarrhea, sore throat, coughing.   Disposition: [] ED /[] Urgent Care (no appt availability in office) / [] Appointment(In office/virtual)/ []  [facility]/ [x] Home Care/ [] Refused Recommended Disposition /[] [facility]/ []  Follow-up with PCP Additional Notes: Pt agrees to call back if symptoms don't improve with the home care advice after 3-5 days.

## 2021-11-06 ENCOUNTER — Other Ambulatory Visit: Payer: Self-pay

## 2021-11-06 ENCOUNTER — Ambulatory Visit: Payer: Managed Care, Other (non HMO) | Admitting: Physician Assistant

## 2021-11-06 VITALS — BP 101/65 | HR 70 | Temp 98.2°F | Wt 132.0 lb

## 2021-11-06 DIAGNOSIS — J069 Acute upper respiratory infection, unspecified: Secondary | ICD-10-CM

## 2021-11-06 NOTE — Patient Instructions (Addendum)
For your constipation symptoms I recommend trying Miralax - please make sure to stay hydrated while taking this You can also supplement with fiber to help your stools stay softer and easier to pass.   Based on your described symptoms and the duration of symptoms it is likely that you have a viral upper respiratory infection (often called a "cold")  Symptoms can last for 3-10 days with lingering cough and intermittent symptoms lasting weeks after that.  The goal of treatment at this time is to reduce your symptoms and discomfort   You can use over the counter medications such as Dayquil/Nyquil, AlkaSeltzer formulations, etc to provide further relief of symptoms according to the manufacturer's instructions  If preferred you can use Coricidin to manage your symptoms rather than those medications mentioned above.    If your symptoms do not improve or become worse in the next 5-7 days please make an apt at the office so we can see you  Go to the ER if you begin to have more serious symptoms such as shortness of breath, trouble breathing, loss of consciousness, swelling around the eyes, high fever, severe lasting headaches, vision changes or neck pain/stiffness.

## 2021-11-06 NOTE — Progress Notes (Signed)
Acute Office Visit  Subjective:    Patient ID: Theresa Walton, female    DOB: Nov 06, 1965, 56 y.o.   MRN: [id number]  Introduced myself to the patient as a PA-C and provided education on APPs in clinical practice.   HPI Patient is in today for cough, congestion and change in bowel habits since Christmas.  She states she has had other family members with similar symptoms.  She has tested for Covid-19 about 1 week ago and it was negative.  She has not tested for flu and has not had flu shot.  She is around small children at church they may have had things going around that group.  She had a virtual visit about 1 week ago through her insurance company and they did not treat anything but advised she see her PCP if she did not get better.  She reports she has used Mucinex, neti-pot, Dayquil at intervals throughout illness but these have not helped with course of treatment  Reports continued, current, congestion, ear pain, drainage, aches and pains, headaches, sinus pain and pressure.  She reports gradual improvement in upper respiratory symptoms but notes she is still feeling fatigued even after getting  a restful night's sleep.    Reports constipation that is not responding well to stool softener as well as bloating/gas with every meal She reports she is eating regular food now but is still having burping/ bloating  Reports she is having regular bowel movements but states they are not complete and she still feels as though she is constipated Reports she is taking OTC Kolace and consumes a predominantly plant-based diet.     Past Medical History:  Diagnosis Date   Basal cell carcinoma (BCC) of jawline 06/26/2018   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HCC)    Genital herpes    Hypothyroidism    Seasonal allergies    Syncope and collapse    Thyroid disease    hypothyroidism    Past Surgical History:  Procedure Laterality Date   ABDOMINAL HYSTERECTOMY     ANTERIOR CRUCIATE LIGAMENT REPAIR  Right 2012   ARTHROSCOPIC REPAIR ACL  2013   Right knee   AUGMENTATION MAMMAPLASTY Bilateral 2000   saline   BLADDER REPAIR     BREAST ENHANCEMENT SURGERY  1995   COLONOSCOPY WITH PROPOFOL N/A 08/29/2017   Procedure: COLONOSCOPY WITH PROPOFOL;  Surgeon: Wyline Mood, MD;  Location: [facility] ENDOSCOPY;  Service: Gastroenterology;  Laterality: N/A;   CYSTOCELE REPAIR  2013   ENDOMETRIAL ABLATION  2013   NASAL SEPTUM SURGERY  1987, 1991, 2001   PARTIAL HYSTERECTOMY  2015   PARTIAL HYSTERECTOMY Bilateral 2014   RECTOPEXY  2011   TONSILECTOMY, ADENOIDECTOMY, BILATERAL MYRINGOTOMY AND TUBES  1984   TOOTH EXTRACTION      Family History  Problem Relation Age of Onset   Pulmonary fibrosis Mother    Healthy Sister    Healthy Brother    Healthy Sister    Lung cancer Paternal Grandmother    Arrhythmia Father    Heart disease Father    Prostate cancer Neg Hx    Bladder Cancer Neg Hx    Kidney cancer Neg Hx    Breast cancer Neg Hx     Social History   Socioeconomic History   Marital status: Divorced    Spouse name: Not on file   Number of children: Not on file   Years of education: Not on file   Highest education level: Not on file  Occupational History  Not on file  Tobacco Use   Smoking status: Former    Types: Cigarettes    Quit date: 05/15/2015    Years since quitting: 6.4   Smokeless tobacco: Never  Vaping Use   Vaping Use: Never used  Substance and Sexual Activity   Alcohol use: No    Alcohol/week: 0.0 standard drinks   Drug use: No   Sexual activity: Not Currently    Partners: Male    Birth control/protection: Surgical  Other Topics Concern   Not on file  Social History Narrative   Not on file   Social Determinants of Health   Financial Resource Strain: Not on file  Food Insecurity: Not on file  Transportation Needs: Not on file  Physical Activity: Not on file  Stress: Not on file  Social Connections: Not on file  Intimate Partner Violence: Not on file     Outpatient Medications Prior to Visit  Medication Sig Dispense Refill   azelastine (ASTELIN) 0.1 % nasal spray Place into the nose.     cetirizine (ZYRTEC) 5 MG tablet Take 5 mg by mouth daily.     cyclobenzaprine (FLEXERIL) 5 MG tablet TAKE 1 TO 2 TABLETS BY MOUTH AT BEDTIME 30 tablet 5   estradiol (VIVELLE-DOT) 0.0375 MG/24HR Place 1 patch onto the skin 2 (two) times a week. 8 patch 12   estradiol (VIVELLE-DOT) 0.05 MG/24HR patch Place 1 patch (0.05 mg total) onto the skin 2 (two) times a week for 13 days. 4 patch 0   Eszopiclone 3 MG TABS TAKE 1 TABLET(3 MG) BY MOUTH AT BEDTIME 90 tablet 0   levocetirizine (XYZAL) 5 MG tablet Alternates with zyrtec     levothyroxine (SYNTHROID) 25 MCG tablet Take 1 tablet (25 mcg total) by mouth daily before breakfast. 90 tablet 3   levothyroxine (SYNTHROID) 50 MCG tablet TAKE 1 TABLET(50 MCG) BY MOUTH DAILY 90 tablet 3   mirabegron ER (MYRBETRIQ) 50 MG TB24 tablet Take 1 tablet (50 mg total) by mouth daily. 30 tablet 5   predniSONE (STERAPRED UNI-PAK 21 TAB) 5 MG (21) TBPK tablet TAKE AS DIRECTED ON PACKAGE 21 each 0   sertraline (ZOLOFT) 50 MG tablet Take 1 tablet (50 mg total) by mouth daily. 90 tablet 3   triamcinolone cream (KENALOG) 0.1 % Apply 1 application topically 2 (two) times daily. 30 g 0   No facility-administered medications prior to visit.    Allergies  Allergen Reactions   Cefaclor Anaphylaxis   Cephalosporins Anaphylaxis   Lubiprostone     Other reaction(s): Angioedema Tongue Swelling x 2 times.  Other reaction(s): Angioedema Tongue Swelling x 2 times.    Celecoxib    Lac Bovis     Other reaction(s): Other (See Comments) GI Upset to milk protein   Apple Nausea And Vomiting   Pseudoephedrine Palpitations    Tachycardia    Review of Systems  Constitutional:  Positive for chills, diaphoresis and fatigue. Negative for appetite change, fever and unexpected weight change.  HENT:  Positive for congestion, ear pain, postnasal  drip, sinus pressure, sinus pain and voice change. Negative for dental problem, drooling, ear discharge, facial swelling, hearing loss, mouth sores, nosebleeds, rhinorrhea, sneezing, sore throat, tinnitus and trouble swallowing.   Eyes:  Positive for itching. Negative for photophobia, pain, discharge, redness and visual disturbance.  Respiratory:  Negative for cough (non productive), shortness of breath and wheezing.   Cardiovascular:  Negative for chest pain, palpitations and leg swelling.  Gastrointestinal:  Positive for abdominal distention,  abdominal pain and constipation. Negative for anal bleeding, blood in stool, diarrhea, nausea, rectal pain and vomiting.       Gas and belching  Musculoskeletal:  Positive for arthralgias, back pain, myalgias and neck pain. Negative for gait problem, joint swelling and neck stiffness.  Neurological:  Positive for headaches. Negative for dizziness and light-headedness.      Objective:    Physical Exam HENT:     Right Ear: Hearing normal. A middle ear effusion is present. Tympanic membrane is erythematous.     Left Ear: Hearing, tympanic membrane and ear canal normal.     Nose:     Right Sinus: No maxillary sinus tenderness or frontal sinus tenderness.     Left Sinus: No maxillary sinus tenderness or frontal sinus tenderness.     Mouth/Throat:     Mouth: Mucous membranes are moist.     Pharynx: Oropharynx is clear. Uvula midline. No pharyngeal swelling, oropharyngeal exudate, posterior oropharyngeal erythema or uvula swelling.     Tonsils: No tonsillar exudate or tonsillar abscesses.  Cardiovascular:     Rate and Rhythm: Normal rate and regular rhythm.     Pulses: Normal pulses.     Heart sounds: Normal heart sounds.  Pulmonary:     Effort: Pulmonary effort is normal.     Breath sounds: Normal breath sounds and air entry. No decreased breath sounds, wheezing, rhonchi or rales.  Abdominal:     General: Abdomen is flat. Bowel sounds are normal.      Palpations: Abdomen is soft.     Tenderness: There is no abdominal tenderness.  Musculoskeletal:     Right lower leg: No edema.     Left lower leg: No edema.  Neurological:     Mental Status: She is alert and oriented to person, place, and time.  Psychiatric:        Attention and Perception: Attention and perception normal.        Behavior: Behavior normal. Behavior is cooperative.    BP 101/65 (BP Location: Left Arm, Patient Position: Sitting, Cuff Size: Normal)    Pulse 70    Temp 98.2 F (36.8 C) (Oral)    Wt 132 lb (59.9 kg)    SpO2 100%    BMI 24.14 kg/m  Wt Readings from Last 3 Encounters:  11/06/21 132 lb (59.9 kg)  09/12/21 134 lb (60.8 kg)  09/05/21 134 lb (60.8 kg)    Health Maintenance Due  Topic Date Due   Pneumococcal Vaccine 37-68 Years old (2 - PCV) 07/29/2020   INFLUENZA VACCINE  05/28/2021   COVID-19 Vaccine (5 - Booster for Pfizer series) 06/08/2021    There are no preventive care reminders to display for this patient.   Lab Results  Component Value Date   TSH 2.120 06/13/2021   Lab Results  Component Value Date   WBC 5.4 06/13/2021   HGB 14.1 06/13/2021   HCT 42.4 06/13/2021   MCV 93 06/13/2021   PLT 296 06/13/2021   Lab Results  Component Value Date   NA 138 06/13/2021   K 4.2 06/13/2021   CO2 22 06/13/2021   GLUCOSE 81 06/13/2021   BUN 12 06/13/2021   CREATININE 0.74 06/13/2021   BILITOT 0.3 06/13/2021   ALKPHOS 68 06/13/2021   AST 17 06/13/2021   ALT 14 06/13/2021   PROT 6.8 06/13/2021   ALBUMIN 4.7 06/13/2021   CALCIUM 9.7 06/13/2021   ANIONGAP 7 04/11/2014   EGFR 95 06/13/2021   Lab Results  Component Value Date   CHOL 212 (H) 05/03/2021   Lab Results  Component Value Date   HDL 62 05/03/2021   Lab Results  Component Value Date   LDLCALC 131 (H) 05/03/2021   Lab Results  Component Value Date   TRIG 110 05/03/2021   Lab Results  Component Value Date   CHOLHDL 3.4 05/03/2021   Lab Results  Component Value Date    HGBA1C 5.2 04/28/2020       Assessment & Plan:   Problem List Items Addressed This Visit   None  1. Upper respiratory tract infection, unspecified type Visit with patient indicates symptoms congestion, fatigue, body aches, ear pain, headaches since Christmas congruent with acute URI that is likely viral in nature but possibility of flu and bacterial URI cannot be completely ruled out at this time.  Patient reports negative COVID testing at home Due to nature and duration of symptoms recommended treatment regimen is symptomatic relief and follow up if needed Discussed with patient the various viral and bacterial etiologies of current illness and appropriate course of treatment Discussed OTC medication options for multisymptom relief such as Dayquil/Nyquil, Theraflu, AlkaSeltzer, etc. Discussed return precautions if symptoms are not improving or worsen over next 5-7 days.  Discussed constipation symptoms and advised using Miralax instead of stool softener until bowel movements become more regular then continue supplementation with fiber.    No orders of the defined types were placed in this encounter.    The entirety of the information documented in the History of Present Illness, Review of Systems and Physical Exam were personally obtained by me. Portions of this information were initially documented by the CMA and reviewed by me for thoroughness and accuracy.   Nilton Lave E Ambar Raphael, PA-C

## 2021-11-16 ENCOUNTER — Ambulatory Visit: Payer: Managed Care, Other (non HMO) | Admitting: Obstetrics and Gynecology

## 2021-12-12 HISTORY — PX: BREAST IMPLANT REMOVAL: SUR1101

## 2021-12-22 ENCOUNTER — Encounter: Payer: Self-pay | Admitting: Radiology

## 2021-12-27 ENCOUNTER — Ambulatory Visit: Payer: Managed Care, Other (non HMO) | Admitting: Nurse Practitioner

## 2021-12-30 ENCOUNTER — Other Ambulatory Visit: Payer: Self-pay | Admitting: Internal Medicine

## 2021-12-30 DIAGNOSIS — G4700 Insomnia, unspecified: Secondary | ICD-10-CM

## 2022-01-01 NOTE — Telephone Encounter (Signed)
I am refusing this, not sure if she is even pt

## 2022-01-09 ENCOUNTER — Encounter: Payer: Self-pay | Admitting: Nurse Practitioner

## 2022-01-09 ENCOUNTER — Other Ambulatory Visit: Payer: Self-pay

## 2022-01-09 ENCOUNTER — Ambulatory Visit: Payer: Managed Care, Other (non HMO) | Admitting: Nurse Practitioner

## 2022-01-09 VITALS — BP 96/72 | HR 81 | Temp 98.4°F | Resp 16 | Ht 62.0 in | Wt 132.6 lb

## 2022-01-09 DIAGNOSIS — G4700 Insomnia, unspecified: Secondary | ICD-10-CM

## 2022-01-09 DIAGNOSIS — R0602 Shortness of breath: Secondary | ICD-10-CM

## 2022-01-09 DIAGNOSIS — F418 Other specified anxiety disorders: Secondary | ICD-10-CM | POA: Diagnosis not present

## 2022-01-09 MED ORDER — QUVIVIQ 50 MG PO TABS: 50.0000 mg | ORAL_TABLET | Freq: Every day | ORAL | 2 refills | Status: DC

## 2022-01-09 MED ORDER — ESZOPICLONE 3 MG PO TABS: ORAL_TABLET | ORAL | 0 refills | Status: DC

## 2022-01-09 NOTE — Progress Notes (Signed)
[facility] ?[address] ?[place], [place] [address] ? ?Internal MEDICINE  ?Office Visit Note ? ?Patient Name: Theresa Walton ? [id number]  ?[id number] ? ?Date of Service: 01/09/2022 ? ?Chief Complaint  ?Patient presents with  ? Follow-up  ?  SOB, pt notices it more in the evenings, doesn't feel like she is getting restful deep sleep  ? ? ?HPI ?Theresa Walton presents for follow-up visit for shortness of breath and sleep disturbance.  Patient does not have sleep apnea and has had sleep studies done in the past which have shown that she does not go into deep sleep but stays in the first couple of stages of sleep for most of the night.  The patient is easily awakened and has trouble falling back to sleep.  She has been taking Lunesta 3 mg at bedtime to help her sleep but she does not like to take this medication every night.  She has also started taking a time-released melatonin which has been helping.  Patient would like to try something other than Lunesta. ?She also reports that she has been feeling like her chest is a little tight and that she has some slight shortness of breath.  She had a spirometry done in office in February last year and would like to have one done again today.  Her spirometry last year was normal.  Her spirometry in office today was also normal and her numbers have actually improved so her breathing has improved according to today spirometry.  Patient reports that she is just worried.  She has had a few people she knows passed away from having lung problems or infections recently so she is worried about her own health and her lungs. ? ? ? ?Current Medication: ?Outpatient Encounter Medications as of 01/09/2022  ?Medication Sig  ? azelastine (ASTELIN) 0.1 % nasal spray Place into the nose.  ? cetirizine (ZYRTEC) 5 MG tablet Take 5 mg by mouth daily.  ? cyclobenzaprine (FLEXERIL) 5 MG tablet TAKE 1 TO 2 TABLETS BY MOUTH AT BEDTIME  ? Daridorexant HCl (QUVIVIQ) 50 MG TABS Take 50 mg by mouth at  bedtime.  ? estradiol (VIVELLE-DOT) 0.0375 MG/24HR Place 1 patch onto the skin 2 (two) times a week.  ? levocetirizine (XYZAL) 5 MG tablet Alternates with zyrtec  ? levothyroxine (SYNTHROID) 50 MCG tablet TAKE 1 TABLET(50 MCG) BY MOUTH DAILY  ? mirabegron ER (MYRBETRIQ) 50 MG TB24 tablet Take 1 tablet (50 mg total) by mouth daily.  ? sertraline (ZOLOFT) 50 MG tablet Take 1 tablet (50 mg total) by mouth daily.  ? triamcinolone cream (KENALOG) 0.1 % Apply 1 application topically 2 (two) times daily.  ? [DISCONTINUED] Eszopiclone 3 MG TABS TAKE 1 TABLET(3 MG) BY MOUTH AT BEDTIME  ? [DISCONTINUED] levothyroxine (SYNTHROID) 25 MCG tablet Take 1 tablet (25 mcg total) by mouth daily before breakfast.  ? [DISCONTINUED] predniSONE (STERAPRED UNI-PAK 21 TAB) 5 MG (21) TBPK tablet TAKE AS DIRECTED ON PACKAGE  ? Eszopiclone 3 MG TABS TAKE 1 TABLET(3 MG) BY MOUTH AT BEDTIME  ? [DISCONTINUED] estradiol (VIVELLE-DOT) 0.05 MG/24HR patch Place 1 patch (0.05 mg total) onto the skin 2 (two) times a week for 13 days.  ? ?No facility-administered encounter medications on file as of 01/09/2022.  ? ? ?Surgical History: ?Past Surgical History:  ?Procedure Laterality Date  ? ABDOMINAL HYSTERECTOMY    ? ANTERIOR CRUCIATE LIGAMENT REPAIR Right 2012  ? ARTHROSCOPIC REPAIR ACL  2013  ? Right knee  ? AUGMENTATION MAMMAPLASTY Bilateral 2000  ?  saline  ? BLADDER REPAIR    ? [place]  ? BREAST IMPLANT REMOVAL Bilateral 12/12/2021  ? COLONOSCOPY WITH PROPOFOL N/A 08/29/2017  ? Procedure: COLONOSCOPY WITH PROPOFOL;  Surgeon: Jonathon Bellows, MD;  Location: [facility] ENDOSCOPY;  Service: Gastroenterology;  Laterality: N/A;  ? CYSTOCELE REPAIR  2013  ? ENDOMETRIAL ABLATION  2013  ? [place], 2001  ? PARTIAL HYSTERECTOMY  2015  ? PARTIAL HYSTERECTOMY Bilateral 2014  ? RECTOPEXY  2011  ? TONSILECTOMY, ADENOIDECTOMY, BILATERAL MYRINGOTOMY AND TUBES  1984  ? TOOTH EXTRACTION    ? ? ?Medical History: ?Past Medical  History:  ?Diagnosis Date  ? Basal cell carcinoma (BCC) of jawline 06/26/2018  ? 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[place])   ? Genital herpes   ? Hypothyroidism   ? Seasonal allergies   ? Syncope and collapse   ? Thyroid disease   ? hypothyroidism  ? ? ?Family History: ?Family History  ?Problem Relation Age of Onset  ? Pulmonary fibrosis Mother   ? Healthy Sister   ? Healthy Brother   ? Healthy Sister   ? Lung cancer Paternal Grandmother   ? Arrhythmia Father   ? Heart disease Father   ? Prostate cancer Neg Hx   ? Bladder Cancer Neg Hx   ? Kidney cancer Neg Hx   ? Breast cancer Neg Hx   ? ? ?Social History  ? ?Socioeconomic History  ? Marital status: Divorced  ?  Spouse name: Not on file  ? Number of children: Not on file  ? Years of education: Not on file  ? Highest education level: Not on file  ?Occupational History  ? Not on file  ?Tobacco Use  ? Smoking status: Former  ?  Types: Cigarettes  ?  Quit date: 05/15/2015  ?  Years since quitting: 6.6  ? Smokeless tobacco: Never  ?Vaping Use  ? Vaping Use: Never used  ?Substance and Sexual Activity  ? Alcohol use: No  ?  Alcohol/week: 0.0 standard drinks  ? Drug use: No  ? Sexual activity: Not Currently  ?  Partners: Male  ?  Birth control/protection: Surgical  ?Other Topics Concern  ? Not on file  ?Social History Narrative  ? Not on file  ? ?Social Determinants of Health  ? ?Financial Resource Strain: Not on file  ?Food Insecurity: Not on file  ?Transportation Needs: Not on file  ?Physical Activity: Not on file  ?Stress: Not on file  ?Social Connections: Not on file  ?Intimate Partner Violence: Not on file  ? ? ? ? ?Review of Systems  ?Constitutional:  Positive for fatigue. Negative for chills and unexpected weight change.  ?HENT:  Positive for congestion and postnasal drip. Negative for rhinorrhea, sneezing and sore throat.   ?Eyes:  Negative for redness.  ?Respiratory:  Positive for chest tightness. Negative for cough, shortness of breath and wheezing.   ?Cardiovascular:  Negative.  Negative for chest pain and palpitations.  ?Gastrointestinal:  Negative for abdominal pain, constipation, diarrhea, nausea and vomiting.  ?Genitourinary:  Negative for dysuria and frequency.  ?Musculoskeletal:  Negative for arthralgias, back pain, joint swelling and neck pain.  ?Skin:  Negative for rash.  ?Neurological: Negative.  Negative for tremors and numbness.  ?Hematological:  Negative for adenopathy. Does not bruise/bleed easily.  ?Psychiatric/Behavioral:  Negative for behavioral problems (Depression), sleep disturbance and suicidal ideas. The patient is not nervous/anxious.   ? ?Vital Signs: ?BP 96/72   Pulse 81  Temp 98.4 ?F (36.9 ?C)   Resp 16   Ht '5\' 2"'$  (1.575 m)   Wt 132 lb 9.6 oz (60.1 kg)   SpO2 99%   BMI 24.25 kg/m?  ? ? ?Physical Exam ?Vitals reviewed.  ?Constitutional:   ?   General: She is not in acute distress. ?   Appearance: Normal appearance. She is normal weight. She is not ill-appearing.  ?HENT:  ?   Head: Normocephalic and atraumatic.  ?Cardiovascular:  ?   Rate and Rhythm: Normal rate and regular rhythm.  ?   Heart sounds: Normal heart sounds. No murmur heard. ?Pulmonary:  ?   Effort: Pulmonary effort is normal. No respiratory distress.  ?   Breath sounds: Normal breath sounds. No wheezing.  ?Neurological:  ?   Mental Status: She is alert and oriented to person, place, and time.  ?Psychiatric:     ?   Mood and Affect: Mood normal.     ?   Behavior: Behavior normal.  ? ? ? ? ? ?Assessment/Plan: ?1. SOB (shortness of breath) ?Spirometry was done in office today and was normal and even improved when compared to February 2022. SOB is mild and is accompanied by chest tightness, this could be a physical response to anxiety.  ?- PR BREATHING CAPACITY TEST ? ?2. Insomnia, unspecified type ?Patient wants to try a different sleep medication. Patient agreed to try quviviq. This medication comes through a mail order pharmacy. Patient is out of [place] and will continue to use lunesta  until she received quviviq in the mail. 1 refill of lunesta sent to her pharmacy. Patient acknowledges that she cannot take both medications together and that she will stop taking lunesta when she starts taking quviviq

## 2022-01-31 ENCOUNTER — Other Ambulatory Visit: Payer: Self-pay | Admitting: *Deleted

## 2022-01-31 DIAGNOSIS — N959 Unspecified menopausal and perimenopausal disorder: Secondary | ICD-10-CM

## 2022-01-31 NOTE — Telephone Encounter (Signed)
erroneous

## 2022-02-07 ENCOUNTER — Encounter: Payer: Self-pay | Admitting: Obstetrics and Gynecology

## 2022-02-07 ENCOUNTER — Ambulatory Visit (INDEPENDENT_AMBULATORY_CARE_PROVIDER_SITE_OTHER): Payer: Managed Care, Other (non HMO) | Admitting: Obstetrics and Gynecology

## 2022-02-07 ENCOUNTER — Encounter: Payer: Self-pay | Admitting: Nurse Practitioner

## 2022-02-07 ENCOUNTER — Ambulatory Visit: Payer: Managed Care, Other (non HMO) | Admitting: Nurse Practitioner

## 2022-02-07 VITALS — BP 109/73 | HR 72 | Temp 98.2°F | Resp 16 | Ht 62.0 in | Wt 132.4 lb

## 2022-02-07 VITALS — BP 108/74 | HR 73

## 2022-02-07 DIAGNOSIS — G4700 Insomnia, unspecified: Secondary | ICD-10-CM | POA: Diagnosis not present

## 2022-02-07 DIAGNOSIS — R0602 Shortness of breath: Secondary | ICD-10-CM

## 2022-02-07 DIAGNOSIS — N3281 Overactive bladder: Secondary | ICD-10-CM | POA: Diagnosis not present

## 2022-02-07 MED ORDER — MIRABEGRON ER 50 MG PO TB24: 50.0000 mg | ORAL_TABLET | Freq: Every day | ORAL | 3 refills | Status: DC

## 2022-02-07 MED ORDER — QUVIVIQ 25 MG PO TABS: 25.0000 mg | ORAL_TABLET | Freq: Every evening | ORAL | 2 refills | Status: DC | PRN

## 2022-02-07 NOTE — Progress Notes (Signed)
[facility] ?Return Visit ? ?SUBJECTIVE  ?History of Present Illness: ?Theresa Walton is a 56 y.o. female seen in follow-up for OAB. Plan at last visit was to start Myrbetriq '50mg'$ .  ? ?Down to 6-7 times per day urination from 8-10 times per day. Urgency sensation has diminished as well. Has not been getting up at night. Tries to stop drinking fluids after dinner.  ? ?Past Medical History: ?Patient  has a past medical history of Basal cell carcinoma (BCC) of jawline (06/26/2018), 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[place]), Genital herpes, Hypothyroidism, Seasonal allergies, Syncope and collapse, and Thyroid disease.  ? ?Past Surgical History: ?She  has a past surgical history that includes Tonsilectomy, adenoidectomy, bilateral myringotomy and tubes (1984); Nasal septum surgery (1987, 1991, 2001); Breast enhancement surgery (1995); Rectopexy (2011); Cystocele repair (2013); Endometrial ablation (2013); Arthroscopic repair ACL (2013); Partial hysterectomy (2015); Abdominal hysterectomy; Partial hysterectomy (Bilateral, 2014); Anterior cruciate ligament repair (Right, 2012); Bladder repair; Augmentation mammaplasty (Bilateral, 2000); Colonoscopy with propofol (N/A, 08/29/2017); Tooth extraction; and Breast implant removal (Bilateral, 12/12/2021).  ? ?Medications: ?She has a current medication list which includes the following prescription(s): azelastine, cetirizine, cyclobenzaprine, quviviq, estradiol, eszopiclone, levocetirizine, levothyroxine, mirabegron er, sertraline, and triamcinolone cream.  ? ?Allergies: ?Patient is allergic to cefaclor, cephalosporins, lubiprostone, celecoxib, lac bovis, apple juice, and pseudoephedrine.  ? ?Social History: ?Patient  reports that she quit smoking about 6 years ago. Her smoking use included cigarettes. She has never used smokeless tobacco. She reports that she does not drink alcohol and does not use drugs.  ?  ?  ?OBJECTIVE  ?  ? ?Physical Exam: ?Vitals:  ? 02/07/22 1046   ?BP: 108/74  ?Pulse: 73  ? ?Gen: No apparent distress, A&O x 3. ? ?Detailed Urogynecologic Evaluation:  ?Deferred.  ? ?ASSESSMENT AND PLAN  ?  ?Theresa Walton is a 56 y.o. with:  ?1. Overactive bladder   ? ?-Continue with Myrbetriq '50mg'$ . 90 day prescription refilled.  ? ?Return 9 months or sooner if needed ? ?Theresa Folds, MD ? ?Time spent: I spent 20 minutes dedicated to the care of this patient on the date of this encounter to include pre-visit review of records, face-to-face time with the patient and post visit documentation and ordering medication/ testing.  ? ? ?

## 2022-02-07 NOTE — Progress Notes (Signed)
[facility] ?[address] ?[place], [place] [address] ? ?Internal MEDICINE  ?Office Visit Note ? ?Patient Name: Theresa Walton ? [id number]  ?[id number] ? ?Date of Service: 02/07/2022 ? ?Chief Complaint  ?Patient presents with  ? Follow-up  ?  MAUQJFH  ? ? ?HPI ?Theresa Walton presents for a follow up visit for insomnia. Patient was started on quviviq 50 mg at bedtime. It has been working well and she is sleeping much better than when she was taking lunesta but the whole tablet of quviviq makes her sleepy the next morning. She states that she does well with a 1/2 tablet at night of quviviq.  ? ? ? ?Current Medication: ?Outpatient Encounter Medications as of 02/07/2022  ?Medication Sig  ? azelastine (ASTELIN) 0.1 % nasal spray Place into the nose.  ? cetirizine (ZYRTEC) 5 MG tablet Take 5 mg by mouth daily.  ? cyclobenzaprine (FLEXERIL) 5 MG tablet TAKE 1 TO 2 TABLETS BY MOUTH AT BEDTIME  ? Daridorexant HCl (QUVIVIQ) 25 MG TABS Take 25 mg by mouth at bedtime as needed.  ? estradiol (VIVELLE-DOT) 0.0375 MG/24HR Place 1 patch onto the skin 2 (two) times a week.  ? Eszopiclone 3 MG TABS TAKE 1 TABLET(3 MG) BY MOUTH AT BEDTIME  ? levocetirizine (XYZAL) 5 MG tablet Alternates with zyrtec  ? levothyroxine (SYNTHROID) 50 MCG tablet TAKE 1 TABLET(50 MCG) BY MOUTH DAILY  ? mirabegron ER (MYRBETRIQ) 50 MG TB24 tablet Take 1 tablet (50 mg total) by mouth daily.  ? sertraline (ZOLOFT) 50 MG tablet Take 1 tablet (50 mg total) by mouth daily.  ? triamcinolone cream (KENALOG) 0.1 % Apply 1 application topically 2 (two) times daily.  ? [DISCONTINUED] Daridorexant HCl (QUVIVIQ) 50 MG TABS Take 50 mg by mouth at bedtime.  ? ?No facility-administered encounter medications on file as of 02/07/2022.  ? ? ?Surgical History: ?Past Surgical History:  ?Procedure Laterality Date  ? ABDOMINAL HYSTERECTOMY    ? ANTERIOR CRUCIATE LIGAMENT REPAIR Right 2012  ? ARTHROSCOPIC REPAIR ACL  2013  ? Right knee  ? AUGMENTATION MAMMAPLASTY Bilateral 2000  ?  saline  ? BLADDER REPAIR    ? [place]  ? BREAST IMPLANT REMOVAL Bilateral 12/12/2021  ? COLONOSCOPY WITH PROPOFOL N/A 08/29/2017  ? Procedure: COLONOSCOPY WITH PROPOFOL;  Surgeon: Jonathon Bellows, MD;  Location: [facility] ENDOSCOPY;  Service: Gastroenterology;  Laterality: N/A;  ? CYSTOCELE REPAIR  2013  ? ENDOMETRIAL ABLATION  2013  ? [place], 2001  ? PARTIAL HYSTERECTOMY  2015  ? PARTIAL HYSTERECTOMY Bilateral 2014  ? RECTOPEXY  2011  ? TONSILECTOMY, ADENOIDECTOMY, BILATERAL MYRINGOTOMY AND TUBES  1984  ? TOOTH EXTRACTION    ? ? ?Medical History: ?Past Medical History:  ?Diagnosis Date  ? Basal cell carcinoma (BCC) of jawline 06/26/2018  ? 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 Genital herpes   ? Hypothyroidism   ? Seasonal allergies   ? Syncope and collapse   ? Thyroid disease   ? hypothyroidism  ? ? ?Family History: ?Family History  ?Problem Relation Age of Onset  ? Pulmonary fibrosis Mother   ? Healthy Sister   ? Healthy Brother   ? Healthy Sister   ? Lung cancer Paternal Grandmother   ? Arrhythmia Father   ? Heart disease Father   ? Prostate cancer Neg Hx   ? Bladder Cancer Neg Hx   ? Kidney cancer Neg Hx   ? Breast cancer Neg Hx   ? ? ?  Social History  ? ?Socioeconomic History  ? Marital status: Divorced  ?  Spouse name: Not on file  ? Number of children: Not on file  ? Years of education: Not on file  ? Highest education level: Not on file  ?Occupational History  ? Not on file  ?Tobacco Use  ? Smoking status: Former  ?  Types: Cigarettes  ?  Quit date: 05/15/2015  ?  Years since quitting: 6.7  ? Smokeless tobacco: Never  ?Vaping Use  ? Vaping Use: Never used  ?Substance and Sexual Activity  ? Alcohol use: No  ?  Alcohol/week: 0.0 standard drinks  ? Drug use: No  ? Sexual activity: Not Currently  ?  Partners: Male  ?  Birth control/protection: Surgical  ?Other Topics Concern  ? Not on file  ?Social History Narrative  ? Not on file  ? ?Social Determinants of Health   ? ?Financial Resource Strain: Not on file  ?Food Insecurity: Not on file  ?Transportation Needs: Not on file  ?Physical Activity: Not on file  ?Stress: Not on file  ?Social Connections: Not on file  ?Intimate Partner Violence: Not on file  ? ? ? ? ?Review of Systems  ?Constitutional:  Negative for chills, fatigue and unexpected weight change.  ?HENT:  Negative for rhinorrhea, sneezing and sore throat.   ?Respiratory:  Negative for cough, chest tightness, shortness of breath and wheezing.   ?Cardiovascular:  Negative for chest pain and palpitations.  ?Gastrointestinal:  Negative for abdominal pain, constipation, diarrhea, nausea and vomiting.  ?Musculoskeletal: Negative.   ?Neurological: Negative.   ?Psychiatric/Behavioral:  Behavioral problem: Depression.   ? ?Vital Signs: ?BP 109/73   Pulse 72   Temp 98.2 ?F (36.8 ?C)   Resp 16   Ht '5\' 2"'$  (1.575 m)   Wt 132 lb 6.4 oz (60.1 kg)   SpO2 99%   BMI 24.22 kg/m?  ? ? ?Physical Exam ?Vitals reviewed.  ?Constitutional:   ?   General: She is not in acute distress. ?   Appearance: Normal appearance. She is normal weight. She is not ill-appearing.  ?HENT:  ?   Head: Normocephalic and atraumatic.  ?Cardiovascular:  ?   Rate and Rhythm: Normal rate and regular rhythm.  ?Pulmonary:  ?   Effort: Pulmonary effort is normal. No respiratory distress.  ?Neurological:  ?   Mental Status: She is alert and oriented to person, place, and time.  ? ? ? ? ? ?Assessment/Plan: ?1. Mixed insomnia ?Quviviq dose decreased to 25 mg at night.  ?- Daridorexant HCl (QUVIVIQ) 25 MG TABS; Take 25 mg by mouth at bedtime as needed.  Dispense: 30 tablet; Refill: 2 ? ?2. SOB (shortness of breath) ?Doing well, no issues.  ? ? ?General Counseling: [name] verbalizes understanding of the findings of todays visit and agrees with plan of treatment. I have discussed any further diagnostic evaluation that may be needed or ordered today. We also reviewed her medications today. she has been encouraged to call  the office with any questions or concerns that should arise related to todays visit. ? ? ? ?No orders of the defined types were placed in this encounter. ? ? ?Meds ordered this encounter  ?Medications  ? Daridorexant HCl (QUVIVIQ) 25 MG TABS  ?  Sig: Take 25 mg by mouth at bedtime as needed.  ?  Dispense:  30 tablet  ?  Refill:  2  ? ? ?Return in about 6 months (around 08/09/2022) for F/U, pulmonary/sleep, Nawaal Alling PCP. ? ? ?  Total time spent:20 Minutes ?Time spent includes review of chart, medications, test results, and follow up plan with the patient.  ? ?[place] Controlled Substance Database was reviewed by me. ? ?This patient was seen by Jonetta Osgood, FNP-C in collaboration with Dr. Clayborn Bigness as a part of collaborative care agreement. ? ? ?Onica Davidovich R. Valetta Fuller, MSN, FNP-C ?Internal medicine  ?

## 2022-02-10 ENCOUNTER — Encounter: Payer: Self-pay | Admitting: Nurse Practitioner

## 2022-02-20 ENCOUNTER — Telehealth: Payer: Self-pay

## 2022-02-25 ENCOUNTER — Other Ambulatory Visit: Payer: Self-pay | Admitting: *Deleted

## 2022-02-25 DIAGNOSIS — N959 Unspecified menopausal and perimenopausal disorder: Secondary | ICD-10-CM

## 2022-02-25 MED ORDER — ESTRADIOL 0.0375 MG/24HR TD PTTW: 1.0000 | MEDICATED_PATCH | TRANSDERMAL | 6 refills | Status: DC

## 2022-02-27 ENCOUNTER — Encounter: Payer: Self-pay | Admitting: Family Medicine

## 2022-02-27 ENCOUNTER — Ambulatory Visit (INDEPENDENT_AMBULATORY_CARE_PROVIDER_SITE_OTHER): Payer: Managed Care, Other (non HMO) | Admitting: Family Medicine

## 2022-02-27 ENCOUNTER — Telehealth: Payer: Self-pay

## 2022-02-27 VITALS — BP 102/69 | HR 76 | Ht 62.0 in | Wt 132.0 lb

## 2022-02-27 DIAGNOSIS — Z1231 Encounter for screening mammogram for malignant neoplasm of breast: Secondary | ICD-10-CM | POA: Diagnosis not present

## 2022-02-27 DIAGNOSIS — R5383 Other fatigue: Secondary | ICD-10-CM | POA: Diagnosis not present

## 2022-02-27 DIAGNOSIS — Z124 Encounter for screening for malignant neoplasm of cervix: Secondary | ICD-10-CM | POA: Diagnosis not present

## 2022-02-27 DIAGNOSIS — Z01419 Encounter for gynecological examination (general) (routine) without abnormal findings: Secondary | ICD-10-CM

## 2022-02-27 DIAGNOSIS — N959 Unspecified menopausal and perimenopausal disorder: Secondary | ICD-10-CM | POA: Diagnosis not present

## 2022-02-27 NOTE — Assessment & Plan Note (Signed)
[id number] - Rx refill sent in 5/1 for 1 year. ?

## 2022-02-27 NOTE — Telephone Encounter (Signed)
Left message for pt to call the office back with any questions regarding her mammo appt that was scheduled. Left day and time as well as rescheduling number.  ?

## 2022-02-27 NOTE — Progress Notes (Signed)
Subjective:  ?  ? Theresa Walton is a 56 y.o. female and is here for a comprehensive physical exam. The patient reports problems - fatigue.  Wants some blood work. No recent tick bites. Tried to stop her sleeping pills and this did not help. Removed her breast implants this year. ? ? ?The following portions of the patient's history were reviewed and updated as appropriate: allergies, current medications, past family history, past medical history, past social history, past surgical history, and problem list. ? ?Review of Systems ?Pertinent items noted in HPI and remainder of comprehensive ROS otherwise negative.  ? ?Objective:  ? ? BP 102/69   Pulse 76   Ht '5\' 2"'$  (1.575 m)   Wt 132 lb (59.9 kg)   BMI 24.14 kg/m?  ?General appearance: alert, cooperative, and appears stated age ?Head: Normocephalic, without obvious abnormality, atraumatic ?Neck: no adenopathy, supple, symmetrical, trachea midline, and thyroid not enlarged, symmetric, no tenderness/mass/nodules ?Lungs: clear to auscultation bilaterally ?Breasts: normal appearance, no masses or tenderness ?Heart: regular rate and rhythm, S1, S2 normal, no murmur, click, rub or gallop ?Abdomen: soft, non-tender; bowel sounds normal; no masses,  no organomegaly ?Extremities: extremities normal, atraumatic, no cyanosis or edema ?Pulses: 2+ and symmetric ?Skin: Skin color, texture, turgor normal. No rashes or lesions ?Lymph nodes: Cervical, supraclavicular, and axillary nodes normal. ?Neurologic: Grossly normal  ?  ?Assessment:  ? ? Healthy female exam.    ?  ?Plan:  ? ?Problem List Items Addressed This Visit   ? ?  ? Unprioritized  ? Menopausal and perimenopausal disorder  ?  99214 - Rx refill sent in 5/1 for 1 year. ? ?  ?  ? ?Other Visit Diagnoses   ? ? Screening for malignant neoplasm of cervix    -  Primary  ? Encounter for gynecological examination without abnormal finding      ? Relevant Orders  ? Comprehensive metabolic panel  ? Hemoglobin A1c  ? Lipid panel  ?  Encounter for screening mammogram for malignant neoplasm of breast      ? [phone] - mammogram ordered  ? Relevant Orders  ? MM 3D SCREEN BREAST BILATERAL  ? Other fatigue      ? 99214--check labs  ? Relevant Orders  ? CBC  ? TSH  ? VITAMIN D 25 Hydroxy (Vit-D Deficiency, Fractures)  ? ?  ? ?Return in 1 year (on 02/28/2023). ? ?  ?See After Visit Summary for Counseling Recommendations  ? ?

## 2022-02-27 NOTE — Progress Notes (Signed)
Patient presents for Annual Exam.  ? ?Pt had Breast Implants removed 02/23 ?Mammogram: 08/02/21 WNL  ?Family Hx of Breast Cancer: None ? ? ?CC: Fatigue  ? ?

## 2022-02-28 LAB — VITAMIN D 25 HYDROXY (VIT D DEFICIENCY, FRACTURES): Vit D, 25-Hydroxy: 41.9 ng/mL (ref 30.0–100.0)

## 2022-02-28 LAB — CBC
Hematocrit: 40.8 % (ref 34.0–46.6)
Hemoglobin: 13.8 g/dL (ref 11.1–15.9)
MCH: 31.4 pg (ref 26.6–33.0)
MCHC: 33.8 g/dL (ref 31.5–35.7)
MCV: 93 fL (ref 79–97)
Platelets: 299 10*3/uL (ref 150–450)
RBC: 4.39 x10E6/uL (ref 3.77–5.28)
RDW: 11.8 % (ref 11.7–15.4)
WBC: 4.9 10*3/uL (ref 3.4–10.8)

## 2022-02-28 LAB — COMPREHENSIVE METABOLIC PANEL
ALT: 16 IU/L (ref 0–32)
AST: 15 IU/L (ref 0–40)
Albumin/Globulin Ratio: 2 (ref 1.2–2.2)
Albumin: 4.5 g/dL (ref 3.8–4.9)
Alkaline Phosphatase: 64 IU/L (ref 44–121)
BUN/Creatinine Ratio: 14 (ref 9–23)
BUN: 12 mg/dL (ref 6–24)
Bilirubin Total: <0.2 mg/dL (ref 0.0–1.2)
CO2: 24 mmol/L (ref 20–29)
Calcium: 9.7 mg/dL (ref 8.7–10.2)
Chloride: 102 mmol/L (ref 96–106)
Creatinine, Ser: 0.87 mg/dL (ref 0.57–1.00)
Globulin, Total: 2.3 g/dL (ref 1.5–4.5)
Glucose: 82 mg/dL (ref 70–99)
Potassium: 4 mmol/L (ref 3.5–5.2)
Sodium: 140 mmol/L (ref 134–144)
Total Protein: 6.8 g/dL (ref 6.0–8.5)
eGFR: 78 mL/min/{1.73_m2} (ref >59)

## 2022-02-28 LAB — LIPID PANEL
Chol/HDL Ratio: 4 ratio (ref 0.0–4.4)
Cholesterol, Total: 264 mg/dL — ABNORMAL HIGH (ref 100–199)
HDL: 66 mg/dL (ref >39)
LDL Chol Calc (NIH): 168 mg/dL — ABNORMAL HIGH (ref 0–99)
Triglycerides: 165 mg/dL — ABNORMAL HIGH (ref 0–149)
VLDL Cholesterol Cal: 30 mg/dL (ref 5–40)

## 2022-02-28 LAB — HEMOGLOBIN A1C
Est. average glucose Bld gHb Est-mCnc: 100 mg/dL
Hgb A1c MFr Bld: 5.1 % (ref 4.8–5.6)

## 2022-02-28 LAB — TSH: TSH: 1.83 u[IU]/mL (ref 0.450–4.500)

## 2022-03-01 ENCOUNTER — Encounter: Payer: Self-pay | Admitting: Physician Assistant

## 2022-03-01 ENCOUNTER — Ambulatory Visit: Payer: Managed Care, Other (non HMO) | Admitting: Physician Assistant

## 2022-03-01 VITALS — BP 98/65 | HR 80 | Temp 98.2°F | Resp 16 | Ht 62.0 in | Wt 134.1 lb

## 2022-03-01 DIAGNOSIS — R5383 Other fatigue: Secondary | ICD-10-CM | POA: Diagnosis not present

## 2022-03-01 NOTE — Progress Notes (Signed)
?  ? ?I,[name],acting as a scribe for [employer], PA-C.,have documented all relevant documentation on the behalf of [place], PA-C,as directed by  [employer], PA-C while in the presence of [name] E [name], PA-C.  ? ?Established patient visit ? ? ?Patient: Theresa Walton   DOB: 02/29/1966   56 y.o. Female  MRN: [id number] ?Visit Date: 03/01/2022 ? ?Today's healthcare provider: Dani Gobble Miyu Fenderson, PA-C  ?Introduced myself to the patient as a Journalist, newspaper and provided education on APPs in clinical practice.  ? ?Chief Complaint  ?Patient presents with  ? Fatigue  ? ?Subjective  ?  ?HPI  ?Fatigue ? ?She reports new onset fatigue which she describes as a lack of energy, feeling exhausted, and feeling sleepy. It began a few weeks ago and occurs every day. It is described as marked and gradually worsening.  ? ?Associated symptoms: ?Yes arthralgias No bleeding  ?No melena No chest discomfort  ?No heart palpitations No heart racing  ? ?No dyspnea No feeling depressed  ?No feeling anxious or under stress No fevers  ?No loss of appetite No nausea  ?No vomiting Yes sleeping problems  ? ? ?Wt Readings from Last 3 Encounters:  ?03/01/22 134 lb 1.6 oz (60.8 kg)  ?02/27/22 132 lb (59.9 kg)  ?02/07/22 132 lb 6.4 oz (60.1 kg)  ?  ?Lab Results  ?Component Value Date  ? WBC 4.9 02/27/2022  ? HGB 13.8 02/27/2022  ? HCT 40.8 02/27/2022  ? MCV 93 02/27/2022  ? PLT 299 02/27/2022  ? ?Lab Results  ?Component Value Date  ? TSH 1.830 02/27/2022  ? Lab Results  ?Component Value Date  ? NA 140 02/27/2022  ? K 4.0 02/27/2022  ? CO2 24 02/27/2022  ? BUN 12 02/27/2022  ? CREATININE 0.87 02/27/2022  ? CALCIUM 9.7 02/27/2022  ? GLUCOSE 82 02/27/2022  ?  ? ?---------------------------------------------------------------------------------------------------  ? ?States she has been "exhausted for weeks" ?Sleep: maybe 7-8 hours, admits to following proper sleep hygiene, States she works for a few hours then needs a nap before she can return to  work ?States she sleeps through the night without waking up but has a lot of "stress dreams" that seem to prevent restful sleep.  ? ?Has not started any new meds or changed diet ?States she has a lot of muscle fatigue and muscle aches - "kinda feels like mono" but she doesn't think she has been in a situation to contract this  ? ?She has a Social worker that she meets with regularly  ?Reports she has lost 3 people this year  ? ?She has undergone several sleep studies that have ruled out OSA and restless leg, reports that the studies showed she stays in a shallow level of sleep.  ? ? ?She states she has not been taking her Daridorexant or Eszopiclone at this time - she reports they work but she did not want to be dependent on them  ?If she does use them she usually cuts them in half  ? ?States she does not seem to have worsening symptoms after physical exertion ?Able to complete housework- doesn't feel weaker  ? ?She reports muscle fatigue and tightness in neck and shoulder  ? ?Reports she is having some difficulty when eating red meat and pork- GI upset, bloating, and burping ? ? ?Medications: ?Outpatient Medications Prior to Visit  ?Medication Sig  ? azelastine (ASTELIN) 0.1 % nasal spray Place into the nose.  ? cetirizine (ZYRTEC) 5 MG tablet Take  5 mg by mouth daily.  ? cyclobenzaprine (FLEXERIL) 5 MG tablet TAKE 1 TO 2 TABLETS BY MOUTH AT BEDTIME  ? Daridorexant HCl (QUVIVIQ) 25 MG TABS Take 25 mg by mouth at bedtime as needed.  ? estradiol (VIVELLE-DOT) 0.0375 MG/24HR Place 1 patch onto the skin 2 (two) times a week.  ? Eszopiclone 3 MG TABS TAKE 1 TABLET(3 MG) BY MOUTH AT BEDTIME  ? levocetirizine (XYZAL) 5 MG tablet Alternates with zyrtec  ? levothyroxine (SYNTHROID) 50 MCG tablet TAKE 1 TABLET(50 MCG) BY MOUTH DAILY  ? mirabegron ER (MYRBETRIQ) 50 MG TB24 tablet Take 1 tablet (50 mg total) by mouth daily.  ? sertraline (ZOLOFT) 50 MG tablet Take 1 tablet (50 mg total) by mouth daily.  ? triamcinolone cream  (KENALOG) 0.1 % Apply 1 application topically 2 (two) times daily.  ? ?No facility-administered medications prior to visit.  ? ? ?Review of Systems  ?Constitutional:  Positive for diaphoresis and fatigue.  ?Gastrointestinal:  Positive for constipation. Negative for diarrhea, nausea and vomiting.  ?Endocrine: Positive for cold intolerance and heat intolerance.  ?Musculoskeletal:  Positive for arthralgias, myalgias and neck pain.  ?Allergic/Immunologic: Positive for food allergies.  ?Neurological:  Positive for headaches. Negative for tremors, weakness and numbness.  ?Psychiatric/Behavioral:  Positive for confusion, decreased concentration and sleep disturbance. Negative for dysphoric mood. The patient is not nervous/anxious.   ? ? ?  Objective  ?  ?BP 98/65 (BP Location: Left Arm, Patient Position: Sitting, Cuff Size: Normal)   Pulse 80   Temp 98.2 ?F (36.8 ?C) (Oral)   Resp 16   Ht _0  (1.575 m)   Wt 134 lb 1.6 oz (60.8 kg)   BMI 24.53 kg/m?  ? ? ?Physical Exam ?Vitals reviewed.  ?Constitutional:   ?   General: She is awake.  ?   Appearance: Normal appearance. She is well-developed, well-groomed and normal weight.  ?HENT:  ?   Head: Normocephalic and atraumatic.  ?Eyes:  ?   General: Lids are normal.  ?   Conjunctiva/sclera: Conjunctivae normal.  ?   Pupils: Pupils are equal, round, and reactive to light.  ?Neck:  ?   Thyroid: No thyroid mass or thyromegaly.  ?Cardiovascular:  ?   Rate and Rhythm: Normal rate and regular rhythm.  ?   Pulses: Normal pulses.     ?     Radial pulses are 2+ on the right side and 2+ on the left side.  ?   Heart sounds: Normal heart sounds.  ?Pulmonary:  ?   Effort: Pulmonary effort is normal.  ?   Breath sounds: Normal breath sounds. No decreased breath sounds, wheezing or rhonchi.  ?Musculoskeletal:  ?   Right lower leg: No edema.  ?   Left lower leg: No edema.  ?Lymphadenopathy:  ?   Head:  ?   Right side of head: No submental or submandibular adenopathy.  ?   Left side of  head: No submental or submandibular adenopathy.  ?   Upper Body:  ?   Right upper body: No supraclavicular adenopathy.  ?   Left upper body: No supraclavicular adenopathy.  ?Neurological:  ?   General: No focal deficit present.  ?   Mental Status: She is alert and oriented to person, place, and time.  ?   GCS: GCS eye subscore is 4. GCS verbal subscore is 5. GCS motor subscore is 6.  ?Psychiatric:     ?   Attention and Perception: Attention and perception normal.     ?  Mood and Affect: Mood and affect normal.     ?   Speech: Speech normal.     ?   Behavior: Behavior normal. Behavior is cooperative.  ?  ? ? ?No results found for any visits on 03/01/22. ? Assessment & Plan  ?  ? ?Problem List Items Addressed This Visit   ? ?  ? Other  ? Fatigue - Primary  ?  Ongoing concern for several weeks ?States this is different from her typical fatigue and is accompanied by muscle aches, decreased concentration and intermittent confusion ?Previous labs performed 02/27/2022 were overall normal with the exception of cholesterol (elevated) ?Will order CRP, ESR, B12 and CK today  ?Will also place referral to Rheumatology for evaluation  ?Potential for chronic fatigue syndrome, fibromyalgia  ?Discussed sleep and ways to improve- discussed addition of Prazosin if she desires to assist with her vivid dreaming ?Provided medication information for her to review and discuss with PCP at later date.  ?Lab work results to further dictate management ?Follow up recommended in 6 weeks to discuss progress and rheumatology apt.  ? ? ?  ?  ? Relevant Orders  ? CK (Creatine Kinase)  ? B12 and Folate Panel  ? Sed Rate (ESR)  ? C-reactive protein  ? Ambulatory referral to Rheumatology  ? ? ? ?Return today (on 03/01/2022) for fatigue. ? ? ?I, Ceonna Frazzini E Soo Steelman, PA-C, have reviewed all documentation for this visit. The documentation on 03/01/22 for the exam, diagnosis, procedures, and orders are all accurate and complete. ? ? ?Tam Delisle, MHS,  PA-C ?[facility] ?[facility]  ? ? ?

## 2022-03-01 NOTE — Assessment & Plan Note (Signed)
Ongoing concern for several weeks ?States this is different from her typical fatigue and is accompanied by muscle aches, decreased concentration and intermittent confusion ?Previous labs performed 02/27/2022 were overall normal with the exception of cholesterol (elevated) ?Will order CRP, ESR, B12 and CK today  ?Will also place referral to Rheumatology for evaluation  ?Potential for chronic fatigue syndrome, fibromyalgia  ?Discussed sleep and ways to improve- discussed addition of Prazosin if she desires to assist with her vivid dreaming ?Provided medication information for her to review and discuss with PCP at later date.  ?Lab work results to further dictate management ?Follow up recommended in 6 weeks to discuss progress and rheumatology apt.  ? ?

## 2022-03-02 LAB — B12 AND FOLATE PANEL
Folate: 5.2 ng/mL (ref >3.0)
Vitamin B-12: 282 pg/mL (ref 232–1245)

## 2022-03-02 LAB — SEDIMENTATION RATE: Sed Rate: 2 mm/hr (ref 0–40)

## 2022-03-02 LAB — C-REACTIVE PROTEIN: CRP: <1 mg/L (ref 0–10)

## 2022-03-02 LAB — CK: Total CK: 205 U/L — ABNORMAL HIGH (ref 32–182)

## 2022-03-04 ENCOUNTER — Encounter: Payer: Self-pay | Admitting: Nurse Practitioner

## 2022-03-04 ENCOUNTER — Encounter: Payer: Self-pay | Admitting: Physician Assistant

## 2022-03-04 ENCOUNTER — Telehealth: Payer: Self-pay

## 2022-03-04 NOTE — Telephone Encounter (Signed)
Copied from [place] [id number]. Topic: General - Other ?>> Mar 04, 2022 12:06 PM Alanda Slim E wrote: ?Reason for CRM: Pt is calling to go over lab results and her CK level / please advise ?

## 2022-03-08 ENCOUNTER — Telehealth: Payer: Self-pay

## 2022-03-08 NOTE — Telephone Encounter (Signed)
Pt will call Theresa Walton and check if her insurance covers the [facility] and will call me back to let me know if a PA needs to be done ? ?

## 2022-03-14 ENCOUNTER — Other Ambulatory Visit: Payer: Self-pay | Admitting: Family Medicine

## 2022-03-14 DIAGNOSIS — N959 Unspecified menopausal and perimenopausal disorder: Secondary | ICD-10-CM

## 2022-03-19 ENCOUNTER — Telehealth: Payer: Managed Care, Other (non HMO) | Admitting: Nurse Practitioner

## 2022-03-19 DIAGNOSIS — J0140 Acute pansinusitis, unspecified: Secondary | ICD-10-CM | POA: Diagnosis not present

## 2022-03-19 MED ORDER — PREDNISONE 10 MG (21) PO TBPK: ORAL_TABLET | ORAL | 0 refills | Status: DC

## 2022-03-19 NOTE — Progress Notes (Signed)
Virtual Visit Consent   Theresa Walton, you are scheduled for a virtual visit with a [facility] provider today. Just as with appointments in the office, your consent must be obtained to participate. Your consent will be active for this visit and any virtual visit you may have with one of our providers in the next 365 days. If you have a MyChart account, a copy of this consent can be sent to you electronically.  As this is a virtual visit, video technology does not allow for your provider to perform a traditional examination. This may limit your provider's ability to fully assess your condition. If your provider identifies any concerns that need to be evaluated in person or the need to arrange testing (such as labs, EKG, etc.), we will make arrangements to do so. Although advances in technology are sophisticated, we cannot ensure that it will always work on either your end or our end. If the connection with a video visit is poor, the visit may have to be switched to a telephone visit. With either a video or telephone visit, we are not always able to ensure that we have a secure connection.  By engaging in this virtual visit, you consent to the provision of healthcare and authorize for your insurance to be billed (if applicable) for the services provided during this visit. Depending on your insurance coverage, you may receive a charge related to this service.  I need to obtain your verbal consent now. Are you willing to proceed with your visit today? Theresa Walton has provided verbal consent on 03/19/2022 for a virtual visit (video or telephone). Apolonio Schneiders, FNP  Date: 03/19/2022 1:57 PM  Virtual Visit via Video Note   I, Apolonio Schneiders, connected with  Theresa Walton  ([id number], Apr 15, 1966) on 03/19/22 at  2:15 PM EDT by a video-enabled telemedicine application and verified that I am speaking with the correct person using two identifiers.  Location: Patient: Virtual Visit Location Patient:  Home Provider: Virtual Visit Location Provider: Home Office   I discussed the limitations of evaluation and management by telemedicine and the availability of in person appointments. The patient expressed understanding and agreed to proceed.    History of Present Illness: Theresa Walton is a 56 y.o. who identifies as a female who was assigned female at birth, and is being seen today with complaints of sinus congestion that has progressed from her allergies.   She has been using her sinus rinses and oral antihistamine and added Mucinex   She has been on Levaquin for the past 5 days without relief.   She continues to have nasal congestion that is thick and she also has a cough and headaches  She has had three sinus surgeries in the past  She is under the care of an ENT but he is currently out of the [place] Her ENT is whom started her on the Levaquin      Problems:  Patient Active Problem List   Diagnosis Date Noted   Fatigue 03/01/2022   Situational anxiety 06/29/2021   Need for shingles vaccine 06/29/2021   Chronic fatigue 06/29/2021   Situational stress 06/29/2021   Pain in joint, lower leg 06/28/2021   Fatigue after COVID-19 vaccination 06/13/2021   Edema 08/05/2018   Impingement syndrome of shoulder region 08/05/2018   Knee joint effusion 08/05/2018   Old anterior cruciate ligament disruption 08/05/2018   Patellar tendonitis 08/05/2018   Basal cell carcinoma (BCC) of jawline 06/26/2018   History of pubovaginal sling 04/01/2017  Essential tremor 05/15/2016   Allergic rhinitis 06/22/2015   B12 deficiency 06/21/2015   H/O: hysterectomy 06/21/2015   Hypertriglyceridemia 06/21/2015   Hypoglycemia 06/21/2015   Menopausal and perimenopausal disorder 06/21/2015   Vasovagal symptom 06/21/2015   Insomnia 04/19/2015   Female stress incontinence 03/24/2012   Adult hypothyroidism 08/28/2009   Avitaminosis D 08/28/2009   Chronic infection of sinus 02/01/2009   Adaptation  reaction 09/09/2008   Genital herpes simplex type 1 infection 07/13/2008    Allergies:  Allergies  Allergen Reactions   Cefaclor Anaphylaxis   Cephalosporins Anaphylaxis   Lubiprostone     Other reaction(s): Angioedema Tongue Swelling x 2 times.  Other reaction(s): Angioedema Tongue Swelling x 2 times.    Celecoxib    Lac Bovis     Other reaction(s): Other (See Comments) GI Upset to milk protein   Apple Juice Nausea And Vomiting   Pseudoephedrine Palpitations    Tachycardia   Medications:  Current Outpatient Medications:    azelastine (ASTELIN) 0.1 % nasal spray, Place into the nose., Disp: , Rfl:    cetirizine (ZYRTEC) 5 MG tablet, Take 5 mg by mouth daily., Disp: , Rfl:    cyclobenzaprine (FLEXERIL) 5 MG tablet, TAKE 1 TO 2 TABLETS BY MOUTH AT BEDTIME, Disp: 30 tablet, Rfl: 5   Daridorexant HCl (QUVIVIQ) 25 MG TABS, Take 25 mg by mouth at bedtime as needed., Disp: 30 tablet, Rfl: 2   estradiol (VIVELLE-DOT) 0.0375 MG/24HR, Place 1 patch onto the skin 2 (two) times a week., Disp: 8 patch, Rfl: 6   Eszopiclone 3 MG TABS, TAKE 1 TABLET(3 MG) BY MOUTH AT BEDTIME, Disp: 30 tablet, Rfl: 0   levocetirizine (XYZAL) 5 MG tablet, Alternates with zyrtec, Disp: , Rfl:    levothyroxine (SYNTHROID) 50 MCG tablet, TAKE 1 TABLET(50 MCG) BY MOUTH DAILY, Disp: 90 tablet, Rfl: 3   mirabegron ER (MYRBETRIQ) 50 MG TB24 tablet, Take 1 tablet (50 mg total) by mouth daily., Disp: 90 tablet, Rfl: 3   sertraline (ZOLOFT) 50 MG tablet, Take 1 tablet (50 mg total) by mouth daily., Disp: 90 tablet, Rfl: 3   triamcinolone cream (KENALOG) 0.1 %, Apply 1 application topically 2 (two) times daily., Disp: 30 g, Rfl: 0  Observations/Objective: Patient is well-developed, well-nourished in no acute distress.  Resting comfortably  at home.  Head is normocephalic, atraumatic.  No labored breathing.  Speech is clear and coherent with logical content.  Patient is alert and oriented at baseline.    Assessment  and Plan: 1. Acute non-recurrent pansinusitis  - predniSONE (STERAPRED UNI-PAK 21 TAB) 10 MG (21) TBPK tablet; Take 6 tablets on day one, 5 on day two, 4 on day three, 3 on day four, 2 on day five, and 1 on day six. Take with food.  Dispense: 21 tablet; Refill: 0    Continue regimen per ENT and Mucinex OTC   Follow Up Instructions: I discussed the assessment and treatment plan with the patient. The patient was provided an opportunity to ask questions and all were answered. The patient agreed with the plan and demonstrated an understanding of the instructions.  A copy of instructions were sent to the patient via MyChart unless otherwise noted below.    The patient was advised to call back or seek an in-person evaluation if the symptoms worsen or if the condition fails to improve as anticipated.  Time:  I spent 15 minutes with the patient via telehealth technology discussing the above problems/concerns.    Apolonio Schneiders, FNP

## 2022-03-21 NOTE — Telephone Encounter (Signed)
Pt advised that she can ask primary to order

## 2022-03-31 ENCOUNTER — Other Ambulatory Visit: Payer: Self-pay | Admitting: Family Medicine

## 2022-03-31 DIAGNOSIS — N959 Unspecified menopausal and perimenopausal disorder: Secondary | ICD-10-CM

## 2022-04-01 ENCOUNTER — Other Ambulatory Visit: Payer: Self-pay | Admitting: Family Medicine

## 2022-04-01 DIAGNOSIS — N959 Unspecified menopausal and perimenopausal disorder: Secondary | ICD-10-CM

## 2022-04-04 ENCOUNTER — Other Ambulatory Visit: Payer: Self-pay | Admitting: *Deleted

## 2022-04-04 DIAGNOSIS — N959 Unspecified menopausal and perimenopausal disorder: Secondary | ICD-10-CM

## 2022-04-04 MED ORDER — ESTRADIOL 0.0375 MG/24HR TD PTTW: 1.0000 | MEDICATED_PATCH | TRANSDERMAL | 6 refills | Status: DC

## 2022-04-04 NOTE — Progress Notes (Signed)
Pt called requesting medication get sent to [facility]

## 2022-04-09 ENCOUNTER — Other Ambulatory Visit: Payer: Self-pay | Admitting: Family Medicine

## 2022-04-09 DIAGNOSIS — E039 Hypothyroidism, unspecified: Secondary | ICD-10-CM

## 2022-04-25 ENCOUNTER — Encounter: Payer: Self-pay | Admitting: Nurse Practitioner

## 2022-04-25 ENCOUNTER — Ambulatory Visit: Payer: Managed Care, Other (non HMO) | Admitting: Nurse Practitioner

## 2022-04-25 DIAGNOSIS — G4700 Insomnia, unspecified: Secondary | ICD-10-CM | POA: Diagnosis not present

## 2022-04-25 MED ORDER — ESZOPICLONE 3 MG PO TABS: ORAL_TABLET | ORAL | 2 refills | Status: DC

## 2022-04-25 NOTE — Progress Notes (Signed)
[facility] [place], [place] [address]  Internal MEDICINE  Office Visit Note  Patient Name: Theresa Walton  [id number]  [id number]  Date of Service: 04/25/2022  Chief Complaint  Patient presents with   Follow-up   Medication Problem    Quviviq causing grogginess    HPI Theresa Walton presents for a follow up visit for insomnia and difficulty sleeping. She has been taking quviviq. The 50 mg dose was too much for her and the 25 mg dose is too expensive. She was previously on lunesta 3 mg at bedtime which helped some. She has a new PCP now and labs were done but no specific cause of the fatigue and low energy was found.  In the past, she has also tried belsomra, trazodone, quetiapine and gabapentin.      Current Medication: Outpatient Encounter Medications as of 04/25/2022  Medication Sig   azelastine (ASTELIN) 0.1 % nasal spray Place into the nose.   cetirizine (ZYRTEC) 5 MG tablet Take 5 mg by mouth daily.   cyclobenzaprine (FLEXERIL) 5 MG tablet TAKE 1 TO 2 TABLETS BY MOUTH AT BEDTIME   estradiol (VIVELLE-DOT) 0.0375 MG/24HR Place 1 patch onto the skin 2 (two) times a week.   Eszopiclone 3 MG TABS TAKE 1 TABLET(3 MG) BY MOUTH AT BEDTIME   levocetirizine (XYZAL) 5 MG tablet Alternates with zyrtec   levothyroxine (SYNTHROID) 50 MCG tablet TAKE 1 TABLET(50 MCG) BY MOUTH DAILY   mirabegron ER (MYRBETRIQ) 50 MG TB24 tablet Take 1 tablet (50 mg total) by mouth daily.   predniSONE (STERAPRED UNI-PAK 21 TAB) 10 MG (21) TBPK tablet Take 6 tablets on day one, 5 on day two, 4 on day three, 3 on day four, 2 on day five, and 1 on day six. Take with food.   sertraline (ZOLOFT) 50 MG tablet Take 1 tablet (50 mg total) by mouth daily.   triamcinolone cream (KENALOG) 0.1 % Apply 1 application topically 2 (two) times daily.   [DISCONTINUED] Daridorexant HCl (QUVIVIQ) 25 MG TABS Take 25 mg by mouth at bedtime as needed.   [DISCONTINUED] Eszopiclone 3 MG TABS TAKE 1 TABLET(3 MG) BY  MOUTH AT BEDTIME   No facility-administered encounter medications on file as of 04/25/2022.    Surgical History: Past Surgical History:  Procedure Laterality Date   ABDOMINAL HYSTERECTOMY     ANTERIOR CRUCIATE LIGAMENT REPAIR Right 2012   ARTHROSCOPIC REPAIR ACL  2013   Right knee   AUGMENTATION MAMMAPLASTY Bilateral 2000   saline   BLADDER REPAIR     BREAST ENHANCEMENT SURGERY  1995   BREAST IMPLANT REMOVAL Bilateral 12/12/2021   COLONOSCOPY WITH PROPOFOL N/A 08/29/2017   Procedure: COLONOSCOPY WITH PROPOFOL;  Surgeon: Jonathon Bellows, MD;  Location: [facility] ENDOSCOPY;  Service: Gastroenterology;  Laterality: N/A;   CYSTOCELE REPAIR  2013   ENDOMETRIAL ABLATION  2013   NASAL SEPTUM SURGERY  1987, 1991, 2001   PARTIAL HYSTERECTOMY  2015   PARTIAL HYSTERECTOMY Bilateral 2014   RECTOPEXY  2011   TONSILECTOMY, ADENOIDECTOMY, BILATERAL MYRINGOTOMY AND TUBES  1984   TOOTH EXTRACTION      Medical History: Past Medical History:  Diagnosis Date   Basal cell carcinoma (BCC) of jawline 06/26/2018   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[place])    Genital herpes    Hypothyroidism    Seasonal allergies    Syncope and collapse    Thyroid disease    hypothyroidism    Family History: Family History  Problem Relation Age of Onset  Pulmonary fibrosis Mother    Healthy Sister    Healthy Brother    Healthy Sister    Lung cancer Paternal Grandmother    Arrhythmia Father    Heart disease Father    Prostate cancer Neg Hx    Bladder Cancer Neg Hx    Kidney cancer Neg Hx    Breast cancer Neg Hx     Social History   Socioeconomic History   Marital status: Divorced    Spouse name: Not on file   Number of children: Not on file   Years of education: Not on file   Highest education level: Not on file  Occupational History   Not on file  Tobacco Use   Smoking status: Former    Types: Cigarettes    Quit date: 05/15/2015    Years since quitting: 6.9   Smokeless tobacco: Never  Vaping Use    Vaping Use: Never used  Substance and Sexual Activity   Alcohol use: No    Alcohol/week: 0.0 standard drinks of alcohol   Drug use: No   Sexual activity: Not Currently    Partners: Male    Birth control/protection: Surgical  Other Topics Concern   Not on file  Social History Narrative   Not on file   Social Determinants of Health   Financial Resource Strain: Not on file  Food Insecurity: Not on file  Transportation Needs: Not on file  Physical Activity: Not on file  Stress: Not on file  Social Connections: Not on file  Intimate Partner Violence: Not on file      Review of Systems  Constitutional:  Negative for chills, fatigue and unexpected weight change.  HENT:  Negative for rhinorrhea, sneezing and sore throat.   Respiratory:  Negative for cough, chest tightness, shortness of breath and wheezing.   Cardiovascular:  Negative for chest pain and palpitations.  Gastrointestinal:  Negative for abdominal pain, constipation, diarrhea, nausea and vomiting.  Musculoskeletal: Negative.   Neurological: Negative.   Psychiatric/Behavioral:  Behavioral problem: Depression.     Vital Signs: BP 120/71   Pulse 75   Temp 97.6 F (36.4 C)   Resp 16   Ht '5\' 2"'$  (1.575 m)   Wt 131 lb 9.6 oz (59.7 kg)   SpO2 100%   BMI 24.07 kg/m    Physical Exam Vitals reviewed.  Constitutional:      General: She is not in acute distress.    Appearance: Normal appearance. She is normal weight. She is not ill-appearing.  HENT:     Head: Normocephalic and atraumatic.  Cardiovascular:     Rate and Rhythm: Normal rate and regular rhythm.  Pulmonary:     Effort: Pulmonary effort is normal. No respiratory distress.  Neurological:     Mental Status: She is alert and oriented to person, place, and time.        Assessment/Plan: 1. Insomnia, unspecified type Quviviq discontinued, restart lunesta, refills ordered. Follow up in 3 months.  - Eszopiclone 3 MG TABS; TAKE 1 TABLET(3 MG) BY MOUTH  AT BEDTIME  Dispense: 30 tablet; Refill: 2   General Counseling: Theresa Walton verbalizes understanding of the findings of todays visit and agrees with plan of treatment. I have discussed any further diagnostic evaluation that may be needed or ordered today. We also reviewed her medications today. she has been encouraged to call the office with any questions or concerns that should arise related to todays visit.    No orders of the defined types were  placed in this encounter.   Meds ordered this encounter  Medications   Eszopiclone 3 MG TABS    Sig: TAKE 1 TABLET(3 MG) BY MOUTH AT BEDTIME    Dispense:  30 tablet    Refill:  2    Return in about 3 months (around 07/26/2022) for F/U, med refill, Varonica Siharath for sleep.   Total time spent:20 Minutes Time spent includes review of chart, medications, test results, and follow up plan with the patient.   [place] Controlled Substance Database was reviewed by me.  This patient was seen by Jonetta Osgood, FNP-C in collaboration with Dr. Clayborn Bigness as a part of collaborative care agreement.   Ardean Simonich R. Valetta Fuller, MSN, FNP-C Internal medicine

## 2022-05-10 ENCOUNTER — Telehealth: Payer: Self-pay

## 2022-05-10 DIAGNOSIS — N3281 Overactive bladder: Secondary | ICD-10-CM

## 2022-05-10 MED ORDER — TROSPIUM CHLORIDE ER 60 MG PO CP24: 1.0000 | ORAL_CAPSULE | Freq: Every day | ORAL | 5 refills | Status: DC

## 2022-05-10 NOTE — Telephone Encounter (Signed)
Theresa Walton is a 56 y.o. female called in for an alternative for Myrbetriq. Pt said Myrbetriq is too expensive for a 90 day supply and the pharmacy said she can not change back to a 30day.   Message sent to Dr. Wannetta Sender for an alternative

## 2022-05-10 NOTE — Telephone Encounter (Signed)
Trospium '60mg'$  ER daily ordered.

## 2022-05-13 ENCOUNTER — Ambulatory Visit (INDEPENDENT_AMBULATORY_CARE_PROVIDER_SITE_OTHER): Payer: Managed Care, Other (non HMO)

## 2022-05-13 ENCOUNTER — Other Ambulatory Visit (HOSPITAL_COMMUNITY)
Admission: RE | Admit: 2022-05-13 | Discharge: 2022-05-13 | Disposition: A | Discharge status: Home / Self Care | Payer: Managed Care, Other (non HMO) | Attending: Obstetrics and Gynecology | Admitting: Obstetrics and Gynecology

## 2022-05-13 DIAGNOSIS — R350 Frequency of micturition: Secondary | ICD-10-CM | POA: Diagnosis not present

## 2022-05-13 LAB — POCT URINALYSIS DIPSTICK
Appearance: NORMAL
Bilirubin, UA: NEGATIVE
Blood, UA: NEGATIVE
Glucose, UA: NEGATIVE
Ketones, UA: NEGATIVE
Nitrite, UA: NEGATIVE
Protein, UA: NEGATIVE
Spec Grav, UA: 1.01 (ref 1.010–1.025)
Urobilinogen, UA: 0.2 E.U./dL
pH, UA: 6 (ref 5.0–8.0)

## 2022-05-13 MED ORDER — NITROFURANTOIN MONOHYD MACRO 100 MG PO CAPS: 100.0000 mg | ORAL_CAPSULE | Freq: Two times a day (BID) | ORAL | 0 refills | Status: DC

## 2022-05-13 MED ORDER — PHENAZOPYRIDINE HCL 200 MG PO TABS: 200.0000 mg | ORAL_TABLET | Freq: Three times a day (TID) | ORAL | 0 refills | Status: DC | PRN

## 2022-05-13 NOTE — Progress Notes (Signed)
Theresa Walton is a 56 y.o. female arrived today with UTI sx. Urinary irritation and vaginal itching. Pt said she tried AZO otc but it gave minimal relief. Pt said her urine has a strong odor. A urine specimen was collected and POCT Urine was done. POCT Urine was Negative.  Pt requesting an antibiotic for the urinary discomfort. Per Dr. Wannetta Sender protocol Macrobid has been sent to the pharmacy. Pt was notified the medication may change after re get the culture results back.

## 2022-05-13 NOTE — Telephone Encounter (Signed)
Pt has been called. She started the trospium last Friday. Pt said she believes she has a UTI so she is a but uncomfortable. Pt ask to come in to drop off a urine sample so we can send it for sulture. Pt will come in today around 12pm. Pt was scheduled for a rx f/u on 06/26/22 @ 1:40pm

## 2022-05-13 NOTE — Patient Instructions (Addendum)
Your Urine dip that was done in office was NEGATIVE. I am sending the urine off for culture and you can take AZO over the counter for your discomfort. Due to you being symptomatic Macrobid has also been sent to the pharmacy to take twice a day for 5 days. We will contact you when the results are back between 3-5 days. If you have any questions or concerns please feel free to call us at [phone]

## 2022-05-14 LAB — URINE CULTURE: Culture: NO GROWTH

## 2022-05-16 NOTE — Telephone Encounter (Signed)
Called patient with no answer. Will reach out again tomorrow.   Jaquita Folds, MD

## 2022-05-22 NOTE — Telephone Encounter (Signed)
If patient still having symptoms then she will need a follow up to discuss further.

## 2022-05-27 ENCOUNTER — Ambulatory Visit: Payer: Managed Care, Other (non HMO) | Admitting: Obstetrics and Gynecology

## 2022-05-31 ENCOUNTER — Ambulatory Visit: Payer: Managed Care, Other (non HMO) | Admitting: Obstetrics and Gynecology

## 2022-05-31 ENCOUNTER — Encounter: Payer: Self-pay | Admitting: Obstetrics and Gynecology

## 2022-05-31 ENCOUNTER — Other Ambulatory Visit (HOSPITAL_COMMUNITY)
Admission: RE | Admit: 2022-05-31 | Discharge: 2022-05-31 | Disposition: A | Discharge status: Home / Self Care | Payer: Managed Care, Other (non HMO) | Source: Ambulatory Visit | Attending: Obstetrics and Gynecology | Admitting: Obstetrics and Gynecology

## 2022-05-31 ENCOUNTER — Other Ambulatory Visit: Payer: Self-pay | Admitting: *Deleted

## 2022-05-31 VITALS — BP 107/73 | HR 84

## 2022-05-31 DIAGNOSIS — N3281 Overactive bladder: Secondary | ICD-10-CM | POA: Diagnosis not present

## 2022-05-31 DIAGNOSIS — N898 Other specified noninflammatory disorders of vagina: Secondary | ICD-10-CM | POA: Diagnosis present

## 2022-05-31 DIAGNOSIS — Z1231 Encounter for screening mammogram for malignant neoplasm of breast: Secondary | ICD-10-CM

## 2022-05-31 MED ORDER — ESTRADIOL 0.1 MG/GM VA CREA: TOPICAL_CREAM | VAGINAL | 11 refills | Status: DC

## 2022-05-31 NOTE — Progress Notes (Signed)
[facility] Return Visit  SUBJECTIVE  History of Present Illness: Theresa Walton is a 55 y.o. female seen in follow-up for OAB. Plan at last visit was to continue with Myrbetriq '50mg'$ .   Myrbetriq became too expensive. Trospium '60mg'$  ER was ordered. Urgency and frequency has decreased so she is overall happy. Denies side effects of dry mouth, dry eyes and constipation.   Patient felt that she maybe had a UTI but she gave a urine sample which returned negative on 05/13/22. She has been having a "pinching" feeling around her vulva. Has a new sexual partner. Has not been using condoms. Has been using coconut oil for lubricant.   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   Past Surgical History: She  has a past surgical history that includes Tonsilectomy, adenoidectomy, bilateral myringotomy and tubes (1984); Nasal septum surgery (1987, 1991, 2001); Breast enhancement surgery (1995); Rectopexy (2011); Cystocele repair (2013); Endometrial ablation (2013); Arthroscopic repair ACL (2013); Partial hysterectomy (2015); Abdominal hysterectomy; Partial hysterectomy (Bilateral, 2014); Anterior cruciate ligament repair (Right, 2012); Bladder repair; Augmentation mammaplasty (Bilateral, 2000); Colonoscopy with propofol (N/A, 08/29/2017); Tooth extraction; and Breast implant removal (Bilateral, 12/12/2021).   Medications: She has a current medication list which includes the following prescription(s): azelastine, cetirizine, cyclobenzaprine, [START ON 06/03/2022] estradiol, estradiol, eszopiclone, levocetirizine, levothyroxine, phenazopyridine, triamcinolone cream, and trospium chloride.   Allergies: Patient is allergic to cefaclor, cephalosporins, lubiprostone, celecoxib, milk (cow), apple juice, and  pseudoephedrine.   Social History: Patient  reports that she quit smoking about 7 years ago. Her smoking use included cigarettes. She has never used smokeless tobacco. She reports that she does not drink alcohol and does not use drugs.      OBJECTIVE     Physical Exam: Vitals:   05/31/22 0808  BP: 107/73  Pulse: 84    Gen: No apparent distress, A&O x 3.  Detailed Urogynecologic Evaluation:  Normal external genitalia, small abrasion noted at the introitus, atrophy and thinning of labia minora noted. On speculum, normal mucosa with minimal discharge present. Aptima swab obtained.    ASSESSMENT AND PLAN    Theresa Walton is a 56 y.o. with:  1. Overactive bladder   2. Vaginal irritation    - Continue with Trospium '60mg'$  ER for OAB symptoms.  - Start vaginal estrogen cream 0.5g nightly for two weeks then twice a week after. Can concentrate around the introitus/ vulva.  - Aptima swab obtained for yeast, BV, GC and chlamydia. Encouraged her to follow up with her general gynecologist for further STI testing since she has a new partner.   Return 1 year or sooner if needed  Jaquita Folds, MD

## 2022-05-31 NOTE — Patient Instructions (Signed)
Start vaginal estrogen therapy nightly for two weeks then 2 times weekly at night for treatment of vaginal atrophy (dryness of the vaginal tissues).  Please let us know if the prescription is too expensive and we can look for alternative options.   

## 2022-06-03 LAB — CERVICOVAGINAL ANCILLARY ONLY
Bacterial Vaginitis (gardnerella): POSITIVE — AB
Candida Glabrata: NEGATIVE
Candida Vaginitis: NEGATIVE
Chlamydia: NEGATIVE
Comment: NEGATIVE
Comment: NEGATIVE
Comment: NEGATIVE
Comment: NEGATIVE
Comment: NEGATIVE
Comment: NORMAL
Neisseria Gonorrhea: NEGATIVE
Trichomonas: NEGATIVE

## 2022-06-03 MED ORDER — METRONIDAZOLE 500 MG PO TABS: 500.0000 mg | ORAL_TABLET | Freq: Two times a day (BID) | ORAL | 0 refills | Status: AC

## 2022-06-03 NOTE — Addendum Note (Signed)
Addended by: Jaquita Folds on: 06/03/2022 03:00 PM   Modules accepted: Orders

## 2022-06-04 ENCOUNTER — Telehealth: Payer: Self-pay

## 2022-06-04 NOTE — Telephone Encounter (Signed)
-----   Message from Jaquita Folds, MD sent at 06/03/2022  3:00 PM EDT ----- Please let patient know she is positive for bacterial vaginosis and I have sent an antibiotic to her pharmacy.

## 2022-06-04 NOTE — Telephone Encounter (Signed)
Theresa Walton is a 56 y.o. female was contacted and notified of the message below.  Pt verbalized understanding and will take the prescribed medication. Pt wants to know if her partner needs to be treated?

## 2022-06-04 NOTE — Telephone Encounter (Signed)
Pt was notified.  

## 2022-06-26 ENCOUNTER — Ambulatory Visit: Payer: Managed Care, Other (non HMO) | Admitting: Obstetrics and Gynecology

## 2022-06-28 ENCOUNTER — Other Ambulatory Visit: Payer: Self-pay | Admitting: Family Medicine

## 2022-06-28 DIAGNOSIS — A6004 Herpesviral vulvovaginitis: Secondary | ICD-10-CM

## 2022-06-28 DIAGNOSIS — M62838 Other muscle spasm: Secondary | ICD-10-CM

## 2022-06-30 ENCOUNTER — Other Ambulatory Visit: Payer: Self-pay | Admitting: Internal Medicine

## 2022-06-30 ENCOUNTER — Other Ambulatory Visit: Payer: Self-pay | Admitting: Family Medicine

## 2022-06-30 DIAGNOSIS — G4700 Insomnia, unspecified: Secondary | ICD-10-CM

## 2022-06-30 DIAGNOSIS — M62838 Other muscle spasm: Secondary | ICD-10-CM

## 2022-07-02 ENCOUNTER — Other Ambulatory Visit: Payer: Self-pay | Admitting: Internal Medicine

## 2022-07-02 DIAGNOSIS — G4700 Insomnia, unspecified: Secondary | ICD-10-CM

## 2022-07-02 NOTE — Telephone Encounter (Signed)
LMOM advising pt to check pharmacy and 1 refill should be left at pharmacy

## 2022-07-29 ENCOUNTER — Other Ambulatory Visit: Payer: Self-pay | Admitting: *Deleted

## 2022-07-29 ENCOUNTER — Encounter: Payer: Self-pay | Admitting: Obstetrics and Gynecology

## 2022-07-29 DIAGNOSIS — N3281 Overactive bladder: Secondary | ICD-10-CM

## 2022-07-29 MED ORDER — VALACYCLOVIR HCL 1 G PO TABS: 1000.0000 mg | ORAL_TABLET | Freq: Every day | ORAL | 2 refills | Status: DC

## 2022-07-30 MED ORDER — TROSPIUM CHLORIDE ER 60 MG PO CP24: 1.0000 | ORAL_CAPSULE | Freq: Every day | ORAL | 2 refills | Status: DC

## 2022-08-05 ENCOUNTER — Ambulatory Visit: Payer: Managed Care, Other (non HMO)

## 2022-08-08 ENCOUNTER — Encounter: Payer: Self-pay | Admitting: Nurse Practitioner

## 2022-08-08 ENCOUNTER — Ambulatory Visit: Payer: Managed Care, Other (non HMO) | Admitting: Nurse Practitioner

## 2022-08-08 VITALS — BP 108/74 | HR 92 | Temp 97.0°F | Resp 16 | Ht 62.0 in | Wt 131.2 lb

## 2022-08-08 DIAGNOSIS — M1909 Primary osteoarthritis, other specified site: Secondary | ICD-10-CM

## 2022-08-08 DIAGNOSIS — R0602 Shortness of breath: Secondary | ICD-10-CM | POA: Diagnosis not present

## 2022-08-08 DIAGNOSIS — G4700 Insomnia, unspecified: Secondary | ICD-10-CM | POA: Diagnosis not present

## 2022-08-08 MED ORDER — BELSOMRA 5 MG PO TABS: 5.0000 mg | ORAL_TABLET | Freq: Every day | ORAL | 1 refills | Status: DC

## 2022-08-08 MED ORDER — DICLOFENAC SODIUM 75 MG PO TBEC: 75.0000 mg | DELAYED_RELEASE_TABLET | Freq: Two times a day (BID) | ORAL | 0 refills | Status: DC

## 2022-08-08 NOTE — Progress Notes (Signed)
[facility] [place], [place] [address]  Internal MEDICINE  Office Visit Note  Patient Name: Theresa Walton  [id number]  [id number]  Date of Service: 08/08/2022  Chief Complaint  Patient presents with   Follow-up    HPI Theresa Walton presents for a follow up visit for hypersomnia, insomnia and excessive daytime sleepiness. Need something for pain Slept well a couple of nights SOB recently, Check spiro, was ok.   Has tried sonata, [place], quviviq and trazodone.    Current Medication: Outpatient Encounter Medications as of 08/08/2022  Medication Sig   azelastine (ASTELIN) 0.1 % nasal spray Place into the nose.   cetirizine (ZYRTEC) 5 MG tablet Take 5 mg by mouth daily.   cyclobenzaprine (FLEXERIL) 5 MG tablet TAKE 1 TO 2 TABLETS BY MOUTH AT BEDTIME   diclofenac (VOLTAREN) 75 MG EC tablet Take 1 tablet (75 mg total) by mouth 2 (two) times daily.   estradiol (ESTRACE) 0.1 MG/GM vaginal cream Place 0.5g nightly for two weeks then twice a week after   estradiol (VIVELLE-DOT) 0.0375 MG/24HR Place 1 patch onto the skin 2 (two) times a week.   Eszopiclone 3 MG TABS TAKE 1 TABLET(3 MG) BY MOUTH AT BEDTIME   levocetirizine (XYZAL) 5 MG tablet Alternates with zyrtec   levothyroxine (SYNTHROID) 50 MCG tablet TAKE 1 TABLET(50 MCG) BY MOUTH DAILY   phenazopyridine (PYRIDIUM) 200 MG tablet Take 1 tablet (200 mg total) by mouth 3 (three) times daily as needed for pain.   Suvorexant (BELSOMRA) 5 MG TABS Take 5 mg by mouth at bedtime.   triamcinolone cream (KENALOG) 0.1 % Apply 1 application topically 2 (two) times daily.   Trospium Chloride 60 MG CP24 Take 1 capsule (60 mg total) by mouth daily.   valACYclovir (VALTREX) 1000 MG tablet Take 1 tablet (1,000 mg total) by mouth daily. Take for 5 days   No facility-administered encounter medications on file as of 08/08/2022.    Surgical History: Past Surgical History:  Procedure Laterality Date   ABDOMINAL HYSTERECTOMY      ANTERIOR CRUCIATE LIGAMENT REPAIR Right 2012   ARTHROSCOPIC REPAIR ACL  2013   Right knee   AUGMENTATION MAMMAPLASTY Bilateral 2000   saline   BLADDER REPAIR     BREAST ENHANCEMENT SURGERY  1995   BREAST IMPLANT REMOVAL Bilateral 12/12/2021   COLONOSCOPY WITH PROPOFOL N/A 08/29/2017   Procedure: COLONOSCOPY WITH PROPOFOL;  Surgeon: Jonathon Bellows, MD;  Location: [facility] ENDOSCOPY;  Service: Gastroenterology;  Laterality: N/A;   CYSTOCELE REPAIR  2013   ENDOMETRIAL ABLATION  2013   NASAL SEPTUM SURGERY  1987, 1991, 2001   PARTIAL HYSTERECTOMY  2015   PARTIAL HYSTERECTOMY Bilateral 2014   RECTOPEXY  2011   TONSILECTOMY, ADENOIDECTOMY, BILATERAL MYRINGOTOMY AND TUBES  1984   TOOTH EXTRACTION      Medical History: Past Medical History:  Diagnosis Date   Basal cell carcinoma (BCC) of jawline 06/26/2018   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[place])    Genital herpes    Hypothyroidism    Seasonal allergies    Syncope and collapse    Thyroid disease    hypothyroidism    Family History: Family History  Problem Relation Age of Onset   Pulmonary fibrosis Mother    Healthy Sister    Healthy Brother    Healthy Sister    Lung cancer Paternal Grandmother    Arrhythmia Father    Heart disease Father    Prostate cancer Neg Hx    Bladder Cancer Neg  Hx    Kidney cancer Neg Hx    Breast cancer Neg Hx     Social History   Socioeconomic History   Marital status: Divorced    Spouse name: Not on file   Number of children: Not on file   Years of education: Not on file   Highest education level: Not on file  Occupational History   Not on file  Tobacco Use   Smoking status: Former    Types: Cigarettes    Quit date: 05/15/2015    Years since quitting: 7.2   Smokeless tobacco: Never  Vaping Use   Vaping Use: Never used  Substance and Sexual Activity   Alcohol use: No    Alcohol/week: 0.0 standard drinks of alcohol   Drug use: No   Sexual activity: Not Currently    Partners: Male    Birth  control/protection: Surgical  Other Topics Concern   Not on file  Social History Narrative   Not on file   Social Determinants of Health   Financial Resource Strain: Not on file  Food Insecurity: Not on file  Transportation Needs: Not on file  Physical Activity: Not on file  Stress: Not on file  Social Connections: Not on file  Intimate Partner Violence: Not on file      Review of Systems  Constitutional:  Negative for chills, fatigue and unexpected weight change.  HENT:  Negative for rhinorrhea, sneezing and sore throat.   Respiratory:  Negative for cough, chest tightness, shortness of breath and wheezing.   Cardiovascular:  Negative for chest pain and palpitations.  Gastrointestinal:  Negative for abdominal pain, constipation, diarrhea, nausea and vomiting.  Musculoskeletal: Negative.   Neurological: Negative.   Psychiatric/Behavioral:  Behavioral problem: Depression.     Vital Signs: BP 108/74   Pulse 92   Temp (!) 97 F (36.1 C)   Resp 16   Ht '5\' 2"'$  (1.575 m)   Wt 131 lb 3.2 oz (59.5 kg)   SpO2 99%   BMI 24.00 kg/m    Physical Exam Vitals reviewed.  Constitutional:      General: She is not in acute distress.    Appearance: Normal appearance. She is normal weight. She is not ill-appearing.  HENT:     Head: Normocephalic and atraumatic.  Cardiovascular:     Rate and Rhythm: Normal rate and regular rhythm.  Pulmonary:     Effort: Pulmonary effort is normal. No respiratory distress.  Neurological:     Mental Status: She is alert and oriented to person, place, and time.        Assessment/Plan: 1. Mixed insomnia Try belsomra - Suvorexant (BELSOMRA) 5 MG TABS; Take 5 mg by mouth at bedtime.  Dispense: 30 tablet; Refill: 1  2. Primary osteoarthritis of other site Diclofenac to help with arthritis pains  - diclofenac (VOLTAREN) 75 MG EC tablet; Take 1 tablet (75 mg total) by mouth 2 (two) times daily.  Dispense: 60 tablet; Refill: 0  3. SOB (shortness  of breath) Arlyce Harman was normal - Spirometry with Graph   General Counseling: Theresa Walton verbalizes understanding of the findings of todays visit and agrees with plan of treatment. I have discussed any further diagnostic evaluation that may be needed or ordered today. We also reviewed her medications today. she has been encouraged to call the office with any questions or concerns that should arise related to todays visit.    Orders Placed This Encounter  Procedures   Spirometry with Graph  Meds ordered this encounter  Medications   diclofenac (VOLTAREN) 75 MG EC tablet    Sig: Take 1 tablet (75 mg total) by mouth 2 (two) times daily.    Dispense:  60 tablet    Refill:  0   Suvorexant (BELSOMRA) 5 MG TABS    Sig: Take 5 mg by mouth at bedtime.    Dispense:  30 tablet    Refill:  1    Please discontinue lunesta, fill new script asap    Return in about 1 month (around 09/08/2022) for F/U, pulmonary/sleep, Kehinde Totzke PCP, eval new med.   Total time spent:30 Minutes Time spent includes review of chart, medications, test results, and follow up plan with the patient.   [place] Controlled Substance Database was reviewed by me.  This patient was seen by Jonetta Osgood, FNP-C in collaboration with Dr. Clayborn Bigness as a part of collaborative care agreement.   Waniya Hoglund R. Valetta Fuller, MSN, FNP-C Internal medicine

## 2022-08-09 ENCOUNTER — Ambulatory Visit
Admission: RE | Admit: 2022-08-09 | Discharge: 2022-08-09 | Disposition: A | Discharge status: Home / Self Care | Payer: Managed Care, Other (non HMO) | Source: Ambulatory Visit | Attending: Family Medicine | Admitting: Family Medicine

## 2022-08-09 DIAGNOSIS — Z1231 Encounter for screening mammogram for malignant neoplasm of breast: Secondary | ICD-10-CM | POA: Insufficient documentation

## 2022-08-09 NOTE — Telephone Encounter (Signed)
Pt was called and notified that there was a 90 day supply ordered on 07/30/22

## 2022-08-19 ENCOUNTER — Encounter: Payer: Self-pay | Admitting: *Deleted

## 2022-08-24 ENCOUNTER — Other Ambulatory Visit: Payer: Self-pay | Admitting: Family Medicine

## 2022-08-24 DIAGNOSIS — M62838 Other muscle spasm: Secondary | ICD-10-CM

## 2022-08-26 NOTE — Telephone Encounter (Signed)
Requested medication (s) are due for refill today:   Provider to review  Requested medication (s) are on the active medication list:   Yes  Future visit scheduled:   No   Last ordered: 07/02/2022 #30, 0 refills  Non delegated refill   Requested Prescriptions  Pending Prescriptions Disp Refills   cyclobenzaprine (FLEXERIL) 5 MG tablet [Pharmacy Med Name: CYCLOBENZAPRINE '5MG'$  TABLETS] 30 tablet 0    Sig: TAKE 1 TO 2 TABLETS BY MOUTH AT BEDTIME     Not Delegated - Analgesics:  Muscle Relaxants Failed - 08/24/2022  4:21 PM      Failed - This refill cannot be delegated      Passed - Valid encounter within last 6 months    Recent Outpatient Visits           5 months ago Fatigue, unspecified type   [employer], [place], PA-C   9 months ago Upper respiratory tract infection, unspecified type   [employer], Dani Gobble, PA-C   1 year ago Chronic fatigue   [facility] Tally Joe T, FNP   1 year ago Fatigue after COVID-19 vaccination   [facility] Gwyneth Sprout, FNP   1 year ago Annual physical exam   [place], PA-C       Future Appointments             In 9 months Wannetta Sender, Governor Rooks, MD Urogynecology at [facility], [facility]

## 2022-09-09 ENCOUNTER — Encounter: Payer: Self-pay | Admitting: Nurse Practitioner

## 2022-09-09 ENCOUNTER — Ambulatory Visit (INDEPENDENT_AMBULATORY_CARE_PROVIDER_SITE_OTHER): Payer: Managed Care, Other (non HMO) | Admitting: Nurse Practitioner

## 2022-09-09 VITALS — BP 104/71 | HR 79 | Temp 97.1°F | Resp 16 | Ht 62.0 in | Wt 132.6 lb

## 2022-09-09 DIAGNOSIS — G4700 Insomnia, unspecified: Secondary | ICD-10-CM

## 2022-09-09 NOTE — Progress Notes (Cosign Needed)
[facility] [place], [place] [address]  Internal MEDICINE  Office Visit Note  Patient Name: Theresa Walton  [id number]  [id number]  Date of Service: 09/09/2022  Chief Complaint  Patient presents with   Follow-up    Follow up new med     HPI Theresa Walton presents for a follow up visit for insomnia Belsomra causes dry mouth,  Fell asleep without any meds, but sleepy during the day, taking naps.  Has tried many different medications including amitriptyline, belsomra, clonazepam, quviviq, diazepam, lunesta, melatonin, seroquel, and trazodone    Current Medication: Outpatient Encounter Medications as of 09/09/2022  Medication Sig   azelastine (ASTELIN) 0.1 % nasal spray Place into the nose.   cetirizine (ZYRTEC) 5 MG tablet Take 5 mg by mouth daily.   cyclobenzaprine (FLEXERIL) 5 MG tablet TAKE 1 TO 2 TABLETS BY MOUTH AT BEDTIME   diclofenac (VOLTAREN) 75 MG EC tablet Take 1 tablet (75 mg total) by mouth 2 (two) times daily.   estradiol (ESTRACE) 0.1 MG/GM vaginal cream Place 0.5g nightly for two weeks then twice a week after   estradiol (VIVELLE-DOT) 0.0375 MG/24HR Place 1 patch onto the skin 2 (two) times a week.   Eszopiclone 3 MG TABS TAKE 1 TABLET(3 MG) BY MOUTH AT BEDTIME   levocetirizine (XYZAL) 5 MG tablet Alternates with zyrtec   levothyroxine (SYNTHROID) 50 MCG tablet TAKE 1 TABLET(50 MCG) BY MOUTH DAILY   phenazopyridine (PYRIDIUM) 200 MG tablet Take 1 tablet (200 mg total) by mouth 3 (three) times daily as needed for pain.   Suvorexant (BELSOMRA) 5 MG TABS Take 5 mg by mouth at bedtime.   triamcinolone cream (KENALOG) 0.1 % Apply 1 application topically 2 (two) times daily.   Trospium Chloride 60 MG CP24 Take 1 capsule (60 mg total) by mouth daily.   valACYclovir (VALTREX) 1000 MG tablet Take 1 tablet (1,000 mg total) by mouth daily. Take for 5 days   No facility-administered encounter medications on file as of 09/09/2022.    Surgical  History: Past Surgical History:  Procedure Laterality Date   ABDOMINAL HYSTERECTOMY     ANTERIOR CRUCIATE LIGAMENT REPAIR Right 2012   ARTHROSCOPIC REPAIR ACL  2013   Right knee   BLADDER REPAIR     BREAST ENHANCEMENT SURGERY  1995   BREAST IMPLANT REMOVAL Bilateral 12/12/2021   COLONOSCOPY WITH PROPOFOL N/A 08/29/2017   Procedure: COLONOSCOPY WITH PROPOFOL;  Surgeon: Jonathon Bellows, MD;  Location: [facility] ENDOSCOPY;  Service: Gastroenterology;  Laterality: N/A;   CYSTOCELE REPAIR  2013   ENDOMETRIAL ABLATION  2013   NASAL SEPTUM SURGERY  1987, 1991, 2001   PARTIAL HYSTERECTOMY  2015   PARTIAL HYSTERECTOMY Bilateral 2014   RECTOPEXY  2011   TONSILECTOMY, ADENOIDECTOMY, BILATERAL MYRINGOTOMY AND TUBES  1984   TOOTH EXTRACTION      Medical History: Past Medical History:  Diagnosis Date   Basal cell carcinoma (BCC) of jawline 06/26/2018   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[place])    Genital herpes    Hypothyroidism    Seasonal allergies    Syncope and collapse    Thyroid disease    hypothyroidism    Family History: Family History  Problem Relation Age of Onset   Pulmonary fibrosis Mother    Healthy Sister    Healthy Brother    Healthy Sister    Lung cancer Paternal Grandmother    Arrhythmia Father    Heart disease Father    Prostate cancer Neg Hx  Bladder Cancer Neg Hx    Kidney cancer Neg Hx    Breast cancer Neg Hx     Social History   Socioeconomic History   Marital status: Divorced    Spouse name: Not on file   Number of children: Not on file   Years of education: Not on file   Highest education level: Not on file  Occupational History   Not on file  Tobacco Use   Smoking status: Former    Types: Cigarettes    Quit date: 05/15/2015    Years since quitting: 7.3   Smokeless tobacco: Never  Vaping Use   Vaping Use: Never used  Substance and Sexual Activity   Alcohol use: No    Alcohol/week: 0.0 standard drinks of alcohol   Drug use: No   Sexual activity: Not  Currently    Partners: Male    Birth control/protection: Surgical  Other Topics Concern   Not on file  Social History Narrative   Not on file   Social Determinants of Health   Financial Resource Strain: Not on file  Food Insecurity: Not on file  Transportation Needs: Not on file  Physical Activity: Not on file  Stress: Not on file  Social Connections: Not on file  Intimate Partner Violence: Not on file      Review of Systems  Constitutional:  Negative for chills, fatigue and unexpected weight change.  HENT:  Negative for rhinorrhea, sneezing and sore throat.   Respiratory:  Negative for cough, chest tightness, shortness of breath and wheezing.   Cardiovascular:  Negative for chest pain and palpitations.  Gastrointestinal:  Negative for abdominal pain, constipation, diarrhea, nausea and vomiting.  Musculoskeletal: Negative.   Neurological: Negative.   Psychiatric/Behavioral:  Behavioral problem: Depression.     Vital Signs: BP 104/71   Pulse 79   Temp (!) 97.1 F (36.2 C)   Resp 16   Ht '5\' 2"'$  (1.575 m)   Wt 132 lb 9.6 oz (60.1 kg)   SpO2 99%   BMI 24.25 kg/m    Physical Exam Vitals reviewed.  Constitutional:      General: She is not in acute distress.    Appearance: Normal appearance. She is normal weight. She is not ill-appearing.  HENT:     Head: Normocephalic and atraumatic.  Eyes:     Pupils: Pupils are equal, round, and reactive to light.  Cardiovascular:     Rate and Rhythm: Normal rate and regular rhythm.  Pulmonary:     Effort: Pulmonary effort is normal. No respiratory distress.  Neurological:     Mental Status: She is alert and oriented to person, place, and time.        Assessment/Plan: 1. Mixed insomnia Patient to see DSK for additional consultation as patient has tried many different sleep medications.    General Counseling: Theresa Walton verbalizes understanding of the findings of todays visit and agrees with plan of treatment. I have  discussed any further diagnostic evaluation that may be needed or ordered today. We also reviewed her medications today. she has been encouraged to call the office with any questions or concerns that should arise related to todays visit.    No orders of the defined types were placed in this encounter.   No orders of the defined types were placed in this encounter.   Return for F/U with DSK for sleep medicine and excessive daytime sleepiness.   Total time spent:20 Minutes Time spent includes review of chart, medications, test results,  and follow up plan with the patient.   [place] Controlled Substance Database was reviewed by me.  This patient was seen by Jonetta Osgood, FNP-C in collaboration with Dr. Clayborn Bigness as a part of collaborative care agreement.   Jolina Symonds R. Valetta Fuller, MSN, FNP-C Internal medicine

## 2022-09-12 ENCOUNTER — Ambulatory Visit (INDEPENDENT_AMBULATORY_CARE_PROVIDER_SITE_OTHER): Payer: Managed Care, Other (non HMO) | Admitting: Internal Medicine

## 2022-09-12 ENCOUNTER — Encounter: Payer: Self-pay | Admitting: Internal Medicine

## 2022-09-12 VITALS — BP 120/70 | HR 91 | Temp 97.8°F | Resp 16 | Ht 62.0 in | Wt 133.0 lb

## 2022-09-12 DIAGNOSIS — G4700 Insomnia, unspecified: Secondary | ICD-10-CM | POA: Diagnosis not present

## 2022-09-12 MED ORDER — ZALEPLON 10 MG PO CAPS: 10.0000 mg | ORAL_CAPSULE | Freq: Every evening | ORAL | 4 refills | Status: DC | PRN

## 2022-09-12 NOTE — Progress Notes (Signed)
[facility] [place], [place] [address]  Pulmonary Sleep Medicine   Office Visit Note  Patient Name: Theresa Walton DOB: 01/20/1966 MRN [id number]  Date of Service: 09/12/2022  Complaints/HPI: She is still having daytime sleepiness. States she has been having difficulty focusing. She states she was also started on gabapentin along with lunesta. She is having what appears to be due hangover effect. She states she was started on belsomra which dried her mouth so  basically she stopped taking all her meds.   ROS  General: (-) fever, (-) chills, (-) night sweats, (-) weakness Skin: (-) rashes, (-) itching,. Eyes: (-) visual changes, (-) redness, (-) itching. Nose and Sinuses: (-) nasal stuffiness or itchiness, (-) postnasal drip, (-) nosebleeds, (-) sinus trouble. Mouth and Throat: (-) sore throat, (-) hoarseness. Neck: (-) swollen glands, (-) enlarged thyroid, (-) neck pain. Respiratory: - cough, (-) bloody sputum, - shortness of breath, - wheezing. Cardiovascular: - ankle swelling, (-) chest pain. Lymphatic: (-) lymph node enlargement. Neurologic: (-) numbness, (-) tingling. Psychiatric: (-) anxiety, (-) depression   Current Medication: Outpatient Encounter Medications as of 09/12/2022  Medication Sig   azelastine (ASTELIN) 0.1 % nasal spray Place into the nose.   cetirizine (ZYRTEC) 5 MG tablet Take 5 mg by mouth daily.   cyclobenzaprine (FLEXERIL) 5 MG tablet TAKE 1 TO 2 TABLETS BY MOUTH AT BEDTIME   diclofenac (VOLTAREN) 75 MG EC tablet Take 1 tablet (75 mg total) by mouth 2 (two) times daily.   estradiol (ESTRACE) 0.1 MG/GM vaginal cream Place 0.5g nightly for two weeks then twice a week after   estradiol (VIVELLE-DOT) 0.0375 MG/24HR Place 1 patch onto the skin 2 (two) times a week.   Eszopiclone 3 MG TABS TAKE 1 TABLET(3 MG) BY MOUTH AT BEDTIME   levocetirizine (XYZAL) 5 MG tablet Alternates with zyrtec   levothyroxine (SYNTHROID) 50 MCG tablet  TAKE 1 TABLET(50 MCG) BY MOUTH DAILY   phenazopyridine (PYRIDIUM) 200 MG tablet Take 1 tablet (200 mg total) by mouth 3 (three) times daily as needed for pain.   Suvorexant (BELSOMRA) 5 MG TABS Take 5 mg by mouth at bedtime.   triamcinolone cream (KENALOG) 0.1 % Apply 1 application topically 2 (two) times daily.   Trospium Chloride 60 MG CP24 Take 1 capsule (60 mg total) by mouth daily.   valACYclovir (VALTREX) 1000 MG tablet Take 1 tablet (1,000 mg total) by mouth daily. Take for 5 days   No facility-administered encounter medications on file as of 09/12/2022.    Surgical History: Past Surgical History:  Procedure Laterality Date   ABDOMINAL HYSTERECTOMY     ANTERIOR CRUCIATE LIGAMENT REPAIR Right 2012   ARTHROSCOPIC REPAIR ACL  2013   Right knee   BLADDER REPAIR     BREAST ENHANCEMENT SURGERY  1995   BREAST IMPLANT REMOVAL Bilateral 12/12/2021   COLONOSCOPY WITH PROPOFOL N/A 08/29/2017   Procedure: COLONOSCOPY WITH PROPOFOL;  Surgeon: Jonathon Bellows, MD;  Location: [facility] ENDOSCOPY;  Service: Gastroenterology;  Laterality: N/A;   CYSTOCELE REPAIR  2013   ENDOMETRIAL ABLATION  2013   NASAL SEPTUM SURGERY  1987, 1991, 2001   PARTIAL HYSTERECTOMY  2015   PARTIAL HYSTERECTOMY Bilateral 2014   RECTOPEXY  2011   TONSILECTOMY, ADENOIDECTOMY, BILATERAL MYRINGOTOMY AND TUBES  1984   TOOTH EXTRACTION      Medical History: Past Medical History:  Diagnosis Date   Basal cell carcinoma (BCC) of jawline 06/26/2018   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[place])  Genital herpes    Hypothyroidism    Seasonal allergies    Syncope and collapse    Thyroid disease    hypothyroidism    Family History: Family History  Problem Relation Age of Onset   Pulmonary fibrosis Mother    Healthy Sister    Healthy Brother    Healthy Sister    Lung cancer Paternal Grandmother    Arrhythmia Father    Heart disease Father    Prostate cancer Neg Hx    Bladder Cancer Neg Hx    Kidney cancer Neg Hx    Breast  cancer Neg Hx     Social History: Social History   Socioeconomic History   Marital status: Divorced    Spouse name: Not on file   Number of children: Not on file   Years of education: Not on file   Highest education level: Not on file  Occupational History   Not on file  Tobacco Use   Smoking status: Former    Types: Cigarettes    Quit date: 05/15/2015    Years since quitting: 7.3   Smokeless tobacco: Never  Vaping Use   Vaping Use: Never used  Substance and Sexual Activity   Alcohol use: No    Alcohol/week: 0.0 standard drinks of alcohol   Drug use: No   Sexual activity: Not Currently    Partners: Male    Birth control/protection: Surgical  Other Topics Concern   Not on file  Social History Narrative   Not on file   Social Determinants of Health   Financial Resource Strain: Not on file  Food Insecurity: Not on file  Transportation Needs: Not on file  Physical Activity: Not on file  Stress: Not on file  Social Connections: Not on file  Intimate Partner Violence: Not on file    Vital Signs: Blood pressure 120/70, pulse 91, temperature 97.8 F (36.6 C), resp. rate 16, height '5\' 2"'$  (1.575 m), weight 133 lb (60.3 kg), SpO2 98 %.  Examination: General Appearance: The patient is well-developed, well-nourished, and in no distress. Skin: Gross inspection of skin unremarkable. Head: normocephalic, no gross deformities. Eyes: no gross deformities noted. ENT: ears appear grossly normal no exudates. Neck: Supple. No thyromegaly. No LAD. Respiratory: No rhonchi no rales. Cardiovascular: Normal S1 and S2 without murmur or rub. Extremities: No cyanosis. pulses are equal. Neurologic: Alert and oriented. No involuntary movements.  LABS: No results found for this or any previous visit (from the past 2160 hour(s)).  Radiology: MM 3D SCREEN BREAST BILATERAL  Result Date: 08/12/2022 CLINICAL DATA:  Screening. EXAM: DIGITAL SCREENING BILATERAL MAMMOGRAM WITH TOMOSYNTHESIS  AND CAD TECHNIQUE: Bilateral screening digital craniocaudal and mediolateral oblique mammograms were obtained. Bilateral screening digital breast tomosynthesis was performed. The images were evaluated with computer-aided detection. COMPARISON:  Previous exam(s). ACR Breast Density Category c: The breast tissue is heterogeneously dense, which may obscure small masses. FINDINGS: There are no findings suspicious for malignancy. Interval explantation of implants. IMPRESSION: No mammographic evidence of malignancy. A result letter of this screening mammogram will be mailed directly to the patient. RECOMMENDATION: Screening mammogram in one year. (Code:SM-B-01Y) BI-RADS CATEGORY  1: Negative. Electronically Signed   By: Valentino Saxon M.D.   On: 08/12/2022 10:31    No results found.  No results found.    Assessment and Plan: Patient Active Problem List   Diagnosis Date Noted   Fatigue 03/01/2022   Situational anxiety 06/29/2021   Need for shingles vaccine 06/29/2021   Chronic  fatigue 06/29/2021   Situational stress 06/29/2021   Pain in joint, lower leg 06/28/2021   Fatigue after COVID-19 vaccination 06/13/2021   Edema 08/05/2018   Impingement syndrome of shoulder region 08/05/2018   Knee joint effusion 08/05/2018   Old anterior cruciate ligament disruption 08/05/2018   Patellar tendonitis 08/05/2018   Basal cell carcinoma (BCC) of jawline 06/26/2018   History of pubovaginal sling 04/01/2017   Essential tremor 05/15/2016   Allergic rhinitis 06/22/2015   B12 deficiency 06/21/2015   H/O: hysterectomy 06/21/2015   Hypertriglyceridemia 06/21/2015   Hypoglycemia 06/21/2015   Menopausal and perimenopausal disorder 06/21/2015   Vasovagal symptom 06/21/2015   Insomnia 04/19/2015   Female stress incontinence 03/24/2012   Adult hypothyroidism 08/28/2009   Avitaminosis D 08/28/2009   Chronic infection of sinus 02/01/2009   Adaptation reaction 09/09/2008   Genital herpes simplex type 1  infection 07/13/2008    Insomnia multifactorial.  We had a long conversation regarding sleep hygiene once again.  Discussed with her the importance of avoiding caffeinated products.  In addition to that based on what she is telling me she may be a candidate for zaleplon so I will give her prescription for the zaleplon she was instructed that she can use it in the middle of the night if she does wake up to help her get back to sleep.  Methods to improve your sleep habits -Keep a consistent wind down period before your bedtime to help you relax. Do not do work-related activities around bedtime.  -Go to bed only when you are sleepy.  -Get out of bed at the same time every morning, even on holidays or weekends.  -If you haven't fallen asleep after lying in bed in 20 minutes, do something boring and relaxing with minimal light expo sure outside the bedroom. Return to bed when you are sleepy.  -While in bed, turn the clock away from you. It'll only remind you of something you probably don't want to know.  -No napping during the day, especially after 3 pm. If a nap is unavoidable, keep it under an hour.  -Your bedroom should be quiet and dark.  -Take a warm, relaxing bath or shower! A warm bath or shower prior to bedtime increasesyour body's temperature so the subsequent cooling off period can help induce sleep.  -Exercise regularly, but not tough exercise within 6 hours or bedtime.  -Avoid eating large or fatty meals near bedtime.  -Avoid caffeine after lunch.    1 - Portions taken from http://yoursleep.https://www.mills.com/.aspx   General Counseling: I have discussed the findings of the evaluation and examination with Korrin.  I have also discussed any further diagnostic evaluation thatmay be needed or ordered today. Lubna verbalizes understanding of the findings of todays visit. We also reviewed her medications today and discussed drug interactions and side effects including but not limited  excessive drowsiness and altered mental states. We also discussed that there is always a risk not just to her but also people around her. she has been encouraged to call the office with any questions or concerns that should arise related to todays visit.  No orders of the defined types were placed in this encounter.    Time spent: 46  I have personally obtained a history, examined the patient, evaluated laboratory and imaging results, formulated the assessment and plan and placed orders.    Allyne Gee, MD [facility] Pulmonary and Critical Care Sleep medicine

## 2022-09-23 ENCOUNTER — Other Ambulatory Visit: Payer: Self-pay | Admitting: *Deleted

## 2022-09-23 DIAGNOSIS — N959 Unspecified menopausal and perimenopausal disorder: Secondary | ICD-10-CM

## 2022-09-23 MED ORDER — ESTRADIOL 0.0375 MG/24HR TD PTTW: 1.0000 | MEDICATED_PATCH | TRANSDERMAL | 2 refills | Status: DC

## 2022-10-10 ENCOUNTER — Telehealth: Payer: Managed Care, Other (non HMO) | Admitting: Family

## 2022-10-10 DIAGNOSIS — J019 Acute sinusitis, unspecified: Secondary | ICD-10-CM

## 2022-10-10 MED ORDER — PREDNISONE 10 MG (21) PO TBPK: ORAL_TABLET | ORAL | 0 refills | Status: DC

## 2022-10-10 MED ORDER — DOXYCYCLINE HYCLATE 100 MG PO TABS: 100.0000 mg | ORAL_TABLET | Freq: Two times a day (BID) | ORAL | 0 refills | Status: DC

## 2022-10-10 NOTE — Progress Notes (Signed)
Virtual Visit Consent   Theresa Walton, you are scheduled for a virtual visit with a [facility] provider today. Just as with appointments in the office, your consent must be obtained to participate. Your consent will be active for this visit and any virtual visit you may have with one of our providers in the next 365 days. If you have a MyChart account, a copy of this consent can be sent to you electronically.  As this is a virtual visit, video technology does not allow for your provider to perform a traditional examination. This may limit your provider's ability to fully assess your condition. If your provider identifies any concerns that need to be evaluated in person or the need to arrange testing (such as labs, EKG, etc.), we will make arrangements to do so. Although advances in technology are sophisticated, we cannot ensure that it will always work on either your end or our end. If the connection with a video visit is poor, the visit may have to be switched to a telephone visit. With either a video or telephone visit, we are not always able to ensure that we have a secure connection.  By engaging in this virtual visit, you consent to the provision of healthcare and authorize for your insurance to be billed (if applicable) for the services provided during this visit. Depending on your insurance coverage, you may receive a charge related to this service.  I need to obtain your verbal consent now. Are you willing to proceed with your visit today? Theresa Walton has provided verbal consent on 10/10/2022 for a virtual visit (video or telephone). Theresa Dun, FNP  Date: 10/10/2022 6:50 PM  Virtual Visit via Video Note   I, Theresa Walton, connected with  Theresa Walton  ([id number], 1966-10-26) on 10/10/22 at  7:00 PM EST by a video-enabled telemedicine application and verified that I am speaking with the correct person using two identifiers.  Location: Patient: Virtual Visit Location Patient:  Home Provider: Virtual Visit Location Provider: Home Office   I discussed the limitations of evaluation and management by telemedicine and the availability of in person appointments. The patient expressed understanding and agreed to proceed.    History of Present Illness: Theresa Walton is a 56 y.o. who identifies as a female who was assigned female at birth, and is being seen today for sinus congestion that started over 10 days ago, but has worsen.  HPI: Sinusitis The current episode started 1 to 4 weeks ago. The problem has been gradually worsening since onset. There has been no fever. Her pain is at a severity of 4/10. The pain is mild. Associated symptoms include congestion, coughing, headaches and sinus pressure. Pertinent negatives include no sore throat. Past treatments include acetaminophen. The treatment provided mild relief.    Problems:  Patient Active Problem List   Diagnosis Date Noted   Fatigue 03/01/2022   Situational anxiety 06/29/2021   Need for shingles vaccine 06/29/2021   Chronic fatigue 06/29/2021   Situational stress 06/29/2021   Pain in joint, lower leg 06/28/2021   Fatigue after COVID-19 vaccination 06/13/2021   Edema 08/05/2018   Impingement syndrome of shoulder region 08/05/2018   Knee joint effusion 08/05/2018   Old anterior cruciate ligament disruption 08/05/2018   Patellar tendonitis 08/05/2018   Basal cell carcinoma (BCC) of jawline 06/26/2018   History of pubovaginal sling 04/01/2017   Essential tremor 05/15/2016   Allergic rhinitis 06/22/2015   B12 deficiency 06/21/2015   H/O: hysterectomy 06/21/2015   Hypertriglyceridemia 06/21/2015  Hypoglycemia 06/21/2015   Menopausal and perimenopausal disorder 06/21/2015   Vasovagal symptom 06/21/2015   Insomnia 04/19/2015   Female stress incontinence 03/24/2012   Adult hypothyroidism 08/28/2009   Avitaminosis D 08/28/2009   Chronic infection of sinus 02/01/2009   Adaptation reaction 09/09/2008   Genital  herpes simplex type 1 infection 07/13/2008    Allergies:  Allergies  Allergen Reactions   Cefaclor Anaphylaxis   Cephalosporins Anaphylaxis   Lubiprostone     Other reaction(s): Angioedema Tongue Swelling x 2 times.  Other reaction(s): Angioedema Tongue Swelling x 2 times.    Celecoxib    Milk (Cow)     Other reaction(s): Other (See Comments) GI Upset to milk protein   Apple Juice Nausea And Vomiting   Pseudoephedrine Palpitations    Tachycardia   Medications:  Current Outpatient Medications:    doxycycline (VIBRA-TABS) 100 MG tablet, Take 1 tablet (100 mg total) by mouth 2 (two) times daily., Disp: 20 tablet, Rfl: 0   predniSONE (STERAPRED UNI-PAK 21 TAB) 10 MG (21) TBPK tablet, Use as directed, Disp: 21 tablet, Rfl: 0   azelastine (ASTELIN) 0.1 % nasal spray, Place into the nose., Disp: , Rfl:    cetirizine (ZYRTEC) 5 MG tablet, Take 5 mg by mouth daily., Disp: , Rfl:    cyclobenzaprine (FLEXERIL) 5 MG tablet, TAKE 1 TO 2 TABLETS BY MOUTH AT BEDTIME, Disp: 30 tablet, Rfl: 0   diclofenac (VOLTAREN) 75 MG EC tablet, Take 1 tablet (75 mg total) by mouth 2 (two) times daily., Disp: 60 tablet, Rfl: 0   estradiol (ESTRACE) 0.1 MG/GM vaginal cream, Place 0.5g nightly for two weeks then twice a week after, Disp: 30 g, Rfl: 11   estradiol (VIVELLE-DOT) 0.0375 MG/24HR, Place 1 patch onto the skin 2 (two) times a week., Disp: 24 patch, Rfl: 2   levocetirizine (XYZAL) 5 MG tablet, Alternates with zyrtec, Disp: , Rfl:    levothyroxine (SYNTHROID) 50 MCG tablet, TAKE 1 TABLET(50 MCG) BY MOUTH DAILY, Disp: 90 tablet, Rfl: 3   phenazopyridine (PYRIDIUM) 200 MG tablet, Take 1 tablet (200 mg total) by mouth 3 (three) times daily as needed for pain., Disp: 10 tablet, Rfl: 0   Suvorexant (BELSOMRA) 5 MG TABS, Take 5 mg by mouth at bedtime., Disp: 30 tablet, Rfl: 1   triamcinolone cream (KENALOG) 0.1 %, Apply 1 application topically 2 (two) times daily., Disp: 30 g, Rfl: 0   Trospium Chloride 60 MG  CP24, Take 1 capsule (60 mg total) by mouth daily., Disp: 90 capsule, Rfl: 2   valACYclovir (VALTREX) 1000 MG tablet, Take 1 tablet (1,000 mg total) by mouth daily. Take for 5 days, Disp: 30 tablet, Rfl: 2   zaleplon (SONATA) 10 MG capsule, Take 1 capsule (10 mg total) by mouth at bedtime as needed for sleep., Disp: 30 capsule, Rfl: 4  Observations/Objective: Patient is well-developed, well-nourished in no acute distress.  Resting comfortably  at home.  Head is normocephalic, atraumatic.  No labored breathing.  Speech is clear and coherent with logical content.  Patient is alert and oriented at baseline.  Sinus pressure   Assessment and Plan: 1. Acute sinusitis, recurrence not specified, unspecified location - doxycycline (VIBRA-TABS) 100 MG tablet; Take 1 tablet (100 mg total) by mouth 2 (two) times daily.  Dispense: 20 tablet; Refill: 0 - predniSONE (STERAPRED UNI-PAK 21 TAB) 10 MG (21) TBPK tablet; Use as directed  Dispense: 21 tablet; Refill: 0  - Take meds as prescribed - Use a cool mist humidifier  -  Use saline nose sprays frequently -Force fluids -For any cough or congestion  Use plain Mucinex- regular strength or max strength is fine -For fever or aces or pains- take tylenol or ibuprofen. -Throat lozenges if help -Follow up if symptoms worsen or do not improve   Follow Up Instructions: I discussed the assessment and treatment plan with the patient. The patient was provided an opportunity to ask questions and all were answered. The patient agreed with the plan and demonstrated an understanding of the instructions.  A copy of instructions were sent to the patient via MyChart unless otherwise noted below.     The patient was advised to call back or seek an in-person evaluation if the symptoms worsen or if the condition fails to improve as anticipated.  Time:  I spent 6 minutes with the patient via telehealth technology discussing the above problems/concerns.    Theresa Dun, FNP

## 2022-10-16 ENCOUNTER — Other Ambulatory Visit: Payer: Self-pay | Admitting: Family Medicine

## 2022-10-16 DIAGNOSIS — M62838 Other muscle spasm: Secondary | ICD-10-CM

## 2022-10-26 ENCOUNTER — Encounter: Payer: Self-pay | Admitting: Nurse Practitioner

## 2022-11-15 ENCOUNTER — Ambulatory Visit: Payer: Managed Care, Other (non HMO) | Admitting: Obstetrics and Gynecology

## 2022-11-26 ENCOUNTER — Telehealth: Payer: Managed Care, Other (non HMO) | Admitting: Physician Assistant

## 2022-11-26 DIAGNOSIS — B9689 Other specified bacterial agents as the cause of diseases classified elsewhere: Secondary | ICD-10-CM | POA: Diagnosis not present

## 2022-11-26 DIAGNOSIS — J019 Acute sinusitis, unspecified: Secondary | ICD-10-CM

## 2022-11-26 MED ORDER — DOXYCYCLINE HYCLATE 100 MG PO TABS: 100.0000 mg | ORAL_TABLET | Freq: Two times a day (BID) | ORAL | 0 refills | Status: DC

## 2022-11-26 MED ORDER — PREDNISONE 20 MG PO TABS: 40.0000 mg | ORAL_TABLET | Freq: Every day | ORAL | 0 refills | Status: DC

## 2022-11-26 NOTE — Progress Notes (Signed)
Virtual Visit Consent   Theresa Walton, you are scheduled for a virtual visit with a [name] provider today. Just as with appointments in the office, your consent must be obtained to participate. Your consent will be active for this visit and any virtual visit you may have with one of our providers in the next 365 days. If you have a MyChart account, a copy of this consent can be sent to you electronically.  As this is a virtual visit, video technology does not allow for your provider to perform a traditional examination. This may limit your provider's ability to fully assess your condition. If your provider identifies any concerns that need to be evaluated in person or the need to arrange testing (such as labs, EKG, etc.), we will make arrangements to do so. Although advances in technology are sophisticated, we cannot ensure that it will always work on either your end or our end. If the connection with a video visit is poor, the visit may have to be switched to a telephone visit. With either a video or telephone visit, we are not always able to ensure that we have a secure connection.  By engaging in this virtual visit, you consent to the provision of healthcare and authorize for your insurance to be billed (if applicable) for the services provided during this visit. Depending on your insurance coverage, you may receive a charge related to this service.  I need to obtain your verbal consent now. Are you willing to proceed with your visit today? [name] has provided verbal consent on 11/26/2022 for a virtual visit (video or telephone). Theresa Walton, [place]  Date: 11/26/2022 5:06 PM  Virtual Visit via Video Note   I, Theresa Walton, connected with  Theresa Walton  ([id number], 02-Jul-1966) on 11/26/22 at  5:15 PM EST by a video-enabled telemedicine application and verified that I am speaking with the correct person using two identifiers.  Location: Patient: Virtual Visit Location  Patient: Home Provider: Virtual Visit Location Provider: Home Office   I discussed the limitations of evaluation and management by telemedicine and the availability of in person appointments. The patient expressed understanding and agreed to proceed.    History of Present Illness: Theresa Walton is a 57 y.o. who identifies as a female who was assigned female at birth, and is being seen today for URI symptoms starting over the past week or so. Noting sinus congestion, head congestion, sore throat, sinus pain. Denies fever, chills. Denies chest pain or SOB. Is keeping hydrated. Is prone to sinus infections and usually requires antibiotic and steroids to overcome the infection.   HPI: HPI  Problems:  Patient Active Problem List   Diagnosis Date Noted   Fatigue 03/01/2022   Situational anxiety 06/29/2021   Need for shingles vaccine 06/29/2021   Chronic fatigue 06/29/2021   Situational stress 06/29/2021   Pain in joint, lower leg 06/28/2021   Fatigue after COVID-19 vaccination 06/13/2021   Edema 08/05/2018   Impingement syndrome of shoulder region 08/05/2018   Knee joint effusion 08/05/2018   Old anterior cruciate ligament disruption 08/05/2018   Patellar tendonitis 08/05/2018   Basal cell carcinoma (BCC) of jawline 06/26/2018   History of pubovaginal sling 04/01/2017   Essential tremor 05/15/2016   Allergic rhinitis 06/22/2015   B12 deficiency 06/21/2015   H/O: hysterectomy 06/21/2015   Hypertriglyceridemia 06/21/2015   Hypoglycemia 06/21/2015   Menopausal and perimenopausal disorder 06/21/2015   Vasovagal symptom 06/21/2015   Insomnia 04/19/2015   Female stress  incontinence 03/24/2012   Adult hypothyroidism 08/28/2009   Avitaminosis D 08/28/2009   Chronic infection of sinus 02/01/2009   Adaptation reaction 09/09/2008   Genital herpes simplex type 1 infection 07/13/2008    Allergies:  Allergies  Allergen Reactions   Cefaclor Anaphylaxis   Cephalosporins Anaphylaxis    Lubiprostone     Other reaction(s): Angioedema Tongue Swelling x 2 times.  Other reaction(s): Angioedema Tongue Swelling x 2 times.    Celecoxib    Milk (Cow)     Other reaction(s): Other (See Comments) GI Upset to milk protein   Apple Juice Nausea And Vomiting   Pseudoephedrine Palpitations    Tachycardia   Medications:  Current Outpatient Medications:    doxycycline (VIBRA-TABS) 100 MG tablet, Take 1 tablet (100 mg total) by mouth 2 (two) times daily., Disp: 20 tablet, Rfl: 0   predniSONE (DELTASONE) 20 MG tablet, Take 2 tablets (40 mg total) by mouth daily with breakfast., Disp: 10 tablet, Rfl: 0   azelastine (ASTELIN) 0.1 % nasal spray, Place into the nose., Disp: , Rfl:    cyclobenzaprine (FLEXERIL) 5 MG tablet, TAKE 1 TO 2 TABLETS BY MOUTH AT BEDTIME, Disp: 30 tablet, Rfl: 0   estradiol (VIVELLE-DOT) 0.0375 MG/24HR, Place 1 patch onto the skin 2 (two) times a week., Disp: 24 patch, Rfl: 2   levothyroxine (SYNTHROID) 50 MCG tablet, TAKE 1 TABLET(50 MCG) BY MOUTH DAILY, Disp: 90 tablet, Rfl: 3   triamcinolone cream (KENALOG) 0.1 %, Apply 1 application topically 2 (two) times daily., Disp: 30 g, Rfl: 0   Trospium Chloride 60 MG CP24, Take 1 capsule (60 mg total) by mouth daily., Disp: 90 capsule, Rfl: 2   zaleplon (SONATA) 10 MG capsule, Take 1 capsule (10 mg total) by mouth at bedtime as needed for sleep., Disp: 30 capsule, Rfl: 4  Observations/Objective: Patient is well-developed, well-nourished in no acute distress.  Resting comfortably at home.  Head is normocephalic, atraumatic.  No labored breathing. Speech is clear and coherent with logical content.  Patient is alert and oriented at baseline.   Assessment and Plan: 1. Acute bacterial sinusitis - doxycycline (VIBRA-TABS) 100 MG tablet; Take 1 tablet (100 mg total) by mouth 2 (two) times daily.  Dispense: 20 tablet; Refill: 0 - predniSONE (DELTASONE) 20 MG tablet; Take 2 tablets (40 mg total) by mouth daily with breakfast.   Dispense: 10 tablet; Refill: 0  Rx Doxycycline per orders.  Increase fluids.  Rest.  Saline nasal spray.  Probiotic.  Mucinex as directed.  Humidifier in bedroom. Nasonex daily. Did put script on file for a short burst of prednisone to start as directed if symptoms not improving with other measures over next 2-3 days.  Call or return to clinic if symptoms are not improving.   Follow Up Instructions: I discussed the assessment and treatment plan with the patient. The patient was provided an opportunity to ask questions and all were answered. The patient agreed with the plan and demonstrated an understanding of the instructions.  A copy of instructions were sent to the patient via MyChart unless otherwise noted below.   The patient was advised to call back or seek an in-person evaluation if the symptoms worsen or if the condition fails to improve as anticipated.  Time:  I spent 10 minutes with the patient via telehealth technology discussing the above problems/concerns.    Theresa Rio, PA-C

## 2022-11-26 NOTE — Patient Instructions (Signed)
Theresa Walton, thank you for joining [name], PA-C for today's virtual visit.  While this provider is not your primary care provider (PCP), if your PCP is located in our provider database this encounter information will be shared with them immediately following your visit.   [facility] account gives you access to today's visit and all your visits, tests, and labs performed at [facility] " click here if you don't have a [facility] account or go to [email or web address]  Consent: (Patient) Theresa Walton provided verbal consent for this virtual visit at the beginning of the encounter.  Current Medications:  Current Outpatient Medications:    azelastine (ASTELIN) 0.1 % nasal spray, Place into the nose., Disp: , Rfl:    cetirizine (ZYRTEC) 5 MG tablet, Take 5 mg by mouth daily., Disp: , Rfl:    cyclobenzaprine (FLEXERIL) 5 MG tablet, TAKE 1 TO 2 TABLETS BY MOUTH AT BEDTIME, Disp: 30 tablet, Rfl: 0   diclofenac (VOLTAREN) 75 MG EC tablet, Take 1 tablet (75 mg total) by mouth 2 (two) times daily., Disp: 60 tablet, Rfl: 0   doxycycline (VIBRA-TABS) 100 MG tablet, Take 1 tablet (100 mg total) by mouth 2 (two) times daily., Disp: 20 tablet, Rfl: 0   estradiol (ESTRACE) 0.1 MG/GM vaginal cream, Place 0.5g nightly for two weeks then twice a week after, Disp: 30 g, Rfl: 11   estradiol (VIVELLE-DOT) 0.0375 MG/24HR, Place 1 patch onto the skin 2 (two) times a week., Disp: 24 patch, Rfl: 2   levocetirizine (XYZAL) 5 MG tablet, Alternates with zyrtec, Disp: , Rfl:    levothyroxine (SYNTHROID) 50 MCG tablet, TAKE 1 TABLET(50 MCG) BY MOUTH DAILY, Disp: 90 tablet, Rfl: 3   phenazopyridine (PYRIDIUM) 200 MG tablet, Take 1 tablet (200 mg total) by mouth 3 (three) times daily as needed for pain., Disp: 10 tablet, Rfl: 0   predniSONE (STERAPRED UNI-PAK 21 TAB) 10 MG (21) TBPK tablet, Use as directed, Disp: 21 tablet, Rfl: 0   Suvorexant (BELSOMRA) 5 MG TABS, Take 5  mg by mouth at bedtime., Disp: 30 tablet, Rfl: 1   triamcinolone cream (KENALOG) 0.1 %, Apply 1 application topically 2 (two) times daily., Disp: 30 g, Rfl: 0   Trospium Chloride 60 MG CP24, Take 1 capsule (60 mg total) by mouth daily., Disp: 90 capsule, Rfl: 2   valACYclovir (VALTREX) 1000 MG tablet, Take 1 tablet (1,000 mg total) by mouth daily. Take for 5 days, Disp: 30 tablet, Rfl: 2   zaleplon (SONATA) 10 MG capsule, Take 1 capsule (10 mg total) by mouth at bedtime as needed for sleep., Disp: 30 capsule, Rfl: 4   Medications ordered in this encounter:  No orders of the defined types were placed in this encounter.    *If you need refills on other medications prior to your next appointment, please contact your pharmacy*  Follow-Up: Call back or seek an in-person evaluation if the symptoms worsen or if the condition fails to improve as anticipated.  [place] [id number]  Other Instructions Please take antibiotic as directed.  Increase fluid intake.  Use Saline nasal spray.  Take a daily multivitamin. Use the Nasonex as directed.  Place a humidifier in the bedroom.  Hold off on use of the prednisone for the next 2-3 days. If symptoms improving with other measures, no need to start. If not improving or anything new/worsening, ok to take the prednisone as directed.  Please call or return clinic if symptoms are  not improving.  Sinusitis Sinusitis is redness, soreness, and swelling (inflammation) of the paranasal sinuses. Paranasal sinuses are air pockets within the bones of your face (beneath the eyes, the middle of the forehead, or above the eyes). In healthy paranasal sinuses, mucus is able to drain out, and air is able to circulate through them by way of your nose. However, when your paranasal sinuses are inflamed, mucus and air can become trapped. This can allow bacteria and other germs to grow and cause infection. Sinusitis can develop quickly and last only a short time  (acute) or continue over a long period (chronic). Sinusitis that lasts for more than 12 weeks is considered chronic.  CAUSES  Causes of sinusitis include: Allergies. Structural abnormalities, such as displacement of the cartilage that separates your nostrils (deviated septum), which can decrease the air flow through your nose and sinuses and affect sinus drainage. Functional abnormalities, such as when the small hairs (cilia) that line your sinuses and help remove mucus do not work properly or are not present. SYMPTOMS  Symptoms of acute and chronic sinusitis are the same. The primary symptoms are pain and pressure around the affected sinuses. Other symptoms include: Upper toothache. Earache. Headache. Bad breath. Decreased sense of smell and taste. A cough, which worsens when you are lying flat. Fatigue. Fever. Thick drainage from your nose, which often is green and may contain pus (purulent). Swelling and warmth over the affected sinuses. DIAGNOSIS  Your caregiver will perform a physical exam. During the exam, your caregiver may: Look in your nose for signs of abnormal growths in your nostrils (nasal polyps). Tap over the affected sinus to check for signs of infection. View the inside of your sinuses (endoscopy) with a special imaging device with a light attached (endoscope), which is inserted into your sinuses. If your caregiver suspects that you have chronic sinusitis, one or more of the following tests may be recommended: Allergy tests. Nasal culture A sample of mucus is taken from your nose and sent to a lab and screened for bacteria. Nasal cytology A sample of mucus is taken from your nose and examined by your caregiver to determine if your sinusitis is related to an allergy. TREATMENT  Most cases of acute sinusitis are related to a viral infection and will resolve on their own within 10 days. Sometimes medicines are prescribed to help relieve symptoms (pain medicine,  decongestants, nasal steroid sprays, or saline sprays).  However, for sinusitis related to a bacterial infection, your caregiver will prescribe antibiotic medicines. These are medicines that will help kill the bacteria causing the infection.  Rarely, sinusitis is caused by a fungal infection. In theses cases, your caregiver will prescribe antifungal medicine. For some cases of chronic sinusitis, surgery is needed. Generally, these are cases in which sinusitis recurs more than 3 times per year, despite other treatments. HOME CARE INSTRUCTIONS  Drink plenty of water. Water helps thin the mucus so your sinuses can drain more easily. Use a humidifier. Inhale steam 3 to 4 times a day (for example, sit in the bathroom with the shower running). Apply a warm, moist washcloth to your face 3 to 4 times a day, or as directed by your caregiver. Use saline nasal sprays to help moisten and clean your sinuses. Take over-the-counter or prescription medicines for pain, discomfort, or fever only as directed by your caregiver. SEEK IMMEDIATE MEDICAL CARE IF: You have increasing pain or severe headaches. You have nausea, vomiting, or drowsiness. You have swelling around your face.  You have vision problems. You have a stiff neck. You have difficulty breathing. MAKE SURE YOU:  Understand these instructions. Will watch your condition. Will get help right away if you are not doing well or get worse. Document Released: 10/14/2005 Document Revised: 01/06/2012 Document Reviewed: 10/29/2011 [facility] Patient Information 2014 [place], [place].    If you have been instructed to have an in-person evaluation today at a local Urgent Care facility, please use the link below. It will take you to a list of all of our available [facility], including address, phone number and hours of operation. Please do not delay care.  [facility]  If you or a family member do not have a primary care provider,  use the link below to schedule a visit and establish care. When you choose a [facility] primary care physician or advanced practice provider, you gain a long-term partner in health. Find a Primary Care Provider  Learn more about [facility]'s in-office and virtual care options: [facility]

## 2022-12-03 ENCOUNTER — Other Ambulatory Visit: Payer: Self-pay | Admitting: Internal Medicine

## 2022-12-03 ENCOUNTER — Encounter: Payer: Self-pay | Admitting: Internal Medicine

## 2022-12-03 ENCOUNTER — Ambulatory Visit: Payer: Managed Care, Other (non HMO) | Admitting: Internal Medicine

## 2022-12-03 VITALS — BP 110/62 | HR 91 | Ht 62.0 in | Wt 133.4 lb

## 2022-12-03 DIAGNOSIS — F4312 Post-traumatic stress disorder, chronic: Secondary | ICD-10-CM | POA: Diagnosis not present

## 2022-12-03 DIAGNOSIS — E039 Hypothyroidism, unspecified: Secondary | ICD-10-CM | POA: Diagnosis not present

## 2022-12-03 DIAGNOSIS — E559 Vitamin D deficiency, unspecified: Secondary | ICD-10-CM

## 2022-12-03 DIAGNOSIS — J3081 Allergic rhinitis due to animal (cat) (dog) hair and dander: Secondary | ICD-10-CM | POA: Diagnosis not present

## 2022-12-03 DIAGNOSIS — Z9109 Other allergy status, other than to drugs and biological substances: Secondary | ICD-10-CM

## 2022-12-03 DIAGNOSIS — F4310 Post-traumatic stress disorder, unspecified: Secondary | ICD-10-CM

## 2022-12-03 DIAGNOSIS — E782 Mixed hyperlipidemia: Secondary | ICD-10-CM | POA: Diagnosis not present

## 2022-12-03 MED ORDER — MONTELUKAST SODIUM 10 MG PO TABS: 10.0000 mg | ORAL_TABLET | Freq: Every day | ORAL | 2 refills | Status: DC

## 2022-12-03 MED ORDER — PRAZOSIN HCL 1 MG PO CAPS: 1.0000 mg | ORAL_CAPSULE | Freq: Every day | ORAL | Status: DC

## 2022-12-03 MED ORDER — PRAZOSIN HCL 1 MG PO CAPS: 1.0000 mg | ORAL_CAPSULE | Freq: Every day | ORAL | 2 refills | Status: DC

## 2022-12-03 NOTE — Progress Notes (Signed)
Established Patient Office Visit  Subjective   Patient ID: Theresa Walton, female    DOB: [birth year]  Age: 57 y.o. MRN: [id number]  Chief Complaint  Patient presents with  . Follow-up    4 month follow up    Patient comes in for follow up. Today still complains of insomnia- seen by sleep specialist. Patient admits to anxiety related to childhood PTSD. Agrees to try Prazosin. Cymbalta is recommended by her Ortho for chronic aches and pains. Trying to follow good sleep hygiene. Also c/o recurrent sinus infections- and nasal and sinus allergies. Agrees to resume Flonase, Zyrtec, will add Singulair.    Review of Systems  Constitutional: Negative.   HENT:  Positive for congestion and sinus pain. Negative for ear discharge.   Eyes: Negative.   Respiratory:  Positive for cough, shortness of breath and wheezing.   Cardiovascular: Negative.   Gastrointestinal: Negative.   Genitourinary: Negative.   Musculoskeletal: Negative.   Skin: Negative.   Neurological: Negative.   Endo/Heme/Allergies: Negative.   Psychiatric/Behavioral:  Positive for suicidal ideas. Negative for substance abuse. The patient is nervous/anxious and has insomnia.      Objective:     BP 110/62 (BP Location: Left Arm)   Pulse 91   Ht '5\' 2"'$  (1.575 m)   Wt 133 lb 6.4 oz (60.5 kg)   SpO2 98%   BMI 24.40 kg/m    Physical Exam Vitals and nursing note reviewed.  HENT:     Head: Normocephalic and atraumatic.     Nose: Nose normal.     Mouth/Throat:     Mouth: Mucous membranes are moist.  Eyes:     Pupils: Pupils are equal, round, and reactive to light.  Cardiovascular:     Rate and Rhythm: Normal rate and regular rhythm.  Pulmonary:     Effort: Pulmonary effort is normal.     Breath sounds: Normal breath sounds.  Abdominal:     General: Abdomen is flat.     Palpations: Abdomen is soft.  Musculoskeletal:        General: Normal range of motion.     Cervical back: Normal range of motion.  Skin:     General: Skin is warm.  Neurological:     General: No focal deficit present.     Mental Status: She is alert and oriented to person, place, and time.  Psychiatric:        Mood and Affect: Mood normal.    No results found for any visits on 12/03/22.    The ASCVD Risk score (Arnett DK, et al., 2019) failed to calculate for the following reasons:   The valid total cholesterol range is 130 to 320 mg/dL    Assessment & Plan:   Problem List Items Addressed This Visit     Adult hypothyroidism   Relevant Orders   TSH   Vitamin D insufficiency   Relevant Orders   Vitamin D 1,25 dihydroxy   Allergic rhinitis - Primary   Relevant Medications   montelukast (SINGULAIR) 10 MG tablet   Chronic post-traumatic stress disorder (PTSD)   Relevant Medications   prazosin (MINIPRESS) 1 MG capsule   Mixed hyperlipidemia   Relevant Medications   rosuvastatin (CRESTOR) 10 MG tablet   prazosin (MINIPRESS) 1 MG capsule   Other Relevant Orders   CMP14+EGFR   Lipid Panel w/o Chol/HDL Ratio   Post traumatic stress disorder (PTSD)    Return in about 3 weeks (around 12/24/2022).    Perrin Maltese, MD

## 2022-12-04 ENCOUNTER — Other Ambulatory Visit: Payer: Managed Care, Other (non HMO)

## 2022-12-05 MED ORDER — AZELASTINE HCL 0.1 % NA SOLN: 1.0000 | Freq: Two times a day (BID) | NASAL | 0 refills | Status: DC

## 2022-12-11 ENCOUNTER — Encounter: Payer: Self-pay | Admitting: Internal Medicine

## 2022-12-12 ENCOUNTER — Encounter: Payer: Self-pay | Admitting: Family Medicine

## 2022-12-12 DIAGNOSIS — N959 Unspecified menopausal and perimenopausal disorder: Secondary | ICD-10-CM

## 2022-12-12 MED ORDER — ESTRADIOL 0.025 MG/24HR TD PTTW: 1.0000 | MEDICATED_PATCH | TRANSDERMAL | 12 refills | Status: DC

## 2022-12-12 NOTE — Telephone Encounter (Signed)
Looks like the Vitamin D is still pending that's why they are not showing

## 2022-12-16 ENCOUNTER — Encounter: Payer: Self-pay | Admitting: *Deleted

## 2022-12-18 LAB — CMP14+EGFR
ALT: 14 IU/L (ref 0–32)
AST: 12 IU/L (ref 0–40)
Albumin/Globulin Ratio: 2.4 — ABNORMAL HIGH (ref 1.2–2.2)
Albumin: 4.4 g/dL (ref 3.8–4.9)
Alkaline Phosphatase: 53 IU/L (ref 44–121)
BUN/Creatinine Ratio: 13 (ref 9–23)
BUN: 9 mg/dL (ref 6–24)
Bilirubin Total: 0.4 mg/dL (ref 0.0–1.2)
CO2: 24 mmol/L (ref 20–29)
Calcium: 9.5 mg/dL (ref 8.7–10.2)
Chloride: 104 mmol/L (ref 96–106)
Creatinine, Ser: 0.69 mg/dL (ref 0.57–1.00)
Globulin, Total: 1.8 g/dL (ref 1.5–4.5)
Glucose: 86 mg/dL (ref 70–99)
Potassium: 4.1 mmol/L (ref 3.5–5.2)
Sodium: 141 mmol/L (ref 134–144)
Total Protein: 6.2 g/dL (ref 6.0–8.5)
eGFR: 101 mL/min/{1.73_m2} (ref >59)

## 2022-12-18 LAB — LIPID PANEL W/O CHOL/HDL RATIO
Cholesterol, Total: 144 mg/dL (ref 100–199)
HDL: 67 mg/dL (ref >39)
LDL Chol Calc (NIH): 61 mg/dL (ref 0–99)
Triglycerides: 84 mg/dL (ref 0–149)
VLDL Cholesterol Cal: 16 mg/dL (ref 5–40)

## 2022-12-18 LAB — VITAMIN D 1,25 DIHYDROXY
Vitamin D 1, 25 (OH)2 Total: 29 pg/mL
Vitamin D2 1, 25 (OH)2: <10 pg/mL
Vitamin D3 1, 25 (OH)2: 28 pg/mL

## 2022-12-18 LAB — TSH: TSH: 3.88 u[IU]/mL (ref 0.450–4.500)

## 2022-12-20 ENCOUNTER — Telehealth: Payer: Managed Care, Other (non HMO) | Admitting: Physician Assistant

## 2022-12-20 ENCOUNTER — Other Ambulatory Visit: Payer: Self-pay

## 2022-12-20 ENCOUNTER — Telehealth: Payer: Self-pay

## 2022-12-20 DIAGNOSIS — J321 Chronic frontal sinusitis: Secondary | ICD-10-CM

## 2022-12-20 MED ORDER — AZITHROMYCIN 250 MG PO TABS: ORAL_TABLET | ORAL | 0 refills | Status: AC

## 2022-12-20 MED ORDER — PREDNISONE 10 MG (21) PO TBPK: ORAL_TABLET | ORAL | 0 refills | Status: DC

## 2022-12-20 NOTE — Telephone Encounter (Signed)
Pt called and left vm regarding that she has a sinus infection, she is going out of town tomorrow and asked if you can send rx abx & prednisone for her? Please advise

## 2022-12-20 NOTE — Progress Notes (Signed)
Virtual Visit Consent   Shawnese Hurston, you are scheduled for a virtual visit with a [facility] provider today. Just as with appointments in the office, your consent must be obtained to participate. Your consent will be active for this visit and any virtual visit you may have with one of our providers in the next 365 days. If you have a MyChart account, a copy of this consent can be sent to you electronically.  As this is a virtual visit, video technology does not allow for your provider to perform a traditional examination. This may limit your provider's ability to fully assess your condition. If your provider identifies any concerns that need to be evaluated in person or the need to arrange testing (such as labs, EKG, etc.), we will make arrangements to do so. Although advances in technology are sophisticated, we cannot ensure that it will always work on either your end or our end. If the connection with a video visit is poor, the visit may have to be switched to a telephone visit. With either a video or telephone visit, we are not always able to ensure that we have a secure connection.  By engaging in this virtual visit, you consent to the provision of healthcare and authorize for your insurance to be billed (if applicable) for the services provided during this visit. Depending on your insurance coverage, you may receive a charge related to this service.  I need to obtain your verbal consent now. Are you willing to proceed with your visit today? Hutton Vano has provided verbal consent on 12/20/2022 for a virtual visit (video or telephone). [name], [place]  Date: 12/20/2022 11:35 AM  Virtual Visit via Video Note   I, [name], connected with  Ceceilia Fazzi  ([id number], February 11, 1966) on 12/20/22 at 11:15 AM EST by a video-enabled telemedicine application and verified that I am speaking with the correct person using two identifiers.  Location: Patient: Virtual Visit Location  Patient: Home Provider: Virtual Visit Location Provider: Home Office   I discussed the limitations of evaluation and management by telemedicine and the availability of in person appointments. The patient expressed understanding and agreed to proceed.    History of Present Illness: Orvetta Barrientez is a 57 y.o. who identifies as a female who was assigned female at birth, and is being seen today for concern of a sinusitis. Was evaluated 2 weeks ago by her PCP for ongoing sinus congestion after treatment for sinusitis via video a month or so ago. Has been cleaning out a neighbor's house who is a hoarder and having to be there for many hours a day. Very dusty environment. Denies fever, chills. Mucous is very thick and has gone from clear to white and cloudy. Denies chest congestion. Some cough 2/2 drainage. Is alternating Astelin and Nasonex, taking Claritin alternating with Clarinex, taking her daily Singulair.  Denies fever, chills. Also using decongestants and started using a Navage irrigation system. Has an ENT but retired and no new provider yet. Has to go out of town and worried about things worsening while gone.   HPI: HPI  Problems:  Patient Active Problem List   Diagnosis Date Noted   Chronic post-traumatic stress disorder (PTSD) 12/03/2022   Mixed hyperlipidemia 12/03/2022   Post traumatic stress disorder (PTSD) 12/03/2022   Fatigue 03/01/2022   Situational anxiety 06/29/2021   Need for shingles vaccine 06/29/2021   Chronic fatigue 06/29/2021   Situational stress 06/29/2021   Pain in joint, lower leg 06/28/2021  Fatigue after COVID-19 vaccination 06/13/2021   Edema 08/05/2018   Impingement syndrome of shoulder region 08/05/2018   Knee joint effusion 08/05/2018   Old anterior cruciate ligament disruption 08/05/2018   Patellar tendonitis 08/05/2018   Basal cell carcinoma (BCC) of jawline 06/26/2018   History of pubovaginal sling 04/01/2017   Essential tremor 05/15/2016   Allergic  rhinitis 06/22/2015   B12 deficiency 06/21/2015   H/O: hysterectomy 06/21/2015   Hypertriglyceridemia 06/21/2015   Hypoglycemia 06/21/2015   Menopausal and perimenopausal disorder 06/21/2015   Vasovagal symptom 06/21/2015   Insomnia 04/19/2015   Female stress incontinence 03/24/2012   Adult hypothyroidism 08/28/2009   Vitamin D insufficiency 08/28/2009   Chronic infection of sinus 02/01/2009   Adaptation reaction 09/09/2008   Genital herpes simplex type 1 infection 07/13/2008    Allergies:  Allergies  Allergen Reactions   Cefaclor Anaphylaxis   Cephalosporins Anaphylaxis   Lubiprostone     Other reaction(s): Angioedema Tongue Swelling x 2 times.  Other reaction(s): Angioedema Tongue Swelling x 2 times.    Celecoxib    Milk (Cow)     Other reaction(s): Other (See Comments) GI Upset to milk protein   Apple Juice Nausea And Vomiting   Pseudoephedrine Palpitations    Tachycardia   Medications:  Current Outpatient Medications:    azithromycin (ZITHROMAX) 250 MG tablet, Take 2 tablets on day 1, then 1 tablet daily on days 2 through 5, Disp: 6 tablet, Rfl: 0   predniSONE (STERAPRED UNI-PAK 21 TAB) 10 MG (21) TBPK tablet, Take following package directions, Disp: 21 tablet, Rfl: 0   azelastine (ASTELIN) 0.1 % nasal spray, Place 1 spray into both nostrils 2 (two) times daily., Disp: 9 mL, Rfl: 0   cyclobenzaprine (FLEXERIL) 5 MG tablet, TAKE 1 TO 2 TABLETS BY MOUTH AT BEDTIME, Disp: 30 tablet, Rfl: 0   estradiol (VIVELLE-DOT) 0.025 MG/24HR, Place 1 patch onto the skin 2 (two) times a week., Disp: 8 patch, Rfl: 12   levothyroxine (SYNTHROID) 50 MCG tablet, TAKE 1 TABLET(50 MCG) BY MOUTH DAILY, Disp: 90 tablet, Rfl: 3   montelukast (SINGULAIR) 10 MG tablet, Take 1 tablet (10 mg total) by mouth daily., Disp: 30 tablet, Rfl: 2   prazosin (MINIPRESS) 1 MG capsule, Take 1 capsule (1 mg total) by mouth at bedtime., Disp: 30 capsule, Rfl: 2   rosuvastatin (CRESTOR) 10 MG tablet, Take 10 mg  by mouth daily., Disp: , Rfl:    triamcinolone cream (KENALOG) 0.1 %, Apply 1 application topically 2 (two) times daily., Disp: 30 g, Rfl: 0   Trospium Chloride 60 MG CP24, Take 1 capsule (60 mg total) by mouth daily., Disp: 90 capsule, Rfl: 2   zaleplon (SONATA) 10 MG capsule, Take 1 capsule (10 mg total) by mouth at bedtime as needed for sleep., Disp: 30 capsule, Rfl: 4  Observations/Objective: Patient is well-developed, well-nourished in no acute distress.  Resting comfortably  at home.  Head is normocephalic, atraumatic.  No labored breathing. Speech is clear and coherent with logical content.  Patient is alert and oriented at baseline.   Assessment and Plan: 1. Chronic frontal sinusitis - predniSONE (STERAPRED UNI-PAK 21 TAB) 10 MG (21) TBPK tablet; Take following package directions  Dispense: 21 tablet; Refill: 0 - azithromycin (ZITHROMAX) 250 MG tablet; Take 2 tablets on day 1, then 1 tablet daily on days 2 through 5  Dispense: 6 tablet; Refill: 0  Acute on chronic. Underlying allergic and environmental irritants triggering her symptoms. No with concern for secondary acute sinusitis.  Supportive measures and OTC medications reviewed. Continue Singulair. Stop Claritin/Clarinex. Start Allegra once daily. Continue nasal steroid. Will give steroid taper. Giving medication allergies and recent scripts, will give course of Azithromycin. She needs in-person follow-up with PCP on return for further evaluation and referral to new ENT provider.   Follow Up Instructions: I discussed the assessment and treatment plan with the patient. The patient was provided an opportunity to ask questions and all were answered. The patient agreed with the plan and demonstrated an understanding of the instructions.  A copy of instructions were sent to the patient via MyChart unless otherwise noted below.   The patient was advised to call back or seek an in-person evaluation if the symptoms worsen or if the  condition fails to improve as anticipated.  Time:  I spent 10 minutes with the patient via telehealth technology discussing the above problems/concerns.    [name], PA-C

## 2022-12-20 NOTE — Patient Instructions (Signed)
Theresa Walton, thank you for joining [name], PA-C for today's virtual visit.  While this provider is not your primary care provider (PCP), if your PCP is located in our provider database this encounter information will be shared with them immediately following your visit.   [facility] account gives you access to today's visit and all your visits, tests, and labs performed at [facility] " click here if you don't have a [facility] account or go to [email or web address]  Consent: (Patient) Theresa Walton provided verbal consent for this virtual visit at the beginning of the encounter.  Current Medications:  Current Outpatient Medications:    azelastine (ASTELIN) 0.1 % nasal spray, Place 1 spray into both nostrils 2 (two) times daily., Disp: 9 mL, Rfl: 0   cyclobenzaprine (FLEXERIL) 5 MG tablet, TAKE 1 TO 2 TABLETS BY MOUTH AT BEDTIME, Disp: 30 tablet, Rfl: 0   estradiol (VIVELLE-DOT) 0.025 MG/24HR, Place 1 patch onto the skin 2 (two) times a week., Disp: 8 patch, Rfl: 12   gabapentin (NEURONTIN) 100 MG capsule, Take 100 mg by mouth at bedtime., Disp: , Rfl:    levothyroxine (SYNTHROID) 50 MCG tablet, TAKE 1 TABLET(50 MCG) BY MOUTH DAILY, Disp: 90 tablet, Rfl: 3   montelukast (SINGULAIR) 10 MG tablet, Take 1 tablet (10 mg total) by mouth daily., Disp: 30 tablet, Rfl: 2   prazosin (MINIPRESS) 1 MG capsule, Take 1 capsule (1 mg total) by mouth at bedtime., Disp: 30 capsule, Rfl: 2   rosuvastatin (CRESTOR) 10 MG tablet, Take 10 mg by mouth daily., Disp: , Rfl:    triamcinolone cream (KENALOG) 0.1 %, Apply 1 application topically 2 (two) times daily., Disp: 30 g, Rfl: 0   Trospium Chloride 60 MG CP24, Take 1 capsule (60 mg total) by mouth daily., Disp: 90 capsule, Rfl: 2   zaleplon (SONATA) 10 MG capsule, Take 1 capsule (10 mg total) by mouth at bedtime as needed for sleep., Disp: 30 capsule, Rfl: 4   Medications ordered in this encounter:  No  orders of the defined types were placed in this encounter.    *If you need refills on other medications prior to your next appointment, please contact your pharmacy*  Follow-Up: Call back or seek an in-person evaluation if the symptoms worsen or if the condition fails to improve as anticipated.  [facility] [phone]  Other Instructions Sinus Infection, Adult A sinus infection is soreness and swelling (inflammation) of your sinuses. Sinuses are hollow spaces in the bones around your face. They are located: Around your eyes. In the middle of your forehead. Behind your nose. In your cheekbones. Your sinuses and nasal passages are lined with a fluid called mucus. Mucus drains out of your sinuses. Swelling can trap mucus in your sinuses. This lets germs (bacteria, virus, or fungus) grow, which leads to infection. Most of the time, this condition is caused by a virus. What are the causes? Allergies. Asthma. Germs. Things that block your nose or sinuses. Growths in the nose (nasal polyps). Chemicals or irritants in the air. A fungus. This is rare. What increases the risk? Having a weak body defense system (immune system). Doing a lot of swimming or diving. Using nasal sprays too much. Smoking. What are the signs or symptoms? The main symptoms of this condition are pain and a feeling of pressure around the sinuses. Other symptoms include: Stuffy nose (congestion). This may make it hard to breathe through your nose. Runny nose (drainage).  Soreness, swelling, and warmth in the sinuses. A cough that may get worse at night. Being unable to smell and taste. Mucus that collects in the throat or the back of the nose (postnasal drip). This may cause a sore throat or bad breath. Being very tired (fatigued). A fever. How is this diagnosed? Your symptoms. Your medical history. A physical exam. Tests to find out if your condition is short-term (acute) or long-term  (chronic). Your doctor may: Check your nose for growths (polyps). Check your sinuses using a tool that has a light on one end (endoscope). Check for allergies or germs. Do imaging tests, such as an MRI or CT scan. How is this treated? Treatment for this condition depends on the cause and whether it is short-term or long-term. If caused by a virus, your symptoms should go away on their own within 10 days. You may be given medicines to relieve symptoms. They include: Medicines that shrink swollen tissue in the nose. A spray that treats swelling of the nostrils. Rinses that help get rid of thick mucus in your nose (nasal saline washes). Medicines that treat allergies (antihistamines). Over-the-counter pain relievers. If caused by bacteria, your doctor may wait to see if you will get better without treatment. You may be given antibiotic medicine if you have: A very bad infection. A weak body defense system. If caused by growths in the nose, surgery may be needed. Follow these instructions at home: Medicines Take, use, or apply over-the-counter and prescription medicines only as told by your doctor. These may include nasal sprays. If you were prescribed an antibiotic medicine, take it as told by your doctor. Do not stop taking it even if you start to feel better. Hydrate and humidify  Drink enough water to keep your pee (urine) pale yellow. Use a cool mist humidifier to keep the humidity level in your home above 50%. Breathe in steam for 10-15 minutes, 3-4 times a day, or as told by your doctor. You can do this in the bathroom while a hot shower is running. Try not to spend time in cool or dry air. Rest Rest as much as you can. Sleep with your head raised (elevated). Make sure you get enough sleep each night. General instructions  Put a warm, moist washcloth on your face 3-4 times a day, or as often as told by your doctor. Use nasal saline washes as often as told by your doctor. Wash  your hands often with soap and water. If you cannot use soap and water, use hand sanitizer. Do not smoke. Avoid being around people who are smoking (secondhand smoke). Keep all follow-up visits. Contact a doctor if: You have a fever. Your symptoms get worse. Your symptoms do not get better within 10 days. Get help right away if: You have a very bad headache. You cannot stop vomiting. You have very bad pain or swelling around your face or eyes. You have trouble seeing. You feel confused. Your neck is stiff. You have trouble breathing. These symptoms may be an emergency. Get help right away. Call 911. Do not wait to see if the symptoms will go away. Do not drive yourself to the hospital. Summary A sinus infection is swelling of your sinuses. Sinuses are hollow spaces in the bones around your face. This condition is caused by tissues in your nose that become inflamed or swollen. This traps germs. These can lead to infection. If you were prescribed an antibiotic medicine, take it as told by your doctor.  Do not stop taking it even if you start to feel better. Keep all follow-up visits. This information is not intended to replace advice given to you by your health care provider. Make sure you discuss any questions you have with your health care provider. Document Revised: 09/18/2021 Document Reviewed: 09/18/2021 Elsevier Patient Education  [place].    If you have been instructed to have an in-person evaluation today at a local Urgent Care facility, please use the link below. It will take you to a list of all of our available [facility], including address, phone number and hours of operation. Please do not delay care.  [facility]  If you or a family member do not have a primary care provider, use the link below to schedule a visit and establish care. When you choose a [facility] primary care physician or advanced practice provider, you gain a long-term  partner in health. Find a Primary Care Provider  Learn more about [facility]'s in-office and virtual care options: [facility]

## 2022-12-24 ENCOUNTER — Ambulatory Visit (INDEPENDENT_AMBULATORY_CARE_PROVIDER_SITE_OTHER): Payer: Managed Care, Other (non HMO) | Admitting: Internal Medicine

## 2022-12-24 ENCOUNTER — Encounter: Payer: Self-pay | Admitting: Internal Medicine

## 2022-12-24 VITALS — BP 98/62 | HR 100 | Ht 62.0 in | Wt 131.6 lb

## 2022-12-24 DIAGNOSIS — E782 Mixed hyperlipidemia: Secondary | ICD-10-CM

## 2022-12-24 DIAGNOSIS — J3081 Allergic rhinitis due to animal (cat) (dog) hair and dander: Secondary | ICD-10-CM

## 2022-12-24 DIAGNOSIS — F4310 Post-traumatic stress disorder, unspecified: Secondary | ICD-10-CM

## 2022-12-24 DIAGNOSIS — E039 Hypothyroidism, unspecified: Secondary | ICD-10-CM | POA: Diagnosis not present

## 2022-12-24 NOTE — Progress Notes (Signed)
Established Patient Office Visit  Subjective:  Patient ID: Theresa Walton, female    DOB: Jan 13, 1966  Age: 57 y.o. MRN: [id number]  Chief Complaint  Patient presents with   Follow-up    3 week follow up    Patient comes in for her follow-up today.  She reports that despite taking her nasal spray and allergy medications she developed a sinus infection so went to a local urgent care and now she is on Zithromax as well as Medrol Dosepak.  Today she has started to feel better and does not have sinus congestion or postnasal drip. Patient is also taking her Prazosin regularly for PTSD and notices an improvement in her quality of sleep and reduced anxiety and nervousness.  She will continue at 1 mg/day for now. Her labs show an improvement in her LDL cholesterol.  She is currently on rosuvastatin 10 mg/day.     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 ENDOSCOPY;  Service: Gastroenterology;  Laterality: N/A;   CYSTOCELE REPAIR  2013   ENDOMETRIAL ABLATION  2013   NASAL SEPTUM SURGERY  1987, 1991, 2001   PARTIAL HYSTERECTOMY  2015   PARTIAL HYSTERECTOMY Bilateral 2014   RECTOPEXY  2011   TONSILECTOMY, ADENOIDECTOMY, BILATERAL MYRINGOTOMY AND TUBES  1984   TOOTH EXTRACTION      Social History   Socioeconomic History   Marital status: Divorced    Spouse name: Not on file   Number of  children: Not on file   Years of education: Not on file   Highest education level: Not on file  Occupational History   Not on file  Tobacco Use   Smoking status: Former    Types: Cigarettes    Quit date: 05/15/2015    Years since quitting: 7.6   Smokeless tobacco: Never  Vaping Use   Vaping Use: Never used  Substance and Sexual Activity   Alcohol use: No    Alcohol/week: 0.0 standard drinks of alcohol   Drug use: No   Sexual activity: Not Currently    Partners: Male    Birth control/protection: Surgical  Other Topics Concern   Not on file  Social History Narrative   Not on file   Social Determinants of Health   Financial Resource Strain: Not on file  Food Insecurity: Not on file  Transportation Needs: Not on file  Physical Activity: Not on file  Stress: Not on file  Social Connections: Not on file  Intimate Partner Violence: Not on file    Family History  Problem Relation Age of Onset   Pulmonary fibrosis Mother    Healthy Sister    Healthy Brother    Healthy Sister    Lung cancer Paternal Grandmother    Arrhythmia Father    Heart disease Father    Prostate cancer  Neg Hx    Bladder Cancer Neg Hx    Kidney cancer Neg Hx    Breast cancer Neg Hx     Allergies  Allergen Reactions   Cefaclor Anaphylaxis   Cephalosporins Anaphylaxis   Lubiprostone     Other reaction(s): Angioedema Tongue Swelling x 2 times.  Other reaction(s): Angioedema Tongue Swelling x 2 times.    Celecoxib    Milk (Cow)     Other reaction(s): Other (See Comments) GI Upset to milk protein   Apple Juice Nausea And Vomiting   Pseudoephedrine Palpitations    Tachycardia    Review of Systems  Constitutional: Negative.   HENT: Negative.    Eyes: Negative.   Respiratory: Negative.    Cardiovascular: Negative.   Genitourinary: Negative.   Musculoskeletal: Negative.   Skin: Negative.   Neurological: Negative.   Psychiatric/Behavioral:  Positive for memory loss. The patient is  nervous/anxious and has insomnia.        Objective:   BP 98/62   Pulse 100   Ht '5\' 2"'$  (1.575 m)   Wt 131 lb 9.6 oz (59.7 kg)   SpO2 99%   BMI 24.07 kg/m   Vitals:   12/24/22 1110  BP: 98/62  Pulse: 100  Height: '5\' 2"'$  (1.575 m)  Weight: 131 lb 9.6 oz (59.7 kg)  SpO2: 99%  BMI (Calculated): 24.06    Physical Exam Vitals and nursing note reviewed.  Constitutional:      Appearance: Normal appearance.  HENT:     Nose: Nose normal.  Cardiovascular:     Rate and Rhythm: Normal rate and regular rhythm.     Pulses: Normal pulses.     Heart sounds: Normal heart sounds.  Pulmonary:     Effort: Pulmonary effort is normal.     Breath sounds: Normal breath sounds.  Abdominal:     General: Abdomen is flat. Bowel sounds are normal.     Palpations: Abdomen is soft.  Musculoskeletal:        General: Normal range of motion.     Cervical back: Normal range of motion.  Skin:    General: Skin is warm.  Neurological:     General: No focal deficit present.     Mental Status: She is alert.  Psychiatric:        Mood and Affect: Mood normal.        Behavior: Behavior normal.      No results found for any visits on 12/24/22.  Recent Results (from the past 2160 hour(s))  CMP14+EGFR     Status: Abnormal   Collection Time: 12/04/22  9:31 AM  Result Value Ref Range   Glucose 86 70 - 99 mg/dL   BUN 9 6 - 24 mg/dL   Creatinine, Ser 0.69 0.57 - 1.00 mg/dL   eGFR 101 >59 mL/min/1.73   BUN/Creatinine Ratio 13 9 - 23   Sodium 141 134 - 144 mmol/L   Potassium 4.1 3.5 - 5.2 mmol/L   Chloride 104 96 - 106 mmol/L   CO2 24 20 - 29 mmol/L   Calcium 9.5 8.7 - 10.2 mg/dL   Total Protein 6.2 6.0 - 8.5 g/dL   Albumin 4.4 3.8 - 4.9 g/dL   Globulin, Total 1.8 1.5 - 4.5 g/dL   Albumin/Globulin Ratio 2.4 (H) 1.2 - 2.2   Bilirubin Total 0.4 0.0 - 1.2 mg/dL   Alkaline Phosphatase 53 44 - 121 IU/L   AST 12 0 - 40 IU/L   ALT 14 0 -  32 IU/L  Lipid Panel w/o Chol/HDL Ratio     Status: None    Collection Time: 12/04/22  9:31 AM  Result Value Ref Range   Cholesterol, Total 144 100 - 199 mg/dL   Triglycerides 84 0 - 149 mg/dL   HDL 67 >39 mg/dL   VLDL Cholesterol Cal 16 5 - 40 mg/dL   LDL Chol Calc (NIH) 61 0 - 99 mg/dL  TSH     Status: None   Collection Time: 12/04/22  9:31 AM  Result Value Ref Range   TSH 3.880 0.450 - 4.500 uIU/mL  Vitamin D 1,25 dihydroxy     Status: None   Collection Time: 12/04/22  9:31 AM  Result Value Ref Range   Vitamin D 1, 25 (OH)2 Total 29 pg/mL    Comment: Reference Range: Adults: 21 - 65    Vitamin D2 1, 25 (OH)2 <10 pg/mL    Comment: This test was developed and its performance characteristics determined by [facility]. It has not been cleared or approved by the Food and Drug Administration.    Vitamin D3 1, 25 (OH)2 28 pg/mL    Comment: This test was developed and its performance characteristics determined by [facility]. It has not been cleared or approved by the Food and Drug Administration.       Assessment & Plan:  Patient advised to continue all her medications at this time. Problem List Items Addressed This Visit     Adult hypothyroidism   Allergic rhinitis   Mixed hyperlipidemia   Post traumatic stress disorder (PTSD) - Primary    Return in about 3 months (around 03/24/2023).   Total time spent: 30 minutes  Perrin Maltese, MD  12/24/2022

## 2022-12-30 ENCOUNTER — Ambulatory Visit: Payer: Managed Care, Other (non HMO)

## 2022-12-30 ENCOUNTER — Encounter: Payer: Self-pay | Admitting: Internal Medicine

## 2022-12-30 ENCOUNTER — Ambulatory Visit: Payer: Managed Care, Other (non HMO) | Admitting: Internal Medicine

## 2022-12-30 VITALS — BP 92/60 | HR 95 | Ht 62.0 in | Wt 129.6 lb

## 2022-12-30 DIAGNOSIS — R197 Diarrhea, unspecified: Secondary | ICD-10-CM | POA: Insufficient documentation

## 2022-12-30 DIAGNOSIS — F411 Generalized anxiety disorder: Secondary | ICD-10-CM | POA: Diagnosis not present

## 2022-12-30 DIAGNOSIS — K588 Other irritable bowel syndrome: Secondary | ICD-10-CM | POA: Diagnosis not present

## 2022-12-30 DIAGNOSIS — R634 Abnormal weight loss: Secondary | ICD-10-CM | POA: Diagnosis not present

## 2022-12-30 DIAGNOSIS — Z20822 Contact with and (suspected) exposure to covid-19: Secondary | ICD-10-CM | POA: Diagnosis not present

## 2022-12-30 DIAGNOSIS — E782 Mixed hyperlipidemia: Secondary | ICD-10-CM

## 2022-12-30 LAB — POCT XPERT XPRESS SARS COVID-2/FLU/RSV
FLU A: NEGATIVE
FLU B: NEGATIVE
RSV RNA, PCR: NEGATIVE
SARS Coronavirus 2: NEGATIVE

## 2022-12-30 NOTE — Progress Notes (Signed)
Established Patient Office Visit  Subjective:  Patient ID: Theresa Walton, female    DOB: 05/07/1966  Age: 57 y.o. MRN: [id number]  Chief Complaint  Patient presents with   Follow-up    Diarrhea, chills, congestion, back pain & fatigue    Patient comes in with reports of 1 week history of passing one  loose stool daily.  This happens only once a day and usually towards the evening.  It is not accompanied by any blood or mucus in the stools, and there is no cramps or abdominal pain associated with it.  Patient has normal appetite and is able to eat different types of foods.  She does not have any nausea or vomiting but she has been feeling tired and still has the upper respiratory symptoms for which she has been treated recently with Zithromax and Medrol Dosepak.  She is also being treated for nasal allergies. She had a negative COVID/flu/RSV test done in the office today. The loose bowel movement started last week while she was on Zithromax and she has been taking probiotics with it. Patient reports that she has lost some weight, although she is eating whatever she feels like eating.  She was advised to keep up with the fluids since her bowel movement is watery. Patient does have a history of IBS, usually constipation, and she is well aware how to manage her IBS. She has not been exposed to C. Difficile, but mentions that she was visiting somebody in the hospital before. Patient is nervous, will check some labs and also send her stool for culture and C. difficile. Meanwhile she is advised to avoid all caffeinated products, monitor her diet, change her probiotics, and try and drink Pedialyte and plenty of fluids.     Past Medical History:  Diagnosis Date   Basal cell carcinoma (BCC) of jawline 06/26/2018   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HCC)    Genital herpes    Hypothyroidism    Seasonal allergies    Syncope and collapse    Thyroid disease    hypothyroidism    Social History    Socioeconomic History   Marital status: Divorced    Spouse name: Not on file   Number of children: Not on file   Years of education: Not on file   Highest education level: Not on file  Occupational History   Not on file  Tobacco Use   Smoking status: Former    Types: Cigarettes    Quit date: 05/15/2015    Years since quitting: 7.6   Smokeless tobacco: Never  Vaping Use   Vaping Use: Never used  Substance and Sexual Activity   Alcohol use: No    Alcohol/week: 0.0 standard drinks of alcohol   Drug use: No   Sexual activity: Not Currently    Partners: Male    Birth control/protection: Surgical  Other Topics Concern   Not on file  Social History Narrative   Not on file   Social Determinants of Health   Financial Resource Strain: Not on file  Food Insecurity: Not on file  Transportation Needs: Not on file  Physical Activity: Not on file  Stress: Not on file  Social Connections: Not on file  Intimate Partner Violence: Not on file   Past Surgical History:  Procedure Laterality Date   ABDOMINAL HYSTERECTOMY     ANTERIOR CRUCIATE LIGAMENT REPAIR Right 2012   ARTHROSCOPIC REPAIR ACL  2013   Right knee   BLADDER REPAIR     BREAST  ENHANCEMENT SURGERY  1995   BREAST IMPLANT REMOVAL Bilateral 12/12/2021   COLONOSCOPY WITH PROPOFOL N/A 08/29/2017   Procedure: COLONOSCOPY WITH PROPOFOL;  Surgeon: Jonathon Bellows, MD;  Location: [facility] ENDOSCOPY;  Service: Gastroenterology;  Laterality: N/A;   CYSTOCELE REPAIR  2013   ENDOMETRIAL ABLATION  2013   NASAL SEPTUM SURGERY  1987, 1991, 2001   PARTIAL HYSTERECTOMY  2015   PARTIAL HYSTERECTOMY Bilateral 2014   RECTOPEXY  2011   TONSILECTOMY, ADENOIDECTOMY, BILATERAL MYRINGOTOMY AND TUBES  1984   TOOTH EXTRACTION       Family History  Problem Relation Age of Onset   Pulmonary fibrosis Mother    Healthy Sister    Healthy Brother    Healthy Sister    Lung cancer Paternal Grandmother    Arrhythmia Father    Heart disease Father     Prostate cancer Neg Hx    Bladder Cancer Neg Hx    Kidney cancer Neg Hx    Breast cancer Neg Hx     Allergies  Allergen Reactions   Cefaclor Anaphylaxis   Cephalosporins Anaphylaxis   Lubiprostone     Other reaction(s): Angioedema Tongue Swelling x 2 times.  Other reaction(s): Angioedema Tongue Swelling x 2 times.    Celecoxib    Milk (Cow)     Other reaction(s): Other (See Comments) GI Upset to milk protein   Apple Juice Nausea And Vomiting   Pseudoephedrine Palpitations    Tachycardia    Review of Systems  Constitutional:  Positive for malaise/fatigue and weight loss. Negative for chills, diaphoresis and fever.  HENT: Negative.    Eyes: Negative.   Cardiovascular: Negative.   Gastrointestinal:  Negative for abdominal pain, blood in stool, constipation, heartburn, melena, nausea and vomiting.  Genitourinary: Negative.   Musculoskeletal: Negative.   Skin: Negative.   Neurological: Negative.   Endo/Heme/Allergies: Negative.   Psychiatric/Behavioral: Negative.         Objective:   BP 92/60   Pulse 95   Wt 129 lb 9.6 oz (58.8 kg)   SpO2 99%   BMI 23.70 kg/m   Vitals:   12/30/22 1449  BP: 92/60  Pulse: 95  Weight: 129 lb 9.6 oz (58.8 kg)  SpO2: 99%  BMI (Calculated): 23.7    Physical Exam Vitals and nursing note reviewed.  Constitutional:      Appearance: Normal appearance.  HENT:     Head: Normocephalic.  Cardiovascular:     Rate and Rhythm: Normal rate and regular rhythm.     Pulses: Normal pulses.     Heart sounds: Normal heart sounds.  Pulmonary:     Effort: Pulmonary effort is normal.     Breath sounds: Normal breath sounds.  Abdominal:     General: Abdomen is flat. Bowel sounds are normal. There is distension.     Palpations: Abdomen is soft. There is no mass.     Tenderness: There is no abdominal tenderness. There is no guarding.     Hernia: No hernia is present.  Musculoskeletal:        General: Normal range of motion.     Cervical  back: Normal range of motion and neck supple.  Skin:    General: Skin is warm and dry.  Neurological:     General: No focal deficit present.     Mental Status: She is alert and oriented to person, place, and time.      Results for orders placed or performed in visit on 12/30/22  POCT XPERT  XPRESS SARS COVID-2/FLU/RSV [id number]  Result Value Ref Range   SARS Coronavirus 2 Negative    FLU A Negative    FLU B Negative    RSV RNA, PCR Negative     Recent Results (from the past 2160 hour(s))  CMP14+EGFR     Status: Abnormal   Collection Time: 12/04/22  9:31 AM  Result Value Ref Range   Glucose 86 70 - 99 mg/dL   BUN 9 6 - 24 mg/dL   Creatinine, Ser 0.69 0.57 - 1.00 mg/dL   eGFR 101 >59 mL/min/1.73   BUN/Creatinine Ratio 13 9 - 23   Sodium 141 134 - 144 mmol/L   Potassium 4.1 3.5 - 5.2 mmol/L   Chloride 104 96 - 106 mmol/L   CO2 24 20 - 29 mmol/L   Calcium 9.5 8.7 - 10.2 mg/dL   Total Protein 6.2 6.0 - 8.5 g/dL   Albumin 4.4 3.8 - 4.9 g/dL   Globulin, Total 1.8 1.5 - 4.5 g/dL   Albumin/Globulin Ratio 2.4 (H) 1.2 - 2.2   Bilirubin Total 0.4 0.0 - 1.2 mg/dL   Alkaline Phosphatase 53 44 - 121 IU/L   AST 12 0 - 40 IU/L   ALT 14 0 - 32 IU/L  Lipid Panel w/o Chol/HDL Ratio     Status: None   Collection Time: 12/04/22  9:31 AM  Result Value Ref Range   Cholesterol, Total 144 100 - 199 mg/dL   Triglycerides 84 0 - 149 mg/dL   HDL 67 >39 mg/dL   VLDL Cholesterol Cal 16 5 - 40 mg/dL   LDL Chol Calc (NIH) 61 0 - 99 mg/dL  TSH     Status: None   Collection Time: 12/04/22  9:31 AM  Result Value Ref Range   TSH 3.880 0.450 - 4.500 uIU/mL  Vitamin D 1,25 dihydroxy     Status: None   Collection Time: 12/04/22  9:31 AM  Result Value Ref Range   Vitamin D 1, 25 (OH)2 Total 29 pg/mL    Comment: Reference Range: Adults: 21 - 65    Vitamin D2 1, 25 (OH)2 <10 pg/mL    Comment: This test was developed and its performance characteristics determined by [facility]. It has not been  cleared or approved by the Food and Drug Administration.    Vitamin D3 1, 25 (OH)2 28 pg/mL    Comment: This test was developed and its performance characteristics determined by [facility]. It has not been cleared or approved by the Food and Drug Administration.   POCT XPERT XPRESS SARS COVID-2/FLU/RSV [id number]     Status: Normal   Collection Time: 12/30/22  4:15 PM  Result Value Ref Range   SARS Coronavirus 2 Negative    FLU A Negative    FLU B Negative    RSV RNA, PCR Negative       Assessment & Plan:  Maintain p.o. fluids, try Pedialyte, change her fiber.  Check labs and stool sample. Problem List Items Addressed This Visit     Mixed hyperlipidemia   GAD (generalized anxiety disorder)   Other irritable bowel syndrome   Diarrhea - Primary   Relevant Orders   CMP14+EGFR   CBC With Differential   Lipase   Culture, Stool   Clostridium difficile culture-fecal   POCT XPERT XPRESS SARS COVID-2/FLU/RSV [id number] (Completed)   Weight loss, unintentional   Relevant Orders   CMP14+EGFR   CBC With Differential   Lipase   Culture, Stool   Clostridium difficile culture-fecal  Return in about 10 days (around 01/09/2023).   Total time spent: 30 minutes  Perrin Maltese, MD  12/30/2022

## 2022-12-31 LAB — CBC WITH DIFFERENTIAL
Basophils Absolute: 0.1 10*3/uL (ref 0.0–0.2)
Basos: 1 %
EOS (ABSOLUTE): 0.1 10*3/uL (ref 0.0–0.4)
Eos: 2 %
Hematocrit: 43.7 % (ref 34.0–46.6)
Hemoglobin: 14.3 g/dL (ref 11.1–15.9)
Immature Grans (Abs): 0 10*3/uL (ref 0.0–0.1)
Immature Granulocytes: 0 %
Lymphocytes Absolute: 2.6 10*3/uL (ref 0.7–3.1)
Lymphs: 38 %
MCH: 30.5 pg (ref 26.6–33.0)
MCHC: 32.7 g/dL (ref 31.5–35.7)
MCV: 93 fL (ref 79–97)
Monocytes Absolute: 0.8 10*3/uL (ref 0.1–0.9)
Monocytes: 11 %
Neutrophils Absolute: 3.3 10*3/uL (ref 1.4–7.0)
Neutrophils: 48 %
RBC: 4.69 x10E6/uL (ref 3.77–5.28)
RDW: 11.9 % (ref 11.7–15.4)
WBC: 7 10*3/uL (ref 3.4–10.8)

## 2022-12-31 LAB — LIPASE: Lipase: 47 U/L (ref 14–72)

## 2022-12-31 LAB — CMP14+EGFR
ALT: 19 IU/L (ref 0–32)
AST: 18 IU/L (ref 0–40)
Albumin/Globulin Ratio: 2 (ref 1.2–2.2)
Albumin: 4.5 g/dL (ref 3.8–4.9)
Alkaline Phosphatase: 64 IU/L (ref 44–121)
BUN/Creatinine Ratio: 10 (ref 9–23)
BUN: 9 mg/dL (ref 6–24)
Bilirubin Total: 0.2 mg/dL (ref 0.0–1.2)
CO2: 23 mmol/L (ref 20–29)
Calcium: 10 mg/dL (ref 8.7–10.2)
Chloride: 103 mmol/L (ref 96–106)
Creatinine, Ser: 0.86 mg/dL (ref 0.57–1.00)
Globulin, Total: 2.2 g/dL (ref 1.5–4.5)
Glucose: 67 mg/dL — ABNORMAL LOW (ref 70–99)
Potassium: 4.3 mmol/L (ref 3.5–5.2)
Sodium: 139 mmol/L (ref 134–144)
Total Protein: 6.7 g/dL (ref 6.0–8.5)
eGFR: 78 mL/min/{1.73_m2} (ref >59)

## 2023-01-01 ENCOUNTER — Telehealth: Payer: Self-pay

## 2023-01-01 NOTE — Telephone Encounter (Signed)
Erroneous

## 2023-01-06 LAB — STOOL CULTURE: E coli, Shiga toxin Assay: NEGATIVE

## 2023-01-09 ENCOUNTER — Ambulatory Visit: Payer: Managed Care, Other (non HMO) | Admitting: Internal Medicine

## 2023-01-09 ENCOUNTER — Encounter: Payer: Self-pay | Admitting: Internal Medicine

## 2023-01-09 VITALS — BP 98/62 | HR 95 | Ht 62.0 in | Wt 131.8 lb

## 2023-01-09 DIAGNOSIS — E782 Mixed hyperlipidemia: Secondary | ICD-10-CM | POA: Diagnosis not present

## 2023-01-09 DIAGNOSIS — R197 Diarrhea, unspecified: Secondary | ICD-10-CM

## 2023-01-09 DIAGNOSIS — E039 Hypothyroidism, unspecified: Secondary | ICD-10-CM

## 2023-01-09 DIAGNOSIS — J309 Allergic rhinitis, unspecified: Secondary | ICD-10-CM | POA: Diagnosis not present

## 2023-01-09 DIAGNOSIS — F4310 Post-traumatic stress disorder, unspecified: Secondary | ICD-10-CM

## 2023-01-09 NOTE — Progress Notes (Signed)
Established Patient Office Visit  Subjective:  Patient ID: Theresa Walton, female    DOB: February 09, 1966  Age: 57 y.o. MRN: [id number]  Chief Complaint  Patient presents with   Follow-up    10 day follow up    Patient comes in for follow-up of her diabetes.  Her labs were done which were essentially unremarkable.  Stool was negative close usual pathogens, but the C. difficile result is still pending. Patient however is feeling a little better today.  She states she stopped most of her medications except thyroid, to see if it changes anything but she continues to have loose bowel movement although not every day.  It is possible that her IBS with constipation is now changing to IBS with diarrhea. She is able to gain a few pounds since the last visit.  She has also changed her probiotic. Patient will now start resuming her medications one at a time. Patient will make an appointment to see her gastroenterologist.     Past Medical History:  Diagnosis Date   Basal cell carcinoma (BCC) of jawline 06/26/2018   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[place])    Genital herpes    Hypothyroidism    Seasonal allergies    Syncope and collapse    Thyroid disease    hypothyroidism    Past Surgical History:  Procedure Laterality Date   ABDOMINAL HYSTERECTOMY     ANTERIOR CRUCIATE LIGAMENT REPAIR Right 2012   ARTHROSCOPIC REPAIR ACL  2013   Right knee   BLADDER REPAIR     BREAST ENHANCEMENT SURGERY  1995   BREAST IMPLANT REMOVAL Bilateral 12/12/2021   COLONOSCOPY WITH PROPOFOL N/A 08/29/2017   Procedure: COLONOSCOPY WITH PROPOFOL;  Surgeon: Jonathon Bellows, MD;  Location: [facility] ENDOSCOPY;  Service: Gastroenterology;  Laterality: N/A;   CYSTOCELE REPAIR  2013   ENDOMETRIAL ABLATION  2013   NASAL SEPTUM SURGERY  1987, 1991, 2001   PARTIAL HYSTERECTOMY  2015   PARTIAL HYSTERECTOMY Bilateral 2014   RECTOPEXY  2011   TONSILECTOMY, ADENOIDECTOMY, BILATERAL MYRINGOTOMY AND TUBES  1984   TOOTH EXTRACTION       Social History   Socioeconomic History   Marital status: Divorced    Spouse name: Not on file   Number of children: Not on file   Years of education: Not on file   Highest education level: Not on file  Occupational History   Not on file  Tobacco Use   Smoking status: Former    Types: Cigarettes    Quit date: 05/15/2015    Years since quitting: 7.6   Smokeless tobacco: Never  Vaping Use   Vaping Use: Never used  Substance and Sexual Activity   Alcohol use: No    Alcohol/week: 0.0 standard drinks of alcohol   Drug use: No   Sexual activity: Not Currently    Partners: Male    Birth control/protection: Surgical  Other Topics Concern   Not on file  Social History Narrative   Not on file   Social Determinants of Health   Financial Resource Strain: Not on file  Food Insecurity: Not on file  Transportation Needs: Not on file  Physical Activity: Not on file  Stress: Not on file  Social Connections: Not on file  Intimate Partner Violence: Not on file    Family History  Problem Relation Age of Onset   Pulmonary fibrosis Mother    Healthy Sister    Healthy Brother    Healthy Sister    Lung cancer Paternal  Grandmother    Arrhythmia Father    Heart disease Father    Prostate cancer Neg Hx    Bladder Cancer Neg Hx    Kidney cancer Neg Hx    Breast cancer Neg Hx     Allergies  Allergen Reactions   Cefaclor Anaphylaxis   Cephalosporins Anaphylaxis   Lubiprostone     Other reaction(s): Angioedema Tongue Swelling x 2 times.  Other reaction(s): Angioedema Tongue Swelling x 2 times.    Celecoxib    Milk (Cow)     Other reaction(s): Other (See Comments) GI Upset to milk protein   Apple Juice Nausea And Vomiting   Pseudoephedrine Palpitations    Tachycardia    Review of Systems  Constitutional: Negative.   HENT: Negative.    Eyes: Negative.   Respiratory: Negative.    Cardiovascular: Negative.   Gastrointestinal:  Positive for diarrhea. Negative for  abdominal pain, blood in stool, constipation, heartburn, melena, nausea and vomiting.  Musculoskeletal: Negative.   Skin: Negative.   Neurological: Negative.   Psychiatric/Behavioral:  The patient is nervous/anxious.        Objective:   BP 98/62   Pulse 95   Ht '5\' 2"'$  (1.575 m)   Wt 131 lb 12.8 oz (59.8 kg)   SpO2 98%   BMI 24.11 kg/m   Vitals:   01/09/23 1404  BP: 98/62  Pulse: 95  Height: '5\' 2"'$  (1.575 m)  Weight: 131 lb 12.8 oz (59.8 kg)  SpO2: 98%  BMI (Calculated): 24.1    Physical Exam Vitals and nursing note reviewed.  Constitutional:      Appearance: Normal appearance.  HENT:     Nose: Nose normal.  Cardiovascular:     Rate and Rhythm: Normal rate and regular rhythm.  Pulmonary:     Effort: Pulmonary effort is normal.     Breath sounds: Normal breath sounds.  Abdominal:     General: Abdomen is flat. Bowel sounds are normal. There is no distension.     Palpations: Abdomen is soft. There is no mass.     Tenderness: There is no abdominal tenderness.     Hernia: No hernia is present.  Musculoskeletal:        General: Normal range of motion.     Cervical back: Normal range of motion and neck supple.  Skin:    General: Skin is warm.  Neurological:     General: No focal deficit present.     Mental Status: She is alert and oriented to person, place, and time.      No results found for any visits on 01/09/23.  Recent Results (from the past 2160 hour(s))  CMP14+EGFR     Status: Abnormal   Collection Time: 12/04/22  9:31 AM  Result Value Ref Range   Glucose 86 70 - 99 mg/dL   BUN 9 6 - 24 mg/dL   Creatinine, Ser 0.69 0.57 - 1.00 mg/dL   eGFR 101 >59 mL/min/1.73   BUN/Creatinine Ratio 13 9 - 23   Sodium 141 134 - 144 mmol/L   Potassium 4.1 3.5 - 5.2 mmol/L   Chloride 104 96 - 106 mmol/L   CO2 24 20 - 29 mmol/L   Calcium 9.5 8.7 - 10.2 mg/dL   Total Protein 6.2 6.0 - 8.5 g/dL   Albumin 4.4 3.8 - 4.9 g/dL   Globulin, Total 1.8 1.5 - 4.5 g/dL    Albumin/Globulin Ratio 2.4 (H) 1.2 - 2.2   Bilirubin Total 0.4 0.0 - 1.2  mg/dL   Alkaline Phosphatase 53 44 - 121 IU/L   AST 12 0 - 40 IU/L   ALT 14 0 - 32 IU/L  Lipid Panel w/o Chol/HDL Ratio     Status: None   Collection Time: 12/04/22  9:31 AM  Result Value Ref Range   Cholesterol, Total 144 100 - 199 mg/dL   Triglycerides 84 0 - 149 mg/dL   HDL 67 >39 mg/dL   VLDL Cholesterol Cal 16 5 - 40 mg/dL   LDL Chol Calc (NIH) 61 0 - 99 mg/dL  TSH     Status: None   Collection Time: 12/04/22  9:31 AM  Result Value Ref Range   TSH 3.880 0.450 - 4.500 uIU/mL  Vitamin D 1,25 dihydroxy     Status: None   Collection Time: 12/04/22  9:31 AM  Result Value Ref Range   Vitamin D 1, 25 (OH)2 Total 29 pg/mL    Comment: Reference Range: Adults: 21 - 65    Vitamin D2 1, 25 (OH)2 <10 pg/mL    Comment: This test was developed and its performance characteristics determined by [facility]. It has not been cleared or approved by the Food and Drug Administration.    Vitamin D3 1, 25 (OH)2 28 pg/mL    Comment: This test was developed and its performance characteristics determined by [facility]. It has not been cleared or approved by the Food and Drug Administration.   CMP14+EGFR     Status: Abnormal   Collection Time: 12/30/22  3:42 PM  Result Value Ref Range   Glucose 67 (L) 70 - 99 mg/dL   BUN 9 6 - 24 mg/dL   Creatinine, Ser 0.86 0.57 - 1.00 mg/dL   eGFR 78 >59 mL/min/1.73   BUN/Creatinine Ratio 10 9 - 23   Sodium 139 134 - 144 mmol/L   Potassium 4.3 3.5 - 5.2 mmol/L   Chloride 103 96 - 106 mmol/L   CO2 23 20 - 29 mmol/L   Calcium 10.0 8.7 - 10.2 mg/dL   Total Protein 6.7 6.0 - 8.5 g/dL   Albumin 4.5 3.8 - 4.9 g/dL   Globulin, Total 2.2 1.5 - 4.5 g/dL   Albumin/Globulin Ratio 2.0 1.2 - 2.2   Bilirubin Total 0.2 0.0 - 1.2 mg/dL   Alkaline Phosphatase 64 44 - 121 IU/L   AST 18 0 - 40 IU/L   ALT 19 0 - 32 IU/L  CBC With Differential     Status: None   Collection Time: 12/30/22  3:42 PM   Result Value Ref Range   WBC 7.0 3.4 - 10.8 x10E3/uL   RBC 4.69 3.77 - 5.28 x10E6/uL   Hemoglobin 14.3 11.1 - 15.9 g/dL   Hematocrit 43.7 34.0 - 46.6 %   MCV 93 79 - 97 fL   MCH 30.5 26.6 - 33.0 pg   MCHC 32.7 31.5 - 35.7 g/dL   RDW 11.9 11.7 - 15.4 %   Neutrophils 48 Not Estab. %   Lymphs 38 Not Estab. %   Monocytes 11 Not Estab. %   Eos 2 Not Estab. %   Basos 1 Not Estab. %   Neutrophils Absolute 3.3 1.4 - 7.0 x10E3/uL   Lymphocytes Absolute 2.6 0.7 - 3.1 x10E3/uL   Monocytes Absolute 0.8 0.1 - 0.9 x10E3/uL   EOS (ABSOLUTE) 0.1 0.0 - 0.4 x10E3/uL   Basophils Absolute 0.1 0.0 - 0.2 x10E3/uL   Immature Granulocytes 0 Not Estab. %   Immature Grans (Abs) 0.0 0.0 - 0.1 x10E3/uL  Lipase     Status: None   Collection Time: 12/30/22  3:42 PM  Result Value Ref Range   Lipase 47 14 - 72 U/L  POCT XPERT XPRESS SARS COVID-2/FLU/RSV [id number]     Status: Normal   Collection Time: 12/30/22  4:15 PM  Result Value Ref Range   SARS Coronavirus 2 Negative    FLU A Negative    FLU B Negative    RSV RNA, PCR Negative   Culture, Stool     Status: None   Collection Time: 01/02/23  2:18 PM   Specimen: Stool   ST  Result Value Ref Range   Salmonella/Shigella Screen Final report    Stool Culture result 1 (RSASHR) Comment     Comment: No Salmonella or Shigella recovered.   Campylobacter Culture Final report    Stool Culture result 1 (CMPCXR) Comment     Comment: No Campylobacter species isolated.   E coli, Shiga toxin Assay Negative Negative      Assessment & Plan:  Patient will continue her current medications.  We will send order for C. difficile again. Problem List Items Addressed This Visit     Adult hypothyroidism   Allergic rhinitis   Mixed hyperlipidemia   Post traumatic stress disorder (PTSD)   Diarrhea - Primary   Relevant Orders   Clostridium difficile Toxin A/B   Fecal leukocytes    Return in about 3 months (around 04/11/2023).   Total time spent: 30  minutes  Perrin Maltese, MD  01/09/2023

## 2023-01-22 ENCOUNTER — Encounter: Payer: Self-pay | Admitting: Family Medicine

## 2023-02-18 ENCOUNTER — Other Ambulatory Visit: Payer: Self-pay | Admitting: Internal Medicine

## 2023-02-18 DIAGNOSIS — G4700 Insomnia, unspecified: Secondary | ICD-10-CM

## 2023-02-18 NOTE — Telephone Encounter (Signed)
Last 11/23 and next 5/24

## 2023-03-13 ENCOUNTER — Encounter: Payer: Self-pay | Admitting: Physician Assistant

## 2023-03-13 ENCOUNTER — Ambulatory Visit: Payer: Managed Care, Other (non HMO) | Admitting: Physician Assistant

## 2023-03-13 DIAGNOSIS — G4700 Insomnia, unspecified: Secondary | ICD-10-CM

## 2023-03-13 MED ORDER — ZALEPLON 10 MG PO CAPS: ORAL_CAPSULE | ORAL | 5 refills | Status: DC

## 2023-03-13 MED ORDER — ESZOPICLONE 1 MG PO TABS
1.0000 mg | ORAL_TABLET | Freq: Every evening | ORAL | 0 refills | Status: DC | PRN
Start: 2023-03-13 — End: 2023-05-26

## 2023-03-13 NOTE — Progress Notes (Signed)
[facility] [address] [place], [place] [address]  Pulmonary Sleep Medicine   Office Visit Note  Patient Name: Theresa Walton DOB: 1965-11-27 MRN [id number]  Date of Service: 03/13/2023  Complaints/HPI: Pt is here for routine follow up for insomnia. Was switched to zaleplon last visit. Has been doing ok with this. Doesn't make her go to sleep as well as lunesta did, but once asleep does well and doesn't have as much grogginess in AM. She would like to have lunesta on hand for bad nights where she really doesn't think she can fall asleep. Understands not to combine with zaleplon. Used to see psych, only does counseling now. PCP did put her on prazosin due to PTSD which likely contributes to her poor sleep. Does have some daytime sleepiness. Has stopped anti-histamines because they didn't seem to help and is just waiting out the pollen now. Is seeing ENT now for sinuses as well.   ROS  General: (-) fever, (-) chills, (-) night sweats, (-) weakness Skin: (-) rashes, (-) itching,. Eyes: (-) visual changes, (-) redness, (-) itching. Nose and Sinuses: (-) nasal stuffiness or itchiness, (-) postnasal drip, (-) nosebleeds, (-) sinus trouble. Mouth and Throat: (-) sore throat, (-) hoarseness. Neck: (-) swollen glands, (-) enlarged thyroid, (-) neck pain. Respiratory: - cough, (-) bloody sputum, - shortness of breath, - wheezing. Cardiovascular: - ankle swelling, (-) chest pain. Lymphatic: (-) lymph node enlargement. Neurologic: (-) numbness, (-) tingling. Psychiatric: (-) anxiety, (-) depression   Current Medication: Outpatient Encounter Medications as of 03/13/2023  Medication Sig   cyclobenzaprine (FLEXERIL) 5 MG tablet TAKE 1 TO 2 TABLETS BY MOUTH AT BEDTIME   DULoxetine (CYMBALTA) 30 MG capsule Take by mouth.   estradiol (VIVELLE-DOT) 0.025 MG/24HR Place 1 patch onto the skin 2 (two) times a week.   eszopiclone (LUNESTA) 1 MG TABS tablet Take 1 tablet (1 mg total) by  mouth at bedtime as needed for sleep. Take immediately before bedtime. Only to be used as needed and do not combine with other sleep aids such as sonata.   levothyroxine (SYNTHROID) 50 MCG tablet TAKE 1 TABLET(50 MCG) BY MOUTH DAILY   prazosin (MINIPRESS) 1 MG capsule Take 1 capsule (1 mg total) by mouth at bedtime.   rosuvastatin (CRESTOR) 10 MG tablet Take 10 mg by mouth daily.   triamcinolone cream (KENALOG) 0.1 % Apply 1 application topically 2 (two) times daily.   Trospium Chloride 60 MG CP24 Take 1 capsule (60 mg total) by mouth daily.   [DISCONTINUED] montelukast (SINGULAIR) 10 MG tablet Take 1 tablet (10 mg total) by mouth daily.   [DISCONTINUED] zaleplon (SONATA) 10 MG capsule TAKE 1 CAPSULE BY MOUTH AT BEDTIME AS NEEDED FOR SLEEP   azelastine (ASTELIN) 0.1 % nasal spray Place 1 spray into both nostrils 2 (two) times daily.   zaleplon (SONATA) 10 MG capsule TAKE 1 CAPSULE BY MOUTH AT BEDTIME AS NEEDED FOR SLEEP   No facility-administered encounter medications on file as of 03/13/2023.    Surgical History: Past Surgical History:  Procedure Laterality Date   ABDOMINAL HYSTERECTOMY     ANTERIOR CRUCIATE LIGAMENT REPAIR Right 2012   ARTHROSCOPIC REPAIR ACL  2013   Right knee   BLADDER REPAIR     BREAST ENHANCEMENT SURGERY  1995   BREAST IMPLANT REMOVAL Bilateral 12/12/2021   COLONOSCOPY WITH PROPOFOL N/A 08/29/2017   Procedure: COLONOSCOPY WITH PROPOFOL;  Surgeon: Wyline Mood, MD;  [facility] END[facility]COPY;  Service: Gastroenterology;  Laterality: N/A;  CYSTOCELE REPAIR  2013   ENDOMETRIAL ABLATION  2013   NASAL SEPTUM SURGERY  1987, 1991, 2001   PARTIAL HYSTERECTOMY  2015   PARTIAL HYSTERECTOMY Bilateral 2014   RECTOPEXY  2011   TONSILECTOMY, ADENOIDECTOMY, BILATERAL MYRINGOTOMY AND TUBES  1984   TOOTH EXTRACTION      Medical History: Past Medical History:  Diagnosis Date   Basal cell carcinoma (BCC) of jawline 06/26/2018   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HCC)    Genital  herpes    Hypothyroidism    Seasonal allergies    Syncope and collapse    Thyroid disease    hypothyroidism    Family History: Family History  Problem Relation Age of Onset   Pulmonary fibrosis Mother    Healthy Sister    Healthy Brother    Healthy Sister    Lung cancer Paternal Grandmother    Arrhythmia Father    Heart disease Father    Prostate cancer Neg Hx    Bladder Cancer Neg Hx    Kidney cancer Neg Hx    Breast cancer Neg Hx     Social History: Social History   Socioeconomic History   Marital status: Divorced    Spouse name: Not on file   Number of children: Not on file   Years of education: Not on file   Highest education level: Not on file  Occupational History   Not on file  Tobacco Use   Smoking status: Former    Types: Cigarettes    Quit date: 05/15/2015    Years since quitting: 7.8   Smokeless tobacco: Never  Vaping Use   Vaping Use: Never used  Substance and Sexual Activity   Alcohol use: No    Alcohol/week: 0.0 standard drinks of alcohol   Drug use: No   Sexual activity: Not Currently    Partners: Male    Birth control/protection: Surgical  Other Topics Concern   Not on file  Social History Narrative   Not on file   Social Determinants of Health   Financial Resource Strain: Not on file  Food Insecurity: Not on file  Transportation Needs: Not on file  Physical Activity: Not on file  Stress: Not on file  Social Connections: Not on file  Intimate Partner Violence: Not on file    Vital Signs: Blood pressure 110/80, pulse 96, temperature 97.7 F (36.5 C), resp. rate 16, height 5\' 2"  (1.575 m), weight 132 lb 6.4 oz (60.1 kg), SpO2 99 %.  Examination: General Appearance: The patient is well-developed, well-nourished, and in no distress. Skin: Gross inspection of skin unremarkable. Head: normocephalic, no gross deformities. Eyes: no gross deformities noted. ENT: ears appear grossly normal no exudates. Neck: Supple. No thyromegaly. No  LAD. Respiratory: Lungs clear to auscultation. Cardiovascular: Normal S1 and S2 without murmur or rub. Extremities: No cyanosis. pulses are equal. Neurologic: Alert and oriented. No involuntary movements.  LABS: Recent Results (from the past 2160 hour(s))  CMP14+EGFR     Status: Abnormal   Collection Time: 12/30/22  3:42 PM  Result Value Ref Range   Glucose 67 (L) 70 - 99 mg/dL   BUN 9 6 - 24 mg/dL   Creatinine, Ser 1.61 0.57 - 1.00 mg/dL   eGFR 78 >09 UE/AVW/0.98   BUN/Creatinine Ratio 10 9 - 23   Sodium 139 134 - 144 mmol/L   Potassium 4.3 3.5 - 5.2 mmol/L   Chloride 103 96 - 106 mmol/L   CO2 23 20 - 29 mmol/L  Calcium 10.0 8.7 - 10.2 mg/dL   Total Protein 6.7 6.0 - 8.5 g/dL   Albumin 4.5 3.8 - 4.9 g/dL   Globulin, Total 2.2 1.5 - 4.5 g/dL   Albumin/Globulin Ratio 2.0 1.2 - 2.2   Bilirubin Total 0.2 0.0 - 1.2 mg/dL   Alkaline Phosphatase 64 44 - 121 IU/L   AST 18 0 - 40 IU/L   ALT 19 0 - 32 IU/L  CBC With Differential     Status: None   Collection Time: 12/30/22  3:42 PM  Result Value Ref Range   WBC 7.0 3.4 - 10.8 x10E3/uL   RBC 4.69 3.77 - 5.28 x10E6/uL   Hemoglobin 14.3 11.1 - 15.9 g/dL   Hematocrit 65.7 84.6 - 46.6 %   MCV 93 79 - 97 fL   MCH 30.5 26.6 - 33.0 pg   MCHC 32.7 31.5 - 35.7 g/dL   RDW 96.2 95.2 - 84.1 %   Neutrophils 48 Not Estab. %   Lymphs 38 Not Estab. %   Monocytes 11 Not Estab. %   Eos 2 Not Estab. %   Basos 1 Not Estab. %   Neutrophils Absolute 3.3 1.4 - 7.0 x10E3/uL   Lymphocytes Absolute 2.6 0.7 - 3.1 x10E3/uL   Monocytes Absolute 0.8 0.1 - 0.9 x10E3/uL   EOS (ABSOLUTE) 0.1 0.0 - 0.4 x10E3/uL   Basophils Absolute 0.1 0.0 - 0.2 x10E3/uL   Immature Granulocytes 0 Not Estab. %   Immature Grans (Abs) 0.0 0.0 - 0.1 x10E3/uL  Lipase     Status: None   Collection Time: 12/30/22  3:42 PM  Result Value Ref Range   Lipase 47 14 - 72 U/L  POCT XPERT XPRESS SARS COVID-2/FLU/RSV [[id number]]     Status: Normal   Collection Time: 12/30/22  4:15 PM   Result Value Ref Range   SARS Coronavirus 2 Negative    FLU A Negative    FLU B Negative    RSV RNA, PCR Negative   Culture, Stool     Status: None   Collection Time: 01/02/23  2:18 PM   Specimen: Stool   ST  Result Value Ref Range   Salmonella/Shigella Screen Final report    Stool Culture result 1 (RSASHR) Comment     Comment: No Salmonella or Shigella recovered.   Campylobacter Culture Final report    Stool Culture result 1 (CMPCXR) Comment     Comment: No Campylobacter species isolated.   E coli, Shiga toxin Assay Negative Negative    Radiology: MM 3D SCREEN BREAST BILATERAL  Result Date: 08/12/2022 CLINICAL DATA:  Screening. EXAM: DIGITAL SCREENING BILATERAL MAMMOGRAM WITH TOMOSYNTHESIS AND CAD TECHNIQUE: Bilateral screening digital craniocaudal and mediolateral oblique mammograms were obtained. Bilateral screening digital breast tomosynthesis was performed. The images were evaluated with computer-aided detection. COMPARISON:  Previous exam(s). ACR Breast Density Category c: The breast tissue is heterogeneously dense, which may obscure small masses. FINDINGS: There are no findings suspicious for malignancy. Interval explantation of implants. IMPRESSION: No mammographic evidence of malignancy. A result letter of this screening mammogram will be mailed directly to the patient. RECOMMENDATION: Screening mammogram in one year. (Code:SM-B-01Y) BI-RADS CATEGORY  1: Negative. Electronically Signed   By: Meda Klinefelter M.D.   On: 08/12/2022 10:31    No results found.  No results found.    Assessment and Plan: Patient Active Problem List   Diagnosis Date Noted   GAD (generalized anxiety disorder) 12/30/2022   Other irritable bowel syndrome 12/30/2022   Diarrhea 12/30/2022  Weight loss, unintentional 12/30/2022   Chronic post-traumatic stress disorder (PTSD) 12/03/2022   Mixed hyperlipidemia 12/03/2022   Post traumatic stress disorder (PTSD) 12/03/2022   Fatigue 03/01/2022    Situational anxiety 06/29/2021   Need for shingles vaccine 06/29/2021   Chronic fatigue 06/29/2021   Situational stress 06/29/2021   Pain in joint, lower leg 06/28/2021   Fatigue after COVID-19 vaccination 06/13/2021   Edema 08/05/2018   Impingement syndrome of shoulder region 08/05/2018   Knee joint effusion 08/05/2018   Old anterior cruciate ligament disruption 08/05/2018   Patellar tendonitis 08/05/2018   Basal cell carcinoma (BCC) of jawline 06/26/2018   History of pubovaginal sling 04/01/2017   Essential tremor 05/15/2016   Allergic rhinitis 06/22/2015   B12 deficiency 06/21/2015   H/O: hysterectomy 06/21/2015   Hypertriglyceridemia 06/21/2015   Hypoglycemia 06/21/2015   Menopausal and perimenopausal disorder 06/21/2015   Vasovagal symptom 06/21/2015   Insomnia 04/19/2015   Female stress incontinence 03/24/2012   Adult hypothyroidism 08/28/2009   Vitamin D insufficiency 08/28/2009   Chronic infection of sinus 02/01/2009   Adaptation reaction 09/09/2008   Genital herpes simplex type 1 infection 07/13/2008    1. Mixed insomnia Will continue with Sonata, pt given single script for lunesta to have on hand only for really bad nights and understands that she cannot combine these medications. - eszopiclone (LUNESTA) 1 MG TABS tablet; Take 1 tablet (1 mg total) by mouth at bedtime as needed for sleep. Take immediately before bedtime. Only to be used as needed and do not combine with other sleep aids such as sonata.  Dispense: 30 tablet; Refill: 0 - zaleplon (SONATA) 10 MG capsule; TAKE 1 CAPSULE BY MOUTH AT BEDTIME AS NEEDED FOR SLEEP  Dispense: 30 capsule; Refill: 5   General Counseling: I have discussed the findings of the evaluation and examination with Trysta.  I have also discussed any further diagnostic evaluation thatmay be needed or ordered today. Lorrene verbalizes understanding of the findings of todays visit. We also reviewed her medications today and discussed drug  interactions and side effects including but not limited excessive drowsiness and altered mental states. We also discussed that there is always a risk not just to her but also people around her. she has been encouraged to call the office with any questions or concerns that should arise related to todays visit.  No orders of the defined types were placed in this encounter.    Time spent: 30  I have personally obtained a history, examined the patient, evaluated laboratory and imaging results, formulated the assessment and plan and placed orders. This patient was seen by Lynn Ito, PA-C in collaboration with Dr. Freda Munro as a part of collaborative care agreement.     Yevonne Pax, MD [facility] Pulmonary and Critical Care Sleep medicine

## 2023-03-17 ENCOUNTER — Other Ambulatory Visit: Payer: Self-pay | Admitting: Internal Medicine

## 2023-03-17 DIAGNOSIS — B009 Herpesviral infection, unspecified: Secondary | ICD-10-CM

## 2023-03-25 ENCOUNTER — Ambulatory Visit: Payer: Managed Care, Other (non HMO) | Admitting: Internal Medicine

## 2023-03-26 ENCOUNTER — Telehealth: Payer: Managed Care, Other (non HMO) | Admitting: Physician Assistant

## 2023-03-26 DIAGNOSIS — B9689 Other specified bacterial agents as the cause of diseases classified elsewhere: Secondary | ICD-10-CM

## 2023-03-26 DIAGNOSIS — J069 Acute upper respiratory infection, unspecified: Secondary | ICD-10-CM

## 2023-03-26 DIAGNOSIS — B49 Unspecified mycosis: Secondary | ICD-10-CM

## 2023-03-26 DIAGNOSIS — R112 Nausea with vomiting, unspecified: Secondary | ICD-10-CM

## 2023-03-26 MED ORDER — AMOXICILLIN-POT CLAVULANATE 875-125 MG PO TABS
1.0000 | ORAL_TABLET | Freq: Two times a day (BID) | ORAL | 0 refills | Status: DC
Start: 2023-03-26 — End: 2023-04-17

## 2023-03-26 MED ORDER — ONDANSETRON 4 MG PO TBDP
4.0000 mg | ORAL_TABLET | Freq: Three times a day (TID) | ORAL | 0 refills | Status: DC | PRN
Start: 2023-03-26 — End: 2023-04-17

## 2023-03-26 MED ORDER — FLUCONAZOLE 150 MG PO TABS
150.0000 mg | ORAL_TABLET | ORAL | 0 refills | Status: DC
Start: 2023-03-26 — End: 2023-04-17
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
## 2023-03-26 NOTE — Patient Instructions (Signed)
[facility], thank you for joining Margaretann Loveless, PA-C for today's virtual visit.  While this provider is not your primary care provider (PCP), if your PCP is located in our provider database this encounter information will be shared with them immediately following your visit.   A [facility] MyChart account gives you access to today's visit and all your visits, tests, and labs performed at [facility] " click here if you don't have a  MyChart account or go to [email or web address]  Consent: (Patient) Theresa Walton provided verbal consent for this virtual visit at the beginning of the encounter.  Current Medications:  Current Outpatient Medications:    amoxicillin-clavulanate (AUGMENTIN) 875-125 MG tablet, Take 1 tablet by mouth 2 (two) times daily., Disp: 20 tablet, Rfl: 0   fluconazole (DIFLUCAN) 150 MG tablet, Take 1 tablet (150 mg total) by mouth once a week., Disp: 4 tablet, Rfl: 0   ondansetron (ZOFRAN-ODT) 4 MG disintegrating tablet, Take 1 tablet (4 mg total) by mouth every 8 (eight) hours as needed., Disp: 20 tablet, Rfl: 0   azelastine (ASTELIN) 0.1 % nasal spray, Place 1 spray into both nostrils 2 (two) times daily., Disp: 9 mL, Rfl: 0   cyclobenzaprine (FLEXERIL) 5 MG tablet, TAKE 1 TO 2 TABLETS BY MOUTH AT BEDTIME, Disp: 30 tablet, Rfl: 0   DULoxetine (CYMBALTA) 30 MG capsule, Take by mouth., Disp: , Rfl:    estradiol (VIVELLE-DOT) 0.025 MG/24HR, Place 1 patch onto the skin 2 (two) times a week., Disp: 8 patch, Rfl: 12   eszopiclone (LUNESTA) 1 MG TABS tablet, Take 1 tablet (1 mg total) by mouth at bedtime as needed for sleep. Take immediately before bedtime. Only to be used as needed and do not combine with other sleep aids such as sonata., Disp: 30 tablet, Rfl: 0   levothyroxine (SYNTHROID) 50 MCG tablet, TAKE 1 TABLET(50 MCG) BY MOUTH DAILY, Disp: 90 tablet, Rfl: 3   prazosin (MINIPRESS) 1 MG capsule, Take 1 capsule (1 mg total) by mouth at  bedtime., Disp: 30 capsule, Rfl: 2   rosuvastatin (CRESTOR) 10 MG tablet, Take 10 mg by mouth daily., Disp: , Rfl:    triamcinolone cream (KENALOG) 0.1 %, Apply 1 application topically 2 (two) times daily., Disp: 30 g, Rfl: 0   Trospium Chloride 60 MG CP24, Take 1 capsule (60 mg total) by mouth daily., Disp: 90 capsule, Rfl: 2   valACYclovir (VALTREX) 500 MG tablet, TAKE 1 TABLET BY MOUTH ONCE A DAY, Disp: 90 tablet, Rfl: 3   zaleplon (SONATA) 10 MG capsule, TAKE 1 CAPSULE BY MOUTH AT BEDTIME AS NEEDED FOR SLEEP, Disp: 30 capsule, Rfl: 5   Medications ordered in this encounter:  Meds ordered this encounter  Medications   amoxicillin-clavulanate (AUGMENTIN) 875-125 MG tablet    Sig: Take 1 tablet by mouth 2 (two) times daily.    Dispense:  20 tablet    Refill:  0    Order Specific Question:   Supervising Provider    Answer:   LAMPTEY, PHILIP O [[id number]]   ondansetron (ZOFRAN-ODT) 4 MG disintegrating tablet    Sig: Take 1 tablet (4 mg total) by mouth every 8 (eight) hours as needed.    Dispense:  20 tablet    Refill:  0    Order Specific Question:   Supervising Provider    Answer:   Merrilee Jansky [[id number]]   fluconazole (DIFLUCAN) 150 MG tablet    Sig: Take 1 tablet (150 mg total) by mouth once  a week.    Dispense:  4 tablet    Refill:  0    Order Specific Question:   Supervising Provider    Answer:   Merrilee Jansky [id number]     *If you need refills on other medications prior to your next appointment, please contact your pharmacy*  Follow-Up: Call back or seek an in-person evaluation if the symptoms worsen or if the condition fails to improve as anticipated.  [facility] [phone]    If you have been instructed to have an in-person evaluation today at a local Urgent Care facility, please use the link below. It will take you to a list of all of our available [place] [facility], including address, phone number and hours of operation. Please do not  delay care.  [facility]  If you or a family member do not have a primary care provider, use the link below to schedule a visit and establish care. When you choose a [facility] primary care physician or advanced practice provider, you gain a long-term partner in health. Find a Primary Care Provider  Learn more about [facility]'s in-office and virtual care options: [facility] - Get Care Now

## 2023-03-27 ENCOUNTER — Other Ambulatory Visit: Payer: Self-pay | Admitting: Internal Medicine

## 2023-03-30 ENCOUNTER — Other Ambulatory Visit: Payer: Self-pay | Admitting: Internal Medicine

## 2023-03-30 DIAGNOSIS — F4312 Post-traumatic stress disorder, chronic: Secondary | ICD-10-CM

## 2023-04-01 ENCOUNTER — Other Ambulatory Visit: Payer: Self-pay | Admitting: Obstetrics and Gynecology

## 2023-04-01 DIAGNOSIS — N3281 Overactive bladder: Secondary | ICD-10-CM

## 2023-04-11 ENCOUNTER — Ambulatory Visit: Payer: Managed Care, Other (non HMO) | Admitting: Internal Medicine

## 2023-04-17 ENCOUNTER — Ambulatory Visit: Payer: Managed Care, Other (non HMO) | Admitting: Internal Medicine

## 2023-04-17 ENCOUNTER — Encounter: Payer: Self-pay | Admitting: Internal Medicine

## 2023-04-17 VITALS — BP 96/68 | HR 93 | Ht 62.0 in | Wt 134.4 lb

## 2023-04-17 DIAGNOSIS — F411 Generalized anxiety disorder: Secondary | ICD-10-CM

## 2023-04-17 DIAGNOSIS — E782 Mixed hyperlipidemia: Secondary | ICD-10-CM | POA: Diagnosis not present

## 2023-04-17 DIAGNOSIS — F4310 Post-traumatic stress disorder, unspecified: Secondary | ICD-10-CM

## 2023-04-17 DIAGNOSIS — K588 Other irritable bowel syndrome: Secondary | ICD-10-CM | POA: Diagnosis not present

## 2023-04-17 DIAGNOSIS — E039 Hypothyroidism, unspecified: Secondary | ICD-10-CM

## 2023-04-17 NOTE — Progress Notes (Signed)
Established Patient Office Visit  Subjective:  Patient ID: Theresa Walton, female    DOB: 09/10/1966  Age: 57 y.o. MRN: [id number]  Chief Complaint  Patient presents with   Follow-up    3 month follow up    Patient in office for regular 3 month follow up. Diarrhea resolved. Joint pain improving with PT. Mammogram due in October 2024. Sleep study scheduled 04/2023.  Patient is taking prazosin for her PTSD and thinks it is helping her well.    No other concerns at this time.   Past Medical History:  Diagnosis Date   Basal cell carcinoma (BCC) of jawline 06/26/2018   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HCC)    Genital herpes    Hypothyroidism    Seasonal allergies    Syncope and collapse    Thyroid disease    hypothyroidism    Past Surgical History:  Procedure Laterality Date   ABDOMINAL HYSTERECTOMY     ANTERIOR CRUCIATE LIGAMENT REPAIR Right 2012   ARTHROSCOPIC REPAIR ACL  2013   Right knee   BLADDER REPAIR     BREAST ENHANCEMENT SURGERY  1995   BREAST IMPLANT REMOVAL Bilateral 12/12/2021   COLONOSCOPY WITH PROPOFOL N/A 08/29/2017   Procedure: COLONOSCOPY WITH PROPOFOL;  Surgeon: Wyline Mood, MD;  Location[facility] END[facility]COPY;  Service: Gastroenterology;  Laterality: N/A;   CYSTOCELE REPAIR  2013   ENDOMETRIAL ABLATION  2013   NASAL SEPTUM SURGERY  1987, 1991, 2001   PARTIAL HYSTERECTOMY  2015   PARTIAL HYSTERECTOMY Bilateral 2014   RECTOPEXY  2011   TONSILECTOMY, ADENOIDECTOMY, BILATERAL MYRINGOTOMY AND TUBES  1984   TOOTH EXTRACTION      Social History   Socioeconomic History   Marital status: Divorced    Spouse name: Not on file   Number of children: Not on file   Years of education: Not on file   Highest education level: Not on file  Occupational History   Not on file  Tobacco Use   Smoking status: Former    Types: Cigarettes    Quit date: 05/15/2015    Years since quitting: 7.9   Smokeless tobacco: Never  Vaping Use   Vaping Use: Never used  Substance and  Sexual Activity   Alcohol use: No    Alcohol/week: 0.0 standard drinks of alcohol   Drug use: No   Sexual activity: Not Currently    Partners: Male    Birth control/protection: Surgical  Other Topics Concern   Not on file  Social History Narrative   Not on file   Social Determinants of Health   Financial Resource Strain: Not on file  Food Insecurity: Not on file  Transportation Needs: Not on file  Physical Activity: Not on file  Stress: Not on file  Social Connections: Not on file  Intimate Partner Violence: Not on file    Family History  Problem Relation Age of Onset   Pulmonary fibrosis Mother    Healthy Sister    Healthy Brother    Healthy Sister    Lung cancer Paternal Grandmother    Arrhythmia Father    Heart disease Father    Prostate cancer Neg Hx    Bladder Cancer Neg Hx    Kidney cancer Neg Hx    Breast cancer Neg Hx     Allergies  Allergen Reactions   Cefaclor Anaphylaxis   Cephalosporins Anaphylaxis   Lubiprostone     Other reaction(s): Angioedema Tongue Swelling x 2 times.  Other reaction(s): Angioedema Tongue Swelling  x 2 times.    Celecoxib    Milk (Cow)     Other reaction(s): Other (See Comments) GI Upset to milk protein   Apple Juice Nausea And Vomiting   Pseudoephedrine Palpitations    Tachycardia    Review of Systems  Constitutional: Negative.   HENT: Negative.    Eyes: Negative.   Respiratory: Negative.  Negative for cough and shortness of breath.   Cardiovascular: Negative.  Negative for chest pain and palpitations.  Gastrointestinal: Negative.  Negative for abdominal pain, constipation, diarrhea, heartburn, nausea and vomiting.  Genitourinary: Negative.  Negative for dysuria.  Musculoskeletal:  Positive for joint pain.  Skin: Negative.   Neurological: Negative.  Negative for dizziness and headaches.  Endo/Heme/Allergies: Negative.   Psychiatric/Behavioral: Negative.         Objective:   BP 96/68   Pulse 93   Ht 5\' 2"   (1.575 m)   Wt 134 lb 6.4 oz (61 kg)   SpO2 98%   BMI 24.58 kg/m   Vitals:   04/17/23 1329  BP: 96/68  Pulse: 93  Height: 5\' 2"  (1.575 m)  Weight: 134 lb 6.4 oz (61 kg)  SpO2: 98%  BMI (Calculated): 24.58    Physical Exam Vitals and nursing note reviewed.  Constitutional:      Appearance: Normal appearance.  HENT:     Head: Normocephalic and atraumatic.     Nose: Nose normal.  Cardiovascular:     Rate and Rhythm: Normal rate and regular rhythm.     Pulses: Normal pulses.     Heart sounds: Normal heart sounds. No murmur heard. Pulmonary:     Effort: Pulmonary effort is normal.     Breath sounds: Normal breath sounds. No wheezing or rales.  Abdominal:     General: Bowel sounds are normal. There is distension.     Palpations: Abdomen is soft. There is no mass.     Tenderness: There is no right CVA tenderness, left CVA tenderness or guarding.  Musculoskeletal:        General: No swelling, tenderness or deformity. Normal range of motion.     Cervical back: Normal range of motion.     Right lower leg: No edema.     Left lower leg: No edema.  Skin:    General: Skin is warm and dry.  Neurological:     General: No focal deficit present.     Mental Status: She is alert and oriented to person, place, and time.  Psychiatric:        Mood and Affect: Mood normal.        Behavior: Behavior normal.      No results found for any visits on 04/17/23.  No results found for this or any previous visit (from the past 2160 hour(s)).    Assessment & Plan:  Patient advised to continue taking all her medications as such.  Will check labs before next visit. Problem List Items Addressed This Visit     Adult hypothyroidism   Mixed hyperlipidemia - Primary   Post traumatic stress disorder (PTSD)   GAD (generalized anxiety disorder)   Other irritable bowel syndrome    Return in about 4 months (around 08/17/2023) for blood work prior.   Total time spent: 30 minutes  Margaretann Loveless, MD  04/17/2023   This document may have been prepared by [facility] Voice Recognition software and as such may include unintentional dictation errors.

## 2023-04-26 ENCOUNTER — Telehealth: Payer: Managed Care, Other (non HMO) | Admitting: Physician Assistant

## 2023-04-26 DIAGNOSIS — T63484A Toxic effect of venom of other arthropod, undetermined, initial encounter: Secondary | ICD-10-CM | POA: Diagnosis not present

## 2023-04-26 DIAGNOSIS — R21 Rash and other nonspecific skin eruption: Secondary | ICD-10-CM | POA: Diagnosis not present

## 2023-04-26 MED ORDER — PREDNISONE 20 MG PO TABS
40.0000 mg | ORAL_TABLET | Freq: Every day | ORAL | 0 refills | Status: AC
Start: 2023-04-26 — End: 2023-05-01

## 2023-04-26 NOTE — Progress Notes (Signed)
Virtual Visit Consent   [name], you are scheduled for a virtual visit with a  provider today. Just as with appointments in the office, your consent must be obtained to participate. Your consent will be active for this visit and any virtual visit you may have with one of our providers in the next 365 days. If you have a MyChart account, a copy of this consent can be sent to you electronically.  As this is a virtual visit, video technology does not allow for your provider to perform a traditional examination. This may limit your provider's ability to fully assess your condition. If your provider identifies any concerns that need to be evaluated in person or the need to arrange testing (such as labs, EKG, etc.), we will make arrangements to do so. Although advances in technology are sophisticated, we cannot ensure that it will always work on either your end or our end. If the connection with a video visit is poor, the visit may have to be switched to a telephone visit. With either a video or telephone visit, we are not always able to ensure that we have a secure connection.  By engaging in this virtual visit, you consent to the provision of healthcare and authorize for your insurance to be billed (if applicable) for the services provided during this visit. Depending on your insurance coverage, you may receive a charge related to this service.  I need to obtain your verbal consent now. Are you willing to proceed with your visit today? [name] has provided verbal consent on 04/26/2023 for a virtual visit (video or telephone). [name], PA-C  Date: 04/26/2023 5:07 PM  Virtual Visit via Video Note   I, [name], connected with  Theresa Walton  ([id number], 03-13-1966) on 04/26/23 at  5:00 PM EDT by a video-enabled telemedicine application and verified that I am speaking with the correct person using two identifiers.  Location: Patient: Virtual Visit Location Patient:  Home Provider: Virtual Visit Location Provider: Home   I discussed the limitations of evaluation and management by telemedicine and the availability of in person appointments. The patient expressed understanding and agreed to proceed.    History of Present Illness: Theresa Walton is a 57 y.o. who identifies as a female who was assigned female at birth, and is being seen today for a sting to the back of her right arm that occurred yesterday.  She reports since yesterday she has experienced increased redness and stinging.  Reports minimal itching.  States the stinging woke her up from sleep several times.  She has applied hydrocortisone cream and taken benadryl with temporary relief.  HPI: HPI  Problems:  Patient Active Problem List   Diagnosis Date Noted   GAD (generalized anxiety disorder) 12/30/2022   Other irritable bowel syndrome 12/30/2022   Diarrhea 12/30/2022   Weight loss, unintentional 12/30/2022   Chronic post-traumatic stress disorder (PTSD) 12/03/2022   Mixed hyperlipidemia 12/03/2022   Post traumatic stress disorder (PTSD) 12/03/2022   Fatigue 03/01/2022   Situational anxiety 06/29/2021   Need for shingles vaccine 06/29/2021   Chronic fatigue 06/29/2021   Situational stress 06/29/2021   Pain in joint, lower leg 06/28/2021   Fatigue after COVID-19 vaccination 06/13/2021   Edema 08/05/2018   Impingement syndrome of shoulder region 08/05/2018   Knee joint effusion 08/05/2018   Old anterior cruciate ligament disruption 08/05/2018   Patellar tendonitis 08/05/2018   Basal cell carcinoma (BCC) of jawline 06/26/2018   History of pubovaginal  sling 04/01/2017   Essential tremor 05/15/2016   Allergic rhinitis 06/22/2015   B12 deficiency 06/21/2015   H/O: hysterectomy 06/21/2015   Hypertriglyceridemia 06/21/2015   Hypoglycemia 06/21/2015   Menopausal and perimenopausal disorder 06/21/2015   Vasovagal symptom 06/21/2015   Insomnia 04/19/2015   Female stress incontinence  03/24/2012   Adult hypothyroidism 08/28/2009   Vitamin D insufficiency 08/28/2009   Chronic infection of sinus 02/01/2009   Adaptation reaction 09/09/2008   Genital herpes simplex type 1 infection 07/13/2008    Allergies:  Allergies  Allergen Reactions   Cefaclor Anaphylaxis   Cephalosporins Anaphylaxis   Lubiprostone     Other reaction(s): Angioedema Tongue Swelling x 2 times.  Other reaction(s): Angioedema Tongue Swelling x 2 times.    Celecoxib    Milk (Cow)     Other reaction(s): Other (See Comments) GI Upset to milk protein   Apple Juice Nausea And Vomiting   Pseudoephedrine Palpitations    Tachycardia   Medications:  Current Outpatient Medications:    predniSONE (DELTASONE) 20 MG tablet, Take 2 tablets (40 mg total) by mouth daily with breakfast for 5 days., Disp: 10 tablet, Rfl: 0   cyclobenzaprine (FLEXERIL) 5 MG tablet, TAKE 1 TO 2 TABLETS BY MOUTH AT BEDTIME, Disp: 30 tablet, Rfl: 0   estradiol (VIVELLE-DOT) 0.025 MG/24HR, Place 1 patch onto the skin 2 (two) times a week., Disp: 8 patch, Rfl: 12   eszopiclone (LUNESTA) 1 MG TABS tablet, Take 1 tablet (1 mg total) by mouth at bedtime as needed for sleep. Take immediately before bedtime. Only to be used as needed and do not combine with other sleep aids such as sonata., Disp: 30 tablet, Rfl: 0   levothyroxine (SYNTHROID) 50 MCG tablet, TAKE 1 TABLET(50 MCG) BY MOUTH DAILY, Disp: 90 tablet, Rfl: 3   prazosin (MINIPRESS) 1 MG capsule, TAKE 1 CAPSULE(1 MG) BY MOUTH AT BEDTIME, Disp: 90 capsule, Rfl: 2   rosuvastatin (CRESTOR) 10 MG tablet, TAKE 1 TABLET BY MOUTH EVERY DAY, Disp: 90 tablet, Rfl: 3   Trospium Chloride 60 MG CP24, TAKE 1 CAPSULE(60 MG) BY MOUTH DAILY, Disp: 90 capsule, Rfl: 2   valACYclovir (VALTREX) 500 MG tablet, TAKE 1 TABLET BY MOUTH ONCE A DAY, Disp: 90 tablet, Rfl: 3   zaleplon (SONATA) 10 MG capsule, TAKE 1 CAPSULE BY MOUTH AT BEDTIME AS NEEDED FOR SLEEP, Disp: 30 capsule, Rfl:  5  Observations/Objective: Patient is well-developed, well-nourished in no acute distress.  Resting comfortably at home.  Head is normocephalic, atraumatic.  No labored breathing.  Speech is clear and coherent with logical content.  Patient is alert and oriented at baseline.    Assessment and Plan: 1. Rash - predniSONE (DELTASONE) 20 MG tablet; Take 2 tablets (40 mg total) by mouth daily with breakfast for 5 days.  Dispense: 10 tablet; Refill: 0  2. Insect stings, undetermined intent, initial encounter  Supportive care discussed.  No signs of infection.   Follow Up Instructions: I discussed the assessment and treatment plan with the patient. The patient was provided an opportunity to ask questions and all were answered. The patient agreed with the plan and demonstrated an understanding of the instructions.  A copy of instructions were sent to the patient via MyChart unless otherwise noted below.     The patient was advised to call back or seek an in-person evaluation if the symptoms worsen or if the condition fails to improve as anticipated.  Time:  I spent 7 minutes with the patient via telehealth  technology discussing the above problems/concerns.    Lenise Arena Ward, PA-C

## 2023-04-26 NOTE — Patient Instructions (Signed)
[facility], thank you for joining [name], PA-C for today's virtual visit.  While this provider is not your primary care provider (PCP), if your PCP is located in our provider database this encounter information will be shared with them immediately following your visit.   A [facility] MyChart account gives you access to today's visit and all your visits, tests, and labs performed at [facility] " click here if you don't have a [facility] MyChart account or go to [email or web address]  Consent: (Patient) Theresa Walton provided verbal consent for this virtual visit at the beginning of the encounter.  Current Medications:  Current Outpatient Medications:    predniSONE (DELTASONE) 20 MG tablet, Take 2 tablets (40 mg total) by mouth daily with breakfast for 5 days., Disp: 10 tablet, Rfl: 0   cyclobenzaprine (FLEXERIL) 5 MG tablet, TAKE 1 TO 2 TABLETS BY MOUTH AT BEDTIME, Disp: 30 tablet, Rfl: 0   estradiol (VIVELLE-DOT) 0.025 MG/24HR, Place 1 patch onto the skin 2 (two) times a week., Disp: 8 patch, Rfl: 12   eszopiclone (LUNESTA) 1 MG TABS tablet, Take 1 tablet (1 mg total) by mouth at bedtime as needed for sleep. Take immediately before bedtime. Only to be used as needed and do not combine with other sleep aids such as sonata., Disp: 30 tablet, Rfl: 0   levothyroxine (SYNTHROID) 50 MCG tablet, TAKE 1 TABLET(50 MCG) BY MOUTH DAILY, Disp: 90 tablet, Rfl: 3   prazosin (MINIPRESS) 1 MG capsule, TAKE 1 CAPSULE(1 MG) BY MOUTH AT BEDTIME, Disp: 90 capsule, Rfl: 2   rosuvastatin (CRESTOR) 10 MG tablet, TAKE 1 TABLET BY MOUTH EVERY DAY, Disp: 90 tablet, Rfl: 3   Trospium Chloride 60 MG CP24, TAKE 1 CAPSULE(60 MG) BY MOUTH DAILY, Disp: 90 capsule, Rfl: 2   valACYclovir (VALTREX) 500 MG tablet, TAKE 1 TABLET BY MOUTH ONCE A DAY, Disp: 90 tablet, Rfl: 3   zaleplon (SONATA) 10 MG capsule, TAKE 1 CAPSULE BY MOUTH AT BEDTIME AS NEEDED FOR SLEEP, Disp: 30 capsule, Rfl: 5    Medications ordered in this encounter:  Meds ordered this encounter  Medications   predniSONE (DELTASONE) 20 MG tablet    Sig: Take 2 tablets (40 mg total) by mouth daily with breakfast for 5 days.    Dispense:  10 tablet    Refill:  0    Order Specific Question:   Supervising Provider    Answer:   Merrilee Jansky [id number]     *If you need refills on other medications prior to your next appointment, please contact your pharmacy*  Follow-Up: Call back or seek an in-person evaluation if the symptoms worsen or if the condition fails to improve as anticipated.  [facility] [phone]  Other Instructions Take prednisone as prescribed. Apply ice as needed.  Continue with topical hydrocortisone and can take benadryl as needed for itching.    If you have been instructed to have an in-person evaluation today at a local Urgent Care facility, please use the link below. It will take you to a list of all of our available [facility], including address, phone number and hours of operation. Please do not delay care.  [facility]  If you or a family member do not have a primary care provider, use the link below to schedule a visit and establish care. When you choose a [facility] primary care physician or advanced practice provider, you gain a long-term partner in health. Find  a Primary Care Provider  Learn more about [facility]'s in-office and virtual care options: [place] - Get Care Now

## 2023-05-05 ENCOUNTER — Other Ambulatory Visit: Payer: Self-pay | Admitting: Internal Medicine

## 2023-05-05 DIAGNOSIS — Z9109 Other allergy status, other than to drugs and biological substances: Secondary | ICD-10-CM

## 2023-05-21 ENCOUNTER — Other Ambulatory Visit: Payer: Self-pay | Admitting: Physician Assistant

## 2023-05-21 ENCOUNTER — Other Ambulatory Visit: Payer: Self-pay | Admitting: Family Medicine

## 2023-05-21 DIAGNOSIS — E039 Hypothyroidism, unspecified: Secondary | ICD-10-CM

## 2023-05-21 DIAGNOSIS — G4700 Insomnia, unspecified: Secondary | ICD-10-CM

## 2023-05-23 ENCOUNTER — Encounter: Payer: Self-pay | Admitting: Internal Medicine

## 2023-05-26 ENCOUNTER — Other Ambulatory Visit: Payer: Self-pay

## 2023-05-26 ENCOUNTER — Other Ambulatory Visit: Payer: Self-pay | Admitting: Cardiology

## 2023-05-26 ENCOUNTER — Telehealth: Payer: Self-pay | Admitting: Physician Assistant

## 2023-05-26 ENCOUNTER — Other Ambulatory Visit: Payer: Self-pay | Admitting: Internal Medicine

## 2023-05-26 ENCOUNTER — Other Ambulatory Visit: Payer: Self-pay | Admitting: Physician Assistant

## 2023-05-26 DIAGNOSIS — G4700 Insomnia, unspecified: Secondary | ICD-10-CM

## 2023-05-26 DIAGNOSIS — E039 Hypothyroidism, unspecified: Secondary | ICD-10-CM

## 2023-05-26 MED ORDER — LEVOTHYROXINE SODIUM 50 MCG PO TABS
50.0000 ug | ORAL_TABLET | Freq: Every day | ORAL | 3 refills | Status: DC
Start: 2023-05-26 — End: 2023-05-26

## 2023-05-26 MED ORDER — ESZOPICLONE 1 MG PO TABS
1.0000 mg | ORAL_TABLET | Freq: Every evening | ORAL | 0 refills | Status: AC | PRN
Start: 2023-05-26 — End: ?

## 2023-05-26 MED ORDER — LEVOTHYROXINE SODIUM 50 MCG PO TABS: 50.0000 ug | ORAL_TABLET | Freq: Every day | ORAL | 3 refills | Status: DC

## 2023-05-29 NOTE — Telephone Encounter (Signed)
Done

## 2023-06-03 ENCOUNTER — Ambulatory Visit: Payer: Managed Care, Other (non HMO) | Admitting: Obstetrics and Gynecology

## 2023-06-17 ENCOUNTER — Ambulatory Visit: Payer: Managed Care, Other (non HMO) | Admitting: Obstetrics and Gynecology

## 2023-06-17 ENCOUNTER — Encounter: Payer: Self-pay | Admitting: Obstetrics and Gynecology

## 2023-06-17 DIAGNOSIS — N3281 Overactive bladder: Secondary | ICD-10-CM | POA: Diagnosis not present

## 2023-06-17 NOTE — Progress Notes (Signed)
[facility] Return Visit  SUBJECTIVE  History of Present Illness: Theresa Walton is a 57 y.o. female seen in follow-up for OAB. Has been on trospium 60mg  ER daily.,   Up until a few months ago, medication has been working well until they changed it to the white capsule instead of the orange and white capsule. Now the urgency has returned. Has not had leakage.   Drinking water, hot or iced tea, maybe 1 cup coffee.   No longer using the vaginal estrogen cream. She uses an estradiol patch.   Past Medical History: Patient  has a past medical history of Basal cell carcinoma (BCC) of jawline (06/26/2018), 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HCC), Genital herpes, Hypothyroidism, Seasonal allergies, Syncope and collapse, and Thyroid disease.   Past Surgical History: She  has a past surgical history that includes Tonsilectomy, adenoidectomy, bilateral myringotomy and tubes (1984); Nasal septum surgery (1987, 1991, 2001); Breast enhancement surgery (1995); Rectopexy (2011); Cystocele repair (2013); Endometrial ablation (2013); Arthroscopic repair ACL (2013); Partial hysterectomy (2015); Abdominal hysterectomy; Partial hysterectomy (Bilateral, 2014); Anterior cruciate ligament repair (Right, 2012); Bladder repair; Colonoscopy with propofol (N/A, 08/29/2017); Tooth extraction; and Breast implant removal (Bilateral, 12/12/2021).   Medications: She has a current medication list which includes the following prescription(s): cyclobenzaprine, estradiol, eszopiclone, levothyroxine, prazosin, rosuvastatin, trospium chloride, and valacyclovir.   Allergies: Patient is allergic to cefaclor, cephalosporins, lubiprostone, celecoxib, milk (cow), apple juice, and pseudoephedrine.   Social History: Patient  reports that she quit smoking about 8 years ago. Her smoking use included cigarettes. She has never used smokeless tobacco. She reports that she does not drink alcohol and does not use drugs.       OBJECTIVE     Physical Exam: Vitals:   06/17/23 1411  BP: 106/65  Pulse: 77     Gen: No apparent distress, A&O x 3.  Detailed Urogynecologic Evaluation:  deferred   ASSESSMENT AND PLAN    Ms. Sedberry is a 57 y.o. with:  1. Overactive bladder     - Trospium switched to generic. Pharmacy called and prescription given for [place] (no generic). She will use that and if symptoms improve again can follow up in a year.  - Otherwise she will return sooner and we can review further options for treatment of her urgency.  - Discussed avoiding bladder irritants such as tea and caffeine  Return 1 year or sooner if change in medication does not help symptoms  Marguerita Beards, MD

## 2023-06-26 ENCOUNTER — Other Ambulatory Visit: Payer: Self-pay | Admitting: Family Medicine

## 2023-06-26 DIAGNOSIS — Z1231 Encounter for screening mammogram for malignant neoplasm of breast: Secondary | ICD-10-CM

## 2023-07-01 ENCOUNTER — Telehealth: Payer: Managed Care, Other (non HMO) | Admitting: Physician Assistant

## 2023-07-01 DIAGNOSIS — B9689 Other specified bacterial agents as the cause of diseases classified elsewhere: Secondary | ICD-10-CM | POA: Diagnosis not present

## 2023-07-01 DIAGNOSIS — J019 Acute sinusitis, unspecified: Secondary | ICD-10-CM

## 2023-07-01 MED ORDER — PREDNISONE 10 MG (21) PO TBPK: ORAL_TABLET | ORAL | 0 refills | Status: DC

## 2023-07-01 MED ORDER — DOXYCYCLINE HYCLATE 100 MG PO TABS
100.0000 mg | ORAL_TABLET | Freq: Two times a day (BID) | ORAL | 0 refills | Status: AC
Start: 2023-07-01 — End: ?

## 2023-07-01 NOTE — Addendum Note (Signed)
Addended by: Waldon Merl on: 07/01/2023 04:56 PM   Modules accepted: Orders

## 2023-07-01 NOTE — Progress Notes (Signed)
I have spent 5 minutes in review of e-visit questionnaire, review and updating patient chart, medical decision making and response to patient.   William Cody Martin, PA-C    

## 2023-07-01 NOTE — Progress Notes (Signed)

## 2023-07-08 ENCOUNTER — Ambulatory Visit: Payer: Managed Care, Other (non HMO) | Admitting: Internal Medicine

## 2023-07-08 ENCOUNTER — Telehealth: Payer: Managed Care, Other (non HMO) | Admitting: Family Medicine

## 2023-07-08 ENCOUNTER — Encounter: Payer: Self-pay | Admitting: Internal Medicine

## 2023-07-08 VITALS — BP 98/72 | HR 82 | Temp 97.4°F | Ht 62.0 in | Wt 135.4 lb

## 2023-07-08 DIAGNOSIS — R059 Cough, unspecified: Secondary | ICD-10-CM

## 2023-07-08 DIAGNOSIS — J3081 Allergic rhinitis due to animal (cat) (dog) hair and dander: Secondary | ICD-10-CM

## 2023-07-08 DIAGNOSIS — J329 Chronic sinusitis, unspecified: Secondary | ICD-10-CM

## 2023-07-08 DIAGNOSIS — F4310 Post-traumatic stress disorder, unspecified: Secondary | ICD-10-CM | POA: Diagnosis not present

## 2023-07-08 DIAGNOSIS — F411 Generalized anxiety disorder: Secondary | ICD-10-CM | POA: Diagnosis not present

## 2023-07-08 DIAGNOSIS — B9689 Other specified bacterial agents as the cause of diseases classified elsewhere: Secondary | ICD-10-CM

## 2023-07-08 DIAGNOSIS — E039 Hypothyroidism, unspecified: Secondary | ICD-10-CM

## 2023-07-08 NOTE — Progress Notes (Signed)
Established Patient Office Visit  Subjective:  Patient ID: Theresa Walton, female    DOB: Feb 15, 1966  Age: 57 y.o. MRN: [id number]  Chief Complaint  Patient presents with   Sinus Problem    Patient comes in with complaints of sinus pressure and congestion.  She has history of recurrent sinus infections and has been under the care of ENT.  She recently had a CT of the sinuses which was unremarkable.  However last week she developed sinus infection and received antibiotics and prednisone Dosepak via telehealth.  Today patient reports of ringing in her ears as well as pressure over her right maxillary sinus.  She does not have any further nasal discharge or postnasal drip, no more fever or chills but the pressure and congestion in the sinus is bothersome.  She also got worried that the ringing could be coming from doxycycline which she has taken for almost a week now. On exam both her tympanic membranes are clear bilaterally. Patient advised to continue her Mucinex, steroid nasal sprays and decongestants along with steaming. She can stop the doxycycline she is since she has taken it for 7 days.  She will be making an appointment to see her ENT.    No other concerns at this time.   Past Medical History:  Diagnosis Date   Basal cell carcinoma (BCC) of jawline 06/26/2018   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HCC)    Genital herpes    Hypothyroidism    Seasonal allergies    Syncope and collapse    Thyroid disease    hypothyroidism    Past Surgical History:  Procedure Laterality Date   ABDOMINAL HYSTERECTOMY     ANTERIOR CRUCIATE LIGAMENT REPAIR Right 2012   ARTHROSCOPIC REPAIR ACL  2013   Right knee   BLADDER REPAIR     BREAST ENHANCEMENT SURGERY  1995   BREAST IMPLANT REMOVAL Bilateral 12/12/2021   COLONOSCOPY WITH PROPOFOL N/A 08/29/2017   Procedure: COLONOSCOPY WITH PROPOFOL;  Surgeon: Wyline Mood, MD;  Location: [facility] ENDOSCOPY;  Service: Gastroenterology;  Laterality: N/A;    CYSTOCELE REPAIR  2013   ENDOMETRIAL ABLATION  2013   NASAL SEPTUM SURGERY  1987, 1991, 2001   PARTIAL HYSTERECTOMY  2015   PARTIAL HYSTERECTOMY Bilateral 2014   RECTOPEXY  2011   TONSILECTOMY, ADENOIDECTOMY, BILATERAL MYRINGOTOMY AND TUBES  1984   TOOTH EXTRACTION      Social History   Socioeconomic History   Marital status: Divorced    Spouse name: Not on file   Number of children: Not on file   Years of education: Not on file   Highest education level: Not on file  Occupational History   Not on file  Tobacco Use   Smoking status: Former    Current packs/day: 0.00    Types: Cigarettes    Quit date: 05/15/2015    Years since quitting: 8.1   Smokeless tobacco: Never  Vaping Use   Vaping status: Never Used  Substance and Sexual Activity   Alcohol use: No    Alcohol/week: 0.0 standard drinks of alcohol   Drug use: No   Sexual activity: Not Currently    Partners: Male    Birth control/protection: Surgical  Other Topics Concern   Not on file  Social History Narrative   Not on file   Social Determinants of Health   Financial Resource Strain: Not on file  Food Insecurity: Not on file  Transportation Needs: Not on file  Physical Activity: Not on file  Stress:  Not on file  Social Connections: Not on file  Intimate Partner Violence: Not on file    Family History  Problem Relation Age of Onset   Pulmonary fibrosis Mother    Healthy Sister    Healthy Brother    Healthy Sister    Lung cancer Paternal Grandmother    Arrhythmia Father    Heart disease Father    Prostate cancer Neg Hx    Bladder Cancer Neg Hx    Kidney cancer Neg Hx    Breast cancer Neg Hx     Allergies  Allergen Reactions   Cefaclor Anaphylaxis   Cephalosporins Anaphylaxis   Lubiprostone     Other reaction(s): Angioedema Tongue Swelling x 2 times.  Other reaction(s): Angioedema Tongue Swelling x 2 times.    Celecoxib    Milk (Cow)     Other reaction(s): Other (See Comments) GI Upset to  milk protein   Apple Juice Nausea And Vomiting   Pseudoephedrine Palpitations    Tachycardia    Review of Systems  Constitutional: Negative.  Negative for chills, diaphoresis, fever, malaise/fatigue and weight loss.  HENT:  Positive for congestion and tinnitus. Negative for ear discharge, ear pain, hearing loss, nosebleeds, sinus pain and sore throat.   Eyes: Negative.   Respiratory: Negative.  Negative for cough, shortness of breath and stridor.   Cardiovascular: Negative.  Negative for chest pain, palpitations and leg swelling.  Gastrointestinal: Negative.  Negative for abdominal pain, constipation, diarrhea, heartburn, nausea and vomiting.  Genitourinary: Negative.  Negative for dysuria and flank pain.  Musculoskeletal: Negative.  Negative for joint pain and myalgias.  Skin: Negative.   Neurological: Negative.  Negative for dizziness and headaches.  Endo/Heme/Allergies: Negative.   Psychiatric/Behavioral: Negative.  Negative for depression and suicidal ideas. The patient is not nervous/anxious.        Objective:   BP 98/72   Pulse 82   Temp (!) 97.4 F (36.3 C) (Tympanic)   Ht 5\' 2"  (1.575 m)   Wt 135 lb 6.4 oz (61.4 kg)   SpO2 98%   BMI 24.76 kg/m   Vitals:   07/08/23 1403  BP: 98/72  Pulse: 82  Temp: (!) 97.4 F (36.3 C)  Height: 5\' 2"  (1.575 m)  Weight: 135 lb 6.4 oz (61.4 kg)  SpO2: 98%  TempSrc: Tympanic  BMI (Calculated): 24.76    Physical Exam Vitals and nursing note reviewed.  Constitutional:      Appearance: Normal appearance.  HENT:     Head: Normocephalic and atraumatic.     Right Ear: Tympanic membrane and external ear normal. No middle ear effusion. There is no impacted cerumen. Tympanic membrane is not injected, erythematous or bulging.     Left Ear: Tympanic membrane, ear canal and external ear normal.  No middle ear effusion. There is no impacted cerumen. Tympanic membrane is not injected, erythematous or bulging.     Nose: Nose normal.      Mouth/Throat:     Mouth: Mucous membranes are moist.     Pharynx: Oropharynx is clear.  Eyes:     Conjunctiva/sclera: Conjunctivae normal.     Pupils: Pupils are equal, round, and reactive to light.  Cardiovascular:     Rate and Rhythm: Normal rate and regular rhythm.     Pulses: Normal pulses.     Heart sounds: Normal heart sounds. No murmur heard. Pulmonary:     Effort: Pulmonary effort is normal.     Breath sounds: Normal breath sounds. No wheezing.  Abdominal:     General: Bowel sounds are normal.     Palpations: Abdomen is soft.     Tenderness: There is no abdominal tenderness. There is no right CVA tenderness or left CVA tenderness.  Musculoskeletal:        General: Normal range of motion.     Cervical back: Normal range of motion.     Right lower leg: No edema.     Left lower leg: No edema.  Skin:    General: Skin is warm and dry.  Neurological:     General: No focal deficit present.     Mental Status: She is alert and oriented to person, place, and time.  Psychiatric:        Mood and Affect: Mood normal.        Behavior: Behavior normal.      No results found for any visits on 07/08/23.  No results found for this or any previous visit (from the past 2160 hour(s)).    Assessment & Plan:  Patient advised to continue her current regimen. Will return as scheduled for her lab work. Problem List Items Addressed This Visit     Adult hypothyroidism   Allergic rhinitis   Post traumatic stress disorder (PTSD)   GAD (generalized anxiety disorder)   Other Visit Diagnoses     Recurrent sinus infections    -  Primary       Follow up as scheduled.  Total time spent: 30 minutes  Margaretann Loveless, MD  07/08/2023   This document may have been prepared by [facility] Voice Recognition software and as such may include unintentional dictation errors.

## 2023-07-08 NOTE — Progress Notes (Signed)
Theresa Walton   Needs in person given first line treatment failure. Will follow up with PCP or UC today.  Patient acknowledged agreement and understanding of the plan.

## 2023-07-27 ENCOUNTER — Other Ambulatory Visit: Payer: Self-pay | Admitting: Physician Assistant

## 2023-07-27 DIAGNOSIS — G4700 Insomnia, unspecified: Secondary | ICD-10-CM

## 2023-07-28 NOTE — Telephone Encounter (Signed)
Next11/24

## 2023-07-30 ENCOUNTER — Encounter: Payer: Self-pay | Admitting: Family Medicine

## 2023-07-30 ENCOUNTER — Ambulatory Visit: Payer: Managed Care, Other (non HMO) | Admitting: Family Medicine

## 2023-07-30 VITALS — BP 110/74 | HR 87 | Ht 62.0 in | Wt 134.0 lb

## 2023-07-30 DIAGNOSIS — Z1339 Encounter for screening examination for other mental health and behavioral disorders: Secondary | ICD-10-CM | POA: Diagnosis not present

## 2023-07-30 DIAGNOSIS — Z23 Encounter for immunization: Secondary | ICD-10-CM

## 2023-07-30 DIAGNOSIS — Z01419 Encounter for gynecological examination (general) (routine) without abnormal findings: Secondary | ICD-10-CM | POA: Diagnosis not present

## 2023-07-30 DIAGNOSIS — E039 Hypothyroidism, unspecified: Secondary | ICD-10-CM | POA: Diagnosis not present

## 2023-07-30 DIAGNOSIS — N959 Unspecified menopausal and perimenopausal disorder: Secondary | ICD-10-CM | POA: Diagnosis not present

## 2023-07-30 DIAGNOSIS — Z113 Encounter for screening for infections with a predominantly sexual mode of transmission: Secondary | ICD-10-CM

## 2023-07-30 MED ORDER — ESTRADIOL 0.025 MG/24HR TD PTTW
1.0000 | MEDICATED_PATCH | TRANSDERMAL | 12 refills | Status: DC
Start: 2023-07-31 — End: 2024-02-16

## 2023-07-30 NOTE — Assessment & Plan Note (Addendum)
40981 - On HRT - refills x 1 year given. ? Adding to breast tenderness. ? Need to increase if hot flashes and night sweats worsen.

## 2023-07-30 NOTE — Progress Notes (Signed)
Patient presents for Annual.  Pt would like Thyroid labs drawn pt states she needs indivudal components instead on Panel   Abdominal Hyst still has ovaries  Mammogram:  08/09/2022 pt has Mammogram already scheduled  STD Screening:  Accepts  Flu Vaccine : Accepts  CC: Pt states she is always hot all the time. Notes using ice packs to help cool down.   Fun Fact: pt recently went on cruise with family and was able to dance with 59 yr old dad.

## 2023-07-30 NOTE — Progress Notes (Signed)
Subjective:     Theresa Walton is a 57 y.o. female and is here for a comprehensive physical exam. The patient reports problems - having hot flashes while eating any hot dishes. She continues on low dose HRT, due to on-going night sweats. Wants her TSH checked. Also reports breast tenderness. She is s/p TAH.    The following portions of the patient's history were reviewed and updated as appropriate: allergies, current medications, past family history, past medical history, past social history, past surgical history, and problem list.  Review of Systems Pertinent items noted in HPI and remainder of comprehensive ROS otherwise negative.   Objective:  Chaperone present for exam   BP 110/74   Pulse 87   Ht 5\' 2"  (1.575 m)   Wt 134 lb (60.8 kg)   BMI 24.51 kg/m  General appearance: alert, cooperative, and appears stated age Head: Normocephalic, without obvious abnormality, atraumatic Neck: no adenopathy, supple, symmetrical, trachea midline, and thyroid not enlarged, symmetric, no tenderness/mass/nodules Lungs: clear to auscultation bilaterally Breasts: normal appearance, no masses or tenderness Heart: regular rate and rhythm, S1, S2 normal, no murmur, click, rub or gallop Abdomen: soft, non-tender; bowel sounds normal; no masses,  no organomegaly Extremities: extremities normal, atraumatic, no cyanosis or edema Pulses: 2+ and symmetric Skin: Skin color, texture, turgor normal. No rashes or lesions Lymph nodes: Cervical, supraclavicular, and axillary nodes normal. Neurologic: Grossly normal    Assessment:    Healthy female exam.      Plan:   Problem List Items Addressed This Visit       Unprioritized   Adult hypothyroidism - Primary    [phone] - check levels and adjust if needed.      Relevant Orders   TSH   T3, free   T4, free   Menopausal and perimenopausal disorder    99214 - On HRT - refills x 1 year given. ? Adding to breast tenderness. ? Need to increase if hot  flashes and night sweats worsen.      Relevant Medications   estradiol (VIVELLE-DOT) 0.025 MG/24HR (Start on 07/31/2023)   Other Visit Diagnoses     Need for influenza vaccination       Relevant Orders   Flu vaccine trivalent PF, 6mos and older(Flulaval,Afluria,Fluarix,Fluzone) (Completed)   Screen for STD (sexually transmitted disease)       Relevant Orders   RPR+HBsAg+HCVAb+...   Encounter for gynecological examination without abnormal finding          Return in 1 year (on 07/29/2024).    See After Visit Summary for Counseling Recommendations

## 2023-07-30 NOTE — Assessment & Plan Note (Signed)
16109 - check levels and adjust if needed.

## 2023-07-31 LAB — T4, FREE: Free T4: 1.31 ng/dL (ref 0.82–1.77)

## 2023-07-31 LAB — RPR+HBSAG+HCVAB+...
HIV Screen 4th Generation wRfx: NONREACTIVE
Hep C Virus Ab: NONREACTIVE
Hepatitis B Surface Ag: NEGATIVE
RPR Ser Ql: NONREACTIVE

## 2023-07-31 LAB — TSH: TSH: 0.254 u[IU]/mL — ABNORMAL LOW (ref 0.450–4.500)

## 2023-07-31 LAB — T3, FREE: T3, Free: 3.3 pg/mL (ref 2.0–4.4)

## 2023-08-10 ENCOUNTER — Other Ambulatory Visit: Payer: Self-pay | Admitting: Family Medicine

## 2023-08-10 DIAGNOSIS — N959 Unspecified menopausal and perimenopausal disorder: Secondary | ICD-10-CM

## 2023-08-11 ENCOUNTER — Ambulatory Visit
Admission: RE | Admit: 2023-08-11 | Discharge: 2023-08-11 | Disposition: A | Discharge status: Home / Self Care | Payer: Managed Care, Other (non HMO) | Source: Ambulatory Visit | Attending: Family Medicine | Admitting: Family Medicine

## 2023-08-11 DIAGNOSIS — Z1231 Encounter for screening mammogram for malignant neoplasm of breast: Secondary | ICD-10-CM | POA: Insufficient documentation

## 2023-08-18 ENCOUNTER — Encounter: Payer: Self-pay | Admitting: Internal Medicine

## 2023-08-18 ENCOUNTER — Ambulatory Visit: Payer: Managed Care, Other (non HMO) | Admitting: Internal Medicine

## 2023-08-18 VITALS — BP 100/80 | HR 97 | Ht 62.0 in | Wt 133.0 lb

## 2023-08-18 DIAGNOSIS — F411 Generalized anxiety disorder: Secondary | ICD-10-CM

## 2023-08-18 DIAGNOSIS — F4310 Post-traumatic stress disorder, unspecified: Secondary | ICD-10-CM | POA: Diagnosis not present

## 2023-08-18 DIAGNOSIS — J3081 Allergic rhinitis due to animal (cat) (dog) hair and dander: Secondary | ICD-10-CM

## 2023-08-18 DIAGNOSIS — Z0130 Encounter for examination of blood pressure without abnormal findings: Secondary | ICD-10-CM

## 2023-08-18 DIAGNOSIS — E782 Mixed hyperlipidemia: Secondary | ICD-10-CM | POA: Diagnosis not present

## 2023-08-18 NOTE — Progress Notes (Signed)
Established Patient Office Visit  Subjective:  Patient ID: Theresa Walton, female    DOB: [birth year]  Age: 57 y.o. MRN: [id number]  Chief Complaint  Patient presents with   Follow-up    4 mo    Patient comes in for her follow-up today.  She is generally feeling well but continues to have allergies related recurrent nasal and sinus congestion.  She is under care of ENT. Patient also had her GYN evaluation for the year.  She had thyroid panel studies done by them which are stable. However needs a fasting lipid panel which she will get done here. Occasionally gets a flareup of her IBS which alternates between diarrhea and constipation.    No other concerns at this time.   Past Medical History:  Diagnosis Date   Basal cell carcinoma (BCC) of jawline 06/26/2018   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HCC)    Genital herpes    Hypothyroidism    Seasonal allergies    Syncope and collapse    Thyroid disease    hypothyroidism    Past Surgical History:  Procedure Laterality Date   ABDOMINAL HYSTERECTOMY     ANTERIOR CRUCIATE LIGAMENT REPAIR Right 2012   ARTHROSCOPIC REPAIR ACL  2013   Right knee   BLADDER REPAIR     BREAST ENHANCEMENT SURGERY  1995   BREAST IMPLANT REMOVAL Bilateral 12/12/2021   COLONOSCOPY WITH PROPOFOL N/A 08/29/2017   Procedure: COLONOSCOPY WITH PROPOFOL;  Surgeon: Wyline Mood, MD;  Location: [facility] ENDOSCOPY;  Service: Gastroenterology;  Laterality: N/A;   CYSTOCELE REPAIR  2013   ENDOMETRIAL ABLATION  2013   NASAL SEPTUM SURGERY  1987, 1991, 2001   PARTIAL HYSTERECTOMY  2015   PARTIAL HYSTERECTOMY Bilateral 2014   RECTOPEXY  2011   TONSILECTOMY, ADENOIDECTOMY, BILATERAL MYRINGOTOMY AND TUBES  1984   TOOTH EXTRACTION      Social History   Socioeconomic History   Marital status: Divorced    Spouse name: Not on file   Number of children: Not on file   Years of education: Not on file   Highest education level: Not on file  Occupational History   Not on file   Tobacco Use   Smoking status: Former    Current packs/day: 0.00    Types: Cigarettes    Quit date: 05/15/2015    Years since quitting: 8.2   Smokeless tobacco: Never  Vaping Use   Vaping status: Never Used  Substance and Sexual Activity   Alcohol use: No    Alcohol/week: 0.0 standard drinks of alcohol   Drug use: No   Sexual activity: Not Currently    Partners: Male    Birth control/protection: Surgical  Other Topics Concern   Not on file  Social History Narrative   Not on file   Social Determinants of Health   Financial Resource Strain: Not on file  Food Insecurity: Not on file  Transportation Needs: Not on file  Physical Activity: Not on file  Stress: Not on file  Social Connections: Not on file  Intimate Partner Violence: Not on file    Family History  Problem Relation Age of Onset   Pulmonary fibrosis Mother    Healthy Sister    Healthy Brother    Healthy Sister    Lung cancer Paternal Grandmother    Arrhythmia Father    Heart disease Father    Prostate cancer Neg Hx    Bladder Cancer Neg Hx    Kidney cancer Neg Hx  Breast cancer Neg Hx     Allergies  Allergen Reactions   Cefaclor Anaphylaxis   Cephalosporins Anaphylaxis   Lubiprostone     Other reaction(s): Angioedema Tongue Swelling x 2 times.  Other reaction(s): Angioedema Tongue Swelling x 2 times.    Celecoxib    Milk (Cow)     Other reaction(s): Other (See Comments) GI Upset to milk protein   Apple Juice Nausea And Vomiting   Pseudoephedrine Palpitations    Tachycardia    Review of Systems  Constitutional: Negative.  Negative for chills, diaphoresis, fever, malaise/fatigue and weight loss.  HENT: Negative.  Negative for hearing loss and sore throat.   Eyes: Negative.   Respiratory: Negative.  Negative for cough and shortness of breath.   Cardiovascular: Negative.  Negative for chest pain, palpitations and leg swelling.  Gastrointestinal: Negative.  Negative for abdominal pain,  constipation, diarrhea, heartburn, nausea and vomiting.  Genitourinary: Negative.  Negative for dysuria and flank pain.  Musculoskeletal: Negative.  Negative for joint pain and myalgias.  Skin: Negative.   Neurological: Negative.  Negative for dizziness and headaches.  Endo/Heme/Allergies: Negative.   Psychiatric/Behavioral: Negative.  Negative for depression and suicidal ideas. The patient is not nervous/anxious.        Objective:   BP 100/80   Pulse 97   Ht 5\' 2"  (1.575 m)   Wt 133 lb (60.3 kg)   SpO2 99%   BMI 24.33 kg/m   Vitals:   08/18/23 1301  BP: 100/80  Pulse: 97  Height: 5\' 2"  (1.575 m)  Weight: 133 lb (60.3 kg)  SpO2: 99%  BMI (Calculated): 24.32    Physical Exam Vitals and nursing note reviewed.  Constitutional:      Appearance: Normal appearance.  HENT:     Head: Normocephalic and atraumatic.     Nose: Nose normal.     Mouth/Throat:     Mouth: Mucous membranes are moist.     Pharynx: Oropharynx is clear.  Eyes:     Conjunctiva/sclera: Conjunctivae normal.     Pupils: Pupils are equal, round, and reactive to light.  Cardiovascular:     Rate and Rhythm: Normal rate and regular rhythm.     Pulses: Normal pulses.     Heart sounds: Normal heart sounds. No murmur heard. Pulmonary:     Effort: Pulmonary effort is normal.     Breath sounds: Normal breath sounds. No wheezing.  Abdominal:     General: Bowel sounds are normal.     Palpations: Abdomen is soft.     Tenderness: There is no abdominal tenderness. There is no right CVA tenderness or left CVA tenderness.  Musculoskeletal:        General: Normal range of motion.     Cervical back: Normal range of motion.     Right lower leg: No edema.     Left lower leg: No edema.  Skin:    General: Skin is warm and dry.  Neurological:     General: No focal deficit present.     Mental Status: She is alert and oriented to person, place, and time.  Psychiatric:        Mood and Affect: Mood normal.         Behavior: Behavior normal.      No results found for any visits on 08/18/23.  Recent Results (from the past 2160 hour(s))  RPR+HBsAg+HCVAb+...     Status: None   Collection Time: 07/30/23  1:58 PM  Result Value Ref Range  Hepatitis B Surface Ag Negative Negative   Hep C Virus Ab Non Reactive Non Reactive    Comment: HCV antibody alone does not differentiate between previously resolved infection and active infection. Equivocal and Reactive HCV antibody results should be followed up with an HCV RNA test to support the diagnosis of active HCV infection.    RPR Ser Ql Non Reactive Non Reactive   HIV Screen 4th Generation wRfx Non Reactive Non Reactive    Comment: HIV-1/HIV-2 antibodies and HIV-1 p24 antigen were NOT detected. There is no laboratory evidence of HIV infection. HIV Negative   TSH     Status: Abnormal   Collection Time: 07/30/23  1:58 PM  Result Value Ref Range   TSH 0.254 (L) 0.450 - 4.500 uIU/mL  T3, free     Status: None   Collection Time: 07/30/23  1:58 PM  Result Value Ref Range   T3, Free 3.3 2.0 - 4.4 pg/mL  T4, free     Status: None   Collection Time: 07/30/23  1:58 PM  Result Value Ref Range   Free T4 1.31 0.82 - 1.77 ng/dL      Assessment & Plan:  Patient will continue current medications. Will return for fasting lipid. Problem List Items Addressed This Visit     Allergic rhinitis   Mixed hyperlipidemia - Primary   Relevant Orders   CMP14+EGFR   Lipid Panel w/o Chol/HDL Ratio   Post traumatic stress disorder (PTSD)   GAD (generalized anxiety disorder)    Return in about 6 months (around 02/16/2024).   Total time spent: 30 minutes  Margaretann Loveless, MD  08/18/2023   This document may have been prepared by [facility] Voice Recognition software and as such may include unintentional dictation errors.

## 2023-08-28 LAB — CMP14+EGFR
ALT: 15 [IU]/L (ref 0–32)
AST: 17 [IU]/L (ref 0–40)
Albumin: 4.4 g/dL (ref 3.8–4.9)
Alkaline Phosphatase: 70 [IU]/L (ref 44–121)
BUN/Creatinine Ratio: 17 (ref 9–23)
BUN: 12 mg/dL (ref 6–24)
Bilirubin Total: 0.3 mg/dL (ref 0.0–1.2)
CO2: 22 mmol/L (ref 20–29)
Calcium: 9.8 mg/dL (ref 8.7–10.2)
Chloride: 104 mmol/L (ref 96–106)
Creatinine, Ser: 0.71 mg/dL (ref 0.57–1.00)
Globulin, Total: 2 g/dL (ref 1.5–4.5)
Glucose: 99 mg/dL (ref 70–99)
Potassium: 4.3 mmol/L (ref 3.5–5.2)
Sodium: 139 mmol/L (ref 134–144)
Total Protein: 6.4 g/dL (ref 6.0–8.5)
eGFR: 98 mL/min/{1.73_m2} (ref >59)

## 2023-08-28 LAB — LIPID PANEL W/O CHOL/HDL RATIO
Cholesterol, Total: 128 mg/dL (ref 100–199)
HDL: 58 mg/dL (ref >39)
LDL Chol Calc (NIH): 54 mg/dL (ref 0–99)
Triglycerides: 82 mg/dL (ref 0–149)
VLDL Cholesterol Cal: 16 mg/dL (ref 5–40)

## 2023-09-02 NOTE — Progress Notes (Signed)
Patient notified

## 2023-09-11 ENCOUNTER — Encounter: Payer: Self-pay | Admitting: Physician Assistant

## 2023-09-11 ENCOUNTER — Telehealth: Payer: Self-pay

## 2023-09-11 ENCOUNTER — Ambulatory Visit (INDEPENDENT_AMBULATORY_CARE_PROVIDER_SITE_OTHER): Payer: Managed Care, Other (non HMO) | Admitting: Physician Assistant

## 2023-09-11 VITALS — BP 110/75 | HR 81 | Temp 98.5°F | Resp 16 | Ht 62.0 in | Wt 135.8 lb

## 2023-09-11 DIAGNOSIS — F4312 Post-traumatic stress disorder, chronic: Secondary | ICD-10-CM

## 2023-09-11 DIAGNOSIS — G4700 Insomnia, unspecified: Secondary | ICD-10-CM | POA: Diagnosis not present

## 2023-09-11 NOTE — Progress Notes (Signed)
[facility] [address] [place], [place] [address]  Pulmonary Sleep Medicine   Office Visit Note  Patient Name: Theresa Walton DOB: 01-21-1966 MRN [id number]  Date of Service: 09/11/2023  Complaints/HPI: Pt is here for routine follow up for insomnia. Had been switching between lunesta and zaleplon previously. States states there her PCP put her on prazosin for PTSD and seems to work well and treatment of PTSD has helped with insomnia. Often has not needed sleeping pill with this. Only takes a lunesta rarely if needed. Not using zaleplon at all. Does not need refill today. [facility] had referred her to a sleep coach via telehealth who had suggested going to bed an hour later and has helped some. Pt reports not likeing the telehealth coach visits and has not continued with these, but is following sleep schedule pattern. Does not plan to follow up with [facility] either since doing better and since OON. Discussed if sleep is much better and medication not really needed then could just follow up as needed. She states she may ask if her PCP if ok with prescribing lunesta if needed occasionally in future, but does not think she will need this now. She is also at the end of sinus infection, finishing up zpak now. Still some postnasal drip/congestion though. Seeing ENT who referred her to neurology for chronic sinus pressure despite no findings.  ROS  General: (-) fever, (-) chills, (-) night sweats, (-) weakness Skin: (-) rashes, (-) itching,. Eyes: (-) visual changes, (-) redness, (-) itching. Nose and Sinuses: (-) nasal stuffiness or itchiness, (-) postnasal drip, (-) nosebleeds, (-) sinus trouble. Mouth and Throat: (-) sore throat, (-) hoarseness. Neck: (-) swollen glands, (-) enlarged thyroid, (-) neck pain. Respiratory: - cough, (-) bloody sputum, - shortness of breath, - wheezing. Cardiovascular: - ankle swelling, (-) chest pain. Lymphatic: (-) lymph node  enlargement. Neurologic: (-) numbness, (-) tingling. Psychiatric: (-) anxiety, (-) depression   Current Medication: Outpatient Encounter Medications as of 09/11/2023  Medication Sig   cyclobenzaprine (FLEXERIL) 5 MG tablet TAKE 1 TO 2 TABLETS BY MOUTH AT BEDTIME   estradiol (VIVELLE-DOT) 0.025 MG/24HR Place 1 patch onto the skin 2 (two) times a week.   estradiol (VIVELLE-DOT) 0.0375 MG/24HR APPLY 1 PATCH TOPICALLY TO THE SKIN 2 TIMES A WEEK   eszopiclone (LUNESTA) 1 MG TABS tablet TAKE 1 TABLET BY MOUTH AT BEDTIME AS NEEDED FOR SLEEP   levothyroxine (SYNTHROID) 50 MCG tablet Take 1 tablet (50 mcg total) by mouth daily before breakfast.   prazosin (MINIPRESS) 1 MG capsule TAKE 1 CAPSULE(1 MG) BY MOUTH AT BEDTIME   rosuvastatin (CRESTOR) 10 MG tablet TAKE 1 TABLET BY MOUTH EVERY DAY   Trospium Chloride 60 MG CP24 TAKE 1 CAPSULE(60 MG) BY MOUTH DAILY   valACYclovir (VALTREX) 500 MG tablet TAKE 1 TABLET BY MOUTH ONCE A DAY   No facility-administered encounter medications on file as of 09/11/2023.    Surgical History: Past Surgical History:  Procedure Laterality Date   ABDOMINAL HYSTERECTOMY     ANTERIOR CRUCIATE LIGAMENT REPAIR Right 2012   ARTHROSCOPIC REPAIR ACL  2013   Right knee   BLADDER REPAIR     BREAST ENHANCEMENT SURGERY  1995   BREAST IMPLANT REMOVAL Bilateral 12/12/2021   COLONOSCOPY WITH PROPOFOL N/A 08/29/2017   Procedure: COLONOSCOPY WITH PROPOFOL;  Surgeon: Wyline Mood, MD;  Location: [facility] ENDOSCOPY;  Service: Gastroenterology;  Laterality: N/A;   CYSTOCELE REPAIR  2013   ENDOMETRIAL ABLATION  2013   NASAL SEPTUM SURGERY  1987, 1991, 2001   PARTIAL HYSTERECTOMY  2015   PARTIAL HYSTERECTOMY Bilateral 2014   RECTOPEXY  2011   TONSILECTOMY, ADENOIDECTOMY, BILATERAL MYRINGOTOMY AND TUBES  1984   TOOTH EXTRACTION      Medical History: Past Medical History:  Diagnosis Date   Basal cell carcinoma (BCC) of jawline 06/26/2018   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HCC)     Genital herpes    Hypothyroidism    Seasonal allergies    Syncope and collapse    Thyroid disease    hypothyroidism    Family History: Family History  Problem Relation Age of Onset   Pulmonary fibrosis Mother    Healthy Sister    Healthy Brother    Healthy Sister    Lung cancer Paternal Grandmother    Arrhythmia Father    Heart disease Father    Prostate cancer Neg Hx    Bladder Cancer Neg Hx    Kidney cancer Neg Hx    Breast cancer Neg Hx     Social History: Social History   Socioeconomic History   Marital status: Divorced    Spouse name: Not on file   Number of children: Not on file   Years of education: Not on file   Highest education level: Not on file  Occupational History   Not on file  Tobacco Use   Smoking status: Former    Current packs/day: 0.00    Types: Cigarettes    Quit date: 05/15/2015    Years since quitting: 8.3   Smokeless tobacco: Never  Vaping Use   Vaping status: Never Used  Substance and Sexual Activity   Alcohol use: No    Alcohol/week: 0.0 standard drinks of alcohol   Drug use: No   Sexual activity: Not Currently    Partners: Male    Birth control/protection: Surgical  Other Topics Concern   Not on file  Social History Narrative   Not on file   Social Determinants of Health   Financial Resource Strain: Not on file  Food Insecurity: Not on file  Transportation Needs: Not on file  Physical Activity: Not on file  Stress: Not on file  Social Connections: Not on file  Intimate Partner Violence: Not on file    Vital Signs: Blood pressure 110/75, pulse 81, temperature 98.5 F (36.9 C), resp. rate 16, height 5\' 2"  (1.575 m), weight 135 lb 12.8 oz (61.6 kg), SpO2 99%.  Examination: General Appearance: The patient is well-developed, well-nourished, and in no distress. Skin: Gross inspection of skin unremarkable. Head: normocephalic, no gross deformities. Eyes: no gross deformities noted. ENT: ears appear grossly normal no  exudates. Neck: Supple. No thyromegaly. No LAD. Respiratory: Lungs clear to auscultation. Cardiovascular: Normal S1 and S2 without murmur or rub. Extremities: No cyanosis. pulses are equal. Neurologic: Alert and oriented. No involuntary movements.  LABS: Recent Results (from the past 2160 hour(s))  RPR+HBsAg+HCVAb+...     Status: None   Collection Time: 07/30/23  1:58 PM  Result Value Ref Range   Hepatitis B Surface Ag Negative Negative   Hep C Virus Ab Non Reactive Non Reactive    Comment: HCV antibody alone does not differentiate between previously resolved infection and active infection. Equivocal and Reactive HCV antibody results should be followed up with an HCV RNA test to support the diagnosis of active HCV infection.    RPR Ser Ql Non Reactive Non Reactive   HIV Screen 4th Generation wRfx Non Reactive Non  Reactive    Comment: HIV-1/HIV-2 antibodies and HIV-1 p24 antigen were NOT detected. There is no laboratory evidence of HIV infection. HIV Negative   TSH     Status: Abnormal   Collection Time: 07/30/23  1:58 PM  Result Value Ref Range   TSH 0.254 (L) 0.450 - 4.500 uIU/mL  T3, free     Status: None   Collection Time: 07/30/23  1:58 PM  Result Value Ref Range   T3, Free 3.3 2.0 - 4.4 pg/mL  T4, free     Status: None   Collection Time: 07/30/23  1:58 PM  Result Value Ref Range   Free T4 1.31 0.82 - 1.77 ng/dL  [id number]     Status: None   Collection Time: 08/28/23  9:27 AM  Result Value Ref Range   Glucose 99 70 - 99 mg/dL   BUN 12 6 - 24 mg/dL   Creatinine, Ser 7.82 0.57 - 1.00 mg/dL   eGFR 98 >95 AO/ZHY/8.65   BUN/Creatinine Ratio 17 9 - 23   Sodium 139 134 - 144 mmol/L   Potassium 4.3 3.5 - 5.2 mmol/L   Chloride 104 96 - 106 mmol/L   CO2 22 20 - 29 mmol/L   Calcium 9.8 8.7 - 10.2 mg/dL   Total Protein 6.4 6.0 - 8.5 g/dL   Albumin 4.4 3.8 - 4.9 g/dL   Globulin, Total 2.0 1.5 - 4.5 g/dL   Bilirubin Total 0.3 0.0 - 1.2 mg/dL   Alkaline Phosphatase 70 44 -  121 IU/L   AST 17 0 - 40 IU/L   ALT 15 0 - 32 IU/L  Lipid Panel w/o Chol/HDL Ratio     Status: None   Collection Time: 08/28/23  9:27 AM  Result Value Ref Range   Cholesterol, Total 128 100 - 199 mg/dL   Triglycerides 82 0 - 149 mg/dL   HDL 58 >78 mg/dL   VLDL Cholesterol Cal 16 5 - 40 mg/dL   LDL Chol Calc (NIH) 54 0 - 99 mg/dL    Radiology: MM 3D SCREENING MAMMOGRAM BILATERAL BREAST  Result Date: 08/13/2023 CLINICAL DATA:  Screening. EXAM: DIGITAL SCREENING BILATERAL MAMMOGRAM WITH TOMOSYNTHESIS AND CAD TECHNIQUE: Bilateral screening digital craniocaudal and mediolateral oblique mammograms were obtained. Bilateral screening digital breast tomosynthesis was performed. The images were evaluated with computer-aided detection. COMPARISON:  Previous exam(s). ACR Breast Density Category c: The breasts are heterogeneously dense, which may obscure small masses. FINDINGS: There are no findings suspicious for malignancy. IMPRESSION: No mammographic evidence of malignancy. A result letter of this screening mammogram will be mailed directly to the patient. RECOMMENDATION: Screening mammogram in one year. (Code:SM-B-01Y) BI-RADS CATEGORY  1: Negative. Electronically Signed   By: Baird Lyons M.D.   On: 08/13/2023 12:12    No results found.  No results found.    Assessment and Plan: Patient Active Problem List   Diagnosis Date Noted   GAD (generalized anxiety disorder) 12/30/2022   Other irritable bowel syndrome 12/30/2022   Diarrhea 12/30/2022   Weight loss, unintentional 12/30/2022   Chronic post-traumatic stress disorder (PTSD) 12/03/2022   Mixed hyperlipidemia 12/03/2022   Post traumatic stress disorder (PTSD) 12/03/2022   Fatigue 03/01/2022   Situational anxiety 06/29/2021   Need for shingles vaccine 06/29/2021   Chronic fatigue 06/29/2021   Situational stress 06/29/2021   Pain in joint, lower leg 06/28/2021   Fatigue after COVID-19 vaccination 06/13/2021   Edema 08/05/2018    Impingement syndrome of shoulder region 08/05/2018   Knee joint  effusion 08/05/2018   Old anterior cruciate ligament disruption 08/05/2018   Patellar tendonitis 08/05/2018   Basal cell carcinoma (BCC) of jawline 06/26/2018   History of pubovaginal sling 04/01/2017   Essential tremor 05/15/2016   Allergic rhinitis 06/22/2015   B12 deficiency 06/21/2015   H/O: hysterectomy 06/21/2015   Hypertriglyceridemia 06/21/2015   Hypoglycemia 06/21/2015   Menopausal and perimenopausal disorder 06/21/2015   Vasovagal symptom 06/21/2015   Insomnia 04/19/2015   Female stress incontinence 03/24/2012   Adult hypothyroidism 08/28/2009   Vitamin D insufficiency 08/28/2009   Chronic infection of sinus 02/01/2009   Adaptation reaction 09/09/2008   Genital herpes simplex type 1 infection 07/13/2008    1. Mixed insomnia Improved, only using lunesta occasionally now. Will follow up prn  2. Chronic post-traumatic stress disorder (PTSD) Sleep improved with PTSD treatment with prazosin, followed by PCP   General Counseling: I have discussed the findings of the evaluation and examination with Theresa Walton.  I have also discussed any further diagnostic evaluation thatmay be needed or ordered today. Theresa Walton verbalizes understanding of the findings of todays visit. We also reviewed her medications today and discussed drug interactions and side effects including but not limited excessive drowsiness and altered mental states. We also discussed that there is always a risk not just to her but also people around her. she has been encouraged to call the office with any questions or concerns that should arise related to todays visit.  No orders of the defined types were placed in this encounter.    Time spent: 30  I have personally obtained a history, examined the patient, evaluated laboratory and imaging results, formulated the assessment and plan and placed orders. This patient was seen by Lynn Ito, PA-C in  collaboration with Dr. Freda Munro as a part of collaborative care agreement.     Yevonne Pax, MD [facility] Pulmonary and Critical Care Sleep medicine

## 2023-09-12 ENCOUNTER — Other Ambulatory Visit: Payer: Self-pay | Admitting: Physician Assistant

## 2023-09-12 DIAGNOSIS — G4700 Insomnia, unspecified: Secondary | ICD-10-CM

## 2023-09-12 MED ORDER — ESZOPICLONE 1 MG PO TABS: 1.0000 mg | ORAL_TABLET | Freq: Every evening | ORAL | 1 refills | Status: DC | PRN

## 2023-09-12 NOTE — Telephone Encounter (Signed)
Patient notified

## 2023-09-24 ENCOUNTER — Other Ambulatory Visit: Payer: Self-pay | Admitting: Internal Medicine

## 2023-09-24 DIAGNOSIS — F4312 Post-traumatic stress disorder, chronic: Secondary | ICD-10-CM

## 2023-11-25 ENCOUNTER — Ambulatory Visit: Payer: Managed Care, Other (non HMO) | Admitting: Internal Medicine

## 2023-11-25 ENCOUNTER — Encounter: Payer: Self-pay | Admitting: Internal Medicine

## 2023-11-25 VITALS — BP 88/64 | HR 102 | Ht 62.0 in | Wt 132.2 lb

## 2023-11-25 DIAGNOSIS — R8290 Unspecified abnormal findings in urine: Secondary | ICD-10-CM | POA: Diagnosis not present

## 2023-11-25 DIAGNOSIS — N3000 Acute cystitis without hematuria: Secondary | ICD-10-CM | POA: Diagnosis not present

## 2023-11-25 DIAGNOSIS — N390 Urinary tract infection, site not specified: Secondary | ICD-10-CM | POA: Diagnosis not present

## 2023-11-25 DIAGNOSIS — F411 Generalized anxiety disorder: Secondary | ICD-10-CM

## 2023-11-25 DIAGNOSIS — Z0130 Encounter for examination of blood pressure without abnormal findings: Secondary | ICD-10-CM

## 2023-11-25 LAB — POCT URINALYSIS DIPSTICK
Bilirubin, UA: NEGATIVE
Blood, UA: NEGATIVE
Glucose, UA: NEGATIVE
Ketones, UA: NEGATIVE
Leukocytes, UA: NEGATIVE
Nitrite, UA: POSITIVE
Protein, UA: NEGATIVE
Spec Grav, UA: 1.015 (ref 1.010–1.025)
Urobilinogen, UA: 0.2 U/dL
pH, UA: 7 (ref 5.0–8.0)

## 2023-11-25 MED ORDER — CIPROFLOXACIN HCL 500 MG PO TABS: 500.0000 mg | ORAL_TABLET | Freq: Two times a day (BID) | ORAL | 0 refills | Status: DC

## 2023-11-25 NOTE — Progress Notes (Signed)
Established Patient Office Visit  Subjective:  Patient ID: Theresa Walton, female    DOB: 18-Jun-1966  Age: 58 y.o. MRN: [id number]  Chief Complaint  Patient presents with   Acute Visit    Odor in urine and possible sinus infection    Patient comes in with reports of urine with odor for the last few days.  Patient reports that when she gets urinary tract infection usually that is the first sign.  Her urine dipstick is positive for nitrite.  Will send the specimen to lab for urine culture.   Empirically start Cipro for 3 days.    No other concerns at this time.   Past Medical History:  Diagnosis Date   Basal cell carcinoma (BCC) of jawline 06/26/2018   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HCC)    Genital herpes    Hypothyroidism    Seasonal allergies    Syncope and collapse    Thyroid disease    hypothyroidism    Past Surgical History:  Procedure Laterality Date   ABDOMINAL HYSTERECTOMY     ANTERIOR CRUCIATE LIGAMENT REPAIR Right 2012   ARTHROSCOPIC REPAIR ACL  2013   Right knee   BLADDER REPAIR     BREAST ENHANCEMENT SURGERY  1995   BREAST IMPLANT REMOVAL Bilateral 12/12/2021   COLONOSCOPY WITH PROPOFOL N/A 08/29/2017   Procedure: COLONOSCOPY WITH PROPOFOL;  Surgeon: Wyline Mood, MD;  Location: [facility] ENDOSCOPY;  Service: Gastroenterology;  Laterality: N/A;   CYSTOCELE REPAIR  2013   ENDOMETRIAL ABLATION  2013   NASAL SEPTUM SURGERY  1987, 1991, 2001   PARTIAL HYSTERECTOMY  2015   PARTIAL HYSTERECTOMY Bilateral 2014   RECTOPEXY  2011   TONSILECTOMY, ADENOIDECTOMY, BILATERAL MYRINGOTOMY AND TUBES  1984   TOOTH EXTRACTION      Social History   Socioeconomic History   Marital status: Divorced    Spouse name: Not on file   Number of children: Not on file   Years of education: Not on file   Highest education level: Not on file  Occupational History   Not on file  Tobacco Use   Smoking status: Former    Current packs/day: 0.00    Types: Cigarettes    Quit date:  05/15/2015    Years since quitting: 8.5   Smokeless tobacco: Never  Vaping Use   Vaping status: Never Used  Substance and Sexual Activity   Alcohol use: No    Alcohol/week: 0.0 standard drinks of alcohol   Drug use: No   Sexual activity: Not Currently    Partners: Male    Birth control/protection: Surgical  Other Topics Concern   Not on file  Social History Narrative   Not on file   Social Drivers of Health   Financial Resource Strain: Not on file  Food Insecurity: Not on file  Transportation Needs: Not on file  Physical Activity: Not on file  Stress: Not on file  Social Connections: Not on file  Intimate Partner Violence: Not on file    Family History  Problem Relation Age of Onset   Pulmonary fibrosis Mother    Healthy Sister    Healthy Brother    Healthy Sister    Lung cancer Paternal Grandmother    Arrhythmia Father    Heart disease Father    Prostate cancer Neg Hx    Bladder Cancer Neg Hx    Kidney cancer Neg Hx    Breast cancer Neg Hx     Allergies  Allergen Reactions   Cefaclor  Anaphylaxis   Cephalosporins Anaphylaxis   Lubiprostone     Other reaction(s): Angioedema Tongue Swelling x 2 times.  Other reaction(s): Angioedema Tongue Swelling x 2 times.    Celecoxib    Milk (Cow)     Other reaction(s): Other (See Comments) GI Upset to milk protein   Apple Juice Nausea And Vomiting   Pseudoephedrine Palpitations    Tachycardia    Outpatient Medications Prior to Visit  Medication Sig   cyclobenzaprine (FLEXERIL) 5 MG tablet TAKE 1 TO 2 TABLETS BY MOUTH AT BEDTIME   estradiol (VIVELLE-DOT) 0.0375 MG/24HR APPLY 1 PATCH TOPICALLY TO THE SKIN 2 TIMES A WEEK   eszopiclone (LUNESTA) 1 MG TABS tablet Take 1 tablet (1 mg total) by mouth at bedtime as needed. for sleep   levothyroxine (SYNTHROID) 50 MCG tablet Take 1 tablet (50 mcg total) by mouth daily before breakfast.   nortriptyline (PAMELOR) 10 MG capsule Take 40 mg by mouth at bedtime.   prazosin  (MINIPRESS) 1 MG capsule TAKE 1 CAPSULE(1 MG) BY MOUTH AT BEDTIME   rosuvastatin (CRESTOR) 10 MG tablet TAKE 1 TABLET BY MOUTH EVERY DAY   Trospium Chloride 60 MG CP24 TAKE 1 CAPSULE(60 MG) BY MOUTH DAILY   valACYclovir (VALTREX) 500 MG tablet TAKE 1 TABLET BY MOUTH ONCE A DAY   estradiol (VIVELLE-DOT) 0.025 MG/24HR Place 1 patch onto the skin 2 (two) times a week. (Patient not taking: Reported on 11/25/2023)   No facility-administered medications prior to visit.    Review of Systems  Constitutional: Negative.  Negative for chills, fever, malaise/fatigue and weight loss.  HENT: Negative.  Negative for sinus pain and sore throat.   Eyes: Negative.   Respiratory: Negative.  Negative for cough, shortness of breath and stridor.   Cardiovascular: Negative.  Negative for chest pain, palpitations and leg swelling.  Gastrointestinal: Negative.  Negative for abdominal pain, constipation, diarrhea, heartburn, nausea and vomiting.  Genitourinary: Negative.  Negative for dysuria and flank pain.  Musculoskeletal: Negative.  Negative for joint pain and myalgias.  Skin: Negative.   Neurological: Negative.  Negative for dizziness and headaches.  Endo/Heme/Allergies: Negative.   Psychiatric/Behavioral: Negative.  Negative for depression and suicidal ideas. The patient is not nervous/anxious.        Objective:   BP (!) 88/64   Pulse (!) 102   Ht 5\' 2"  (1.575 m)   Wt 132 lb 3.2 oz (60 kg)   SpO2 99%   BMI 24.18 kg/m   Vitals:   11/25/23 0955  BP: (!) 88/64  Pulse: (!) 102  Height: 5\' 2"  (1.575 m)  Weight: 132 lb 3.2 oz (60 kg)  SpO2: 99%  BMI (Calculated): 24.17    Physical Exam Vitals and nursing note reviewed.  Constitutional:      Appearance: Normal appearance.  HENT:     Head: Normocephalic and atraumatic.     Nose: Nose normal.     Mouth/Throat:     Mouth: Mucous membranes are moist.     Pharynx: Oropharynx is clear.  Eyes:     Conjunctiva/sclera: Conjunctivae normal.      Pupils: Pupils are equal, round, and reactive to light.  Cardiovascular:     Rate and Rhythm: Normal rate and regular rhythm.     Pulses: Normal pulses.     Heart sounds: Normal heart sounds. No murmur heard. Pulmonary:     Effort: Pulmonary effort is normal.     Breath sounds: Normal breath sounds. No wheezing.  Abdominal:  General: Bowel sounds are normal.     Palpations: Abdomen is soft.     Tenderness: There is no abdominal tenderness. There is no right CVA tenderness or left CVA tenderness.  Musculoskeletal:        General: Normal range of motion.     Cervical back: Normal range of motion.     Right lower leg: No edema.     Left lower leg: No edema.  Skin:    General: Skin is warm and dry.  Neurological:     General: No focal deficit present.     Mental Status: She is alert and oriented to person, place, and time.  Psychiatric:        Mood and Affect: Mood normal.        Behavior: Behavior normal.      Results for orders placed or performed in visit on 11/25/23  POCT Urinalysis Dipstick (81002)  Result Value Ref Range   Color, UA yellow    Clarity, UA cloudy    Glucose, UA Negative Negative   Bilirubin, UA neg    Ketones, UA neg    Spec Grav, UA 1.015 1.010 - 1.025   Blood, UA neg    pH, UA 7.0 5.0 - 8.0   Protein, UA Negative Negative   Urobilinogen, UA 0.2 0.2 or 1.0 E.U./dL   Nitrite, UA pos    Leukocytes, UA Negative Negative   Appearance cloudy    Odor yes     Recent Results (from the past 2160 hours)  CMP14+EGFR     Status: None   Collection Time: 08/28/23  9:27 AM  Result Value Ref Range   Glucose 99 70 - 99 mg/dL   BUN 12 6 - 24 mg/dL   Creatinine, Ser 4.09 0.57 - 1.00 mg/dL   eGFR 98 >81 XB/JYN/8.29   BUN/Creatinine Ratio 17 9 - 23   Sodium 139 134 - 144 mmol/L   Potassium 4.3 3.5 - 5.2 mmol/L   Chloride 104 96 - 106 mmol/L   CO2 22 20 - 29 mmol/L   Calcium 9.8 8.7 - 10.2 mg/dL   Total Protein 6.4 6.0 - 8.5 g/dL   Albumin 4.4 3.8 - 4.9  g/dL   Globulin, Total 2.0 1.5 - 4.5 g/dL   Bilirubin Total 0.3 0.0 - 1.2 mg/dL   Alkaline Phosphatase 70 44 - 121 IU/L   AST 17 0 - 40 IU/L   ALT 15 0 - 32 IU/L  Lipid Panel w/o Chol/HDL Ratio     Status: None   Collection Time: 08/28/23  9:27 AM  Result Value Ref Range   Cholesterol, Total 128 100 - 199 mg/dL   Triglycerides 82 0 - 149 mg/dL   HDL 58 >56 mg/dL   VLDL Cholesterol Cal 16 5 - 40 mg/dL   LDL Chol Calc (NIH) 54 0 - 99 mg/dL  POCT Urinalysis Dipstick (21308)     Status: None   Collection Time: 11/25/23 10:04 AM  Result Value Ref Range   Color, UA yellow    Clarity, UA cloudy    Glucose, UA Negative Negative   Bilirubin, UA neg    Ketones, UA neg    Spec Grav, UA 1.015 1.010 - 1.025   Blood, UA neg    pH, UA 7.0 5.0 - 8.0   Protein, UA Negative Negative   Urobilinogen, UA 0.2 0.2 or 1.0 E.U./dL   Nitrite, UA pos    Leukocytes, UA Negative Negative   Appearance cloudy  Odor yes       Assessment & Plan:    Islah was seen today for acute visit.  Diagnoses and all orders for this visit:  Abnormal urine odor -     POCT Urinalysis Dipstick (40981)  Acute cystitis without hematuria -     ciprofloxacin (CIPRO) 500 MG tablet; Take 1 tablet (500 mg total) by mouth 2 (two) times daily for 3 days. -     Urine Culture -     Urinalysis  GAD (generalized anxiety disorder)   Continue meds. Follow up as scheduled.     Total time spent: 25 minutes  Margaretann Loveless, MD  11/25/2023   This document may have been prepared by [facility] Voice Recognition software and as such may include unintentional dictation errors.

## 2023-11-28 ENCOUNTER — Telehealth: Payer: Self-pay | Admitting: Internal Medicine

## 2023-11-28 DIAGNOSIS — N3000 Acute cystitis without hematuria: Secondary | ICD-10-CM

## 2023-11-28 LAB — URINALYSIS
Bilirubin, UA: NEGATIVE
Glucose, UA: NEGATIVE
Ketones, UA: NEGATIVE
Leukocytes,UA: NEGATIVE
Nitrite, UA: POSITIVE — AB
Protein,UA: NEGATIVE
RBC, UA: NEGATIVE
Specific Gravity, UA: 1.013 (ref 1.005–1.030)
Urobilinogen, Ur: 0.2 mg/dL (ref 0.2–1.0)
pH, UA: 6.5 (ref 5.0–7.5)

## 2023-11-28 LAB — URINE CULTURE

## 2023-11-28 MED ORDER — CIPROFLOXACIN HCL 500 MG PO TABS: 500.0000 mg | ORAL_TABLET | Freq: Two times a day (BID) | ORAL | 0 refills | Status: AC

## 2023-11-28 NOTE — Telephone Encounter (Signed)
Patient left VM that she is still not feeling better and hasn't heard back from her urine culture. Culture and urinalysis just came back and I spoke with Dr. Welton Flakes and we sent in 4 more days of Cipro.

## 2023-12-04 ENCOUNTER — Telehealth: Payer: Self-pay | Admitting: Internal Medicine

## 2023-12-04 NOTE — Telephone Encounter (Signed)
 Patient left VM that she has finished the antibiotics for her UTI and she is still having symptoms. Please advise.

## 2023-12-05 ENCOUNTER — Ambulatory Visit (INDEPENDENT_AMBULATORY_CARE_PROVIDER_SITE_OTHER): Payer: Managed Care, Other (non HMO) | Admitting: Internal Medicine

## 2023-12-05 ENCOUNTER — Other Ambulatory Visit: Payer: Self-pay | Admitting: Internal Medicine

## 2023-12-05 ENCOUNTER — Encounter: Payer: Self-pay | Admitting: Internal Medicine

## 2023-12-05 DIAGNOSIS — N3000 Acute cystitis without hematuria: Secondary | ICD-10-CM | POA: Diagnosis not present

## 2023-12-05 DIAGNOSIS — N3090 Cystitis, unspecified without hematuria: Secondary | ICD-10-CM

## 2023-12-05 LAB — POCT URINALYSIS DIPSTICK
Bilirubin, UA: NEGATIVE
Blood, UA: NEGATIVE
Glucose, UA: NEGATIVE
Ketones, UA: NEGATIVE
Leukocytes, UA: NEGATIVE
Nitrite, UA: NEGATIVE
Protein, UA: NEGATIVE
Spec Grav, UA: 1.015 (ref 1.010–1.025)
Urobilinogen, UA: 0.2 U/dL
pH, UA: 7 (ref 5.0–8.0)

## 2023-12-05 MED ORDER — PHENAZOPYRIDINE HCL 100 MG PO TABS
100.0000 mg | ORAL_TABLET | Freq: Three times a day (TID) | ORAL | 0 refills | Status: DC | PRN
Start: 2023-12-05 — End: 2024-02-16

## 2023-12-05 NOTE — Progress Notes (Signed)
 Patient notified

## 2023-12-05 NOTE — Progress Notes (Signed)
 Bladder discomfort. Urine is clear- start pyridium .

## 2023-12-11 ENCOUNTER — Encounter: Payer: Self-pay | Admitting: Internal Medicine

## 2023-12-12 ENCOUNTER — Encounter: Payer: Self-pay | Admitting: Cardiology

## 2023-12-12 ENCOUNTER — Ambulatory Visit (INDEPENDENT_AMBULATORY_CARE_PROVIDER_SITE_OTHER): Payer: Managed Care, Other (non HMO) | Admitting: Cardiology

## 2023-12-12 VITALS — BP 104/60 | HR 95 | Ht 62.0 in | Wt 129.0 lb

## 2023-12-12 DIAGNOSIS — Z0130 Encounter for examination of blood pressure without abnormal findings: Secondary | ICD-10-CM

## 2023-12-12 DIAGNOSIS — R399 Unspecified symptoms and signs involving the genitourinary system: Secondary | ICD-10-CM | POA: Insufficient documentation

## 2023-12-12 LAB — POCT URINALYSIS DIPSTICK
Bilirubin, UA: NEGATIVE
Blood, UA: NEGATIVE
Glucose, UA: NEGATIVE
Ketones, UA: NEGATIVE
Leukocytes, UA: NEGATIVE
Nitrite, UA: NEGATIVE
Protein, UA: NEGATIVE
Spec Grav, UA: 1.01 (ref 1.010–1.025)
Urobilinogen, UA: 0.2 U/dL
pH, UA: 6 (ref 5.0–8.0)

## 2023-12-12 MED ORDER — AMOXICILLIN-POT CLAVULANATE 875-125 MG PO TABS: 1.0000 | ORAL_TABLET | Freq: Two times a day (BID) | ORAL | 0 refills | Status: AC

## 2023-12-12 NOTE — Progress Notes (Signed)
 Established Patient Office Visit  Subjective:  Patient ID: Theresa Walton, female    DOB: 1966-08-05  Age: 58 y.o. MRN: [id number]  Chief Complaint  Patient presents with   Urinary Tract Infection    UTI Symptoms    Patient in office for possible UTI. Patient complains of atypical symptoms common for her. Urine odor. Denies dysuria, flank pain. UA in office normal, will send for culture. Will send in Augmentin. Patient states she has taken it in the past with no adverse effects.   Urinary Tract Infection  This is a new problem. The current episode started 1 to 4 weeks ago. The problem has been unchanged. The patient is experiencing no pain. There has been no fever. Pertinent negatives include no flank pain or urgency.    No other concerns at this time.   Past Medical History:  Diagnosis Date   Basal cell carcinoma (BCC) of jawline 06/26/2018   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HCC)    Genital herpes    Hypothyroidism    Seasonal allergies    Syncope and collapse    Thyroid disease    hypothyroidism    Past Surgical History:  Procedure Laterality Date   ABDOMINAL HYSTERECTOMY     ANTERIOR CRUCIATE LIGAMENT REPAIR Right 2012   ARTHROSCOPIC REPAIR ACL  2013   Right knee   BLADDER REPAIR     BREAST ENHANCEMENT SURGERY  1995   BREAST IMPLANT REMOVAL Bilateral 12/12/2021   COLONOSCOPY WITH PROPOFOL N/A 08/29/2017   Procedure: COLONOSCOPY WITH PROPOFOL;  Surgeon: Wyline Mood, MD;  Location: [facility] ENDOSCOPY;  Service: Gastroenterology;  Laterality: N/A;   CYSTOCELE REPAIR  2013   ENDOMETRIAL ABLATION  2013   NASAL SEPTUM SURGERY  1987, 1991, 2001   PARTIAL HYSTERECTOMY  2015   PARTIAL HYSTERECTOMY Bilateral 2014   RECTOPEXY  2011   TONSILECTOMY, ADENOIDECTOMY, BILATERAL MYRINGOTOMY AND TUBES  1984   TOOTH EXTRACTION      Social History   Socioeconomic History   Marital status: Divorced    Spouse name: Not on file   Number of children: Not on file   Years of education: Not  on file   Highest education level: Not on file  Occupational History   Not on file  Tobacco Use   Smoking status: Former    Current packs/day: 0.00    Types: Cigarettes    Quit date: 05/15/2015    Years since quitting: 8.5   Smokeless tobacco: Never  Vaping Use   Vaping status: Never Used  Substance and Sexual Activity   Alcohol use: No    Alcohol/week: 0.0 standard drinks of alcohol   Drug use: No   Sexual activity: Not Currently    Partners: Male    Birth control/protection: Surgical  Other Topics Concern   Not on file  Social History Narrative   Not on file   Social Drivers of Health   Financial Resource Strain: Not on file  Food Insecurity: Not on file  Transportation Needs: Not on file  Physical Activity: Not on file  Stress: Not on file  Social Connections: Not on file  Intimate Partner Violence: Not on file    Family History  Problem Relation Age of Onset   Pulmonary fibrosis Mother    Healthy Sister    Healthy Brother    Healthy Sister    Lung cancer Paternal Grandmother    Arrhythmia Father    Heart disease Father    Prostate cancer Neg Hx  Bladder Cancer Neg Hx    Kidney cancer Neg Hx    Breast cancer Neg Hx     Allergies  Allergen Reactions   Cefaclor Anaphylaxis   Cephalosporins Anaphylaxis   Lubiprostone     Other reaction(s): Angioedema Tongue Swelling x 2 times.  Other reaction(s): Angioedema Tongue Swelling x 2 times.    Celecoxib    Milk (Cow)     Other reaction(s): Other (See Comments) GI Upset to milk protein   Apple Juice Nausea And Vomiting   Pseudoephedrine Palpitations    Tachycardia    Outpatient Medications Prior to Visit  Medication Sig   cyclobenzaprine (FLEXERIL) 5 MG tablet TAKE 1 TO 2 TABLETS BY MOUTH AT BEDTIME   estradiol (VIVELLE-DOT) 0.025 MG/24HR Place 1 patch onto the skin 2 (two) times a week. (Patient not taking: Reported on 11/25/2023)   estradiol (VIVELLE-DOT) 0.0375 MG/24HR APPLY 1 PATCH TOPICALLY TO THE  SKIN 2 TIMES A WEEK   eszopiclone (LUNESTA) 1 MG TABS tablet Take 1 tablet (1 mg total) by mouth at bedtime as needed. for sleep   levothyroxine (SYNTHROID) 50 MCG tablet Take 1 tablet (50 mcg total) by mouth daily before breakfast.   nortriptyline (PAMELOR) 10 MG capsule Take 40 mg by mouth at bedtime.   phenazopyridine (PYRIDIUM) 100 MG tablet Take 1 tablet (100 mg total) by mouth 3 (three) times daily as needed for pain.   prazosin (MINIPRESS) 1 MG capsule TAKE 1 CAPSULE(1 MG) BY MOUTH AT BEDTIME   rosuvastatin (CRESTOR) 10 MG tablet TAKE 1 TABLET BY MOUTH EVERY DAY   Trospium Chloride 60 MG CP24 TAKE 1 CAPSULE(60 MG) BY MOUTH DAILY   valACYclovir (VALTREX) 500 MG tablet TAKE 1 TABLET BY MOUTH ONCE A DAY   No facility-administered medications prior to visit.    Review of Systems  Constitutional: Negative.   HENT: Negative.    Eyes: Negative.   Respiratory: Negative.  Negative for shortness of breath.   Cardiovascular: Negative.  Negative for chest pain.  Gastrointestinal: Negative.  Negative for abdominal pain, constipation and diarrhea.  Genitourinary: Negative.  Negative for dysuria, flank pain and urgency.  Musculoskeletal:  Negative for joint pain and myalgias.  Skin: Negative.   Neurological: Negative.  Negative for dizziness and headaches.  Endo/Heme/Allergies: Negative.   All other systems reviewed and are negative.      Objective:   BP 104/60   Pulse 95   Ht 5\' 2"  (1.575 m)   Wt 129 lb (58.5 kg)   SpO2 98%   BMI 23.59 kg/m   Vitals:   12/12/23 1425  BP: 104/60  Pulse: 95  Height: 5\' 2"  (1.575 m)  Weight: 129 lb (58.5 kg)  SpO2: 98%  BMI (Calculated): 23.59    Physical Exam Vitals and nursing note reviewed.  Constitutional:      Appearance: Normal appearance. She is normal weight.  HENT:     Head: Normocephalic and atraumatic.     Nose: Nose normal.     Mouth/Throat:     Mouth: Mucous membranes are moist.  Eyes:     Extraocular Movements:  Extraocular movements intact.     Conjunctiva/sclera: Conjunctivae normal.     Pupils: Pupils are equal, round, and reactive to light.  Cardiovascular:     Rate and Rhythm: Normal rate and regular rhythm.     Pulses: Normal pulses.     Heart sounds: Normal heart sounds.  Pulmonary:     Effort: Pulmonary effort is normal.  Breath sounds: Normal breath sounds.  Abdominal:     General: Abdomen is flat. Bowel sounds are normal.     Palpations: Abdomen is soft.  Musculoskeletal:        General: Normal range of motion.     Cervical back: Normal range of motion.  Skin:    General: Skin is warm and dry.  Neurological:     General: No focal deficit present.     Mental Status: She is alert and oriented to person, place, and time.  Psychiatric:        Mood and Affect: Mood normal.        Behavior: Behavior normal.        Thought Content: Thought content normal.        Judgment: Judgment normal.      Results for orders placed or performed in visit on 12/12/23  POCT Urinalysis Dipstick (81002)  Result Value Ref Range   Color, UA     Clarity, UA     Glucose, UA Negative Negative   Bilirubin, UA Negative    Ketones, UA Negative    Spec Grav, UA 1.010 1.010 - 1.025   Blood, UA Negative    pH, UA 6.0 5.0 - 8.0   Protein, UA Negative Negative   Urobilinogen, UA 0.2 0.2 or 1.0 E.U./dL   Nitrite, UA Negative    Leukocytes, UA Negative Negative   Appearance     Odor      Recent Results (from the past 2160 hours)  POCT Urinalysis Dipstick (16109)     Status: None   Collection Time: 11/25/23 10:04 AM  Result Value Ref Range   Color, UA yellow    Clarity, UA cloudy    Glucose, UA Negative Negative   Bilirubin, UA neg    Ketones, UA neg    Spec Grav, UA 1.015 1.010 - 1.025   Blood, UA neg    pH, UA 7.0 5.0 - 8.0   Protein, UA Negative Negative   Urobilinogen, UA 0.2 0.2 or 1.0 E.U./dL   Nitrite, UA pos    Leukocytes, UA Negative Negative   Appearance cloudy    Odor yes    Urine Culture     Status: Abnormal   Collection Time: 11/25/23  3:00 PM   Specimen: Urine   UR  Result Value Ref Range   Urine Culture, Routine Final report (A)    Organism ID, Bacteria Comment (A)     Comment: Staphylococcus epidermidis Based on susceptibility to oxacillin this isolate would be susceptible to: *Penicillinase-stable penicillins, such as:   Cloxacillin, Dicloxacillin, Nafcillin *Beta-lactam combination agents, such as:   Amoxicillin-clavulanic acid, Ampicillin-sulbactam,   Piperacillin-tazobactam *Oral cephems, such as:   Cefaclor, Cefdinir, Cefpodoxime, Cefprozil, Cefuroxime,   Cephalexin, Loracarbef *Parenteral cephems, such as:   Cefazolin, Cefepime, Cefotaxime, Cefotetan, Ceftaroline,   Ceftizoxime, Ceftriaxone, Cefuroxime *Carbapenems, such as:   Doripenem, Ertapenem, Imipenem, Meropenem Greater than 100,000 colony forming units per mL    Antimicrobial Susceptibility Comment     Comment:       ** S = Susceptible; I = Intermediate; R = Resistant **                    P = Positive; N = Negative             MICS are expressed in micrograms per mL    Antibiotic                 RSLT#1  RSLT#2    RSLT#3    RSLT#4 Ciprofloxacin                  S Gentamicin                     S Levofloxacin                   S Linezolid                      S Moxifloxacin                   S Nitrofurantoin                 S Oxacillin                      S Penicillin                     R Quinupristin/Dalfopristin      S Rifampin                       S Tetracycline                   R Trimethoprim/Sulfa             R Vancomycin                     S   Urinalysis     Status: Abnormal   Collection Time: 11/25/23  3:00 PM  Result Value Ref Range   Specific Gravity, UA 1.013 1.005 - 1.030   pH, UA 6.5 5.0 - 7.5   Color, UA Yellow Yellow   Appearance Ur Clear Clear   Leukocytes,UA Negative Negative   Protein,UA Negative Negative/Trace   Glucose, UA Negative Negative    Ketones, UA Negative Negative   RBC, UA Negative Negative   Bilirubin, UA Negative Negative   Urobilinogen, Ur 0.2 0.2 - 1.0 mg/dL   Nitrite, UA Positive (A) Negative  POCT Urinalysis Dipstick (16109)     Status: None   Collection Time: 12/05/23  2:21 PM  Result Value Ref Range   Color, UA yellow    Clarity, UA clear    Glucose, UA Negative Negative   Bilirubin, UA neg    Ketones, UA neg    Spec Grav, UA 1.015 1.010 - 1.025   Blood, UA neg    pH, UA 7.0 5.0 - 8.0   Protein, UA Negative Negative   Urobilinogen, UA 0.2 0.2 or 1.0 E.U./dL   Nitrite, UA neg    Leukocytes, UA Negative Negative   Appearance clear    Odor no   POCT Urinalysis Dipstick (60454)     Status: Normal   Collection Time: 12/12/23  2:33 PM  Result Value Ref Range   Color, UA     Clarity, UA     Glucose, UA Negative Negative   Bilirubin, UA Negative    Ketones, UA Negative    Spec Grav, UA 1.010 1.010 - 1.025   Blood, UA Negative    pH, UA 6.0 5.0 - 8.0   Protein, UA Negative Negative   Urobilinogen, UA 0.2 0.2 or 1.0 E.U./dL   Nitrite, UA Negative    Leukocytes, UA Negative Negative   Appearance     Odor        Assessment &  Plan:  Augmentin sent to pharmacy.  Urine sent for culture.   Problem List Items Addressed This Visit       Other   Urinary tract infection symptoms - Primary   Relevant Orders   POCT Urinalysis Dipstick (16109) (Completed)   Urine Culture    Return if symptoms worsen or fail to improve.   Total time spent: 25 minutes  [employer], NP  12/12/2023   This document may have been prepared by Dragon Voice Recognition software and as such may include unintentional dictation errors.

## 2023-12-14 LAB — URINE CULTURE

## 2023-12-15 ENCOUNTER — Encounter: Payer: Self-pay | Admitting: Cardiology

## 2023-12-15 NOTE — Progress Notes (Signed)
 Informed via Mychart message

## 2023-12-17 ENCOUNTER — Other Ambulatory Visit: Payer: Self-pay | Admitting: Cardiology

## 2023-12-17 DIAGNOSIS — R3989 Other symptoms and signs involving the genitourinary system: Secondary | ICD-10-CM

## 2023-12-17 NOTE — Progress Notes (Signed)
 Marland Kitchen

## 2023-12-19 ENCOUNTER — Ambulatory Visit: Payer: Managed Care, Other (non HMO) | Admitting: Family

## 2023-12-19 DIAGNOSIS — N898 Other specified noninflammatory disorders of vagina: Secondary | ICD-10-CM

## 2023-12-19 DIAGNOSIS — N760 Acute vaginitis: Secondary | ICD-10-CM

## 2023-12-22 LAB — NUSWAB VAGINITIS PLUS (VG+)
Atopobium vaginae: HIGH {score} — AB
Candida albicans, NAA: NEGATIVE
Candida glabrata, NAA: NEGATIVE
Chlamydia trachomatis, NAA: NEGATIVE
Neisseria gonorrhoeae, NAA: NEGATIVE
Trich vag by NAA: NEGATIVE

## 2023-12-23 MED ORDER — METRONIDAZOLE 500 MG PO TABS: 500.0000 mg | ORAL_TABLET | Freq: Two times a day (BID) | ORAL | 0 refills | Status: AC

## 2023-12-23 NOTE — Addendum Note (Signed)
 Addended byKatherine Mantle on: 12/23/2023 09:35 AM   Modules accepted: Orders

## 2023-12-24 ENCOUNTER — Encounter: Payer: Self-pay | Admitting: Obstetrics and Gynecology

## 2023-12-24 ENCOUNTER — Other Ambulatory Visit: Payer: Self-pay | Admitting: Internal Medicine

## 2023-12-30 ENCOUNTER — Encounter: Payer: Self-pay | Admitting: Obstetrics and Gynecology

## 2023-12-30 ENCOUNTER — Ambulatory Visit: Payer: Managed Care, Other (non HMO) | Admitting: Obstetrics and Gynecology

## 2023-12-30 VITALS — BP 111/71 | HR 80

## 2023-12-30 DIAGNOSIS — R350 Frequency of micturition: Secondary | ICD-10-CM | POA: Diagnosis not present

## 2023-12-30 DIAGNOSIS — N898 Other specified noninflammatory disorders of vagina: Secondary | ICD-10-CM | POA: Diagnosis not present

## 2023-12-30 LAB — POCT URINALYSIS DIPSTICK
Bilirubin, UA: NEGATIVE
Blood, UA: NEGATIVE
Glucose, UA: NEGATIVE
Ketones, UA: NEGATIVE
Leukocytes, UA: NEGATIVE
Nitrite, UA: NEGATIVE
Protein, UA: NEGATIVE
Spec Grav, UA: 1.01 (ref 1.010–1.025)
Urobilinogen, UA: 0.2 U/dL
pH, UA: 7 (ref 5.0–8.0)

## 2023-12-30 MED ORDER — ESTRADIOL 0.1 MG/GM VA CREA: TOPICAL_CREAM | VAGINAL | 11 refills | Status: AC

## 2023-12-30 NOTE — Progress Notes (Signed)
 [facility] Return Visit  SUBJECTIVE  History of Present Illness: Theresa Walton is a 58 y.o. female seen in follow-up for OAB. Has been on trospium 60mg  ER daily.  She is having urinary frequency and feeling of discomfort in the vagina. Denies burning with urination.  Urinary odor has resolved. Almost feels a pinching sensation. She had a swab that came back positive for BV. She is not using anything for vaginal moisture. Not sexually active. On estradiol patch.   She did use a bath bomb a few weeks ago but has since threw them away. Usually only uses water to clean the vulva.   Past Medical History: Patient  has a past medical history of Basal cell carcinoma (BCC) of jawline (06/26/2018), 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HCC), Genital herpes, Hypothyroidism, Seasonal allergies, Syncope and collapse, and Thyroid disease.   Past Surgical History: She  has a past surgical history that includes Tonsilectomy, adenoidectomy, bilateral myringotomy and tubes (1984); Nasal septum surgery (1987, 1991, 2001); Breast enhancement surgery (1995); Rectopexy (2011); Cystocele repair (2013); Endometrial ablation (2013); Arthroscopic repair ACL (2013); Partial hysterectomy (2015); Abdominal hysterectomy; Partial hysterectomy (Bilateral, 2014); Anterior cruciate ligament repair (Right, 2012); Bladder repair; Colonoscopy with propofol (N/A, 08/29/2017); Tooth extraction; and Breast implant removal (Bilateral, 12/12/2021).   Medications: She has a current medication list which includes the following prescription(s): [START ON 01/01/2024] estradiol, cyclobenzaprine, estradiol, estradiol, eszopiclone, levothyroxine, nortriptyline, phenazopyridine, prazosin, rosuvastatin, trospium chloride, and valacyclovir.   Allergies: Patient is allergic to cefaclor, cephalosporins, lubiprostone, celecoxib, milk (cow), apple juice, and pseudoephedrine.   Social History: Patient  reports that she quit smoking about 8 years  ago. Her smoking use included cigarettes. She has never used smokeless tobacco. She reports that she does not drink alcohol and does not use drugs.      OBJECTIVE     Physical Exam: Vitals:   12/30/23 1519  BP: 111/71  Pulse: 80     Gen: No apparent distress, A&O x 3.  Detailed Urogynecologic Evaluation:  Normal external genitalia. On speculum, normal vaginal mucosa and normal appearing cervix. No discharge. On bimanual, uterus is small, mobile and nontender. Pelvic floor nontender.     Results for orders placed or performed in visit on 12/30/23  POCT Urinalysis Dipstick   Collection Time: 12/30/23  3:29 PM  Result Value Ref Range   Color, UA Yellow    Clarity, UA Clear    Glucose, UA Negative Negative   Bilirubin, UA Negative    Ketones, UA Negative    Spec Grav, UA 1.010 1.010 - 1.025   Blood, UA Negative    pH, UA 7.0 5.0 - 8.0   Protein, UA Negative Negative   Urobilinogen, UA 0.2 0.2 or 1.0 E.U./dL   Nitrite, UA Negative    Leukocytes, UA Negative Negative   Appearance     Odor      ASSESSMENT AND PLAN    Ms. Melchor is a 58 y.o. with:  1. Urinary frequency   2. Vaginal irritation    - Negative POC urine - Suspect irritation is due to vaginal atrophy. Refill provided for vaginal estrogen. Use 0.5g nightly for two weeks then decrease to twice a week.  - Continue trospium for OAB  Return 2 months  Marguerita Beards, MD

## 2024-01-29 ENCOUNTER — Other Ambulatory Visit: Payer: Self-pay | Admitting: Family Medicine

## 2024-01-29 DIAGNOSIS — N959 Unspecified menopausal and perimenopausal disorder: Secondary | ICD-10-CM

## 2024-02-16 ENCOUNTER — Ambulatory Visit: Payer: Managed Care, Other (non HMO) | Admitting: Internal Medicine

## 2024-02-16 ENCOUNTER — Encounter: Payer: Self-pay | Admitting: Internal Medicine

## 2024-02-16 VITALS — BP 100/70 | HR 89 | Ht 62.0 in | Wt 129.2 lb

## 2024-02-16 DIAGNOSIS — E782 Mixed hyperlipidemia: Secondary | ICD-10-CM

## 2024-02-16 DIAGNOSIS — E039 Hypothyroidism, unspecified: Secondary | ICD-10-CM | POA: Diagnosis not present

## 2024-02-16 DIAGNOSIS — K588 Other irritable bowel syndrome: Secondary | ICD-10-CM

## 2024-02-16 DIAGNOSIS — J301 Allergic rhinitis due to pollen: Secondary | ICD-10-CM | POA: Diagnosis not present

## 2024-02-16 DIAGNOSIS — F411 Generalized anxiety disorder: Secondary | ICD-10-CM

## 2024-02-16 DIAGNOSIS — Z0130 Encounter for examination of blood pressure without abnormal findings: Secondary | ICD-10-CM

## 2024-02-16 DIAGNOSIS — F4310 Post-traumatic stress disorder, unspecified: Secondary | ICD-10-CM

## 2024-02-16 NOTE — Progress Notes (Signed)
 Established Patient Office Visit  Subjective:  Patient ID: Theresa Walton, female    DOB: [birth year]  Age: 58 y.o. MRN: [id number]  Chief Complaint  Patient presents with   Follow-up    6 month follow up    And comes in for follow-up today.  She is generally feeling better but continues to have seasonal allergy symptoms.  She will be evaluated by the ENT.  She has also seen a urogynecologist and has been prescribed topical estrogen for vaginal symptoms, and is tolerating it well.  Patient will return fasting for her blood work.    No other concerns at this time.   Past Medical History:  Diagnosis Date   Basal cell carcinoma (BCC) of jawline 06/26/2018   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HCC)    Genital herpes    Hypothyroidism    Seasonal allergies    Syncope and collapse    Thyroid  disease    hypothyroidism    Past Surgical History:  Procedure Laterality Date   ABDOMINAL HYSTERECTOMY     ANTERIOR CRUCIATE LIGAMENT REPAIR Right 2012   ARTHROSCOPIC REPAIR ACL  2013   Right knee   BLADDER REPAIR     BREAST ENHANCEMENT SURGERY  1995   BREAST IMPLANT REMOVAL Bilateral 12/12/2021   COLONOSCOPY WITH PROPOFOL  N/A 08/29/2017   Procedure: COLONOSCOPY WITH PROPOFOL ;  Surgeon: Luke Salaam, MD;  Location: [facility] ENDOSCOPY;  Service: Gastroenterology;  Laterality: N/A;   CYSTOCELE REPAIR  2013   ENDOMETRIAL ABLATION  2013   NASAL SEPTUM SURGERY  1987, 1991, 2001   PARTIAL HYSTERECTOMY  2015   PARTIAL HYSTERECTOMY Bilateral 2014   RECTOPEXY  2011   TONSILECTOMY, ADENOIDECTOMY, BILATERAL MYRINGOTOMY AND TUBES  1984   TOOTH EXTRACTION      Social History   Socioeconomic History   Marital status: Divorced    Spouse name: Not on file   Number of children: Not on file   Years of education: Not on file   Highest education level: Not on file  Occupational History   Not on file  Tobacco Use   Smoking status: Former    Current packs/day: 0.00    Types: Cigarettes    Quit date:  05/15/2015    Years since quitting: 8.7   Smokeless tobacco: Never  Vaping Use   Vaping status: Never Used  Substance and Sexual Activity   Alcohol use: No    Alcohol/week: 0.0 standard drinks of alcohol   Drug use: No   Sexual activity: Not Currently    Partners: Male    Birth control/protection: Surgical  Other Topics Concern   Not on file  Social History Narrative   Not on file   Social Drivers of Health   Financial Resource Strain: Not on file  Food Insecurity: Not on file  Transportation Needs: Not on file  Physical Activity: Not on file  Stress: Not on file  Social Connections: Not on file  Intimate Partner Violence: Not on file    Family History  Problem Relation Age of Onset   Pulmonary fibrosis Mother    Healthy Sister    Healthy Brother    Healthy Sister    Lung cancer Paternal Grandmother    Arrhythmia Father    Heart disease Father    Prostate cancer Neg Hx    Bladder Cancer Neg Hx    Kidney cancer Neg Hx    Breast cancer Neg Hx     Allergies  Allergen Reactions   Cefaclor Anaphylaxis  Cephalosporins Anaphylaxis   Lubiprostone      Other reaction(s): Angioedema Tongue Swelling x 2 times.  Other reaction(s): Angioedema Tongue Swelling x 2 times.    Celecoxib    Milk (Cow)     Other reaction(s): Other (See Comments) GI Upset to milk protein   Apple Juice Nausea And Vomiting   Pseudoephedrine Palpitations    Tachycardia    Outpatient Medications Prior to Visit  Medication Sig   cyclobenzaprine  (FLEXERIL ) 5 MG tablet TAKE 1 TO 2 TABLETS BY MOUTH AT BEDTIME (Patient taking differently: Take 5 mg by mouth as needed.)   estradiol  (ESTRACE ) 0.1 MG/GM vaginal cream Place 0.5g nightly for two weeks then twice a week after   estradiol  (VIVELLE -DOT) 0.0375 MG/24HR APPLY 1 PATCH TOPICALLY TO THE SKIN 2 TIMES A WEEK   eszopiclone  (LUNESTA ) 1 MG TABS tablet Take 1 tablet (1 mg total) by mouth at bedtime as needed. for sleep   levothyroxine  (SYNTHROID ) 50  MCG tablet Take 1 tablet (50 mcg total) by mouth daily before breakfast.   prazosin  (MINIPRESS ) 1 MG capsule TAKE 1 CAPSULE(1 MG) BY MOUTH AT BEDTIME   rosuvastatin (CRESTOR) 10 MG tablet TAKE 1 TABLET BY MOUTH EVERY DAY   Trospium  Chloride 60 MG CP24 TAKE 1 CAPSULE(60 MG) BY MOUTH DAILY   valACYclovir  (VALTREX ) 500 MG tablet TAKE 1 TABLET BY MOUTH ONCE A DAY (Patient taking differently: Take 500 mg by mouth as needed.)   estradiol  (VIVELLE -DOT) 0.025 MG/24HR Place 1 patch onto the skin 2 (two) times a week. (Patient not taking: Reported on 11/25/2023)   nortriptyline (PAMELOR) 10 MG capsule Take 40 mg by mouth at bedtime. (Patient not taking: Reported on 02/16/2024)   phenazopyridine  (PYRIDIUM ) 100 MG tablet Take 1 tablet (100 mg total) by mouth 3 (three) times daily as needed for pain. (Patient not taking: Reported on 02/16/2024)   No facility-administered medications prior to visit.    Review of Systems  Constitutional: Negative.  Negative for chills, diaphoresis, fever, malaise/fatigue and weight loss.  HENT:  Positive for congestion. Negative for sore throat.   Eyes: Negative.   Respiratory: Negative.  Negative for cough and shortness of breath.   Cardiovascular: Negative.  Negative for chest pain, palpitations and leg swelling.  Gastrointestinal: Negative.  Negative for abdominal pain, constipation, diarrhea, heartburn, nausea and vomiting.  Genitourinary: Negative.  Negative for dysuria and flank pain.  Musculoskeletal: Negative.  Negative for joint pain and myalgias.  Skin: Negative.   Neurological: Negative.  Negative for dizziness, tingling, tremors, sensory change and headaches.  Endo/Heme/Allergies: Negative.   Psychiatric/Behavioral: Negative.  Negative for depression and suicidal ideas. The patient is not nervous/anxious.        Objective:   BP 100/70   Pulse 89   Ht 5\' 2"  (1.575 m)   Wt 129 lb 3.2 oz (58.6 kg)   SpO2 98%   BMI 23.63 kg/m   Vitals:   02/16/24 1306   BP: 100/70  Pulse: 89  Height: 5\' 2"  (1.575 m)  Weight: 129 lb 3.2 oz (58.6 kg)  SpO2: 98%  BMI (Calculated): 23.63    Physical Exam Vitals and nursing note reviewed.  Constitutional:      Appearance: Normal appearance.  HENT:     Head: Normocephalic and atraumatic.     Nose: Nose normal.     Mouth/Throat:     Mouth: Mucous membranes are moist.     Pharynx: Oropharynx is clear.  Eyes:     Conjunctiva/sclera: Conjunctivae normal.  Pupils: Pupils are equal, round, and reactive to light.  Cardiovascular:     Rate and Rhythm: Normal rate and regular rhythm.     Pulses: Normal pulses.     Heart sounds: Normal heart sounds. No murmur heard. Pulmonary:     Effort: Pulmonary effort is normal.     Breath sounds: Normal breath sounds. No wheezing.  Abdominal:     General: Bowel sounds are normal.     Palpations: Abdomen is soft.     Tenderness: There is no abdominal tenderness. There is no right CVA tenderness or left CVA tenderness.  Musculoskeletal:        General: Normal range of motion.     Cervical back: Normal range of motion.     Right lower leg: No edema.     Left lower leg: No edema.  Skin:    General: Skin is warm and dry.  Neurological:     General: No focal deficit present.     Mental Status: She is alert and oriented to person, place, and time.  Psychiatric:        Mood and Affect: Mood normal.        Behavior: Behavior normal.      No results found for any visits on 02/16/24.  Recent Results (from the past 2160 hours)  POCT Urinalysis Dipstick (09811)     Status: None   Collection Time: 11/25/23 10:04 AM  Result Value Ref Range   Color, UA yellow    Clarity, UA cloudy    Glucose, UA Negative Negative   Bilirubin, UA neg    Ketones, UA neg    Spec Grav, UA 1.015 1.010 - 1.025   Blood, UA neg    pH, UA 7.0 5.0 - 8.0   Protein, UA Negative Negative   Urobilinogen, UA 0.2 0.2 or 1.0 E.U./dL   Nitrite, UA pos    Leukocytes, UA Negative Negative    Appearance cloudy    Odor yes   Urine Culture     Status: Abnormal   Collection Time: 11/25/23  3:00 PM   Specimen: Urine   UR  Result Value Ref Range   Urine Culture, Routine Final report (A)    Organism ID, Bacteria Comment (A)     Comment: Staphylococcus epidermidis Based on susceptibility to oxacillin this isolate would be susceptible to: *Penicillinase-stable penicillins, such as:   Cloxacillin, Dicloxacillin, Nafcillin *Beta-lactam combination agents, such as:   Amoxicillin -clavulanic acid, Ampicillin-sulbactam,   Piperacillin-tazobactam *Oral cephems, such as:   Cefaclor, Cefdinir, Cefpodoxime, Cefprozil, Cefuroxime,   Cephalexin, Loracarbef *Parenteral cephems, such as:   Cefazolin, Cefepime, Cefotaxime, Cefotetan, Ceftaroline,   Ceftizoxime, Ceftriaxone, Cefuroxime *Carbapenems, such as:   Doripenem, Ertapenem, Imipenem, Meropenem Greater than 100,000 colony forming units per mL    Antimicrobial Susceptibility Comment     Comment:       ** S = Susceptible; I = Intermediate; R = Resistant **                    P = Positive; N = Negative             MICS are expressed in micrograms per mL    Antibiotic                 RSLT#1    RSLT#2    RSLT#3    RSLT#4 Ciprofloxacin                   S Gentamicin  S Levofloxacin                   S Linezolid                      S Moxifloxacin                   S Nitrofurantoin                  S Oxacillin                      S Penicillin                     R Quinupristin/Dalfopristin      S Rifampin                       S Tetracycline                   R Trimethoprim/Sulfa             R Vancomycin                     S   Urinalysis     Status: Abnormal   Collection Time: 11/25/23  3:00 PM  Result Value Ref Range   Specific Gravity, UA 1.013 1.005 - 1.030   pH, UA 6.5 5.0 - 7.5   Color, UA Yellow Yellow   Appearance Ur Clear Clear   Leukocytes,UA Negative Negative   Protein,UA Negative Negative/Trace    Glucose, UA Negative Negative   Ketones, UA Negative Negative   RBC, UA Negative Negative   Bilirubin, UA Negative Negative   Urobilinogen, Ur 0.2 0.2 - 1.0 mg/dL   Nitrite, UA Positive (A) Negative  POCT Urinalysis Dipstick (40981)     Status: None   Collection Time: 12/05/23  2:21 PM  Result Value Ref Range   Color, UA yellow    Clarity, UA clear    Glucose, UA Negative Negative   Bilirubin, UA neg    Ketones, UA neg    Spec Grav, UA 1.015 1.010 - 1.025   Blood, UA neg    pH, UA 7.0 5.0 - 8.0   Protein, UA Negative Negative   Urobilinogen, UA 0.2 0.2 or 1.0 E.U./dL   Nitrite, UA neg    Leukocytes, UA Negative Negative   Appearance clear    Odor no   POCT Urinalysis Dipstick ([id number])     Status: Normal   Collection Time: 12/12/23  2:33 PM  Result Value Ref Range   Color, UA     Clarity, UA     Glucose, UA Negative Negative   Bilirubin, UA Negative    Ketones, UA Negative    Spec Grav, UA 1.010 1.010 - 1.025   Blood, UA Negative    pH, UA 6.0 5.0 - 8.0   Protein, UA Negative Negative   Urobilinogen, UA 0.2 0.2 or 1.0 E.U./dL   Nitrite, UA Negative    Leukocytes, UA Negative Negative   Appearance     Odor    Urine Culture     Status: None   Collection Time: 12/12/23  4:21 PM   Specimen: Urine   UR  Result Value Ref Range   Urine Culture, Routine Final report    Organism ID, Bacteria Comment     Comment: Mixed urogenital flora 10,000-25,000 colony forming units per  mL   NuSwab Vaginitis Plus (VG+)     Status: Abnormal   Collection Time: 12/19/23  1:49 PM  Result Value Ref Range   Atopobium vaginae High - 2 (A) Score   BVAB 2 Low - 0 Score   Megasphaera 1 Low - 0 Score    Comment: Calculate total score by adding the 3 individual bacterial vaginosis (BV) marker scores together.  Total score is interpreted as follows: Total score 0-1: Indicates the absence of BV. Total score   2: Indeterminate for BV. Additional clinical                  data should be  evaluated to establish a                  diagnosis. Total score 3-6: Indicates the presence of BV.    Candida albicans, NAA Negative Negative   Candida glabrata, NAA Negative Negative   Trich vag by NAA Negative Negative   Chlamydia trachomatis, NAA Negative Negative   Neisseria gonorrhoeae, NAA Negative Negative  POCT Urinalysis Dipstick     Status: None   Collection Time: 12/30/23  3:29 PM  Result Value Ref Range   Color, UA Yellow    Clarity, UA Clear    Glucose, UA Negative Negative   Bilirubin, UA Negative    Ketones, UA Negative    Spec Grav, UA 1.010 1.010 - 1.025   Blood, UA Negative    pH, UA 7.0 5.0 - 8.0   Protein, UA Negative Negative   Urobilinogen, UA 0.2 0.2 or 1.0 E.U./dL   Nitrite, UA Negative    Leukocytes, UA Negative Negative   Appearance     Odor        Assessment & Plan:  Continue current medications.  Check fasting labs. Problem List Items Addressed This Visit     Adult hypothyroidism   Relevant Orders   TSH+T4F+T3Free   Allergic rhinitis   Mixed hyperlipidemia - Primary   Relevant Orders   Lipid Panel w/o Chol/HDL Ratio   Lipid Panel w/o Chol/HDL Ratio   Post traumatic stress disorder (PTSD)   GAD (generalized anxiety disorder)   Other irritable bowel syndrome    Return in about 4 months (around 06/17/2024).   Total time spent: 30 minutes  Aisha Hove, MD  02/16/2024   This document may have been prepared by [facility] Voice Recognition software and as such may include unintentional dictation errors.

## 2024-02-25 LAB — LIPID PANEL W/O CHOL/HDL RATIO
Cholesterol, Total: 126 mg/dL (ref 100–199)
HDL: 53 mg/dL (ref >39)
LDL Chol Calc (NIH): 53 mg/dL (ref 0–99)
Triglycerides: 112 mg/dL (ref 0–149)
VLDL Cholesterol Cal: 20 mg/dL (ref 5–40)

## 2024-02-25 LAB — TSH+T4F+T3FREE
Free T4: 1.21 ng/dL (ref 0.82–1.77)
T3, Free: 3.1 pg/mL (ref 2.0–4.4)
TSH: 2.12 u[IU]/mL (ref 0.450–4.500)

## 2024-03-02 NOTE — Progress Notes (Signed)
 Patient notified

## 2024-03-10 ENCOUNTER — Encounter: Payer: Self-pay | Admitting: Obstetrics and Gynecology

## 2024-03-10 ENCOUNTER — Ambulatory Visit: Admitting: Obstetrics and Gynecology

## 2024-03-10 VITALS — BP 112/75 | HR 74

## 2024-03-10 DIAGNOSIS — N3281 Overactive bladder: Secondary | ICD-10-CM

## 2024-03-10 NOTE — Progress Notes (Signed)
 [facility] Return Visit  SUBJECTIVE  History of Present Illness: Theresa Walton is a 58 y.o. female seen in follow-up for OAB. Has been on trospium  60mg  ER daily.  No longer having any vaginal pinching sensation- vaginal cream has helped a lot. She is using it twice a week.   She is frustrated with the trospium . Sometimes she will get the orange/ white capsules that work better for her, other times she gets white capsules and she has more urgency and frequency symptoms.   Past Medical History: Patient  has a past medical history of Basal cell carcinoma (BCC) of jawline (06/26/2018), 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HCC), Genital herpes, Hypothyroidism, Seasonal allergies, Syncope and collapse, and Thyroid  disease.   Past Surgical History: She  has a past surgical history that includes Tonsilectomy, adenoidectomy, bilateral myringotomy and tubes (1984); Nasal septum surgery (1987, 1991, 2001); Breast enhancement surgery (1995); Rectopexy (2011); Cystocele repair (2013); Endometrial ablation (2013); Arthroscopic repair ACL (2013); Partial hysterectomy (2015); Abdominal hysterectomy; Partial hysterectomy (Bilateral, 2014); Anterior cruciate ligament repair (Right, 2012); Bladder repair; Colonoscopy with propofol  (N/A, 08/29/2017); Tooth extraction; and Breast implant removal (Bilateral, 12/12/2021).   Medications: She has a current medication list which includes the following prescription(s): cyclobenzaprine , estradiol , estradiol , eszopiclone , levothyroxine , prazosin , rosuvastatin, trospium  chloride, and valacyclovir .   Allergies: Patient is allergic to cefaclor, cephalosporins, lubiprostone , celecoxib, milk (cow), apple juice, and pseudoephedrine.   Social History: Patient  reports that she quit smoking about 8 years ago. Her smoking use included cigarettes. She has never used smokeless tobacco. She reports that she does not drink alcohol and does not use drugs.      OBJECTIVE      Physical Exam: Vitals:   03/10/24 1448  BP: 112/75  Pulse: 74   Gen: No apparent distress, A&O x 3.  Detailed Urogynecologic Evaluation:  deferred    ASSESSMENT AND PLAN    Theresa Walton is a 58 y.o. with:  1. Overactive bladder     - Continue vaginal estrogen twice a week.   - For refractory OAB we reviewed the procedure for intravesical Botox injection with cystoscopy in the office and reviewed the risks, benefits and alternatives of treatment including but not limited to infection, need for self-catheterization and need for repeat therapy.  We discussed that there is a 5-15% chance of needing to catheterize with Botox and that this usually resolves in a few months; however can persist for longer periods of time.  Typically Botox injections would need to be repeated every 3-12 months since this is not a permanent therapy.  - We discussed the role of sacral neuromodulation and how it works. It requires a test phase, and documentation of bladder function via diary. After a successful test period, a permanent wire and generator are placed in the OR. The battery lasts 5 years on average and would need to be replaced surgically.  The goal of this therapy is at least a 50% improvement in symptoms. It is NOT realistic to expect a 100% cure.  We reviewed the fact that about 30% of patients fail the test phase and are not candidates for permanent generator placement.   - We also discussed the role of percutaneous tibial nerve stimulation and how it works.  She understands it requires 12 weekly visits for temporary neuromodulation of the sacral nerve roots via the tibial nerve and that she may then require continued tapered treatment.  This would not be an option for her since she lives in [place].  -  She is interested in intravesical botox. Will get insurance approval then schedule her for the procedure and self cath teaching. Handout provided for her to review.    Theresa Lamp,  MD

## 2024-04-16 ENCOUNTER — Other Ambulatory Visit: Payer: Self-pay | Admitting: Internal Medicine

## 2024-04-16 ENCOUNTER — Encounter: Payer: Self-pay | Admitting: Internal Medicine

## 2024-04-20 ENCOUNTER — Other Ambulatory Visit: Payer: Self-pay | Admitting: Physician Assistant

## 2024-04-20 DIAGNOSIS — G4700 Insomnia, unspecified: Secondary | ICD-10-CM

## 2024-05-04 ENCOUNTER — Ambulatory Visit: Payer: Self-pay | Admitting: Cardiology

## 2024-05-04 ENCOUNTER — Ambulatory Visit: Admitting: Internal Medicine

## 2024-05-04 ENCOUNTER — Encounter: Payer: Self-pay | Admitting: Cardiology

## 2024-05-04 VITALS — BP 110/72 | HR 93 | Ht 62.0 in | Wt 130.8 lb

## 2024-05-04 DIAGNOSIS — R399 Unspecified symptoms and signs involving the genitourinary system: Secondary | ICD-10-CM

## 2024-05-04 DIAGNOSIS — Z0130 Encounter for examination of blood pressure without abnormal findings: Secondary | ICD-10-CM

## 2024-05-04 LAB — POCT URINALYSIS DIPSTICK
Bilirubin, UA: NEGATIVE
Blood, UA: NEGATIVE
Glucose, UA: NEGATIVE
Ketones, UA: NEGATIVE
Leukocytes, UA: NEGATIVE
Nitrite, UA: NEGATIVE
Protein, UA: NEGATIVE
Spec Grav, UA: 1.015 (ref 1.010–1.025)
Urobilinogen, UA: 0.2 U/dL
pH, UA: 6 (ref 5.0–8.0)

## 2024-05-04 MED ORDER — CIPROFLOXACIN HCL 500 MG PO TABS: 500.0000 mg | ORAL_TABLET | Freq: Every day | ORAL | 0 refills | Status: AC

## 2024-05-04 NOTE — Progress Notes (Signed)
 Established Patient Office Visit  Subjective:  Patient ID: Theresa Walton, female    DOB: 1966-06-18  Age: 58 y.o. MRN: [id number]  Chief Complaint  Patient presents with   Urinary Tract Infection    Patient in office for an acute visit, thinks she may have a UTI. Patient reports urinary frequency, flank pain, typical symptoms for patient when she has a UTI.  UA today clean, will send for culture. Will send in Cipro .  Urinary Tract Infection  This is a new problem. The current episode started in the past 7 days. The problem occurs every urination. The problem has been unchanged. The quality of the pain is described as aching. There has been no fever. Associated symptoms include frequency. The treatment provided no relief.    No other concerns at this time.   Past Medical History:  Diagnosis Date   Basal cell carcinoma (BCC) of jawline 06/26/2018   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HCC)    Genital herpes    Hypothyroidism    Seasonal allergies    Syncope and collapse    Thyroid  disease    hypothyroidism    Past Surgical History:  Procedure Laterality Date   ABDOMINAL HYSTERECTOMY     ANTERIOR CRUCIATE LIGAMENT REPAIR Right 2012   ARTHROSCOPIC REPAIR ACL  2013   Right knee   BLADDER REPAIR     BREAST ENHANCEMENT SURGERY  1995   BREAST IMPLANT REMOVAL Bilateral 12/12/2021   COLONOSCOPY WITH PROPOFOL  N/A 08/29/2017   Procedure: COLONOSCOPY WITH PROPOFOL ;  Surgeon: Therisa Bi, MD;  Location: [facility] ENDOSCOPY;  Service: Gastroenterology;  Laterality: N/A;   CYSTOCELE REPAIR  2013   ENDOMETRIAL ABLATION  2013   NASAL SEPTUM SURGERY  1987, 1991, 2001   PARTIAL HYSTERECTOMY  2015   PARTIAL HYSTERECTOMY Bilateral 2014   RECTOPEXY  2011   TONSILECTOMY, ADENOIDECTOMY, BILATERAL MYRINGOTOMY AND TUBES  1984   TOOTH EXTRACTION      Social History   Socioeconomic History   Marital status: Divorced    Spouse name: Not on file   Number of children: Not on file   Years of education:  Not on file   Highest education level: Not on file  Occupational History   Not on file  Tobacco Use   Smoking status: Former    Current packs/day: 0.00    Types: Cigarettes    Quit date: 05/15/2015    Years since quitting: 8.9   Smokeless tobacco: Never  Vaping Use   Vaping status: Never Used  Substance and Sexual Activity   Alcohol use: No    Alcohol/week: 0.0 standard drinks of alcohol   Drug use: No   Sexual activity: Not Currently    Partners: Male    Birth control/protection: Surgical  Other Topics Concern   Not on file  Social History Narrative   Not on file   Social Drivers of Health   Financial Resource Strain: Not on file  Food Insecurity: Not on file  Transportation Needs: Not on file  Physical Activity: Not on file  Stress: Not on file  Social Connections: Not on file  Intimate Partner Violence: Not on file    Family History  Problem Relation Age of Onset   Pulmonary fibrosis Mother    Healthy Sister    Healthy Brother    Healthy Sister    Lung cancer Paternal Grandmother    Arrhythmia Father    Heart disease Father    Prostate cancer Neg Hx    Bladder Cancer  Neg Hx    Kidney cancer Neg Hx    Breast cancer Neg Hx     Allergies  Allergen Reactions   Cefaclor Anaphylaxis   Cephalosporins Anaphylaxis   Lubiprostone      Other reaction(s): Angioedema Tongue Swelling x 2 times.  Other reaction(s): Angioedema Tongue Swelling x 2 times.    Celecoxib    Milk (Cow)     Other reaction(s): Other (See Comments) GI Upset to milk protein   Apple Juice Nausea And Vomiting   Pseudoephedrine Palpitations    Tachycardia    Outpatient Medications Prior to Visit  Medication Sig   cyclobenzaprine  (FLEXERIL ) 5 MG tablet TAKE 1 TO 2 TABLETS BY MOUTH AT BEDTIME (Patient taking differently: Take 5 mg by mouth as needed.)   estradiol  (ESTRACE ) 0.1 MG/GM vaginal cream Place 0.5g nightly for two weeks then twice a week after   estradiol  (VIVELLE -DOT) 0.0375  MG/24HR APPLY 1 PATCH TOPICALLY TO THE SKIN 2 TIMES A WEEK   eszopiclone  (LUNESTA ) 1 MG TABS tablet Take 1 tablet (1 mg total) by mouth at bedtime as needed. for sleep   levothyroxine  (SYNTHROID ) 50 MCG tablet Take 1 tablet (50 mcg total) by mouth daily before breakfast.   prazosin  (MINIPRESS ) 1 MG capsule TAKE 1 CAPSULE(1 MG) BY MOUTH AT BEDTIME   rosuvastatin (CRESTOR) 10 MG tablet TAKE 1 TABLET BY MOUTH EVERY DAY   Trospium  Chloride 60 MG CP24 TAKE 1 CAPSULE(60 MG) BY MOUTH DAILY   valACYclovir  (VALTREX ) 500 MG tablet TAKE 1 TABLET BY MOUTH ONCE A DAY (Patient taking differently: Take 500 mg by mouth as needed.)   No facility-administered medications prior to visit.    Review of Systems  Constitutional: Negative.   HENT: Negative.    Eyes: Negative.   Respiratory: Negative.  Negative for shortness of breath.   Cardiovascular: Negative.  Negative for chest pain.  Gastrointestinal: Negative.  Negative for abdominal pain, constipation and diarrhea.  Genitourinary:  Positive for frequency.  Musculoskeletal:  Negative for joint pain and myalgias.  Skin: Negative.   Neurological: Negative.  Negative for dizziness and headaches.  Endo/Heme/Allergies: Negative.   All other systems reviewed and are negative.      Objective:   BP 110/72   Pulse 93   Ht 5' 2 (1.575 m)   Wt 130 lb 12.8 oz (59.3 kg)   SpO2 99%   BMI 23.92 kg/m   Vitals:   05/04/24 1136  BP: 110/72  Pulse: 93  Height: 5' 2 (1.575 m)  Weight: 130 lb 12.8 oz (59.3 kg)  SpO2: 99%  BMI (Calculated): 23.92    Physical Exam Vitals and nursing note reviewed.  Constitutional:      Appearance: Normal appearance. She is normal weight.  HENT:     Head: Normocephalic and atraumatic.     Nose: Nose normal.     Mouth/Throat:     Mouth: Mucous membranes are moist.  Eyes:     Extraocular Movements: Extraocular movements intact.     Conjunctiva/sclera: Conjunctivae normal.     Pupils: Pupils are equal, round, and  reactive to light.  Cardiovascular:     Rate and Rhythm: Normal rate and regular rhythm.     Pulses: Normal pulses.     Heart sounds: Normal heart sounds.  Pulmonary:     Effort: Pulmonary effort is normal.     Breath sounds: Normal breath sounds.  Abdominal:     General: Abdomen is flat. Bowel sounds are normal.  Palpations: Abdomen is soft.  Musculoskeletal:        General: Normal range of motion.     Cervical back: Normal range of motion.  Skin:    General: Skin is warm and dry.  Neurological:     General: No focal deficit present.     Mental Status: She is alert and oriented to person, place, and time.  Psychiatric:        Mood and Affect: Mood normal.        Behavior: Behavior normal.        Thought Content: Thought content normal.        Judgment: Judgment normal.      Results for orders placed or performed in visit on 05/04/24  POCT Urinalysis Dipstick (81002)  Result Value Ref Range   Color, UA yellow    Clarity, UA clear    Glucose, UA Negative Negative   Bilirubin, UA negative    Ketones, UA negative    Spec Grav, UA 1.015 1.010 - 1.025   Blood, UA negative    pH, UA 6.0 5.0 - 8.0   Protein, UA Negative Negative   Urobilinogen, UA 0.2 0.2 or 1.0 E.U./dL   Nitrite, UA negative    Leukocytes, UA Negative Negative   Appearance clear    Odor none     Recent Results (from the past 2160 hours)  Lipid Panel w/o Chol/HDL Ratio     Status: None   Collection Time: 02/25/24  8:52 AM  Result Value Ref Range   Cholesterol, Total 126 100 - 199 mg/dL   Triglycerides 887 0 - 149 mg/dL   HDL 53 >60 mg/dL   VLDL Cholesterol Cal 20 5 - 40 mg/dL   LDL Chol Calc (NIH) 53 0 - 99 mg/dL  UDY+[id number]     Status: None   Collection Time: 02/25/24  8:52 AM  Result Value Ref Range   TSH 2.120 0.450 - 4.500 uIU/mL   T3, Free 3.1 2.0 - 4.4 pg/mL   Free T4 1.21 0.82 - 1.77 ng/dL  POCT Urinalysis Dipstick (18997)     Status: Normal   Collection Time: 05/04/24 11:43 AM   Result Value Ref Range   Color, UA yellow    Clarity, UA clear    Glucose, UA Negative Negative   Bilirubin, UA negative    Ketones, UA negative    Spec Grav, UA 1.015 1.010 - 1.025   Blood, UA negative    pH, UA 6.0 5.0 - 8.0   Protein, UA Negative Negative   Urobilinogen, UA 0.2 0.2 or 1.0 E.U./dL   Nitrite, UA negative    Leukocytes, UA Negative Negative   Appearance clear    Odor none       Assessment & Plan:  Urine culture Cipro   Problem List Items Addressed This Visit       Other   Urinary tract infection symptoms - Primary   Relevant Orders   POCT Urinalysis Dipstick (18997) (Completed)   Urine Culture    Return if symptoms worsen or fail to improve.   Total time spent: 25 minutes  Google, NP  05/04/2024   This document may have been prepared by Dragon Voice Recognition software and as such may include unintentional dictation errors.

## 2024-05-07 LAB — URINE CULTURE

## 2024-05-10 ENCOUNTER — Other Ambulatory Visit: Payer: Self-pay | Admitting: Internal Medicine

## 2024-05-10 DIAGNOSIS — E039 Hypothyroidism, unspecified: Secondary | ICD-10-CM

## 2024-05-10 NOTE — Progress Notes (Signed)
Pt informed

## 2024-05-11 ENCOUNTER — Ambulatory Visit (INDEPENDENT_AMBULATORY_CARE_PROVIDER_SITE_OTHER)

## 2024-05-11 DIAGNOSIS — Z719 Counseling, unspecified: Secondary | ICD-10-CM

## 2024-05-11 DIAGNOSIS — Z4682 Encounter for fitting and adjustment of non-vascular catheter: Secondary | ICD-10-CM

## 2024-05-11 NOTE — Progress Notes (Signed)
 Theresa Walton came in for a cath teaching.  Pt was able to demonstrate with ease self-cathing with a 12 fr catheter.  Pt  urethra and lubricant for cathing. Teaching material was given to the patient along with sample catheters.

## 2024-05-19 ENCOUNTER — Other Ambulatory Visit: Payer: Self-pay | Admitting: Cardiology

## 2024-05-19 DIAGNOSIS — E039 Hypothyroidism, unspecified: Secondary | ICD-10-CM

## 2024-05-24 ENCOUNTER — Other Ambulatory Visit: Payer: Self-pay | Admitting: Obstetrics and Gynecology

## 2024-05-24 DIAGNOSIS — N3281 Overactive bladder: Secondary | ICD-10-CM

## 2024-05-24 MED ORDER — CIPROFLOXACIN HCL 500 MG PO TABS: 500.0000 mg | ORAL_TABLET | Freq: Once | ORAL | 0 refills | Status: DC | PRN

## 2024-05-24 NOTE — Progress Notes (Signed)
 Patient has been notified and aware of instructions.

## 2024-05-24 NOTE — Progress Notes (Signed)
 Ciprofloxacin  sent to pharmacy for procedure

## 2024-05-27 ENCOUNTER — Ambulatory Visit: Admitting: Obstetrics and Gynecology

## 2024-05-27 ENCOUNTER — Encounter: Payer: Self-pay | Admitting: Obstetrics and Gynecology

## 2024-05-27 VITALS — BP 98/66 | HR 86

## 2024-05-27 DIAGNOSIS — N3281 Overactive bladder: Secondary | ICD-10-CM | POA: Diagnosis not present

## 2024-05-27 DIAGNOSIS — R350 Frequency of micturition: Secondary | ICD-10-CM

## 2024-05-27 LAB — POCT URINALYSIS DIP (CLINITEK)
Bilirubin, UA: NEGATIVE
Blood, UA: NEGATIVE
Glucose, UA: NEGATIVE mg/dL
Ketones, POC UA: NEGATIVE mg/dL
Leukocytes, UA: NEGATIVE
Nitrite, UA: NEGATIVE
POC PROTEIN,UA: NEGATIVE
Spec Grav, UA: 1.01 (ref 1.010–1.025)
Urobilinogen, UA: 0.2 U/dL
pH, UA: 7 (ref 5.0–8.0)

## 2024-05-27 MED ORDER — LIDOCAINE HCL URETHRAL/MUCOSAL 2 % EX GEL
1.0000 | Freq: Once | CUTANEOUS | Status: AC
  Administered 2024-05-27: 1 via URETHRAL

## 2024-05-27 MED ORDER — ONABOTULINUMTOXINA 100 UNITS IJ SOLR
100.0000 [IU] | Freq: Once | INTRAMUSCULAR | Status: AC
  Administered 2024-05-27: 100 [IU] via INTRAMUSCULAR

## 2024-05-27 MED ORDER — LIDOCAINE HCL 2 % IJ SOLN
20.0000 mL | Freq: Once | INTRAMUSCULAR | Status: AC
  Administered 2024-05-27: 400 mg

## 2024-05-27 NOTE — Patient Instructions (Addendum)
 Taking Care of Yourself after Urodynamics, Cystoscopy, Bulkamid Injection, or Botox  Injection   Drink plenty of water for a day or two following your procedure. Try to have about 8 ounces (one cup) at a time, and do this 6 times or more per day unless you have fluid restrictitons AVOID irritative beverages such as coffee, tea, soda, alcoholic or citrus drinks for a day or two, as this may cause burning with urination.  For the first 1-2 days after the procedure, your urine may be pink or red in color. You may have some blood in your urine as a normal side effect of the procedure. Large amounts of bleeding or difficulty urinating are NOT normal. Call the nurse line if this happens or go to the nearest Emergency Room if the bleeding is heavy or you cannot urinate at all and it is after hours.  You may experience some discomfort or a burning sensation with urination after having this procedure. You can use over the counter Azo or pyridium  to help with burning and follow the instructions on the packaging. If it does not improve within 1-2 days, or other symptoms appear (fever, chills, or difficulty urinating) call the office to speak to a nurse.  You may return to normal daily activities such as work, school, driving, exercising and housework on the day of the procedure. Stop taking your overactive bladder medication (Trospium ) after today

## 2024-05-27 NOTE — Progress Notes (Signed)
 Intravesical Botox  Procedure:  58 y.o. yo F with OAB presenting today for intravesical botox .   Vitals:   05/27/24 1112  BP: 98/66  Pulse: 86    Results for orders placed or performed in visit on 05/27/24 (from the past 24 hours)  POCT URINALYSIS DIP (CLINITEK)     Status: None   Collection Time: 05/27/24 12:38 PM  Result Value Ref Range   Color, UA yellow yellow   Clarity, UA clear clear   Glucose, UA negative negative mg/dL   Bilirubin, UA negative negative   Ketones, POC UA negative negative mg/dL   Spec Grav, UA 8.989 8.989 - 1.025   Blood, UA negative negative   pH, UA 7.0 5.0 - 8.0   POC PROTEIN,UA negative negative, trace   Urobilinogen, UA 0.2 0.2 or 1.0 E.U./dL   Nitrite, UA Negative Negative   Leukocytes, UA Negative Negative     Prior to the procedure, the patient took ciprofloxacin  for antibiotic prophylaxis. Time out was performed. The bladder was catherized and 50 ml of 2% lidocaine  was placed in the bladder and 10 ml of 2% lidocaine  jelly placed in the urethra.  Cystoscopy was performed with sterile H2O and flexible cystoscope. Bladder mucosa was noted to be within normal limits. A total of 10ml / 100 units of Botox  A,  Lot # [id number] Exp 06/2026  was injected in the detrusor muscle via 5 injections of 2ml each. These were spaced about 1 cm apart. Care was taken to avoid the ureteral orifices and the trigone. Patient tolerated the procedure well.  Impression: 59 y.o. 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 Patient tolerated procedure well.  Plan: Post-procedure instructions given regarding bleeding, infection, urinary retention.  Self-catheterization teaching was previously performed.    Patient will follow up in 4 weeks All questions answered.  Rosaline LOISE Caper, MD

## 2024-05-30 ENCOUNTER — Other Ambulatory Visit: Payer: Self-pay | Admitting: Physician Assistant

## 2024-05-30 DIAGNOSIS — G4700 Insomnia, unspecified: Secondary | ICD-10-CM

## 2024-06-01 ENCOUNTER — Encounter: Payer: Self-pay | Admitting: Internal Medicine

## 2024-06-11 ENCOUNTER — Other Ambulatory Visit: Payer: Self-pay | Admitting: Internal Medicine

## 2024-06-11 DIAGNOSIS — B009 Herpesviral infection, unspecified: Secondary | ICD-10-CM

## 2024-06-14 ENCOUNTER — Encounter: Payer: Self-pay | Admitting: Cardiology

## 2024-06-14 ENCOUNTER — Other Ambulatory Visit: Payer: Self-pay | Admitting: Obstetrics and Gynecology

## 2024-06-14 ENCOUNTER — Ambulatory Visit: Payer: Self-pay | Admitting: Cardiology

## 2024-06-14 ENCOUNTER — Ambulatory Visit: Admitting: Cardiology

## 2024-06-14 VITALS — BP 100/68 | HR 75 | Ht 62.0 in | Wt 127.4 lb

## 2024-06-14 DIAGNOSIS — W57XXXA Bitten or stung by nonvenomous insect and other nonvenomous arthropods, initial encounter: Secondary | ICD-10-CM | POA: Diagnosis not present

## 2024-06-14 DIAGNOSIS — R399 Unspecified symptoms and signs involving the genitourinary system: Secondary | ICD-10-CM

## 2024-06-14 DIAGNOSIS — Z0130 Encounter for examination of blood pressure without abnormal findings: Secondary | ICD-10-CM

## 2024-06-14 DIAGNOSIS — J0100 Acute maxillary sinusitis, unspecified: Secondary | ICD-10-CM

## 2024-06-14 DIAGNOSIS — B009 Herpesviral infection, unspecified: Secondary | ICD-10-CM

## 2024-06-14 DIAGNOSIS — N3281 Overactive bladder: Secondary | ICD-10-CM

## 2024-06-14 LAB — POCT URINALYSIS DIPSTICK OB
Bilirubin, UA: NEGATIVE
Blood, UA: NEGATIVE
Glucose, UA: NEGATIVE
Ketones, UA: NEGATIVE
Leukocytes, UA: NEGATIVE
Nitrite, UA: NEGATIVE
POC,PROTEIN,UA: NEGATIVE
Spec Grav, UA: >=1.03 — AB (ref 1.010–1.025)
Urobilinogen, UA: 0.2 U/dL
pH, UA: 6.5 (ref 5.0–8.0)

## 2024-06-14 MED ORDER — METHYLPREDNISOLONE 4 MG PO TBPK: ORAL_TABLET | ORAL | 0 refills | Status: DC

## 2024-06-14 MED ORDER — LEVOFLOXACIN 500 MG PO TABS: 500.0000 mg | ORAL_TABLET | Freq: Every day | ORAL | 0 refills | Status: AC

## 2024-06-14 MED ORDER — VALACYCLOVIR HCL 500 MG PO TABS: 500.0000 mg | ORAL_TABLET | ORAL | 0 refills | Status: AC | PRN

## 2024-06-14 NOTE — Progress Notes (Signed)
 Established Patient Office Visit  Subjective:  Patient ID: Theresa Walton, female    DOB: 11/06/1965  Age: 58 y.o. MRN: [id number]  Chief Complaint  Patient presents with   Acute Visit    Possible bug bite on left ear swollen after 2 days has iced and taken benadyrl as well as used the cream. Possible sinus infection as well as UTI, and flare up of herpes. Noticed around Saturday morning.     Patient in office for an acute visit with multiple complaints. Patient thinks she may have been bitten by a bug on her left ear. Patient reports using oral and topical Benadryl with no relief. Also iced the area which minimized the edema until ice removed.  Possible sinus infection. Patient complaining of sinus and teeth pain on the right side of her face, sore throat, and congestion. Tried nasal decongestant with no relief.  Possible UTI. Patient complains of nausea with urinary urgency, typical UTI symptoms for her.  Possible Herpes outbreak. Patient has a history of genital herpes. States she is out of Valtrex  at home, will send in a refill.  Will send I medrol  dose pack for the bug bite. Will send urine for culture. Will send in levofloxacin  for bug bite, sinus infection and possible UTI.    Sinus Problem This is a new problem. The current episode started in the past 7 days. The problem has been gradually worsening since onset. The pain is mild. Associated symptoms include congestion, headaches, sinus pressure and a sore throat. Pertinent negatives include no coughing or sneezing. Past treatments include nasal decongestants. The treatment provided no relief.  Urinary Tract Infection  This is a new problem. The current episode started in the past 7 days. The problem occurs every urination. The problem has been unchanged. The quality of the pain is described as aching. The pain is mild. There has been no fever. Associated symptoms include frequency, nausea and urgency. She has tried nothing for the  symptoms. The treatment provided no relief.    No other concerns at this time.   Past Medical History:  Diagnosis Date   Basal cell carcinoma (BCC) of jawline 06/26/2018   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HCC)    Genital herpes    Hypothyroidism    Seasonal allergies    Syncope and collapse    Thyroid  disease    hypothyroidism    Past Surgical History:  Procedure Laterality Date   ABDOMINAL HYSTERECTOMY     ANTERIOR CRUCIATE LIGAMENT REPAIR Right 2012   ARTHROSCOPIC REPAIR ACL  2013   Right knee   BLADDER REPAIR     BREAST ENHANCEMENT SURGERY  1995   BREAST IMPLANT REMOVAL Bilateral 12/12/2021   COLONOSCOPY WITH PROPOFOL  N/A 08/29/2017   Procedure: COLONOSCOPY WITH PROPOFOL ;  Surgeon: Therisa Bi, MD;  Location: [facility] ENDOSCOPY;  Service: Gastroenterology;  Laterality: N/A;   CYSTOCELE REPAIR  2013   ENDOMETRIAL ABLATION  2013   NASAL SEPTUM SURGERY  1987, 1991, 2001   PARTIAL HYSTERECTOMY  2015   PARTIAL HYSTERECTOMY Bilateral 2014   RECTOPEXY  2011   TONSILECTOMY, ADENOIDECTOMY, BILATERAL MYRINGOTOMY AND TUBES  1984   TOOTH EXTRACTION      Social History   Socioeconomic History   Marital status: Divorced    Spouse name: Not on file   Number of children: Not on file   Years of education: Not on file   Highest education level: Not on file  Occupational History   Not on file  Tobacco Use  Smoking status: Former    Current packs/day: 0.00    Types: Cigarettes    Quit date: 05/15/2015    Years since quitting: 9.0   Smokeless tobacco: Never  Vaping Use   Vaping status: Never Used  Substance and Sexual Activity   Alcohol use: No    Alcohol/week: 0.0 standard drinks of alcohol   Drug use: No   Sexual activity: Not Currently    Partners: Male    Birth control/protection: Surgical  Other Topics Concern   Not on file  Social History Narrative   Not on file   Social Drivers of Health   Financial Resource Strain: Not on file  Food Insecurity: Not on file   Transportation Needs: Not on file  Physical Activity: Not on file  Stress: Not on file  Social Connections: Not on file  Intimate Partner Violence: Not on file    Family History  Problem Relation Age of Onset   Pulmonary fibrosis Mother    Healthy Sister    Healthy Brother    Healthy Sister    Lung cancer Paternal Grandmother    Arrhythmia Father    Heart disease Father    Prostate cancer Neg Hx    Bladder Cancer Neg Hx    Kidney cancer Neg Hx    Breast cancer Neg Hx     Allergies  Allergen Reactions   Cefaclor Anaphylaxis   Cephalosporins Anaphylaxis   Lubiprostone      Other reaction(s): Angioedema Tongue Swelling x 2 times.  Other reaction(s): Angioedema Tongue Swelling x 2 times.    Celecoxib    Milk (Cow)     Other reaction(s): Other (See Comments) GI Upset to milk protein   Apple Juice Nausea And Vomiting   Pseudoephedrine Palpitations    Tachycardia    Outpatient Medications Prior to Visit  Medication Sig   cyclobenzaprine  (FLEXERIL ) 5 MG tablet TAKE 1 TO 2 TABLETS BY MOUTH AT BEDTIME (Patient taking differently: Take 5 mg by mouth as needed.)   estradiol  (ESTRACE ) 0.1 MG/GM vaginal cream Place 0.5g nightly for two weeks then twice a week after   estradiol  (VIVELLE -DOT) 0.0375 MG/24HR APPLY 1 PATCH TOPICALLY TO THE SKIN 2 TIMES A WEEK   eszopiclone  (LUNESTA ) 1 MG TABS tablet Take 1 tablet (1 mg total) by mouth at bedtime as needed. for sleep   levothyroxine  (SYNTHROID ) 50 MCG tablet TAKE 1 TABLET(50 MCG) BY MOUTH DAILY BEFORE BREAKFAST   prazosin  (MINIPRESS ) 1 MG capsule TAKE 1 CAPSULE(1 MG) BY MOUTH AT BEDTIME   rosuvastatin (CRESTOR) 10 MG tablet TAKE 1 TABLET BY MOUTH EVERY DAY   [DISCONTINUED] valACYclovir  (VALTREX ) 500 MG tablet TAKE 1 TABLET BY MOUTH ONCE A DAY (Patient taking differently: Take 500 mg by mouth as needed.)   Trospium  Chloride 60 MG CP24 TAKE 1 CAPSULE(60 MG) BY MOUTH DAILY (Patient not taking: Reported on 06/14/2024)   [DISCONTINUED]  ciprofloxacin  (CIPRO ) 500 MG tablet Take 1 tablet (500 mg total) by mouth once as needed for up to 1 dose (prior to procedure).   No facility-administered medications prior to visit.    Review of Systems  HENT:  Positive for congestion, sinus pressure, sinus pain and sore throat. Negative for sneezing.   Respiratory:  Positive for sputum production. Negative for cough.   Gastrointestinal:  Positive for nausea.  Genitourinary:  Positive for frequency and urgency.  Neurological:  Positive for headaches.       Objective:   BP 100/68   Pulse 75   Ht  5' 2 (1.575 m)   Wt 127 lb 6.4 oz (57.8 kg)   SpO2 98%   BMI 23.30 kg/m   Vitals:   06/14/24 1121  BP: 100/68  Pulse: 75  Height: 5' 2 (1.575 m)  Weight: 127 lb 6.4 oz (57.8 kg)  SpO2: 98%  BMI (Calculated): 23.3    Physical Exam   Results for orders placed or performed in visit on 06/14/24  POC Urinalysis Dipstick OB  Result Value Ref Range   Color, UA     Clarity, UA     Glucose, UA Negative Negative   Bilirubin, UA negative    Ketones, UA negative    Spec Grav, UA >=1.030 (A) 1.010 - 1.025   Blood, UA negative    pH, UA 6.5 5.0 - 8.0   POC,PROTEIN,UA Negative Negative, Trace, Small (1+), Moderate (2+), Large (3+), 4+   Urobilinogen, UA 0.2 0.2 or 1.0 E.U./dL   Nitrite, UA negative    Leukocytes, UA Negative Negative   Appearance     Odor      Recent Results (from the past 2160 hours)  POCT Urinalysis Dipstick (18997)     Status: Normal   Collection Time: 05/04/24 11:43 AM  Result Value Ref Range   Color, UA yellow    Clarity, UA clear    Glucose, UA Negative Negative   Bilirubin, UA negative    Ketones, UA negative    Spec Grav, UA 1.015 1.010 - 1.025   Blood, UA negative    pH, UA 6.0 5.0 - 8.0   Protein, UA Negative Negative   Urobilinogen, UA 0.2 0.2 or 1.0 E.U./dL   Nitrite, UA negative    Leukocytes, UA Negative Negative   Appearance clear    Odor none   Urine Culture     Status: None    Collection Time: 05/04/24 11:55 AM   Specimen: Urine   UR  Result Value Ref Range   Urine Culture, Routine Final report    Organism ID, Bacteria Lactobacillus species     Comment: 10,000-25,000 colony forming units per mL Susceptibility not normally performed on this organism.   POCT URINALYSIS DIP (CLINITEK)     Status: None   Collection Time: 05/27/24 12:38 PM  Result Value Ref Range   Color, UA yellow yellow   Clarity, UA clear clear   Glucose, UA negative negative mg/dL   Bilirubin, UA negative negative   Ketones, POC UA negative negative mg/dL   Spec Grav, UA 8.989 8.989 - 1.025   Blood, UA negative negative   pH, UA 7.0 5.0 - 8.0   POC PROTEIN,UA negative negative, trace   Urobilinogen, UA 0.2 0.2 or 1.0 E.U./dL   Nitrite, UA Negative Negative   Leukocytes, UA Negative Negative  POC Urinalysis Dipstick OB     Status: Abnormal   Collection Time: 06/14/24 11:48 AM  Result Value Ref Range   Color, UA     Clarity, UA     Glucose, UA Negative Negative   Bilirubin, UA negative    Ketones, UA negative    Spec Grav, UA >=1.030 (A) 1.010 - 1.025   Blood, UA negative    pH, UA 6.5 5.0 - 8.0   POC,PROTEIN,UA Negative Negative, Trace, Small (1+), Moderate (2+), Large (3+), 4+   Urobilinogen, UA 0.2 0.2 or 1.0 E.U./dL   Nitrite, UA negative    Leukocytes, UA Negative Negative   Appearance     Odor  Assessment & Plan:  Valtrex  refilled Urine culture Medrol  dose pack Levofloxacin   Problem List Items Addressed This Visit       Respiratory   Acute non-recurrent maxillary sinusitis   Relevant Medications   valACYclovir  (VALTREX ) 500 MG tablet   methylPREDNISolone  (MEDROL  DOSEPAK) 4 MG TBPK tablet   levofloxacin  (LEVAQUIN ) 500 MG tablet     Other   Urinary tract infection symptoms   Relevant Orders   Urine Culture   Recurrent herpes simplex - Primary   Relevant Medications   valACYclovir  (VALTREX ) 500 MG tablet   Other Relevant Orders   POC Urinalysis  Dipstick OB (Completed)   Bug bite    Return if symptoms worsen or fail to improve, for as scheduled with NK.   Total time spent: 25 minutes  Google, NP  06/14/2024   This document may have been prepared by Dragon Voice Recognition software and as such may include unintentional dictation errors.

## 2024-06-17 ENCOUNTER — Telehealth: Payer: Self-pay | Admitting: Internal Medicine

## 2024-06-17 ENCOUNTER — Ambulatory Visit: Admitting: Internal Medicine

## 2024-06-17 ENCOUNTER — Encounter: Payer: Self-pay | Admitting: Internal Medicine

## 2024-06-17 VITALS — BP 98/64 | HR 78 | Ht 62.0 in | Wt 128.2 lb

## 2024-06-17 DIAGNOSIS — N898 Other specified noninflammatory disorders of vagina: Secondary | ICD-10-CM | POA: Diagnosis not present

## 2024-06-17 DIAGNOSIS — Z1231 Encounter for screening mammogram for malignant neoplasm of breast: Secondary | ICD-10-CM

## 2024-06-17 DIAGNOSIS — Z0130 Encounter for examination of blood pressure without abnormal findings: Secondary | ICD-10-CM

## 2024-06-17 DIAGNOSIS — R399 Unspecified symptoms and signs involving the genitourinary system: Secondary | ICD-10-CM | POA: Diagnosis not present

## 2024-06-17 DIAGNOSIS — A6000 Herpesviral infection of urogenital system, unspecified: Secondary | ICD-10-CM

## 2024-06-17 DIAGNOSIS — J0100 Acute maxillary sinusitis, unspecified: Secondary | ICD-10-CM

## 2024-06-17 DIAGNOSIS — G4700 Insomnia, unspecified: Secondary | ICD-10-CM

## 2024-06-17 DIAGNOSIS — W57XXXD Bitten or stung by nonvenomous insect and other nonvenomous arthropods, subsequent encounter: Secondary | ICD-10-CM

## 2024-06-17 MED ORDER — ESZOPICLONE 1 MG PO TABS
1.0000 mg | ORAL_TABLET | Freq: Every evening | ORAL | 1 refills | Status: AC | PRN
Start: 2024-06-17 — End: ?

## 2024-06-17 NOTE — Progress Notes (Signed)
 Established Patient Office Visit  Subjective:  Patient ID: Theresa Walton, female    DOB: Feb 13, 1966  Age: 58 y.o. MRN: [id number]  Chief Complaint  Patient presents with   Follow-up    4 month follow up    Patient seen today for her 4 month follow up. She reports since being on antibiotics for 4 days she is still fatigued and not back to her baseline. She reports sinus congestion but denies fever, chills, runny nose, discolored sputum. She does report improvement of the ear bug bite, endorses swelling has reduced with the steroid, benadryl and icing the site.   Patient reports that she went without Valtrex  through the weekend and her outbreak was not controlled and caused an open sore. She is still got remaining doses of her Levaquin . She does not take antiviral suppression therapy and reports this outbreak has been the first one in over a year but endorses increased stress which is a trigger for her.  She has been seeing an urogyn who has recently done botox  injections to treat her over active bladder symptoms. She is due for her mammogram in Oct of this year. We will go ahead and order a mammogram. She has her initial Endocrinologist appointment next week and was encouraged to keep that appointment to discuss her abnormal holistic panel results.    No other concerns at this time.   Past Medical History:  Diagnosis Date   Basal cell carcinoma (BCC) of jawline 06/26/2018   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HCC)    Genital herpes    Hypothyroidism    Seasonal allergies    Syncope and collapse    Thyroid  disease    hypothyroidism    Past Surgical History:  Procedure Laterality Date   ABDOMINAL HYSTERECTOMY     ANTERIOR CRUCIATE LIGAMENT REPAIR Right 2012   ARTHROSCOPIC REPAIR ACL  2013   Right knee   BLADDER REPAIR     BREAST ENHANCEMENT SURGERY  1995   BREAST IMPLANT REMOVAL Bilateral 12/12/2021   COLONOSCOPY WITH PROPOFOL  N/A 08/29/2017   Procedure: COLONOSCOPY WITH PROPOFOL ;   Surgeon: Therisa Bi, MD;  Location: [facility] ENDOSCOPY;  Service: Gastroenterology;  Laterality: N/A;   CYSTOCELE REPAIR  2013   ENDOMETRIAL ABLATION  2013   NASAL SEPTUM SURGERY  1987, 1991, 2001   PARTIAL HYSTERECTOMY  2015   PARTIAL HYSTERECTOMY Bilateral 2014   RECTOPEXY  2011   TONSILECTOMY, ADENOIDECTOMY, BILATERAL MYRINGOTOMY AND TUBES  1984   TOOTH EXTRACTION      Social History   Socioeconomic History   Marital status: Divorced    Spouse name: Not on file   Number of children: Not on file   Years of education: Not on file   Highest education level: Not on file  Occupational History   Not on file  Tobacco Use   Smoking status: Former    Current packs/day: 0.00    Types: Cigarettes    Quit date: 05/15/2015    Years since quitting: 9.0   Smokeless tobacco: Never  Vaping Use   Vaping status: Never Used  Substance and Sexual Activity   Alcohol use: No    Alcohol/week: 0.0 standard drinks of alcohol   Drug use: No   Sexual activity: Not Currently    Partners: Male    Birth control/protection: Surgical  Other Topics Concern   Not on file  Social History Narrative   Not on file   Social Drivers of Health   Financial Resource Strain: Not on file  Food Insecurity: Not on file  Transportation Needs: Not on file  Physical Activity: Not on file  Stress: Not on file  Social Connections: Not on file  Intimate Partner Violence: Not on file    Family History  Problem Relation Age of Onset   Pulmonary fibrosis Mother    Healthy Sister    Healthy Brother    Healthy Sister    Lung cancer Paternal Grandmother    Arrhythmia Father    Heart disease Father    Prostate cancer Neg Hx    Bladder Cancer Neg Hx    Kidney cancer Neg Hx    Breast cancer Neg Hx     Allergies  Allergen Reactions   Cefaclor Anaphylaxis   Cephalosporins Anaphylaxis   Lubiprostone      Other reaction(s): Angioedema Tongue Swelling x 2 times.  Other reaction(s): Angioedema Tongue Swelling x  2 times.    Celecoxib    Milk (Cow)     Other reaction(s): Other (See Comments) GI Upset to milk protein   Apple Juice Nausea And Vomiting   Pseudoephedrine Palpitations    Tachycardia    Outpatient Medications Prior to Visit  Medication Sig   cyclobenzaprine  (FLEXERIL ) 5 MG tablet TAKE 1 TO 2 TABLETS BY MOUTH AT BEDTIME (Patient taking differently: Take 5 mg by mouth as needed.)   estradiol  (ESTRACE ) 0.1 MG/GM vaginal cream Place 0.5g nightly for two weeks then twice a week after   estradiol  (VIVELLE -DOT) 0.0375 MG/24HR APPLY 1 PATCH TOPICALLY TO THE SKIN 2 TIMES A WEEK   eszopiclone  (LUNESTA ) 1 MG TABS tablet Take 1 tablet (1 mg total) by mouth at bedtime as needed. for sleep   levofloxacin  (LEVAQUIN ) 500 MG tablet Take 1 tablet (500 mg total) by mouth daily for 7 doses.   levothyroxine  (SYNTHROID ) 50 MCG tablet TAKE 1 TABLET(50 MCG) BY MOUTH DAILY BEFORE BREAKFAST   methylPREDNISolone  (MEDROL  DOSEPAK) 4 MG TBPK tablet As directed   prazosin  (MINIPRESS ) 1 MG capsule TAKE 1 CAPSULE(1 MG) BY MOUTH AT BEDTIME   rosuvastatin (CRESTOR) 10 MG tablet TAKE 1 TABLET BY MOUTH EVERY DAY   valACYclovir  (VALTREX ) 500 MG tablet Take 1 tablet (500 mg total) by mouth as needed.   [DISCONTINUED] Trospium  Chloride 60 MG CP24 TAKE 1 CAPSULE(60 MG) BY MOUTH DAILY (Patient not taking: Reported on 06/17/2024)   No facility-administered medications prior to visit.    Review of Systems  Constitutional:  Positive for malaise/fatigue. Negative for chills and fever.  HENT:  Positive for congestion. Negative for sinus pain and sore throat.   Eyes: Negative.   Respiratory: Negative.  Negative for cough and shortness of breath.   Cardiovascular: Negative.  Negative for chest pain, palpitations and leg swelling.  Gastrointestinal: Negative.  Negative for abdominal pain, constipation, diarrhea, heartburn, nausea and vomiting.  Genitourinary: Negative.  Negative for dysuria and flank pain.  Musculoskeletal:  Negative.  Negative for joint pain and myalgias.  Skin: Negative.   Neurological: Negative.  Negative for dizziness and headaches.  Endo/Heme/Allergies: Negative.   Psychiatric/Behavioral: Negative.  Negative for depression and suicidal ideas. The patient is not nervous/anxious.        Objective:   BP 98/64   Pulse 78   Ht 5' 2 (1.575 m)   Wt 128 lb 3.2 oz (58.2 kg)   SpO2 98%   BMI 23.45 kg/m   Vitals:   06/17/24 1258  BP: 98/64  Pulse: 78  Height: 5' 2 (1.575 m)  Weight: 128 lb 3.2 oz (58.2  kg)  SpO2: 98%  BMI (Calculated): 23.44    Physical Exam Vitals and nursing note reviewed.  Constitutional:      Appearance: Normal appearance.  HENT:     Head: Normocephalic and atraumatic.     Nose: Nose normal.     Mouth/Throat:     Mouth: Mucous membranes are moist.     Pharynx: Oropharynx is clear.  Eyes:     Conjunctiva/sclera: Conjunctivae normal.     Pupils: Pupils are equal, round, and reactive to light.  Cardiovascular:     Rate and Rhythm: Normal rate and regular rhythm.     Pulses: Normal pulses.     Heart sounds: Normal heart sounds. No murmur heard. Pulmonary:     Effort: Pulmonary effort is normal.     Breath sounds: Normal breath sounds. No wheezing.  Abdominal:     General: Bowel sounds are normal.     Palpations: Abdomen is soft.     Tenderness: There is no abdominal tenderness. There is no right CVA tenderness or left CVA tenderness.  Musculoskeletal:        General: Normal range of motion.     Cervical back: Normal range of motion.     Right lower leg: No edema.     Left lower leg: No edema.  Skin:    General: Skin is warm and dry.  Neurological:     General: No focal deficit present.     Mental Status: She is alert and oriented to person, place, and time.  Psychiatric:        Mood and Affect: Mood normal.        Behavior: Behavior normal.      No results found for any visits on 06/17/24.  Recent Results (from the past 2160 hours)   POCT Urinalysis Dipstick (18997)     Status: Normal   Collection Time: 05/04/24 11:43 AM  Result Value Ref Range   Color, UA yellow    Clarity, UA clear    Glucose, UA Negative Negative   Bilirubin, UA negative    Ketones, UA negative    Spec Grav, UA 1.015 1.010 - 1.025   Blood, UA negative    pH, UA 6.0 5.0 - 8.0   Protein, UA Negative Negative   Urobilinogen, UA 0.2 0.2 or 1.0 E.U./dL   Nitrite, UA negative    Leukocytes, UA Negative Negative   Appearance clear    Odor none   Urine Culture     Status: None   Collection Time: 05/04/24 11:55 AM   Specimen: Urine   UR  Result Value Ref Range   Urine Culture, Routine Final report    Organism ID, Bacteria Lactobacillus species     Comment: 10,000-25,000 colony forming units per mL Susceptibility not normally performed on this organism.   POCT URINALYSIS DIP (CLINITEK)     Status: None   Collection Time: 05/27/24 12:38 PM  Result Value Ref Range   Color, UA yellow yellow   Clarity, UA clear clear   Glucose, UA negative negative mg/dL   Bilirubin, UA negative negative   Ketones, POC UA negative negative mg/dL   Spec Grav, UA 8.989 8.989 - 1.025   Blood, UA negative negative   pH, UA 7.0 5.0 - 8.0   POC PROTEIN,UA negative negative, trace   Urobilinogen, UA 0.2 0.2 or 1.0 E.U./dL   Nitrite, UA Negative Negative   Leukocytes, UA Negative Negative  POC Urinalysis Dipstick OB     Status:  Abnormal   Collection Time: 06/14/24 11:48 AM  Result Value Ref Range   Color, UA     Clarity, UA     Glucose, UA Negative Negative   Bilirubin, UA negative    Ketones, UA negative    Spec Grav, UA >=1.030 (A) 1.010 - 1.025   Blood, UA negative    pH, UA 6.5 5.0 - 8.0   POC,PROTEIN,UA Negative Negative, Trace, Small (1+), Moderate (2+), Large (3+), 4+   Urobilinogen, UA 0.2 0.2 or 1.0 E.U./dL   Nitrite, UA negative    Leukocytes, UA Negative Negative   Appearance     Odor        Assessment & Plan:  Continue taking medications  as prescribed. Follow up if symptoms of sinus infection persist once finishing Levaquin  or begin to worsen.Keep perineal area as clean and dry as possible while herpes lesion heals. Bug bite on ear has resolved. Ordered routine mammogram screening.  Problem List Items Addressed This Visit     Breast cancer screening by mammogram   Relevant Orders   MM 3D SCREENING MAMMOGRAM BILATERAL BREAST   Urinary tract infection symptoms   Recurrent herpes simplex - Primary   Acute non-recurrent maxillary sinusitis   Bug bite   Vaginal irritation   Other Visit Diagnoses       Mixed insomnia           Return in about 4 months (around 10/17/2024).   Total time spent: 25 minutes  FERNAND FREDY RAMAN, MD  06/17/2024   This document may have been prepared by [facility] Voice Recognition software and as such may include unintentional dictation errors.

## 2024-06-17 NOTE — Telephone Encounter (Signed)
 Patient left VM that she forgot to mention at her appointment today to ask if you would send her in a prescription for Lunesta . Please advise.

## 2024-06-22 NOTE — Telephone Encounter (Signed)
 Lunesta  was sent in on 8/21

## 2024-06-26 ENCOUNTER — Other Ambulatory Visit: Payer: Self-pay | Admitting: Internal Medicine

## 2024-06-26 DIAGNOSIS — J3081 Allergic rhinitis due to animal (cat) (dog) hair and dander: Secondary | ICD-10-CM

## 2024-07-01 ENCOUNTER — Ambulatory Visit (INDEPENDENT_AMBULATORY_CARE_PROVIDER_SITE_OTHER): Admitting: Obstetrics & Gynecology

## 2024-07-01 ENCOUNTER — Encounter: Payer: Self-pay | Admitting: Obstetrics & Gynecology

## 2024-07-01 VITALS — BP 91/63 | HR 82 | Wt 128.0 lb

## 2024-07-01 DIAGNOSIS — N9489 Other specified conditions associated with female genital organs and menstrual cycle: Secondary | ICD-10-CM

## 2024-07-01 MED ORDER — CLOBETASOL PROPIONATE 0.05 % EX CREA: 1.0000 | TOPICAL_CREAM | Freq: Every day | CUTANEOUS | 0 refills | Status: AC

## 2024-07-01 NOTE — Progress Notes (Signed)
 GYNECOLOGY OFFICE VISIT NOTE  History:  Theresa Walton is a 58 y.o. G0P0000 here today for continued evaluation of pinching feeling of vulva.  Has been evaluated by her urogynecologist, no etiology found. Patient is on topical and systemic estrogen.  Pain is exacerbated by wearing jeans, not ameliorated by any intervention thus far but estrogen cream did help a little.    Not sexually active, she denies any abnormal vaginal discharge, bleeding, or other concerns.   Past Medical History:  Diagnosis Date   Basal cell carcinoma (BCC) of jawline 06/26/2018   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HCC)    Genital herpes    Hypothyroidism    Seasonal allergies    Syncope and collapse    Thyroid  disease    hypothyroidism    Past Surgical History:  Procedure Laterality Date   ABDOMINAL HYSTERECTOMY     ANTERIOR CRUCIATE LIGAMENT REPAIR Right 2012   ARTHROSCOPIC REPAIR ACL  2013   Right knee   BLADDER REPAIR     BREAST ENHANCEMENT SURGERY  1995   BREAST IMPLANT REMOVAL Bilateral 12/12/2021   COLONOSCOPY WITH PROPOFOL  N/A 08/29/2017   Procedure: COLONOSCOPY WITH PROPOFOL ;  Surgeon: Therisa Bi, MD;  Location[facility] END[facility]COPY;  Service: Gastroenterology;  Laterality: N/A;   CYSTOCELE REPAIR  2013   ENDOMETRIAL ABLATION  2013   NASAL SEPTUM SURGERY  1987, 1991, 2001   PARTIAL HYSTERECTOMY  2015   PARTIAL HYSTERECTOMY Bilateral 2014   RECTOPEXY  2011   TONSILECTOMY, ADENOIDECTOMY, BILATERAL MYRINGOTOMY AND TUBES  1984   TOOTH EXTRACTION     The following portions of the patient's history were reviewed and updated as appropriate: allergies, current medications, past family history, past medical history, past social history, past surgical history and problem list.   Review of Systems:  Pertinent items noted in HPI and remainder of comprehensive ROS otherwise negative.  Physical Exam:  BP 91/63   Pulse 82   Wt 128 lb (58.1 kg)   BMI 23.41 kg/m  CONSTITUTIONAL: Well-developed, well-nourished  female in no acute distress.  SKIN: No rash noted. Not diaphoretic. No erythema. No pallor. MUSCULOSKELETAL: Normal range of motion. No edema noted. NEUROLOGIC: Alert and oriented to person, place, and time. Normal muscle tone coordination. No cranial nerve deficit noted on observation. PSYCHIATRIC: Normal mood and affect. Normal behavior. Normal judgment and thought content. CARDIOVASCULAR: Normal heart rate noted RESPIRATORY: Effort and breath sounds normal, no problems with respiration noted ABDOMEN: No masses or other overt distention noted on observation. No tenderness.   PELVIC: Normal appearing external genitalia with mild atrophy; normal urethral meatus.  Patient to upper portion of her left labium minus.  No current pain when area is palpated.  No discoloration, changes in architecture.  Looks exactly like the left side.  Performed in the presence of a chaperone. Physical Exam Genitourinary:     Genitourinary Comments: Region of pinched nerve sensation         Assessment and Plan:     1. Labial pain (Primary) Unsure etiology, could be a hypersensitive nerve in this region.  No signs of any infection or inflammation, however I did suggest a trial of Clobetasol  to see if this helps. If pain worsens, may have to biopsy area but no concerning characteristics seen. - clobetasol  cream (TEMOVATE ) 0.05 %; Apply 1 Application topically daily. Apply to affected area daily for two weeks, then use every other day.  Dispense: 15 g; Refill: 0  Routine preventative health maintenance measures emphasized. Please refer to After  Visit Summary for other counseling recommendations.   Return for any gynecologic concerns.    I spent 40 minutes dedicated to the care of this patient including pre-visit review of records, face to face time with the patient discussing her conditions and treatments, post visit ordering of medications and appropriate tests or procedures, coordinating care and documenting  this visit encounter.    GLORIS HUGGER, MD, FACOG Obstetrician & Gynecologist, [facility] for [employer], [facility]

## 2024-07-06 ENCOUNTER — Other Ambulatory Visit: Payer: Self-pay | Admitting: Internal Medicine

## 2024-07-06 ENCOUNTER — Encounter: Payer: Self-pay | Admitting: Internal Medicine

## 2024-07-06 DIAGNOSIS — E039 Hypothyroidism, unspecified: Secondary | ICD-10-CM

## 2024-07-09 ENCOUNTER — Other Ambulatory Visit

## 2024-07-10 LAB — TSH+T4F+T3FREE
Free T4: 1.08 ng/dL (ref 0.82–1.77)
T3, Free: 2.7 pg/mL (ref 2.0–4.4)
TSH: 1.95 u[IU]/mL (ref 0.450–4.500)

## 2024-07-12 ENCOUNTER — Ambulatory Visit: Payer: Self-pay | Admitting: Internal Medicine

## 2024-07-13 NOTE — Progress Notes (Signed)
Pt informed

## 2024-07-23 ENCOUNTER — Ambulatory Visit: Admitting: Family Medicine

## 2024-07-27 ENCOUNTER — Other Ambulatory Visit: Payer: Self-pay | Admitting: Internal Medicine

## 2024-07-27 ENCOUNTER — Encounter: Payer: Self-pay | Admitting: Internal Medicine

## 2024-07-27 DIAGNOSIS — E782 Mixed hyperlipidemia: Secondary | ICD-10-CM

## 2024-08-03 ENCOUNTER — Ambulatory Visit
Admission: RE | Admit: 2024-08-03 | Discharge: 2024-08-03 | Disposition: A | Discharge status: Home / Self Care | Payer: Self-pay | Source: Ambulatory Visit | Attending: Internal Medicine | Admitting: Internal Medicine

## 2024-08-03 ENCOUNTER — Ambulatory Visit: Payer: Self-pay | Admitting: Internal Medicine

## 2024-08-03 DIAGNOSIS — E782 Mixed hyperlipidemia: Secondary | ICD-10-CM | POA: Insufficient documentation

## 2024-08-04 ENCOUNTER — Ambulatory Visit: Admitting: Obstetrics and Gynecology

## 2024-08-09 ENCOUNTER — Other Ambulatory Visit: Payer: Self-pay | Admitting: *Deleted

## 2024-08-09 DIAGNOSIS — N959 Unspecified menopausal and perimenopausal disorder: Secondary | ICD-10-CM

## 2024-08-09 MED ORDER — ESTRADIOL 0.025 MG/24HR TD PTTW: 1.0000 | MEDICATED_PATCH | TRANSDERMAL | 2 refills | Status: DC

## 2024-08-11 ENCOUNTER — Ambulatory Visit
Admission: RE | Admit: 2024-08-11 | Discharge: 2024-08-11 | Disposition: A | Discharge status: Home / Self Care | Source: Ambulatory Visit | Attending: Internal Medicine | Admitting: Internal Medicine

## 2024-08-11 DIAGNOSIS — Z1231 Encounter for screening mammogram for malignant neoplasm of breast: Secondary | ICD-10-CM | POA: Insufficient documentation

## 2024-08-28 ENCOUNTER — Telehealth: Admitting: Emergency Medicine

## 2024-08-28 DIAGNOSIS — J3489 Other specified disorders of nose and nasal sinuses: Secondary | ICD-10-CM

## 2024-08-28 MED ORDER — DOXYCYCLINE HYCLATE 100 MG PO TABS: 100.0000 mg | ORAL_TABLET | Freq: Two times a day (BID) | ORAL | 0 refills | Status: AC

## 2024-08-28 NOTE — Patient Instructions (Signed)
 Theresa Walton, thank you for joining Jon CHRISTELLA Belt, NP for today's virtual visit.  While this provider is not your primary care provider (PCP), if your PCP is located in our provider database this encounter information will be shared with them immediately following your visit.   A [facility] MyChart account gives you access to today's visit and all your visits, tests, and labs performed at [facility]  click here if you don't have a [facility] MyChart account or go to [email or web address]  Consent: (Patient) Theresa Walton provided verbal consent for this virtual visit at the beginning of the encounter.  Current Medications:  Current Outpatient Medications:    doxycycline  (VIBRA -TABS) 100 MG tablet, Take 1 tablet (100 mg total) by mouth 2 (two) times daily for 7 days., Disp: 14 tablet, Rfl: 0   clobetasol  cream (TEMOVATE ) 0.05 %, Apply 1 Application topically daily. Apply to affected area daily for two weeks, then use every other day., Disp: 15 g, Rfl: 0   cyclobenzaprine  (FLEXERIL ) 5 MG tablet, TAKE 1 TO 2 TABLETS BY MOUTH AT BEDTIME (Patient taking differently: Take 5 mg by mouth as needed.), Disp: 30 tablet, Rfl: 0   estradiol  (ESTRACE ) 0.1 MG/GM vaginal cream, Place 0.5g nightly for two weeks then twice a week after, Disp: 30 g, Rfl: 11   estradiol  (VIVELLE -DOT) 0.025 MG/24HR, Place 1 patch onto the skin 2 (two) times a week., Disp: 8 patch, Rfl: 2   estradiol  (VIVELLE -DOT) 0.0375 MG/24HR, APPLY 1 PATCH TOPICALLY TO THE SKIN 2 TIMES A WEEK, Disp: 24 patch, Rfl: 2   eszopiclone  (LUNESTA ) 1 MG TABS tablet, Take 1 tablet (1 mg total) by mouth at bedtime as needed. for sleep, Disp: 30 tablet, Rfl: 1   levothyroxine  (SYNTHROID ) 50 MCG tablet, TAKE 1 TABLET(50 MCG) BY MOUTH DAILY BEFORE BREAKFAST, Disp: 90 tablet, Rfl: 3   methylPREDNISolone  (MEDROL  DOSEPAK) 4 MG TBPK tablet, As directed, Disp: 1 each, Rfl: 0   montelukast  (SINGULAIR ) 10 MG tablet, TAKE 1 TABLET(10 MG) BY  MOUTH DAILY, Disp: 30 tablet, Rfl: 2   prazosin  (MINIPRESS ) 1 MG capsule, TAKE 1 CAPSULE(1 MG) BY MOUTH AT BEDTIME, Disp: 90 capsule, Rfl: 2   rosuvastatin (CRESTOR) 10 MG tablet, TAKE 1 TABLET BY MOUTH EVERY DAY, Disp: 90 tablet, Rfl: 3   valACYclovir  (VALTREX ) 500 MG tablet, Take 1 tablet (500 mg total) by mouth as needed., Disp: 90 tablet, Rfl: 0   Medications ordered in this encounter:  Meds ordered this encounter  Medications   doxycycline  (VIBRA -TABS) 100 MG tablet    Sig: Take 1 tablet (100 mg total) by mouth 2 (two) times daily for 7 days.    Dispense:  14 tablet    Refill:  0     *If you need refills on other medications prior to your next appointment, please contact your pharmacy*  Follow-Up: Call back or seek an in-person evaluation if the symptoms worsen or if the condition fails to improve as anticipated.  [facility] [phone]  Other Instructions Follow up with your primary care provider if you are not getting better.    If you have been instructed to have an in-person evaluation today at a local Urgent Care facility, please use the link below. It will take you to a list of all of our available [facility], including address, phone number and hours of operation. Please do not delay care.   Urgent Cares  If you or a family member do not have  a primary care provider, use the link below to schedule a visit and establish care. When you choose a [facility] primary care physician or advanced practice provider, you gain a long-term partner in health. Find a Primary Care Provider  Learn more about [facility]'s in-office and virtual care options: [facility] - Get Care Now

## 2024-08-28 NOTE — Progress Notes (Signed)
 Virtual Visit Consent   Afsheen Amend, you are scheduled for a virtual visit with a [facility] provider today. Just as with appointments in the office, your consent must be obtained to participate. Your consent will be active for this visit and any virtual visit you may have with one of our providers in the next 365 days. If you have a MyChart account, a copy of this consent can be sent to you electronically.  As this is a virtual visit, video technology does not allow for your provider to perform a traditional examination. This may limit your provider's ability to fully assess your condition. If your provider identifies any concerns that need to be evaluated in person or the need to arrange testing (such as labs, EKG, etc.), we will make arrangements to do so. Although advances in technology are sophisticated, we cannot ensure that it will always work on either your end or our end. If the connection with a video visit is poor, the visit may have to be switched to a telephone visit. With either a video or telephone visit, we are not always able to ensure that we have a secure connection.  By engaging in this virtual visit, you consent to the provision of healthcare and authorize for your insurance to be billed (if applicable) for the services provided during this visit. Depending on your insurance coverage, you may receive a charge related to this service.  I need to obtain your verbal consent now. Are you willing to proceed with your visit today? Joseph Bihm has provided verbal consent on 08/28/2024 for a virtual visit (video or telephone). Jon CHRISTELLA Belt, NP  Date: 08/28/2024 1:22 PM   Virtual Visit via Video Note   I, Jon CHRISTELLA Belt, connected with  Theresa Walton  ([id number], February 25, 1966) on 08/28/24 at  1:15 PM EDT by a video-enabled telemedicine application and verified that I am speaking with the correct person using two identifiers.  Location: Patient: Virtual Visit Location Patient:  Home Provider: Virtual Visit Location Provider: Home Office   I discussed the limitations of evaluation and management by telemedicine and the availability of in person appointments. The patient expressed understanding and agreed to proceed.    History of Present Illness: Theresa Walton is a 58 y.o. who identifies as a female who was assigned female at birth, and is being seen today for a sinus infection. B frontal and max sinus pressure.   Congested sinuses for last week, using neti pot. Today hot and flushed and clammy, lots of post nasal drainage- it is clear but she is sure she has a bacterial infection and she is not getting all sinus congestion out. Suspects she has a low grade fever; has a headache. No nasal discharge. No coughing. Dry throat from drainage. No SOB. Did not test for covid at home. Was with a friend in the ER yesterday for friend, not herself, but they stayed away from other people and she is sure she hasn't been around anyone with covid, so hasn't tested  HPI: HPI  Problems:  Patient Active Problem List   Diagnosis Date Noted   Vaginal irritation 06/17/2024   Recurrent herpes simplex 06/14/2024   Acute non-recurrent maxillary sinusitis 06/14/2024   Bug bite 06/14/2024   Urinary tract infection symptoms 12/12/2023   GAD (generalized anxiety disorder) 12/30/2022   Other irritable bowel syndrome 12/30/2022   Chronic post-traumatic stress disorder (PTSD) 12/03/2022   Mixed hyperlipidemia 12/03/2022   Post traumatic stress disorder (PTSD) 12/03/2022   Fatigue  03/01/2022   Situational anxiety 06/29/2021   Chronic fatigue 06/29/2021   Situational stress 06/29/2021   Pain in joint, lower leg 06/28/2021   Fatigue after COVID-19 vaccination 06/13/2021   Impingement syndrome of shoulder region 08/05/2018   Knee joint effusion 08/05/2018   Old anterior cruciate ligament disruption 08/05/2018   Patellar tendonitis 08/05/2018   Basal cell carcinoma (BCC) of jawline  06/26/2018   History of pubovaginal sling 04/01/2017   Essential tremor 05/15/2016   Breast cancer screening by mammogram 11/30/2015   Allergic rhinitis 06/22/2015   B12 deficiency 06/21/2015   H/O: hysterectomy 06/21/2015   Hypertriglyceridemia 06/21/2015   Hypoglycemia 06/21/2015   Menopausal and perimenopausal disorder 06/21/2015   Vasovagal symptom 06/21/2015   Insomnia 04/19/2015   Female stress incontinence 03/24/2012   Adult hypothyroidism 08/28/2009   Vitamin D  insufficiency 08/28/2009   Chronic infection of sinus 02/01/2009   Adaptation reaction 09/09/2008   Genital herpes simplex type 1 infection 07/13/2008    Allergies:  Allergies  Allergen Reactions   Cefaclor Anaphylaxis   Cephalosporins Anaphylaxis   Lubiprostone      Other reaction(s): Angioedema Tongue Swelling x 2 times.  Other reaction(s): Angioedema Tongue Swelling x 2 times.    Celecoxib    Milk (Cow)     Other reaction(s): Other (See Comments) GI Upset to milk protein   Apple Juice Nausea And Vomiting   Pseudoephedrine Palpitations    Tachycardia   Medications:  Current Outpatient Medications:    doxycycline  (VIBRA -TABS) 100 MG tablet, Take 1 tablet (100 mg total) by mouth 2 (two) times daily for 7 days., Disp: 14 tablet, Rfl: 0   clobetasol  cream (TEMOVATE ) 0.05 %, Apply 1 Application topically daily. Apply to affected area daily for two weeks, then use every other day., Disp: 15 g, Rfl: 0   cyclobenzaprine  (FLEXERIL ) 5 MG tablet, TAKE 1 TO 2 TABLETS BY MOUTH AT BEDTIME (Patient taking differently: Take 5 mg by mouth as needed.), Disp: 30 tablet, Rfl: 0   estradiol  (ESTRACE ) 0.1 MG/GM vaginal cream, Place 0.5g nightly for two weeks then twice a week after, Disp: 30 g, Rfl: 11   estradiol  (VIVELLE -DOT) 0.025 MG/24HR, Place 1 patch onto the skin 2 (two) times a week., Disp: 8 patch, Rfl: 2   estradiol  (VIVELLE -DOT) 0.0375 MG/24HR, APPLY 1 PATCH TOPICALLY TO THE SKIN 2 TIMES A WEEK, Disp: 24 patch, Rfl:  2   eszopiclone  (LUNESTA ) 1 MG TABS tablet, Take 1 tablet (1 mg total) by mouth at bedtime as needed. for sleep, Disp: 30 tablet, Rfl: 1   levothyroxine  (SYNTHROID ) 50 MCG tablet, TAKE 1 TABLET(50 MCG) BY MOUTH DAILY BEFORE BREAKFAST, Disp: 90 tablet, Rfl: 3   methylPREDNISolone  (MEDROL  DOSEPAK) 4 MG TBPK tablet, As directed, Disp: 1 each, Rfl: 0   montelukast  (SINGULAIR ) 10 MG tablet, TAKE 1 TABLET(10 MG) BY MOUTH DAILY, Disp: 30 tablet, Rfl: 2   prazosin  (MINIPRESS ) 1 MG capsule, TAKE 1 CAPSULE(1 MG) BY MOUTH AT BEDTIME, Disp: 90 capsule, Rfl: 2   rosuvastatin (CRESTOR) 10 MG tablet, TAKE 1 TABLET BY MOUTH EVERY DAY, Disp: 90 tablet, Rfl: 3   valACYclovir  (VALTREX ) 500 MG tablet, Take 1 tablet (500 mg total) by mouth as needed., Disp: 90 tablet, Rfl: 0  Observations/Objective: Patient is well-developed, well-nourished in no acute distress.  Resting comfortably  at home.  Head is normocephalic, atraumatic.  No labored breathing.  Speech is clear and coherent with logical content.  Patient is alert and oriented at baseline.    Assessment and  Plan: 1. Sinus pressure (Primary)  Based on sx, I am not sure pt has a bacterial sinus infection. However, she has been sick >1 week and is getting worse. Will try empiric tx.   Follow Up Instructions: I discussed the assessment and treatment plan with the patient. The patient was provided an opportunity to ask questions and all were answered. The patient agreed with the plan and demonstrated an understanding of the instructions.  A copy of instructions were sent to the patient via MyChart unless otherwise noted below.   The patient was advised to call back or seek an in-person evaluation if the symptoms worsen or if the condition fails to improve as anticipated.    Jon CHRISTELLA Belt, NP

## 2024-09-02 ENCOUNTER — Ambulatory Visit (INDEPENDENT_AMBULATORY_CARE_PROVIDER_SITE_OTHER): Admitting: Family Medicine

## 2024-09-02 VITALS — BP 108/71 | HR 70 | Ht 62.0 in | Wt 133.0 lb

## 2024-09-02 DIAGNOSIS — M62838 Other muscle spasm: Secondary | ICD-10-CM

## 2024-09-02 DIAGNOSIS — N959 Unspecified menopausal and perimenopausal disorder: Secondary | ICD-10-CM | POA: Diagnosis not present

## 2024-09-02 DIAGNOSIS — Z01419 Encounter for gynecological examination (general) (routine) without abnormal findings: Secondary | ICD-10-CM

## 2024-09-02 DIAGNOSIS — Z1331 Encounter for screening for depression: Secondary | ICD-10-CM | POA: Diagnosis not present

## 2024-09-02 MED ORDER — ESTRADIOL 0.0375 MG/24HR TD PTTW
1.0000 | MEDICATED_PATCH | TRANSDERMAL | 2 refills | Status: AC
Start: 2024-09-02 — End: ?

## 2024-09-02 MED ORDER — CYCLOBENZAPRINE HCL 5 MG PO TABS: 5.0000 mg | ORAL_TABLET | ORAL | 3 refills | Status: DC | PRN

## 2024-09-02 NOTE — Progress Notes (Signed)
 Patient presents for Annual.  Hysterectomy  Mammogram: Up to date: 08/11/2024 STD Screening: Declines Flu Vaccine : Declines  CC: Hot Flashes have returned wants to discuss medication

## 2024-09-03 ENCOUNTER — Ambulatory Visit: Admitting: Obstetrics and Gynecology

## 2024-09-04 DIAGNOSIS — M62838 Other muscle spasm: Secondary | ICD-10-CM | POA: Insufficient documentation

## 2024-09-04 NOTE — Progress Notes (Signed)
 Subjective:     Theresa Walton is a 58 y.o. female and is here for a comprehensive physical exam. The patient reports problems - having hot flashes. May have requested refill of wrong dose of Vivelle .   The following portions of the patient's history were reviewed and updated as appropriate: allergies, current medications, past family history, past medical history, past social history, past surgical history, and problem list.  Review of Systems Pertinent items noted in HPI and remainder of comprehensive ROS otherwise negative.   Objective:    BP 108/71   Pulse 70   Ht 5' 2 (1.575 m)   Wt 133 lb (60.3 kg)   BMI 24.33 kg/m  General appearance: alert, cooperative, and appears stated age Head: Normocephalic, without obvious abnormality, atraumatic Neck: supple, symmetrical, trachea midline Lungs: normal effort Heart: regular rate and rhythm Abdomen: soft, non-tender; bowel sounds normal; no masses,  no organomegaly Skin: Skin color, texture, turgor normal. No rashes or lesions Neurologic: Grossly normal       09/02/2024    3:43 PM 07/30/2023    1:44 PM  GAD 7 : Generalized Anxiety Score  Nervous, Anxious, on Edge 0 0  Control/stop worrying 0 0  Worry too much - different things 0 0  Trouble relaxing 0 0  Restless 0 0  Easily annoyed or irritable 0 0  Afraid - awful might happen 0 0  Total GAD 7 Score 0 0  Anxiety Difficulty Not difficult at all Not difficult at all    [facility] Visit [facility] 09/02/2024 in [facility]  PHQ-9 Total Score 1    Assessment:    Healthy female exam.      Plan:   Problem List Items Addressed This Visit       Unprioritized   Menopausal and perimenopausal disorder   Resumed her Vivelle  at 0.0375      Relevant Medications   estradiol  (VIVELLE -DOT) 0.0375 MG/24HR   Muscle spasms of neck   Refilled her flexeril       Relevant Medications   cyclobenzaprine  (FLEXERIL ) 5 MG tablet    Other Visit Diagnoses       Encounter for gynecological examination without abnormal finding    -  Primary   PHQ 9 and GAD 7 reviewed, mammogram up to date.      Return in 1 year (on 09/02/2025).    See After Visit Summary for Counseling Recommendations

## 2024-09-04 NOTE — Assessment & Plan Note (Signed)
 Resumed her Vivelle  at 0.0375

## 2024-09-04 NOTE — Assessment & Plan Note (Signed)
Refilled her flexeril

## 2024-09-06 ENCOUNTER — Other Ambulatory Visit: Payer: Self-pay | Admitting: *Deleted

## 2024-09-06 DIAGNOSIS — M62838 Other muscle spasm: Secondary | ICD-10-CM

## 2024-09-06 MED ORDER — CYCLOBENZAPRINE HCL 5 MG PO TABS: 5.0000 mg | ORAL_TABLET | Freq: Every day | ORAL | 3 refills | Status: AC

## 2024-09-06 NOTE — Progress Notes (Signed)
 [facility] faxed over dosing frequency request, RX updated and resent

## 2024-09-14 ENCOUNTER — Encounter: Payer: Self-pay | Admitting: Obstetrics and Gynecology

## 2024-09-14 ENCOUNTER — Ambulatory Visit: Admitting: Obstetrics and Gynecology

## 2024-09-14 VITALS — BP 113/77 | HR 80

## 2024-09-14 DIAGNOSIS — N3281 Overactive bladder: Secondary | ICD-10-CM | POA: Diagnosis not present

## 2024-09-14 NOTE — Progress Notes (Signed)
 [facility] Return Visit  SUBJECTIVE  History of Present Illness: Theresa Walton is a 58 y.o. female seen in follow-up for OAB. Underwent intravesical botox  05/27/24.  She does not report any UTI symptoms. Feels that she is emptying her bladder well. Botox  has decreased her urgency and frequency and she is not having any leakage.   Past Medical History: Patient  has a past medical history of Basal cell carcinoma (BCC) of jawline (06/26/2018), Connective tissue disorder, Genital herpes, Hypothyroidism, Seasonal allergies, Syncope and collapse, and Thyroid  disease.   Past Surgical History: She  has a past surgical history that includes Tonsilectomy, adenoidectomy, bilateral myringotomy and tubes (1984); Nasal septum surgery (1987, 1991, 2001); Breast enhancement surgery (1995); Rectopexy (2011); Cystocele repair (2013); Endometrial ablation (2013); Arthroscopic repair ACL (2013); Partial hysterectomy (2015); Abdominal hysterectomy; Partial hysterectomy (Bilateral, 2014); Anterior cruciate ligament repair (Right, 2012); Bladder repair; Colonoscopy with propofol  (N/A, 08/29/2017); Tooth extraction; and Breast implant removal (Bilateral, 12/12/2021).   Medications: She has a current medication list which includes the following prescription(s): clobetasol  cream, cyclobenzaprine , estradiol , estradiol , eszopiclone , levothyroxine , prazosin , rosuvastatin, and valacyclovir .   Allergies: Patient is allergic to cefaclor, cephalosporins, lubiprostone , celecoxib, milk (cow), apple juice, and pseudoephedrine.   Social History: Patient  reports that she quit smoking about 9 years ago. Her smoking use included cigarettes. She has never used smokeless tobacco. She reports that she does not drink alcohol and does not use drugs.      OBJECTIVE     Physical Exam: Vitals:   09/14/24 1512  BP: 113/77  Pulse: 80   Gen: No apparent distress, A&O x 3.  Detailed Urogynecologic Evaluation:   deferred   Post Void Residual - 09/14/24 1522       Post Void Residual   Post Void Residual 73 mL            ASSESSMENT AND PLAN    Ms. Czaplicki is a 58 y.o. with:  1. Overactive bladder      - Continue vaginal estrogen twice a week.  - Has responded well to botox . Will plan for next injection at end of Jan/ early Feb (6 months)  Rosaline LOISE Caper, MD  Time spent: I spent 17 minutes dedicated to the care of this patient on the date of this encounter to include pre-visit review of records, face-to-face time with the patient and post visit documentation.

## 2024-10-02 ENCOUNTER — Other Ambulatory Visit: Payer: Self-pay | Admitting: Internal Medicine

## 2024-10-02 DIAGNOSIS — F4312 Post-traumatic stress disorder, chronic: Secondary | ICD-10-CM

## 2024-10-11 ENCOUNTER — Other Ambulatory Visit: Payer: Self-pay | Admitting: Internal Medicine

## 2024-10-11 DIAGNOSIS — F4312 Post-traumatic stress disorder, chronic: Secondary | ICD-10-CM

## 2024-10-11 MED ORDER — PRAZOSIN HCL 1 MG PO CAPS: 1.0000 mg | ORAL_CAPSULE | Freq: Every day | ORAL | 2 refills | Status: AC

## 2024-10-18 ENCOUNTER — Ambulatory Visit: Admitting: Internal Medicine

## 2024-10-25 ENCOUNTER — Telehealth: Payer: Self-pay

## 2024-10-29 NOTE — Telephone Encounter (Signed)
 [facility] home delivery was notified.

## 2024-11-01 ENCOUNTER — Encounter: Payer: Self-pay | Admitting: Obstetrics and Gynecology

## 2024-11-08 ENCOUNTER — Encounter: Payer: Self-pay | Admitting: *Deleted

## 2024-11-23 ENCOUNTER — Encounter: Payer: Self-pay | Admitting: Obstetrics and Gynecology

## 2024-12-02 ENCOUNTER — Ambulatory Visit: Admitting: Obstetrics and Gynecology

## 2024-12-02 ENCOUNTER — Encounter: Payer: Self-pay | Admitting: Obstetrics and Gynecology

## 2024-12-02 VITALS — BP 105/68 | HR 81

## 2024-12-02 DIAGNOSIS — N3281 Overactive bladder: Secondary | ICD-10-CM

## 2024-12-02 LAB — POCT URINALYSIS DIP (CLINITEK)
Bilirubin, UA: NEGATIVE
Blood, UA: NEGATIVE
Glucose, UA: NEGATIVE mg/dL
Ketones, POC UA: NEGATIVE mg/dL
Leukocytes, UA: NEGATIVE
Nitrite, UA: NEGATIVE
POC PROTEIN,UA: NEGATIVE
Spec Grav, UA: <=1.005 — AB
Urobilinogen, UA: 0.2 U/dL
pH, UA: 5.5

## 2024-12-02 MED ORDER — LIDOCAINE HCL URETHRAL/MUCOSAL 2 % EX GEL
1.0000 | Freq: Once | CUTANEOUS | Status: AC
  Administered 2024-12-02: 1 via URETHRAL

## 2024-12-02 MED ORDER — CIPROFLOXACIN HCL 500 MG PO TABS
500.0000 mg | ORAL_TABLET | Freq: Once | ORAL | Status: AC
  Administered 2024-12-02: 500 mg via ORAL

## 2024-12-02 MED ORDER — LIDOCAINE HCL 2 % IJ SOLN
50.0000 mL | Freq: Once | INTRAMUSCULAR | Status: AC
  Administered 2024-12-02: 1000 mg

## 2024-12-02 MED ORDER — ONABOTULINUMTOXINA 100 UNITS IJ SOLR
100.0000 [IU] | Freq: Once | INTRAMUSCULAR | Status: AC
  Administered 2024-12-02: 100 [IU] via INTRAMUSCULAR

## 2024-12-02 NOTE — Patient Instructions (Signed)
Taking Care of Yourself after Urodynamics, Cystoscopy, Bulkamid Injection, or Botox Injection   Drink plenty of water for a day or two following your procedure. Try to have about 8 ounces (one cup) at a time, and do this 6 times or more per day unless you have fluid restrictitons AVOID irritative beverages such as coffee, tea, soda, alcoholic or citrus drinks for a day or two, as this may cause burning with urination.  For the first 1-2 days after the procedure, your urine may be pink or red in color. You may have some blood in your urine as a normal side effect of the procedure. Large amounts of bleeding or difficulty urinating are NOT normal. Call the nurse line if this happens or go to the nearest Emergency Room if the bleeding is heavy or you cannot urinate at all and it is after hours.  You may experience some discomfort or a burning sensation with urination after having this procedure. You can use over the counter Azo or pyridium to help with burning and follow the instructions on the packaging. If it does not improve within 1-2 days, or other symptoms appear (fever, chills, or difficulty urinating) call the office to speak to a nurse.  You may return to normal daily activities such as work, school, driving, exercising and housework on the day of the procedure.

## 2024-12-02 NOTE — Addendum Note (Signed)
 Addended by: MARILYNNE ROSALINE SAILOR on: 12/02/2024 11:14 AM   Modules accepted: Orders

## 2024-12-02 NOTE — Progress Notes (Signed)
 Intravesical Botox  Procedure:  59 y.o. yo F with OAB presenting today for intravesical botox . Last injection was 05/27/24  Vitals:   12/02/24 1009  BP: 105/68  Pulse: 81    Results for orders placed or performed in visit on 12/02/24 (from the past 24 hours)  POCT URINALYSIS DIP (CLINITEK)     Status: Abnormal   Collection Time: 12/02/24 10:36 AM  Result Value Ref Range   Color, UA yellow yellow   Clarity, UA clear clear   Glucose, UA negative negative mg/dL   Bilirubin, UA negative negative   Ketones, POC UA negative negative mg/dL   Spec Grav, UA <=8.994 (A) 1.010 - 1.025   Blood, UA negative negative   pH, UA 5.5 5.0 - 8.0   POC PROTEIN,UA negative negative, trace   Urobilinogen, UA 0.2 0.2 or 1.0 E.U./dL   Nitrite, UA Negative Negative   Leukocytes, UA Negative Negative    Prior to the procedure, the patient took ciprofloxacin  for antibiotic prophylaxis. Time out was performed. The bladder was catherized and 50 ml of 2% lidocaine  was placed in the bladder and 10 ml of 2% lidocaine  jelly placed in the urethra. Cystoscopy was performed with flexible cystoscope. Bladder mucosa was noted to be within normal limits with trabeculations present. A total of 10 ml / 100 units of Botox  A,  Lot # [id number] Exp 02/2026  was injected in the detrusor muscle via 5 injections of 2ml each. These were spaced about 1 cm apart. Care was taken to avoid the ureteral orifices and the trigone. Patient tolerated the procedure well.  Impression: 59 y.o. 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 Patient tolerated procedure well.  Plan: Post-procedure instructions given regarding bleeding, infection, urinary retention.  Self-catheterization teaching was previously performed.   Patient will follow up in 4 weeks All questions answered.  Rosaline LOISE Caper, MD

## 2024-12-31 ENCOUNTER — Ambulatory Visit: Admitting: Obstetrics and Gynecology

## 2025-02-10 ENCOUNTER — Ambulatory Visit: Admitting: Obstetrics and Gynecology

## 2025-03-15 ENCOUNTER — Ambulatory Visit: Admitting: Obstetrics and Gynecology
